# Patient Record
Sex: Female | Born: 1965 | Race: Black or African American | Hispanic: No | Marital: Single | Smoking: Never smoker
Health system: Southern US, Community
[De-identification: ages and names within clinical notes are randomized; demographics above are authoritative.]

## PROBLEM LIST (undated history)

## (undated) DIAGNOSIS — E669 Obesity, unspecified: Secondary | ICD-10-CM

## (undated) DIAGNOSIS — M1990 Unspecified osteoarthritis, unspecified site: Secondary | ICD-10-CM

## (undated) DIAGNOSIS — E039 Hypothyroidism, unspecified: Secondary | ICD-10-CM

## (undated) DIAGNOSIS — D509 Iron deficiency anemia, unspecified: Secondary | ICD-10-CM

## (undated) DIAGNOSIS — T7840XA Allergy, unspecified, initial encounter: Secondary | ICD-10-CM

## (undated) DIAGNOSIS — F329 Major depressive disorder, single episode, unspecified: Secondary | ICD-10-CM

## (undated) DIAGNOSIS — R002 Palpitations: Secondary | ICD-10-CM

## (undated) DIAGNOSIS — F32A Depression, unspecified: Secondary | ICD-10-CM

## (undated) DIAGNOSIS — G4733 Obstructive sleep apnea (adult) (pediatric): Secondary | ICD-10-CM

## (undated) DIAGNOSIS — I10 Essential (primary) hypertension: Secondary | ICD-10-CM

## (undated) DIAGNOSIS — E079 Disorder of thyroid, unspecified: Secondary | ICD-10-CM

## (undated) DIAGNOSIS — F419 Anxiety disorder, unspecified: Secondary | ICD-10-CM

## (undated) DIAGNOSIS — E119 Type 2 diabetes mellitus without complications: Secondary | ICD-10-CM

## (undated) DIAGNOSIS — I609 Nontraumatic subarachnoid hemorrhage, unspecified: Secondary | ICD-10-CM

## (undated) DIAGNOSIS — Z5189 Encounter for other specified aftercare: Secondary | ICD-10-CM

## (undated) DIAGNOSIS — E559 Vitamin D deficiency, unspecified: Secondary | ICD-10-CM

## (undated) HISTORY — DX: Obesity, unspecified: E66.9

## (undated) HISTORY — PX: BRAIN SURGERY: SHX531

## (undated) HISTORY — DX: Type 2 diabetes mellitus without complications: E11.9

## (undated) HISTORY — DX: Unspecified osteoarthritis, unspecified site: M19.90

## (undated) HISTORY — PX: ABDOMINAL HYSTERECTOMY: SHX81

## (undated) HISTORY — DX: Vitamin D deficiency, unspecified: E55.9

## (undated) HISTORY — DX: Nontraumatic subarachnoid hemorrhage, unspecified: I60.9

## (undated) HISTORY — DX: Obstructive sleep apnea (adult) (pediatric): G47.33

## (undated) HISTORY — PX: WISDOM TOOTH EXTRACTION: SHX21

## (undated) HISTORY — DX: Allergy, unspecified, initial encounter: T78.40XA

## (undated) HISTORY — DX: Palpitations: R00.2

---

## 1997-11-14 ENCOUNTER — Other Ambulatory Visit: Admission: RE | Admit: 1997-11-14 | Discharge: 1997-11-14 | Payer: Self-pay | Admitting: Obstetrics & Gynecology

## 1999-12-23 ENCOUNTER — Encounter: Payer: Self-pay | Admitting: Emergency Medicine

## 1999-12-23 ENCOUNTER — Emergency Department (HOSPITAL_COMMUNITY): Admission: EM | Admit: 1999-12-23 | Discharge: 1999-12-23 | Payer: Self-pay | Admitting: Emergency Medicine

## 2000-09-22 ENCOUNTER — Other Ambulatory Visit: Admission: RE | Admit: 2000-09-22 | Discharge: 2000-09-22 | Payer: Self-pay | Admitting: Obstetrics and Gynecology

## 2003-10-21 ENCOUNTER — Emergency Department (HOSPITAL_COMMUNITY): Admission: EM | Admit: 2003-10-21 | Discharge: 2003-10-21 | Payer: Self-pay

## 2003-11-06 ENCOUNTER — Ambulatory Visit (HOSPITAL_COMMUNITY): Admission: RE | Admit: 2003-11-06 | Discharge: 2003-11-06 | Payer: Self-pay | Admitting: Chiropractic Medicine

## 2004-05-25 ENCOUNTER — Emergency Department (HOSPITAL_COMMUNITY): Admission: EM | Admit: 2004-05-25 | Discharge: 2004-05-25 | Payer: Self-pay | Admitting: Family Medicine

## 2005-02-04 ENCOUNTER — Other Ambulatory Visit: Admission: RE | Admit: 2005-02-04 | Discharge: 2005-02-04 | Payer: Self-pay | Admitting: Obstetrics and Gynecology

## 2007-01-08 ENCOUNTER — Emergency Department (HOSPITAL_COMMUNITY): Admission: EM | Admit: 2007-01-08 | Discharge: 2007-01-08 | Payer: Self-pay | Admitting: Emergency Medicine

## 2012-06-24 ENCOUNTER — Emergency Department (HOSPITAL_COMMUNITY)
Admission: EM | Admit: 2012-06-24 | Discharge: 2012-06-24 | Disposition: A | Discharge status: Home / Self Care | Payer: BC Managed Care – PPO | Source: Home / Self Care | Attending: Emergency Medicine | Admitting: Emergency Medicine

## 2012-06-24 ENCOUNTER — Encounter (HOSPITAL_COMMUNITY): Payer: Self-pay | Admitting: Emergency Medicine

## 2012-06-24 DIAGNOSIS — J209 Acute bronchitis, unspecified: Secondary | ICD-10-CM

## 2012-06-24 HISTORY — DX: Disorder of thyroid, unspecified: E07.9

## 2012-06-24 HISTORY — DX: Essential (primary) hypertension: I10

## 2012-06-24 HISTORY — DX: Major depressive disorder, single episode, unspecified: F32.9

## 2012-06-24 HISTORY — DX: Anxiety disorder, unspecified: F41.9

## 2012-06-24 HISTORY — DX: Depression, unspecified: F32.A

## 2012-06-24 MED ORDER — ALBUTEROL SULFATE HFA 108 (90 BASE) MCG/ACT IN AERS: 1.0000 | INHALATION_SPRAY | Freq: Four times a day (QID) | RESPIRATORY_TRACT | Status: DC | PRN

## 2012-06-24 MED ORDER — AZITHROMYCIN 250 MG PO TABS: ORAL_TABLET | ORAL | Status: DC

## 2012-06-24 MED ORDER — PREDNISONE 20 MG PO TABS: 20.0000 mg | ORAL_TABLET | Freq: Two times a day (BID) | ORAL | Status: DC

## 2012-06-24 MED ORDER — HYDROCOD POLST-CHLORPHEN POLST 10-8 MG/5ML PO LQCR: 5.0000 mL | Freq: Two times a day (BID) | ORAL | Status: DC | PRN

## 2012-06-24 NOTE — ED Notes (Signed)
Pt c/o sinus pressure and pain. Post nasal drip. Dry nonproductive cough. Sneezing and sore throat. Pt denies fever, n/v/d. Pt has tried otc meds with no relief of symptoms. Symptoms present x 1 wk.

## 2012-06-24 NOTE — ED Notes (Signed)
Waiting discharge papers 

## 2012-06-24 NOTE — ED Provider Notes (Signed)
Chief Complaint  Patient presents with  . URI    cough dry nonproductive. sinus pressure and pain. sore throat.     History of Present Illness:   Amanda Henry  is a 47 year old female who presents with a one half week history of dry cough, chest tightness, rattly in the chest, nasal congestion, headache, postnasal drip, and sore throat. She denies any chest pain or wheezing. She's had no fever or chills. No GI symptoms. She has not been exposed to anything in particular. She has not tried any medications at home for symptom relief. She is allergic to penicillin. She takes thyroid medication and Diovan. She has hypothyroidism, hypertension, anxiety, and depression.  Review of Systems:  Other than noted above, the patient denies any of the following symptoms. Systemic:  No fever, chills, sweats, fatigue, myalgias, headache, or anorexia. Eye:  No redness, pain or drainage. ENT:  No earache, ear congestion, nasal congestion, sneezing, rhinorrhea, sinus pressure, sinus pain, post nasal drip, or sore throat. Lungs:  No cough, sputum production, wheezing, shortness of breath, or chest pain. GI:  No abdominal pain, nausea, vomiting, or diarrhea.  PMFSH:  Past medical history, family history, social history, meds, and allergies were reviewed.  Physical Exam:   Vital signs:  BP 166/95  Pulse 73  Temp 98.6 F (37 C) (Oral)  Resp 19  SpO2 95%  LMP 06/05/2012 General:  Alert, in no distress. Eye:  No conjunctival injection or drainage. Lids were normal. ENT:  TMs and canals were normal, without erythema or inflammation.  Nasal mucosa was clear and uncongested, without drainage.  Mucous membranes were moist.  Pharynx was clear, without exudate or drainage.  There were no oral ulcerations or lesions. Neck:  Supple, no adenopathy, tenderness or mass. Lungs:  No respiratory distress.  Lungs were clear to auscultation, without wheezes, rales or rhonchi.  Breath sounds were clear and equal bilaterally.   Heart:  Regular rhythm, without gallops, murmers or rubs. Skin:  Clear, warm, and dry, without rash or lesions.  Assessment:  The encounter diagnosis was Acute bronchitis.  Plan:   1.  The following meds were prescribed:   New Prescriptions   ALBUTEROL (PROVENTIL HFA;VENTOLIN HFA) 108 (90 BASE) MCG/ACT INHALER    Inhale 1-2 puffs into the lungs every 6 (six) hours as needed for wheezing.   AZITHROMYCIN (ZITHROMAX Z-PAK) 250 MG TABLET    Take as directed.   CHLORPHENIRAMINE-HYDROCODONE (TUSSIONEX) 10-8 MG/5ML LQCR    Take 5 mLs by mouth every 12 (twelve) hours as needed.   PREDNISONE (DELTASONE) 20 MG TABLET    Take 1 tablet (20 mg total) by mouth 2 (two) times daily.   2.  The patient was instructed in symptomatic care and handouts were given. 3.  The patient was told to return if becoming worse in any way, if no better in one week, and given some red flag symptoms that would indicate earlier return.   Reuben Likes, MD 06/24/12 [phone]

## 2012-09-06 ENCOUNTER — Encounter (HOSPITAL_COMMUNITY): Payer: Self-pay | Admitting: *Deleted

## 2012-09-06 ENCOUNTER — Emergency Department (HOSPITAL_COMMUNITY)
Admission: EM | Admit: 2012-09-06 | Discharge: 2012-09-06 | Disposition: A | Discharge status: Home / Self Care | Payer: BC Managed Care – PPO | Source: Home / Self Care | Attending: Emergency Medicine | Admitting: Emergency Medicine

## 2012-09-06 DIAGNOSIS — B079 Viral wart, unspecified: Secondary | ICD-10-CM

## 2012-09-06 MED ORDER — IBUPROFEN 800 MG PO TABS: 800.0000 mg | ORAL_TABLET | Freq: Three times a day (TID) | ORAL | Status: DC

## 2012-09-06 NOTE — ED Provider Notes (Signed)
Chief Complaint:   Chief Complaint  Patient presents with  . Abscess    History of Present Illness:   Amanda Henry is a 47 year old female who has had a one-week history of a wartlike lesion on the palm of her left hand at the base of her middle finger. This is somewhat itchy and tender to touch. She put some wart remover on the top, and it became necrotic. Ever since then it's been very friable and bleeds whenever she traumatized it.  Review of Systems:  Other than noted above, the patient denies any of the following symptoms: Systemic:  No fever, chills, sweats, weight loss, or fatigue. ENT:  No nasal congestion, rhinorrhea, sore throat, swelling of lips, tongue or throat. Resp:  No cough, wheezing, or shortness of breath. Skin:  No rash, itching, nodules, or suspicious lesions.  PMFSH:  Past medical history, family history, social history, meds, and allergies were reviewed. She is allergic to penicillin. She takes iron, Diovan, levothyroxine, alprazolam, and fluoxetine. She has high blood pressure, anemia, hypothyroidism, and anxiety.  Physical Exam:   Vital signs:  BP 173/73  Pulse 95  Temp(Src) 98.6 F (37 C) (Oral)  Resp 18  SpO2 95%  LMP 08/06/2012 Gen:  Alert, oriented, in no distress. ENT:  Pharynx clear, no intraoral lesions, moist mucous membranes. Lungs:  Clear to auscultation. Skin:  There is a 4 mm wart type lesion which is raised at the base of the middle finger and the palm of the left hand. Skin was otherwise clear.  Procedure Note:  Verbal informed consent was obtained from the patient.  Risks and benefits were outlined with the patient.  Patient understands and accepts these risks.  Identity of the patient was confirmed verbally and by armband.    Procedure was performed as follows:  The lesion was prepped with alcohol and Betadine and anesthetized with 5 mL of 2% Xylocaine without epinephrine. The wart was then shaved off even with the skin and the base was  scraped with a scalpel blade. It bled profusely. Electrocautery was used to stop the bleeding. Bleeding was controlled. Antibiotic ointment, a nonstick dressing, and a loose Coban dressing were applied. The patient was given instructions in wound care.  Patient tolerated the procedure well without any immediate complications.   Assessment:  The encounter diagnosis was Wart.  This appears to be a typical warts. She put the wart remover on it and has become somewhat necrotic.  Plan:   1.  The following meds were prescribed:   New Prescriptions   IBUPROFEN (ADVIL,MOTRIN) 800 MG TABLET    Take 1 tablet (800 mg total) by mouth 3 (three) times daily.   IBUPROFEN (ADVIL,MOTRIN) 800 MG TABLET    Take 1 tablet (800 mg total) by mouth 3 (three) times daily.   2.  The patient was instructed in symptomatic care and handouts were given. I told her I could not guarantee that this would cure the ward and if it came back she would need to see a dermatologist. She was given the name of Dr. Para Skeans. 3.  The patient was told to return if becoming worse in any way, if no better in 3 or 4 days, and given some red flag symptoms such as signs of infection that would indicate earlier return.     Reuben Likes, MD 09/06/12 [phone]

## 2012-09-06 NOTE — ED Notes (Signed)
Finger itching 1 week. It turned into a knot last Monday. Put wart remover on Wednesday.  On Thursday it started bleeding and has bled off and on since then.  Applied wart remover again today.

## 2013-05-24 ENCOUNTER — Ambulatory Visit (INDEPENDENT_AMBULATORY_CARE_PROVIDER_SITE_OTHER): Payer: BC Managed Care – PPO | Admitting: Family Medicine

## 2013-05-24 VITALS — BP 132/80 | HR 91 | Temp 98.0°F | Resp 18 | Ht 68.0 in | Wt 199.0 lb

## 2013-05-24 DIAGNOSIS — E039 Hypothyroidism, unspecified: Secondary | ICD-10-CM

## 2013-05-24 DIAGNOSIS — R5383 Other fatigue: Secondary | ICD-10-CM

## 2013-05-24 DIAGNOSIS — R079 Chest pain, unspecified: Secondary | ICD-10-CM

## 2013-05-24 DIAGNOSIS — D509 Iron deficiency anemia, unspecified: Secondary | ICD-10-CM

## 2013-05-24 DIAGNOSIS — R5381 Other malaise: Secondary | ICD-10-CM

## 2013-05-24 DIAGNOSIS — F418 Other specified anxiety disorders: Secondary | ICD-10-CM | POA: Insufficient documentation

## 2013-05-24 LAB — POCT CBC
GRANULOCYTE PERCENT: 57.1 % (ref 37–80)
HCT, POC: 33 % — AB (ref 37.7–47.9)
Hemoglobin: 9.6 g/dL — AB (ref 12.2–16.2)
Lymph, poc: 2.8 (ref 0.6–3.4)
MCH, POC: 23.1 pg — AB (ref 27–31.2)
MCHC: 29.1 g/dL — AB (ref 31.8–35.4)
MCV: 79.3 fL — AB (ref 80–97)
MID (CBC): 0.4 (ref 0–0.9)
MPV: 8.5 fL (ref 0–99.8)
PLATELET COUNT, POC: 451 10*3/uL — AB (ref 142–424)
POC Granulocyte: 4.2 (ref 2–6.9)
POC LYMPH %: 37.5 % (ref 10–50)
POC MID %: 5.4 % (ref 0–12)
RBC: 4.16 M/uL (ref 4.04–5.48)
RDW, POC: 17.1 %
WBC: 7.4 10*3/uL (ref 4.6–10.2)

## 2013-05-24 NOTE — Patient Instructions (Addendum)
.   Followup with your primary care doctor. You are anemic with a hemoglobin that is about 2-1/2 points low. Until you can see your doctor I recommend going ahead taking iron twice a day  Make sure you're getting enough rest  Continue your other routine medications.  Return if further problems we can help with

## 2013-05-24 NOTE — Progress Notes (Signed)
Subjective: 48 year old lady who has been feeling bad last couple of weeks. She's been a little congested and fatigue.  She is mostly just had the pain in the casts, left shoulder anteriorly, and back. 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 No nausea or vomiting. No diaphoresis. It's not having any nausea or vomiting.  Objective: No acute distress. Throat clear. Neck supple without nodes thyromegaly. Chest is clear to restriction. Heart regular without murmurs. Abdomen soft without mass or tenderness. No shoulder tenderness. No chest wall tenderness.  EKG: Nonspecific changes  Results for orders placed in visit on 05/24/13  POCT CBC      Result Value Range   WBC 7.4  4.6 - 10.2 K/uL   Lymph, poc 2.8  0.6 - 3.4   POC LYMPH PERCENT 37.5  10 - 50 %L   MID (cbc) 0.4  0 - 0.9   POC MID % 5.4  0 - 12 %M   POC Granulocyte 4.2  2 - 6.9   Granulocyte percent 57.1  37 - 80 %G   RBC 4.16  4.04 - 5.48 M/uL   Hemoglobin 9.6 (*) 12.2 - 16.2 g/dL   HCT, POC 33.0 (*) 37.7 - 47.9 %   MCV 79.3 (*) 80 - 97 fL   MCH, POC 23.1 (*) 27 - 31.2 pg   MCHC 29.1 (*) 31.8 - 35.4 g/dL   RDW, POC 17.1     Platelet Count, POC 451 (*) 142 - 424 K/uL   MPV 8.5  0 - 99.8 fL   She will speak to her primary care about the anemia. That has been a problem. She does take some iron on a regular basis and has been on it for a long time. I suggested she may be someone who needs her injections, and would probably have to see a hematologist for that but it depends on her primary cares desire

## 2013-07-06 ENCOUNTER — Ambulatory Visit (INDEPENDENT_AMBULATORY_CARE_PROVIDER_SITE_OTHER): Payer: BC Managed Care – PPO | Admitting: Physician Assistant

## 2013-07-06 VITALS — BP 116/80 | HR 88 | Temp 98.8°F | Resp 18 | Ht 68.0 in | Wt 195.0 lb

## 2013-07-06 DIAGNOSIS — J40 Bronchitis, not specified as acute or chronic: Secondary | ICD-10-CM

## 2013-07-06 DIAGNOSIS — R05 Cough: Secondary | ICD-10-CM

## 2013-07-06 DIAGNOSIS — R059 Cough, unspecified: Secondary | ICD-10-CM

## 2013-07-06 DIAGNOSIS — R51 Headache: Secondary | ICD-10-CM

## 2013-07-06 LAB — POCT INFLUENZA A/B
INFLUENZA B, POC: NEGATIVE
Influenza A, POC: NEGATIVE

## 2013-07-06 MED ORDER — HYDROCOD POLST-CHLORPHEN POLST 10-8 MG/5ML PO LQCR: 5.0000 mL | Freq: Two times a day (BID) | ORAL | Status: DC | PRN

## 2013-07-06 MED ORDER — AZITHROMYCIN 250 MG PO TABS: ORAL_TABLET | ORAL | Status: DC

## 2013-07-06 NOTE — Progress Notes (Signed)
Subjective:    Patient ID: Amanda Henry, female    DOB: [birth year], 48 y.o.   MRN: [id number]  HPI Primary Physician: Salena Saner., MD  Chief Complaint:   HPI: 48 y.o. female with history below presents with a 3 day history of chest congestion, nasal congestion, rhinorrhea, sore throat, post nasal drip, sinus pressure, sneezing, headache, fever, chills, and myalgias. She is uncertain of T max. Cough is not productive and not associated with time of day. No SOB or wheezing. Mild waxing and waning otalgia. Slightly muffled hearing. Generalized headache. She did have one episode of diarrhea the previous evening. 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 She did not receive the flu vaccine this year.    Past Medical History  Diagnosis Date  . Hypertension   . Thyroid disease   . Anxiety   . Depression      Home Meds: Prior to Admission medications   Medication Sig Start Date End Date Taking? Authorizing Provider  ALPRAZolam Duanne Moron) 1 MG tablet Take 1 mg by mouth at bedtime as needed.   Yes Historical Provider, MD  Fe Fum-FePoly-Vit C-Vit B3 (INTEGRA PO) Take by mouth daily.   Yes Historical Provider, MD  FLUoxetine (PROZAC) 40 MG capsule Take 40 mg by mouth daily.   Yes Historical Provider, MD  levothyroxine (SYNTHROID, LEVOTHROID) 137 MCG tablet Take 137 mcg by mouth daily.   Yes Historical Provider, MD  Valsartan (DIOVAN PO) Take by mouth.   Yes Historical Provider, MD    Allergies:  Allergies  Allergen Reactions  . Penicillins     History   Social History  . Marital Status: Single    Spouse Name: N/A    Number of Children: N/A  . Years of Education: N/A   Occupational History  . Not on file.   Social History Main Topics  . Smoking status: Never Smoker   . Smokeless tobacco: Not on file  . Alcohol Use: No  . Drug Use: No  . Sexual Activity: Yes    Birth Control/ Protection: Condom   Other Topics Concern  . Not on  file   Social History Narrative  . No narrative on file     Review of Systems  Constitutional: Positive for fever, chills, appetite change and fatigue.       Pushing fluids.   HENT: Positive for congestion, ear pain, hearing loss, postnasal drip, rhinorrhea, sinus pressure, sneezing and sore throat.        Mild otalgia.   Respiratory: Positive for cough. Negative for shortness of breath and wheezing.        Cough is not productive. Cough is not associated with time of day.    Gastrointestinal: Positive for diarrhea. Negative for nausea and vomiting.       Mild diarrhea the previous evening.   Musculoskeletal: Positive for myalgias.  Neurological: Positive for headaches.       Generalized headache.        Objective:   Physical Exam  Physical Exam: Blood pressure 116/80, pulse 88, temperature 98.8 F (37.1 C), temperature source Oral, resp. rate 18, height 5\' 8"  (1.727 m), weight 195 lb (88.451 kg), last menstrual period 07/01/2013, SpO2 99.00%., Body mass index is 29.66 kg/(m^2). General: Well developed, well nourished, in no acute distress. Head: Normocephalic, atraumatic, eyes without discharge, sclera non-icteric, nares are congested. Bilateral auditory canals clear, TM's are without perforation, pearly grey with reflective cone of light bilaterally. No sinus  TTP. Oral cavity moist, dentition normal. Posterior pharynx with post nasal drip and mild erythema. No peritonsillar abscess or tonsillar exudate. Uvula midline.  Neck: Supple. No thyromegaly. Full ROM. No lymphadenopathy. No nuchal rigidity.  Lungs: Coarse breath sounds bilaterally without wheezes, rales, or rhonchi. Breathing is unlabored.  Heart: RRR with S1 S2. No murmurs, rubs, or gallops appreciated. Msk:  Strength and tone normal for age. Extremities: No clubbing or cyanosis. No edema. Neuro: Alert and oriented X 3. Moves all extremities spontaneously. CNII-XII grossly in tact. Psych:  Responds to questions  appropriately with a normal affect.   Labs: Results for orders placed in visit on 07/06/13  POCT INFLUENZA A/B      Result Value Ref Range   Influenza A, POC Negative     Influenza B, POC Negative         Assessment & Plan:  48 year old female with bronchitis, cough, and headache -Azithromycin 250 MG #6 2 po first day then 1 po next 4 days no RF -Tussionex 1 tsp po q 12 hours prn cough #90 mL no RF  -Rest/fluids -RTC precautions   Christell Faith, MHS, PA-C [facility] [place], [place] [id number] [place] 07/06/2013 5:25 PM

## 2016-02-14 ENCOUNTER — Emergency Department (HOSPITAL_COMMUNITY)
Admission: EM | Admit: 2016-02-14 | Discharge: 2016-02-14 | Disposition: A | Discharge status: Home / Self Care | Payer: BC Managed Care – PPO | Attending: Physician Assistant | Admitting: Physician Assistant

## 2016-02-14 ENCOUNTER — Encounter (HOSPITAL_COMMUNITY): Payer: Self-pay

## 2016-02-14 ENCOUNTER — Emergency Department (HOSPITAL_COMMUNITY): Payer: BC Managed Care – PPO

## 2016-02-14 DIAGNOSIS — N921 Excessive and frequent menstruation with irregular cycle: Secondary | ICD-10-CM

## 2016-02-14 DIAGNOSIS — R42 Dizziness and giddiness: Secondary | ICD-10-CM | POA: Diagnosis present

## 2016-02-14 DIAGNOSIS — D649 Anemia, unspecified: Secondary | ICD-10-CM | POA: Insufficient documentation

## 2016-02-14 DIAGNOSIS — I10 Essential (primary) hypertension: Secondary | ICD-10-CM | POA: Diagnosis not present

## 2016-02-14 LAB — COMPREHENSIVE METABOLIC PANEL
ALT: 12 U/L — AB (ref 14–54)
AST: 18 U/L (ref 15–41)
Albumin: 3.8 g/dL (ref 3.5–5.0)
Alkaline Phosphatase: 78 U/L (ref 38–126)
Anion gap: 6 (ref 5–15)
BUN: 10 mg/dL (ref 6–20)
CALCIUM: 10.1 mg/dL (ref 8.9–10.3)
CHLORIDE: 105 mmol/L (ref 101–111)
CO2: 26 mmol/L (ref 22–32)
Creatinine, Ser: 0.67 mg/dL (ref 0.44–1.00)
GLUCOSE: 100 mg/dL — AB (ref 65–99)
POTASSIUM: 3.4 mmol/L — AB (ref 3.5–5.1)
SODIUM: 137 mmol/L (ref 135–145)
TOTAL PROTEIN: 8.4 g/dL — AB (ref 6.5–8.1)
Total Bilirubin: 0.2 mg/dL — ABNORMAL LOW (ref 0.3–1.2)

## 2016-02-14 LAB — CBC WITH DIFFERENTIAL/PLATELET
Basophils Absolute: 0.1 10*3/uL (ref 0.0–0.1)
Basophils Relative: 1 %
EOS ABS: 0.1 10*3/uL (ref 0.0–0.7)
Eosinophils Relative: 1 %
HCT: 23.7 % — ABNORMAL LOW (ref 36.0–46.0)
Hemoglobin: 6.7 g/dL — CL (ref 12.0–15.0)
LYMPHS ABS: 1.9 10*3/uL (ref 0.7–4.0)
Lymphocytes Relative: 20 %
MCH: 17 pg — AB (ref 26.0–34.0)
MCHC: 28.3 g/dL — ABNORMAL LOW (ref 30.0–36.0)
MCV: 60.2 fL — AB (ref 78.0–100.0)
MONO ABS: 0.6 10*3/uL (ref 0.1–1.0)
Monocytes Relative: 6 %
NEUTROS PCT: 72 %
Neutro Abs: 6.7 10*3/uL (ref 1.7–7.7)
PLATELETS: 710 10*3/uL — AB (ref 150–400)
RBC: 3.94 MIL/uL (ref 3.87–5.11)
RDW: 20 % — AB (ref 11.5–15.5)
WBC: 9.4 10*3/uL (ref 4.0–10.5)

## 2016-02-14 LAB — I-STAT TROPONIN, ED: Troponin i, poc: 0 ng/mL (ref 0.00–0.08)

## 2016-02-14 LAB — ABO/RH: ABO/RH(D): O POS

## 2016-02-14 LAB — PREPARE RBC (CROSSMATCH)

## 2016-02-14 MED ORDER — SODIUM CHLORIDE 0.9 % IV BOLUS (SEPSIS)
1000.0000 mL | Freq: Once | INTRAVENOUS | Status: AC
  Administered 2016-02-14: 1000 mL via INTRAVENOUS

## 2016-02-14 MED ORDER — MECLIZINE HCL 25 MG PO TABS
25.0000 mg | ORAL_TABLET | Freq: Once | ORAL | Status: AC
  Administered 2016-02-14: 25 mg via ORAL
  Filled 2016-02-14: qty 1

## 2016-02-14 MED ORDER — FERROUS SULFATE 325 (65 FE) MG PO TABS: 325.0000 mg | ORAL_TABLET | Freq: Every day | ORAL | 0 refills | Status: DC

## 2016-02-14 MED ORDER — PROMETHAZINE HCL 25 MG/ML IJ SOLN
12.5000 mg | Freq: Once | INTRAMUSCULAR | Status: AC
  Administered 2016-02-14: 12.5 mg via INTRAVENOUS
  Filled 2016-02-14: qty 1

## 2016-02-14 MED ORDER — SODIUM CHLORIDE 0.9 % IV SOLN
10.0000 mL/h | Freq: Once | INTRAVENOUS | Status: AC
  Administered 2016-02-14: 10 mL/h via INTRAVENOUS

## 2016-02-14 NOTE — ED Notes (Signed)
Blood reached the pt's IV at [place]. Delay due to initial IV pump being broken. Pump was tagged and sent to be repaired

## 2016-02-14 NOTE — Discharge Instructions (Signed)
Take iron pills daily.   Call your [facility] doctor tomorrow for follow up and recheck CBC in a week and discuss treatment options  Return to ER if you have worse vaginal bleeding, abdominal pain, passing out.

## 2016-02-14 NOTE — ED Triage Notes (Signed)
Per [facility]- Pt reports sudden onset dizziness while standing nausea afterwards however upon sitting dizziness revolved. Pt denies pale, clammy, vomiting, CP/SOB during event or after. Pt presents without complaint at present. Neuro intact. Alert and oriented x 4

## 2016-02-14 NOTE — ED Notes (Signed)
MD at bedside. EDP YAO UPDATED PT AND FAMILY ON PT CURRENT STATUS. AWARE OF TRANSFUSION

## 2016-02-14 NOTE — ED Provider Notes (Signed)
[facility] DEPT Provider Note   CSN: [id number] Arrival date & time: 02/14/16  1313     History   Chief Complaint Chief Complaint  Patient presents with  . Dizziness  . Nausea    HPI Amanda Henry is a 50 y.o. female hx of depression, HTN, hypothyroidism, Here presenting with dizziness. 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 She states intermittently she has problems with dizziness. She said sometimes she has trouble walking but most the time she just feels lightheaded and dizzy like she is on pass out. Denies any chest pain or shortness of breath. Patient came by EMS and vitals were stable en route. States that she has some headaches as well.   The history is provided by the patient.    Past Medical History:  Diagnosis Date  . Anxiety   . Depression   . Hypertension   . Thyroid disease     Patient Active Problem List   Diagnosis Date Noted  . Unspecified hypothyroidism 05/24/2013  . 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 No pertinent surgical history.  OB History    No data available       Home Medications    Prior to Admission medications   Medication Sig Start Date End Date Taking? Authorizing Provider  ALPRAZolam (XANAX XR) 1 MG 24 hr tablet Take 1 mg by mouth at bedtime as needed for anxiety or sleep.   Yes Historical Provider, MD  FLUoxetine HCl 60 MG TABS Take 60 mg by mouth daily.   Yes Historical Provider, MD  ibuprofen (ADVIL,MOTRIN) 200 MG tablet Take 200-400 mg by mouth every 6 (six) hours as needed for moderate pain or cramping.   Yes Historical Provider, MD  lamoTRIgine (LAMICTAL) 100 MG tablet Take 100 mg by mouth at bedtime. 02/05/16  Yes Historical Provider, MD  levothyroxine (SYNTHROID, LEVOTHROID) 175 MCG tablet Take 175 mcg by mouth daily before breakfast.   Yes Historical Provider, MD  Multiple Vitamin (MULTIVITAMIN WITH MINERALS) TABS  tablet Take 1 tablet by mouth daily.   Yes Historical Provider, MD    Family History Family History  Problem Relation Age of Onset  . Thyroid disease Mother   . Hypertension Mother   . Diabetes Father   . Hypertension Other   . Diabetes Other   . Thyroid disease Other   . Heart disease Other     Social History Social History  Substance Use Topics  . Smoking status: Never Smoker  . Smokeless tobacco: Never Used  . Alcohol use No     Allergies   Penicillins   Review of Systems Review of Systems  Neurological: Positive for dizziness.  All other systems reviewed and are negative.    Physical Exam Updated Vital Signs BP 145/94 (BP Location: Right Arm)   Pulse 68   Temp 98.1 F (36.7 C) (Oral)   Resp 18   LMP 01/31/2016   SpO2 100%   Physical Exam  Constitutional: She is oriented to person, place, and time. She appears well-developed.  HENT:  Head: Normocephalic.  Eyes: EOM are normal. Pupils are equal, round, and reactive to light.  No obvious nystagmus   Neck: Normal range of motion. Neck supple.  Cardiovascular: Normal rate, regular rhythm and normal heart sounds.   Pulmonary/Chest: Effort normal and breath sounds normal. No respiratory distress. She has no wheezes. She has no rales.  Abdominal: Soft. Bowel sounds  are normal. She exhibits no distension. There is no tenderness. There is no guarding.  Musculoskeletal: Normal range of motion.  Neurological: She is alert and oriented to person, place, and time. She displays normal reflexes. No cranial nerve deficit. Coordination normal.  CN 2-12 intact. Nl strength throughout. Nl gait. Nl finger to nose   Skin: Skin is warm.  Psychiatric: She has a normal mood and affect.  Nursing note and vitals reviewed.    ED Treatments / Results  Labs (all labs ordered are listed, but only abnormal results are displayed) Labs Reviewed  CBC WITH DIFFERENTIAL/PLATELET - Abnormal; Notable for the following:       Result  Value   Hemoglobin 6.7 (*)    HCT 23.7 (*)    MCV 60.2 (*)    MCH 17.0 (*)    MCHC 28.3 (*)    RDW 20.0 (*)    Platelets 710 (*)    All other components within normal limits  COMPREHENSIVE METABOLIC PANEL - Abnormal; Notable for the following:    Potassium 3.4 (*)    Glucose, Bld 100 (*)    Total Protein 8.4 (*)    ALT 12 (*)    Total Bilirubin 0.2 (*)    All other components within normal limits  I-STAT TROPOININ, ED  TYPE AND SCREEN  PREPARE RBC (CROSSMATCH)    EKG  EKG Interpretation  Date/Time:  Thursday February 14 2016 14:14:37 EDT Ventricular Rate:  61 PR Interval:    QRS Duration: 108 QT Interval:  442 QTC Calculation: 446 R Axis:   0 Text Interpretation:  Sinus rhythm No previous ECGs available Confirmed by YAO  MD, DAVID ([id number]) on 02/14/2016 2:16:26 PM       Radiology Ct Head Wo Contrast  Result Date: 02/14/2016 CLINICAL DATA:  50 year old female with sudden onset dizziness while standing followed by nausea. EXAM: CT HEAD WITHOUT CONTRAST TECHNIQUE: Contiguous axial images were obtained from the base of the skull through the vertex without intravenous contrast. COMPARISON:  None. FINDINGS: Brain: No evidence of acute infarction, hemorrhage, hydrocephalus, extra-axial collection or mass lesion/mass effect. Vascular: No hyperdense vessel. Minimal calcification left internal carotid of the cavernous sinus. Skull: Normal. Negative for fracture or focal lesion. Sinuses/Orbits: No acute finding. Other: None. IMPRESSION: No acute intracranial abnormality. Minimal atherosclerosis left internal carotid artery at the cavernous sinus. Electronically Signed   By: Ashley Royalty M.D.   On: 02/14/2016 14:14    Procedures Procedures (including critical care time)  CRITICAL CARE Performed by: Wandra Arthurs   Total critical care time: 30 minutes  Critical care time was exclusive of separately billable procedures and treating other patients.  Critical care was necessary to  treat or prevent imminent or life-threatening deterioration.  Critical care was time spent personally by me on the following activities: development of treatment plan with patient and/or surrogate as well as nursing, discussions with consultants, evaluation of patient's response to treatment, examination of patient, obtaining history from patient or surrogate, ordering and performing treatments and interventions, ordering and review of laboratory studies, ordering and review of radiographic studies, pulse oximetry and re-evaluation of patient's condition.   Medications Ordered in ED Medications  0.9 %  sodium chloride infusion (not administered)  sodium chloride 0.9 % bolus 1,000 mL (1,000 mLs Intravenous New Bag/Given 02/14/16 1428)  meclizine (ANTIVERT) tablet 25 mg (25 mg Oral Given 02/14/16 1428)  promethazine (PHENERGAN) injection 12.5 mg (12.5 mg Intravenous Given 02/14/16 1428)     Initial Impression /  Assessment and Plan / ED Course  I have reviewed the triage vital signs and the nursing notes.  Pertinent labs & imaging results that were available during my care of the patient were reviewed by me and considered in my medical decision making (see chart for details).  Clinical Course    Taquana Wachsman is a 50 y.o. female here with headache, nausea, dizziness, light headedness. Consider migraines vs dehydration vs vertigo. Symptoms for several months, worse today. Nl neuro exam. Will get labs, CT head (r/o mass), EKG, orthostatics. Will give IVF and reassess.   3:23 PM Hg 6.7. Baseline around 10. She is not orthostatic but she is symptomatic from it. She states that she has heavy menses last week and had 3 pads daily but menses stopped several days ago. She follows up with Dr. Marvel Plan from [facility]. I discussed with Dr. Terri Piedra from that practice. She recommend 1 U PRBC transfusion and iron pills. She wants patient to call to make appointment. Counseled regarding risks and  benefits of transfusion.   3:50 PM Signed out to Dr. Thomasene Lot in the ED to observe patient during transfusion. If no reactions, can dc home afterwards with OB follow up for definitive treatment    Final Clinical Impressions(s) / ED Diagnoses   Final diagnoses:  None    New Prescriptions New Prescriptions   No medications on file     Drenda Freeze, MD 02/14/16 1551

## 2016-02-14 NOTE — ED Notes (Signed)
Pt would like to speak with EDP prior to signing consent for transfusion

## 2016-02-14 NOTE — ED Notes (Signed)
MD at bedside. EDP Darl Householder

## 2016-02-15 LAB — TYPE AND SCREEN
ABO/RH(D): O POS
ANTIBODY SCREEN: NEGATIVE
UNIT DIVISION: 0

## 2016-04-21 ENCOUNTER — Encounter: Payer: Self-pay | Admitting: Hematology & Oncology

## 2016-04-21 ENCOUNTER — Other Ambulatory Visit (HOSPITAL_BASED_OUTPATIENT_CLINIC_OR_DEPARTMENT_OTHER): Payer: BC Managed Care – PPO

## 2016-04-21 ENCOUNTER — Ambulatory Visit (HOSPITAL_BASED_OUTPATIENT_CLINIC_OR_DEPARTMENT_OTHER): Payer: BC Managed Care – PPO | Admitting: Hematology & Oncology

## 2016-04-21 ENCOUNTER — Ambulatory Visit: Payer: BC Managed Care – PPO

## 2016-04-21 VITALS — BP 135/87 | HR 76 | Temp 98.6°F | Wt 197.7 lb

## 2016-04-21 DIAGNOSIS — N950 Postmenopausal bleeding: Secondary | ICD-10-CM | POA: Diagnosis not present

## 2016-04-21 DIAGNOSIS — D509 Iron deficiency anemia, unspecified: Secondary | ICD-10-CM

## 2016-04-21 DIAGNOSIS — D500 Iron deficiency anemia secondary to blood loss (chronic): Secondary | ICD-10-CM | POA: Diagnosis not present

## 2016-04-21 LAB — COMPREHENSIVE METABOLIC PANEL
ALBUMIN: 3.5 g/dL (ref 3.5–5.0)
ALK PHOS: 120 U/L (ref 40–150)
ALT: 11 U/L (ref 0–55)
AST: 13 U/L (ref 5–34)
Anion Gap: 8 mEq/L (ref 3–11)
BUN: 10.8 mg/dL (ref 7.0–26.0)
CO2: 23 meq/L (ref 22–29)
Calcium: 10.4 mg/dL (ref 8.4–10.4)
Chloride: 107 mEq/L (ref 98–109)
Creatinine: 0.8 mg/dL (ref 0.6–1.1)
GLUCOSE: 117 mg/dL (ref 70–140)
POTASSIUM: 3.6 meq/L (ref 3.5–5.1)
SODIUM: 138 meq/L (ref 136–145)
Total Bilirubin: <0.22 mg/dL (ref 0.20–1.20)
Total Protein: 8.2 g/dL (ref 6.4–8.3)

## 2016-04-21 LAB — CBC WITH DIFFERENTIAL (CANCER CENTER ONLY)
BASO#: 0 10*3/uL (ref 0.0–0.2)
BASO%: 0.3 % (ref 0.0–2.0)
EOS%: 2.9 % (ref 0.0–7.0)
Eosinophils Absolute: 0.2 10*3/uL (ref 0.0–0.5)
HEMATOCRIT: 34 % — AB (ref 34.8–46.6)
HEMOGLOBIN: 10.6 g/dL — AB (ref 11.6–15.9)
LYMPH#: 2.1 10*3/uL (ref 0.9–3.3)
LYMPH%: 32 % (ref 14.0–48.0)
MCH: 25.9 pg — ABNORMAL LOW (ref 26.0–34.0)
MCHC: 31.2 g/dL — ABNORMAL LOW (ref 32.0–36.0)
MCV: 83 fL (ref 81–101)
MONO#: 0.5 10*3/uL (ref 0.1–0.9)
MONO%: 7.2 % (ref 0.0–13.0)
NEUT%: 57.6 % (ref 39.6–80.0)
NEUTROS ABS: 3.7 10*3/uL (ref 1.5–6.5)
Platelets: 438 10*3/uL — ABNORMAL HIGH (ref 145–400)
RBC: 4.1 10*6/uL (ref 3.70–5.32)
RDW: 21 % — AB (ref 11.1–15.7)
WBC: 6.5 10*3/uL (ref 3.9–10.0)

## 2016-04-21 LAB — CHCC SATELLITE - SMEAR

## 2016-04-21 NOTE — Progress Notes (Signed)
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 Follow-up  : I am tired all the time.  HPI: [name] is a very charming 50 year old African-American female. Surprisingly enough, she actually went [place] with my sister-in-law. She had some funny stories to tell.  She is followed by Dr. Glendale Chard. She also is followed by Dr. Marvel Plan of gynecology. She has heavy monthly cycles. Because of this, she becomes iron deficient. She has been on oral iron in the past but every now and then she stops taking this because she feels better.  Back in September, a CBC was done which showed a white cell count of 9.4. Hemoglobin 6.7 with a hematocrit of 23.7. Platelet count 710,000. Her MCV was 60. She got transfused back in late September.  Her iron studies that have been done in the past showed marked iron deficiency. She also has some thyroid issues. She has had this adjusted.  She is on oral iron right now. She still feels very tired.  She says that her cycles are quite heavy. She says Dr. Marvel Plan has talked to her about the possibility of a hysterectomy.  She does chew ice. She has not noted bleeding otherwise. She has no sickle cell.  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  Her mother is anemic. This was because of heavy cycles.  She does not smoke. She really does not drink. She has one child who was born 3 months premature. He is doing quite well right now.  She has no rashes. She has no leg swelling. There's been no issues with weight loss or weight gain.  Overall, her performance status is ECOG 1.    Past Medical History:  Diagnosis Date  . Anxiety   . Depression   . Hypertension   . Thyroid disease   :  History reviewed. No pertinent surgical history.:   Current Outpatient Prescriptions:  .  ALPRAZolam (XANAX XR) 1 MG 24 hr  tablet, Take 1 mg by mouth at bedtime as needed for anxiety or sleep., Disp: , Rfl:  .  ferrous sulfate 325 (65 FE) MG tablet, Take 1 tablet (325 mg total) by mouth daily., Disp: 30 tablet, Rfl: 0 .  FLUoxetine HCl 60 MG TABS, Take 60 mg by mouth daily., Disp: , Rfl:  .  ibuprofen (ADVIL,MOTRIN) 200 MG tablet, Take 200-400 mg by mouth every 6 (six) hours as needed for moderate pain or cramping., Disp: , Rfl:  .  lamoTRIgine (LAMICTAL) 100 MG tablet, Take 100 mg by mouth at bedtime., Disp: , Rfl:  .  levothyroxine (SYNTHROID, LEVOTHROID) 175 MCG tablet, Take 175 mcg by mouth daily before breakfast., Disp: , Rfl:  .  Multiple Vitamin (MULTIVITAMIN WITH MINERALS) TABS tablet, Take 1 tablet by mouth daily., Disp: , Rfl: :  :  Allergies  Allergen Reactions  . Penicillins Rash    Has patient had a PCN reaction causing immediate rash, facial/tongue/throat swelling, SOB or lightheadedness with hypotension: Yes Has patient had a PCN reaction causing severe rash involving mucus membranes or skin necrosis: No Has patient had a PCN reaction that required hospitalization No Has patient had a PCN reaction occurring within the last 10 years: No If all of the above answers are "NO", then may proceed with Cephalosporin use.   :  Family History  Problem Relation Age of Onset  .  Thyroid disease Mother   . Hypertension Mother   . Diabetes Father   . Hypertension Other   . Diabetes Other   . Thyroid disease Other   . Heart disease Other   :  Social History   Social History  . Marital status: Single    Spouse name: N/A  . Number of children: N/A  . Years of education: N/A   Occupational History  . Not on file.   Social History Main Topics  . Smoking status: Never Smoker  . Smokeless tobacco: Never Used  . Alcohol use No  . Drug use: No  . Sexual activity: Yes    Birth control/ protection: Condom   Other Topics Concern  . Not on file   Social History Narrative  . No narrative on file   :  Pertinent items are noted in HPI.  Exam: @IPVITALS @ Well-developed and well-nourished Afro-American female in no obvious distress. Vital signs show a temperature of 98.6. Pulse 76. Blood pressure 135/87. Weight is 197 pounds. Head and neck exam shows no ocular or oral lesions. She has no palpable cervical or supraclavicular lymph nodes. Conjunctiva are slightly pale. Thyroid is nonpalpable. Lungs are clear bilaterally. Cardiac exam regular rate and rhythm with no murmurs, rubs or bruits. Abdomen is soft. She has good bowel sounds. There is no fluid wave. There is no palpable liver or spleen tip. Back exam shows no tenderness over the spine, ribs or hips. Extremities shows no clubbing, cyanosis or edema. Neurological exam shows focal neurological deficits. Skin exam shows no rashes, ecchymoses or petechia.    Recent Labs  04/21/16 1417  WBC 6.5  HGB 10.6*  HCT 34.0*  PLT 438*   No results for input(s): NA, K, CL, CO2, GLUCOSE, BUN, CREATININE, CALCIUM in the last 72 hours.  Blood smear review:  Mild anisocytosis and poikilocytosis. She has microcytic red blood cells. She has no target cells. I see no nucleated red blood cells. She has no teardrop cells. White cells are normal in morphology and maturation. She has no hypersegmented polys. She has no immature myeloid or lymphoid forms. Platelets are adequate in number and size. Platelets are small and well granulated.  Pathology: None     Assessment and Plan:  [name] is a very charming 50 year old Afro-American female with anemia. I had to believe this is iron deficiency anemia. She has recurrent iron deficiency from her menometrorrhagia.  She does have a little bit of a hard time with oral iron. We'll see how iron deficient she is and then consider for IV iron. I know this will work quite well.  I don't think she has a hemoglobinopathy. There is no sickle cell the family. I do not see anything on her blood smear that looks like  thalassemia.  I think that IV iron work very nicely for her. She wants to feel better. IV iron will definitely get her up to where she needs to be.  I spent about 45 minutes with her. She is very nice. She is very astute. She knows her body well.  I probably will get her back in about 6 weeks. We will see what the iron levels show.

## 2016-04-22 LAB — HEMOGLOBINOPATHY EVALUATION
HEMOGLOBIN A2 QUANTITATION: 1.9 % (ref 0.7–3.1)
HEMOGLOBIN F QUANTITATION: 0 % (ref 0.0–2.0)
HGB A: 98.1 % — AB (ref 94.0–98.0)
HGB C: 0 %
HGB S: 0 %

## 2016-04-22 LAB — FERRITIN: FERRITIN: 12 ng/mL (ref 9–269)

## 2016-04-22 LAB — ERYTHROPOIETIN: ERYTHROPOIETIN: 19.5 m[IU]/mL — AB (ref 2.6–18.5)

## 2016-04-22 LAB — IRON AND TIBC
%SAT: 7 % — AB (ref 21–57)
IRON: 28 ug/dL — AB (ref 41–142)
TIBC: 414 ug/dL (ref 236–444)
UIBC: 386 ug/dL — AB (ref 120–384)

## 2016-04-22 LAB — RETICULOCYTES: Reticulocyte Count: 1.8 % (ref 0.6–2.6)

## 2016-04-22 LAB — LACTATE DEHYDROGENASE: LDH: 165 U/L (ref 125–245)

## 2016-04-29 ENCOUNTER — Telehealth: Payer: Self-pay | Admitting: *Deleted

## 2016-04-29 NOTE — Telephone Encounter (Addendum)
Attempted multiple times over a few days to have patient call back. Called and left messages on all three numbers.   Left message on home phone stating that lab values were low and the patient needs to call the office to schedule feraheme infusion. Will not make further attempts to contact patient.   ----- Message from Volanda Napoleon, MD sent at 04/22/2016  1:43 PM EST ----- Call - your iron level is still very low!!  We can give you 1 dose of IV iron -- please set this up!!

## 2016-04-30 ENCOUNTER — Encounter (HOSPITAL_COMMUNITY): Payer: Self-pay | Admitting: Emergency Medicine

## 2016-04-30 DIAGNOSIS — I10 Essential (primary) hypertension: Secondary | ICD-10-CM | POA: Insufficient documentation

## 2016-04-30 DIAGNOSIS — E039 Hypothyroidism, unspecified: Secondary | ICD-10-CM | POA: Diagnosis not present

## 2016-04-30 DIAGNOSIS — D649 Anemia, unspecified: Secondary | ICD-10-CM | POA: Diagnosis not present

## 2016-04-30 DIAGNOSIS — R5383 Other fatigue: Secondary | ICD-10-CM | POA: Diagnosis present

## 2016-04-30 NOTE — ED Triage Notes (Signed)
Pt states that she has a hx of iron deficiency anemia and has been feeling sluggish and sleepy x 3 days. Got blood work back today and her iron was low. Pt called the hematologist and explained that she did not feel any better and was told to come in. Alert and oriented.

## 2016-05-01 ENCOUNTER — Emergency Department (HOSPITAL_COMMUNITY)
Admission: EM | Admit: 2016-05-01 | Discharge: 2016-05-01 | Disposition: A | Discharge status: Home / Self Care | Payer: BC Managed Care – PPO | Attending: Emergency Medicine | Admitting: Emergency Medicine

## 2016-05-01 DIAGNOSIS — D649 Anemia, unspecified: Secondary | ICD-10-CM

## 2016-05-01 LAB — SAMPLE TO BLOOD BANK

## 2016-05-01 LAB — CBC
HEMATOCRIT: 26.6 % — AB (ref 36.0–46.0)
HEMOGLOBIN: 8.2 g/dL — AB (ref 12.0–15.0)
MCH: 25 pg — ABNORMAL LOW (ref 26.0–34.0)
MCHC: 30.8 g/dL (ref 30.0–36.0)
MCV: 81.1 fL (ref 78.0–100.0)
Platelets: 567 10*3/uL — ABNORMAL HIGH (ref 150–400)
RBC: 3.28 MIL/uL — AB (ref 3.87–5.11)
RDW: 19.3 % — ABNORMAL HIGH (ref 11.5–15.5)
WBC: 9.9 10*3/uL (ref 4.0–10.5)

## 2016-05-01 NOTE — Discharge Instructions (Signed)
Continue taking your iron tablets. Follow-up with your hematologist regarding your visit today. You may return for new or concerning symptoms.

## 2016-05-01 NOTE — ED Provider Notes (Signed)
[facility] DEPT Provider Note   CSN: [id number] Arrival date & time: 04/30/16  1935  By signing my name below, I, Amanda Henry, attest that this documentation has been prepared under the direction and in the presence of [employer], PA-C. Electronically Signed: Judithe Henry, ER Scribe. 12/29/2015. 1:44 AM.   History   Chief Complaint Chief Complaint  Patient presents with  . Abnormal Lab   HPI  HPI Comments: Amanda Henry is a 50 y.o. female who presents to the Emergency Department reporting to the ED due to low iron and three days of fatigue. She had a blood transfusion 02/14/16 due to extremely low hemoglobin levels. She is followed by a hematologist who determined that her blood loss was from due to heavy menstrual cycles. She takes PO iron daily. She denies SOB, black or bloody stools, syncope, or lightheadedness. Her LKMP ended three days ago. It lasted 4 days and was heavy, per her normal menses.   Past Medical History:  Diagnosis Date  . Anxiety   . Depression   . Hypertension   . Thyroid disease     Patient Active Problem List   Diagnosis Date Noted  . Unspecified hypothyroidism 05/24/2013  . 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 No pertinent surgical history.  OB History    No data available       Home Medications    Prior to Admission medications   Medication Sig Start Date End Date Taking? Authorizing Provider  ALPRAZolam (XANAX XR) 1 MG 24 hr tablet Take 1 mg by mouth at bedtime as needed for anxiety or sleep.    Historical Provider, MD  ferrous sulfate 325 (65 FE) MG tablet Take 1 tablet (325 mg total) by mouth daily. 02/14/16   Drenda Freeze, MD  FLUoxetine HCl 60 MG TABS Take 60 mg by mouth daily.    Historical Provider, MD  ibuprofen (ADVIL,MOTRIN) 200 MG tablet Take 200-400 mg by mouth every 6 (six) hours as needed for moderate pain or cramping.    Historical Provider, MD  lamoTRIgine (LAMICTAL) 100 MG tablet Take  100 mg by mouth at bedtime. 02/05/16   Historical Provider, MD  levothyroxine (SYNTHROID, LEVOTHROID) 175 MCG tablet Take 175 mcg by mouth daily before breakfast.    Historical Provider, MD  Multiple Vitamin (MULTIVITAMIN WITH MINERALS) TABS tablet Take 1 tablet by mouth daily.    Historical Provider, MD    Family History Family History  Problem Relation Age of Onset  . Thyroid disease Mother   . Hypertension Mother   . Diabetes Father   . Hypertension Other   . Diabetes Other   . Thyroid disease Other   . Heart disease Other     Social History Social History  Substance Use Topics  . Smoking status: Never Smoker  . Smokeless tobacco: Never Used  . Alcohol use No     Allergies   Penicillins   Review of Systems Review of Systems A complete 10 system review of systems was obtained and all systems are negative except as noted in the HPI and PMH.    Physical Exam Updated Vital Signs BP 164/100 (BP Location: Left Arm)   Pulse 87   Temp 98.9 F (37.2 C) (Oral)   Resp 18   Ht 5\' 7"  (1.702 m)   Wt 207 lb 1 oz (93.9 kg)   LMP 04/23/2016   SpO2 99%   BMI 32.43 kg/m   Physical Exam  Constitutional: She is  oriented to person, place, and time. She appears well-developed and well-nourished. No distress.  Nontoxic and in NAD  HENT:  Head: Normocephalic and atraumatic.  Eyes: Conjunctivae and EOM are normal. No scleral icterus.  Neck: Normal range of motion.  Cardiovascular: Normal rate, regular rhythm and intact distal pulses.   Pulmonary/Chest: Effort normal. No respiratory distress. She has no wheezes.  Respirations even and unlabored  Musculoskeletal: Normal range of motion.  Neurological: She is alert and oriented to person, place, and time. She exhibits normal muscle tone. Coordination normal.  Ambulatory with steady gait.  Skin: Skin is warm and dry. No rash noted. She is not diaphoretic. No erythema.  Psychiatric: She has a normal mood and affect. Her behavior is  normal.  Nursing note and vitals reviewed.    ED Treatments / Results  Labs (all labs ordered are listed, but only abnormal results are displayed) Labs Reviewed  CBC - Abnormal; Notable for the following:       Result Value   RBC 3.28 (*)    Hemoglobin 8.2 (*)    HCT 26.6 (*)    MCH 25.0 (*)    RDW 19.3 (*)    Platelets 567 (*)    All other components within normal limits  POC OCCULT BLOOD, ED  SAMPLE TO BLOOD BANK    EKG  EKG Interpretation None       Radiology No results found.  Procedures Procedures (including critical care time)  Medications Ordered in ED Medications - No data to display   Initial Impression / Assessment and Plan / ED Course  I have reviewed the triage vital signs and the nursing notes.  Pertinent labs & imaging results that were available during my care of the patient were reviewed by me and considered in my medical decision making (see chart for details).  Clinical Course     50 y/o female with a hx of anemia requiring transfusion in September presents for 3 days of fatigue.She states that her anemia was found to be secondary to her heavy menstrual cycle. She has been followed by a hematologist and taking iron tablets regularly patient just finished her menstrual cycle 3 days ago, when her symptoms began. She reports a menstrual cycle lasting 4 days which was extremely heavy; soaking through 7-8 pads per day. Patient denies shortness of breath, lightheadedness, black or tarry stool, and syncope. She has stable orthostatic vital signs in the emergency department. Patient is persistently anemic with a drop of just over 2 g in her hemoglobin level. Baseline likely around 9.6 given results from January 2015. The patient is only 1.4 points away from this baseline.  While the patient does require follow-up with her hematologist regarding her symptoms, I do not believe she is in need of emergent transfusion. She has no signs of acute blood loss at this  time; no lightheadedness, hypotension, or tachycardia. Acute change in hemoglobin level likely attributed to her recent menses. I anticipate this will improve with time and continued use of iron tablets. The patient has been advised to follow-up with her hematologist. I do not believe further emergent workup is indicated. Return precautions discussed and provided. Patient discharged in stable condition with no unaddressed concerns.   Vitals:   04/30/16 1946 04/30/16 2317 05/01/16 0150  BP: 160/95 164/100 180/87  Pulse: 93 87 87  Resp: 18 18 18   Temp: 98.9 F (37.2 C)    TempSrc: Oral    SpO2: 99% 99% 94%  Weight: 93.9 kg  Height: 5\' 7"  (1.702 m)       Final Clinical Impressions(s) / ED Diagnoses   Final diagnoses:  Symptomatic anemia    New Prescriptions New Prescriptions   No medications on file    I personally performed the services described in this documentation, which was scribed in my presence. The recorded information has been reviewed and is accurate.          Antonietta Breach, PA-C 05/01/16 2049    Charlesetta Shanks, MD 05/10/16 [phone]

## 2016-05-06 ENCOUNTER — Ambulatory Visit (HOSPITAL_BASED_OUTPATIENT_CLINIC_OR_DEPARTMENT_OTHER): Payer: BC Managed Care – PPO

## 2016-05-06 ENCOUNTER — Other Ambulatory Visit: Payer: Self-pay | Admitting: Family

## 2016-05-06 VITALS — BP 149/71 | HR 84 | Temp 97.5°F | Resp 18

## 2016-05-06 DIAGNOSIS — D500 Iron deficiency anemia secondary to blood loss (chronic): Secondary | ICD-10-CM

## 2016-05-06 DIAGNOSIS — N950 Postmenopausal bleeding: Secondary | ICD-10-CM

## 2016-05-06 DIAGNOSIS — D509 Iron deficiency anemia, unspecified: Secondary | ICD-10-CM

## 2016-05-06 MED ORDER — SODIUM CHLORIDE 0.9 % IV SOLN
Freq: Once | INTRAVENOUS | Status: AC
  Administered 2016-05-06: 12:00:00 via INTRAVENOUS

## 2016-05-06 MED ORDER — SODIUM CHLORIDE 0.9 % IV SOLN
510.0000 mg | Freq: Once | INTRAVENOUS | Status: AC
  Administered 2016-05-06: 510 mg via INTRAVENOUS
  Filled 2016-05-06: qty 17

## 2016-05-06 NOTE — Patient Instructions (Signed)
Ferumoxytol injection What is this medicine? FERUMOXYTOL is an iron complex. Iron is used to make healthy red blood cells, which carry oxygen and nutrients throughout the body. This medicine is used to treat iron deficiency anemia in people with chronic kidney disease. COMMON BRAND NAME(S): Feraheme What should I tell my health care provider before I take this medicine? They need to know if you have any of these conditions: -anemia not caused by low iron levels -high levels of iron in the blood -magnetic resonance imaging (MRI) test scheduled -an unusual or allergic reaction to iron, other medicines, foods, dyes, or preservatives -pregnant or trying to get pregnant -breast-feeding How should I use this medicine? This medicine is for injection into a vein. It is given by a health care professional in a hospital or clinic setting. Talk to your pediatrician regarding the use of this medicine in children. Special care may be needed. What if I miss a dose? It is important not to miss your dose. Call your doctor or health care professional if you are unable to keep an appointment. What may interact with this medicine? This medicine may interact with the following medications: -other iron products What should I watch for while using this medicine? Visit your doctor or healthcare professional regularly. Tell your doctor or healthcare professional if your symptoms do not start to get better or if they get worse. You may need blood work done while you are taking this medicine. You may need to follow a special diet. Talk to your doctor. Foods that contain iron include: whole grains/cereals, dried fruits, beans, or peas, leafy green vegetables, and organ meats (liver, kidney). What side effects may I notice from receiving this medicine? Side effects that you should report to your doctor or health care professional as soon as possible: -allergic reactions like skin rash, itching or hives, swelling of the  face, lips, or tongue -breathing problems -changes in blood pressure -feeling faint or lightheaded, falls -fever or chills -flushing, sweating, or hot feelings -swelling of the ankles or feet Side effects that usually do not require medical attention (report to your doctor or health care professional if they continue or are bothersome): -diarrhea -headache -nausea, vomiting -stomach pain Where should I keep my medicine? This drug is given in a hospital or clinic and will not be stored at home.  2017 Elsevier/Gold Standard (2015-06-07 12:41:49)  

## 2016-05-27 ENCOUNTER — Telehealth: Payer: Self-pay | Admitting: *Deleted

## 2016-05-27 ENCOUNTER — Other Ambulatory Visit: Payer: Self-pay | Admitting: *Deleted

## 2016-05-27 DIAGNOSIS — D509 Iron deficiency anemia, unspecified: Secondary | ICD-10-CM

## 2016-05-27 NOTE — Telephone Encounter (Signed)
PAtient called stating that she has had a dizzy spell where she felt she was going to pass out and has been feeling bad as if her iron levels were low.  Patient scheduled to come in tomorrow for lab and to see Judson Roch.

## 2016-05-28 ENCOUNTER — Other Ambulatory Visit (HOSPITAL_BASED_OUTPATIENT_CLINIC_OR_DEPARTMENT_OTHER): Payer: BC Managed Care – PPO

## 2016-05-28 ENCOUNTER — Ambulatory Visit (HOSPITAL_BASED_OUTPATIENT_CLINIC_OR_DEPARTMENT_OTHER): Payer: BC Managed Care – PPO | Admitting: Family

## 2016-05-28 ENCOUNTER — Encounter: Payer: Self-pay | Admitting: *Deleted

## 2016-05-28 VITALS — BP 148/76 | HR 94 | Temp 98.2°F | Resp 18 | Wt 202.0 lb

## 2016-05-28 DIAGNOSIS — D509 Iron deficiency anemia, unspecified: Secondary | ICD-10-CM

## 2016-05-28 DIAGNOSIS — D500 Iron deficiency anemia secondary to blood loss (chronic): Secondary | ICD-10-CM | POA: Diagnosis not present

## 2016-05-28 DIAGNOSIS — N920 Excessive and frequent menstruation with regular cycle: Secondary | ICD-10-CM | POA: Diagnosis not present

## 2016-05-28 LAB — CBC WITH DIFFERENTIAL (CANCER CENTER ONLY)
BASO#: 0 10*3/uL (ref 0.0–0.2)
BASO%: 0.4 % (ref 0.0–2.0)
EOS ABS: 0.2 10*3/uL (ref 0.0–0.5)
EOS%: 2.6 % (ref 0.0–7.0)
HEMATOCRIT: 28.6 % — AB (ref 34.8–46.6)
HGB: 8.7 g/dL — ABNORMAL LOW (ref 11.6–15.9)
LYMPH#: 2.5 10*3/uL (ref 0.9–3.3)
LYMPH%: 36.3 % (ref 14.0–48.0)
MCH: 25.4 pg — AB (ref 26.0–34.0)
MCHC: 30.4 g/dL — ABNORMAL LOW (ref 32.0–36.0)
MCV: 83 fL (ref 81–101)
MONO#: 0.6 10*3/uL (ref 0.1–0.9)
MONO%: 8.8 % (ref 0.0–13.0)
NEUT#: 3.6 10*3/uL (ref 1.5–6.5)
NEUT%: 51.9 % (ref 39.6–80.0)
PLATELETS: 570 10*3/uL — AB (ref 145–400)
RBC: 3.43 10*6/uL — ABNORMAL LOW (ref 3.70–5.32)
RDW: 17.1 % — AB (ref 11.1–15.7)
WBC: 6.9 10*3/uL (ref 3.9–10.0)

## 2016-05-28 LAB — CMP (CANCER CENTER ONLY)
ALK PHOS: 76 U/L (ref 26–84)
ALT: 23 U/L (ref 10–47)
AST: 21 U/L (ref 11–38)
Albumin: 3.3 g/dL (ref 3.3–5.5)
BUN: 8 mg/dL (ref 7–22)
CHLORIDE: 104 meq/L (ref 98–108)
CO2: 28 mEq/L (ref 18–33)
Calcium: 10.5 mg/dL — ABNORMAL HIGH (ref 8.0–10.3)
Creat: 0.8 mg/dl (ref 0.6–1.2)
GLUCOSE: 95 mg/dL (ref 73–118)
POTASSIUM: 3.6 meq/L (ref 3.3–4.7)
Sodium: 138 mEq/L (ref 128–145)
TOTAL PROTEIN: 7.5 g/dL (ref 6.4–8.1)
Total Bilirubin: 0.5 mg/dl (ref 0.20–1.60)

## 2016-05-28 NOTE — Progress Notes (Signed)
Hematology and Oncology Follow Up Visit  Amanda Henry [id number] 11-18-1965 51 y.o. 05/28/2016   Principle Diagnosis:  Iron deficiency anemia secondary to menorrhagia   Current Therapy:   IV iron as indicated    Interim History:  Amanda Henry is here today for a follow-up. She received one dose of Feraheme in December for an iron saturation if 7%. Her cycles are still quite heavy and regular. She has an appointment with her gynecologist next week and is hoping to schedule her hysterectomy at that time.  Hgb is down at 8.7 with an MCV of 83. Platelet count is 570. Iron studies are pending.  No other bleeding, bruising or petechiae. No lymphadenopathy found on exam.  She is symptomatic with fatigue, weakness, dizziness, "brain fog",and ringing in her ears. No fever, chills, n/v, cough, rash, SOB, chest pain, palpitations, abdominal pain or changes in bowel or bladder habits.  No swelling, tenderness, numbness or tingling in her extremities. No new aches or pains.  She has maintained a good appetite and is staying well hydrated. Her weight is stable.   Medications:  Allergies as of 05/28/2016      Reactions   Penicillins Rash   Has patient had a PCN reaction causing immediate rash, facial/tongue/throat swelling, SOB or lightheadedness with hypotension: Yes Has patient had a PCN reaction causing severe rash involving mucus membranes or skin necrosis: No Has patient had a PCN reaction that required hospitalization No Has patient had a PCN reaction occurring within the last 10 years: No If all of the above answers are "NO", then may proceed with Cephalosporin use.      Medication List       Accurate as of 05/28/16  3:43 PM. Always use your most recent med list.          ALPRAZolam 1 MG 24 hr tablet Commonly known as:  XANAX XR Take 1 mg by mouth at bedtime as needed for anxiety or sleep.   FLUoxetine HCl 60 MG Tabs Take 60 mg by mouth daily.   ibuprofen 200 MG  tablet Commonly known as:  ADVIL,MOTRIN Take 200-400 mg by mouth every 6 (six) hours as needed for moderate pain or cramping.   lamoTRIgine 100 MG tablet Commonly known as:  LAMICTAL Take 100 mg by mouth at bedtime.   levothyroxine 175 MCG tablet Commonly known as:  SYNTHROID, LEVOTHROID Take 175 mcg by mouth daily before breakfast.   multivitamin with minerals Tabs tablet Take 1 tablet by mouth daily.       Allergies:  Allergies  Allergen Reactions  . Penicillins Rash    Has patient had a PCN reaction causing immediate rash, facial/tongue/throat swelling, SOB or lightheadedness with hypotension: Yes Has patient had a PCN reaction causing severe rash involving mucus membranes or skin necrosis: No Has patient had a PCN reaction that required hospitalization No Has patient had a PCN reaction occurring within the last 10 years: No If all of the above answers are "NO", then may proceed with Cephalosporin use.     Past Medical History, Surgical history, Social history, and Family History were reviewed and updated.  Review of Systems: All other 10 point review of systems is negative.   Physical Exam:  weight is 202 lb (91.6 kg). Her oral temperature is 98.2 F (36.8 C). Her blood pressure is 148/76 (abnormal) and her pulse is 94. Her respiration is 18 and oxygen saturation is 99%.   Wt Readings from Last 3 Encounters:  05/28/16 202 lb (91.6  kg)  04/30/16 207 lb 1 oz (93.9 kg)  04/21/16 197 lb 11.2 oz (89.7 kg)    Ocular: Sclerae unicteric, pupils equal, round and reactive to light Ear-nose-throat: Oropharynx clear, dentition fair Lymphatic: No cervical supraclavicular or axillary adenopathy Lungs no rales or rhonchi, good excursion bilaterally Heart regular rate and rhythm, no murmur appreciated Abd soft, nontender, positive bowel sounds, no liver or spleen tip palpated on exam, no fluid wave MSK no focal spinal tenderness, no joint edema Neuro: non-focal, well-oriented,  appropriate affect Breasts: Deferred  Lab Results  Component Value Date   WBC 6.9 05/28/2016   HGB 8.7 (L) 05/28/2016   HCT 28.6 (L) 05/28/2016   MCV 83 05/28/2016   PLT 570 (H) 05/28/2016   Lab Results  Component Value Date   FERRITIN 12 04/21/2016   IRON 28 (L) 04/21/2016   TIBC 414 04/21/2016   UIBC 386 (H) 04/21/2016   IRONPCTSAT 7 (L) 04/21/2016   Lab Results  Component Value Date   RBC 3.43 (L) 05/28/2016   No results found for: KPAFRELGTCHN, LAMBDASER, KAPLAMBRATIO No results found for: IGGSERUM, IGA, IGMSERUM No results found for: Odetta Pink, SPEI   Chemistry      Component Value Date/Time   NA 138 04/21/2016 1417   K 3.6 04/21/2016 1417   CL 105 02/14/2016 1413   CO2 23 04/21/2016 1417   BUN 10.8 04/21/2016 1417   CREATININE 0.8 04/21/2016 1417      Component Value Date/Time   CALCIUM 10.4 04/21/2016 1417   ALKPHOS 120 04/21/2016 1417   AST 13 04/21/2016 1417   ALT 11 04/21/2016 1417   BILITOT <0.22 04/21/2016 1417     Impression and Plan: Amanda Henry is a very charming 51 yo African American female with iron deficiency anemia secondary to menorrhagia. She received IV iron in December but is starting to notice her symptoms come back. She is having dizziness, ringing in ears, brain fog, weakness and fatigue.  Hgb is down at 8.7 with a platelet count of 570. We will see what her iron studies show and bring her back in later this week for an infusion if needed.  She has an appointment with gynecology next week and plan to schedule her hysterectomy at that time. Hopefully once she has this procedure her iron deficiency will resolve.  We will schedule her follow-up once we have her lab results.  She will contact our office with any questions or concerns. We can certainly see her sooner if need be.   Eliezer Bottom, NP 1/10/20183:43 PM

## 2016-05-29 ENCOUNTER — Other Ambulatory Visit: Payer: Self-pay | Admitting: Family

## 2016-05-29 LAB — FERRITIN: Ferritin: 34 ng/ml (ref 9–269)

## 2016-05-29 LAB — IRON AND TIBC
%SAT: 4 % — ABNORMAL LOW (ref 21–57)
IRON: 17 ug/dL — AB (ref 41–142)
TIBC: 380 ug/dL (ref 236–444)
UIBC: 363 ug/dL (ref 120–384)

## 2016-05-30 ENCOUNTER — Other Ambulatory Visit: Payer: BC Managed Care – PPO

## 2016-05-30 ENCOUNTER — Telehealth: Payer: Self-pay | Admitting: *Deleted

## 2016-05-30 ENCOUNTER — Ambulatory Visit: Payer: BC Managed Care – PPO | Admitting: Hematology & Oncology

## 2016-05-30 NOTE — Telephone Encounter (Addendum)
Patient aware of results. Appointments made  ----- Message from Eliezer Bottom, NP sent at 05/29/2016  1:17 PM EST ----- Regarding: Iron  Iron studies very low. She will need 2 doses of IV iron scheduled please. Thank you!  Sarah  ----- Message ----- From: Volanda Napoleon, MD Sent: 05/29/2016   7:19 AM To: Eliezer Bottom, NP    ----- Message ----- From: Interface, Lab In Three Zero One Sent: 05/28/2016   3:30 PM To: Volanda Napoleon, MD

## 2016-06-02 ENCOUNTER — Ambulatory Visit (HOSPITAL_BASED_OUTPATIENT_CLINIC_OR_DEPARTMENT_OTHER): Payer: BC Managed Care – PPO

## 2016-06-02 VITALS — BP 131/63 | HR 74 | Temp 98.2°F | Resp 18

## 2016-06-02 DIAGNOSIS — D500 Iron deficiency anemia secondary to blood loss (chronic): Secondary | ICD-10-CM | POA: Diagnosis not present

## 2016-06-02 DIAGNOSIS — D509 Iron deficiency anemia, unspecified: Secondary | ICD-10-CM

## 2016-06-02 DIAGNOSIS — N920 Excessive and frequent menstruation with regular cycle: Secondary | ICD-10-CM | POA: Diagnosis not present

## 2016-06-02 MED ORDER — SODIUM CHLORIDE 0.9 % IV SOLN
510.0000 mg | Freq: Once | INTRAVENOUS | Status: AC
  Administered 2016-06-02: 510 mg via INTRAVENOUS
  Filled 2016-06-02: qty 17

## 2016-06-02 MED ORDER — SODIUM CHLORIDE 0.9 % IV SOLN
Freq: Once | INTRAVENOUS | Status: AC
Start: 2016-06-02 — End: 2016-06-02
  Administered 2016-06-02: 10:00:00 via INTRAVENOUS

## 2016-06-02 NOTE — Patient Instructions (Signed)
Ferumoxytol injection What is this medicine? FERUMOXYTOL is an iron complex. Iron is used to make healthy red blood cells, which carry oxygen and nutrients throughout the body. This medicine is used to treat iron deficiency anemia in people with chronic kidney disease. COMMON BRAND NAME(S): Feraheme What should I tell my health care provider before I take this medicine? They need to know if you have any of these conditions: -anemia not caused by low iron levels -high levels of iron in the blood -magnetic resonance imaging (MRI) test scheduled -an unusual or allergic reaction to iron, other medicines, foods, dyes, or preservatives -pregnant or trying to get pregnant -breast-feeding How should I use this medicine? This medicine is for injection into a vein. It is given by a health care professional in a hospital or clinic setting. Talk to your pediatrician regarding the use of this medicine in children. Special care may be needed. What if I miss a dose? It is important not to miss your dose. Call your doctor or health care professional if you are unable to keep an appointment. What may interact with this medicine? This medicine may interact with the following medications: -other iron products What should I watch for while using this medicine? Visit your doctor or healthcare professional regularly. Tell your doctor or healthcare professional if your symptoms do not start to get better or if they get worse. You may need blood work done while you are taking this medicine. You may need to follow a special diet. Talk to your doctor. Foods that contain iron include: whole grains/cereals, dried fruits, beans, or peas, leafy green vegetables, and organ meats (liver, kidney). What side effects may I notice from receiving this medicine? Side effects that you should report to your doctor or health care professional as soon as possible: -allergic reactions like skin rash, itching or hives, swelling of the  face, lips, or tongue -breathing problems -changes in blood pressure -feeling faint or lightheaded, falls -fever or chills -flushing, sweating, or hot feelings -swelling of the ankles or feet Side effects that usually do not require medical attention (report to your doctor or health care professional if they continue or are bothersome): -diarrhea -headache -nausea, vomiting -stomach pain Where should I keep my medicine? This drug is given in a hospital or clinic and will not be stored at home.  2017 Elsevier/Gold Standard (2015-06-07 12:41:49)  

## 2016-06-11 ENCOUNTER — Ambulatory Visit (HOSPITAL_BASED_OUTPATIENT_CLINIC_OR_DEPARTMENT_OTHER): Payer: BC Managed Care – PPO

## 2016-06-11 VITALS — BP 137/78 | HR 73 | Temp 97.8°F | Resp 16

## 2016-06-11 DIAGNOSIS — N920 Excessive and frequent menstruation with regular cycle: Secondary | ICD-10-CM | POA: Diagnosis not present

## 2016-06-11 DIAGNOSIS — D509 Iron deficiency anemia, unspecified: Secondary | ICD-10-CM

## 2016-06-11 DIAGNOSIS — D500 Iron deficiency anemia secondary to blood loss (chronic): Secondary | ICD-10-CM

## 2016-06-11 MED ORDER — SODIUM CHLORIDE 0.9 % IV SOLN
510.0000 mg | Freq: Once | INTRAVENOUS | Status: AC
  Administered 2016-06-11: 510 mg via INTRAVENOUS
  Filled 2016-06-11: qty 17

## 2016-06-11 NOTE — Patient Instructions (Signed)
Ferumoxytol injection What is this medicine? FERUMOXYTOL is an iron complex. Iron is used to make healthy red blood cells, which carry oxygen and nutrients throughout the body. This medicine is used to treat iron deficiency anemia in people with chronic kidney disease. COMMON BRAND NAME(S): Feraheme What should I tell my health care provider before I take this medicine? They need to know if you have any of these conditions: -anemia not caused by low iron levels -high levels of iron in the blood -magnetic resonance imaging (MRI) test scheduled -an unusual or allergic reaction to iron, other medicines, foods, dyes, or preservatives -pregnant or trying to get pregnant -breast-feeding How should I use this medicine? This medicine is for injection into a vein. It is given by a health care professional in a hospital or clinic setting. Talk to your pediatrician regarding the use of this medicine in children. Special care may be needed. What if I miss a dose? It is important not to miss your dose. Call your doctor or health care professional if you are unable to keep an appointment. What may interact with this medicine? This medicine may interact with the following medications: -other iron products What should I watch for while using this medicine? Visit your doctor or healthcare professional regularly. Tell your doctor or healthcare professional if your symptoms do not start to get better or if they get worse. You may need blood work done while you are taking this medicine. You may need to follow a special diet. Talk to your doctor. Foods that contain iron include: whole grains/cereals, dried fruits, beans, or peas, leafy green vegetables, and organ meats (liver, kidney). What side effects may I notice from receiving this medicine? Side effects that you should report to your doctor or health care professional as soon as possible: -allergic reactions like skin rash, itching or hives, swelling of the  face, lips, or tongue -breathing problems -changes in blood pressure -feeling faint or lightheaded, falls -fever or chills -flushing, sweating, or hot feelings -swelling of the ankles or feet Side effects that usually do not require medical attention (report to your doctor or health care professional if they continue or are bothersome): -diarrhea -headache -nausea, vomiting -stomach pain Where should I keep my medicine? This drug is given in a hospital or clinic and will not be stored at home.  2017 Elsevier/Gold Standard (2015-06-07 12:41:49)  

## 2016-07-09 ENCOUNTER — Ambulatory Visit: Payer: BC Managed Care – PPO | Admitting: Family

## 2016-07-09 ENCOUNTER — Other Ambulatory Visit: Payer: BC Managed Care – PPO

## 2016-07-31 LAB — HM PAP SMEAR: HPV 16/18/45 genotyping: NEGATIVE

## 2016-08-04 ENCOUNTER — Other Ambulatory Visit: Payer: Self-pay | Admitting: Obstetrics and Gynecology

## 2016-08-04 DIAGNOSIS — R928 Other abnormal and inconclusive findings on diagnostic imaging of breast: Secondary | ICD-10-CM

## 2016-08-11 ENCOUNTER — Ambulatory Visit
Admission: RE | Admit: 2016-08-11 | Discharge: 2016-08-11 | Disposition: A | Discharge status: Home / Self Care | Payer: BC Managed Care – PPO | Source: Ambulatory Visit | Attending: Obstetrics and Gynecology | Admitting: Obstetrics and Gynecology

## 2016-08-11 DIAGNOSIS — R928 Other abnormal and inconclusive findings on diagnostic imaging of breast: Secondary | ICD-10-CM

## 2016-08-28 ENCOUNTER — Emergency Department (HOSPITAL_COMMUNITY)
Admission: EM | Admit: 2016-08-28 | Discharge: 2016-08-28 | Disposition: A | Discharge status: Home / Self Care | Payer: BC Managed Care – PPO | Attending: Emergency Medicine | Admitting: Emergency Medicine

## 2016-08-28 ENCOUNTER — Encounter (HOSPITAL_COMMUNITY): Payer: Self-pay | Admitting: Nurse Practitioner

## 2016-08-28 DIAGNOSIS — Z79899 Other long term (current) drug therapy: Secondary | ICD-10-CM | POA: Diagnosis not present

## 2016-08-28 DIAGNOSIS — N939 Abnormal uterine and vaginal bleeding, unspecified: Secondary | ICD-10-CM | POA: Diagnosis present

## 2016-08-28 DIAGNOSIS — D649 Anemia, unspecified: Secondary | ICD-10-CM

## 2016-08-28 DIAGNOSIS — I10 Essential (primary) hypertension: Secondary | ICD-10-CM | POA: Diagnosis not present

## 2016-08-28 DIAGNOSIS — Z8742 Personal history of other diseases of the female genital tract: Secondary | ICD-10-CM

## 2016-08-28 DIAGNOSIS — D6489 Other specified anemias: Secondary | ICD-10-CM | POA: Diagnosis not present

## 2016-08-28 HISTORY — DX: Iron deficiency anemia, unspecified: D50.9

## 2016-08-28 HISTORY — DX: Encounter for other specified aftercare: Z51.89

## 2016-08-28 LAB — URINALYSIS, ROUTINE W REFLEX MICROSCOPIC
BILIRUBIN URINE: NEGATIVE
Glucose, UA: NEGATIVE mg/dL
Ketones, ur: NEGATIVE mg/dL
NITRITE: NEGATIVE
PROTEIN: NEGATIVE mg/dL
SPECIFIC GRAVITY, URINE: 1.006 (ref 1.005–1.030)
Squamous Epithelial / LPF: NONE SEEN
pH: 5 (ref 5.0–8.0)

## 2016-08-28 LAB — CBC WITH DIFFERENTIAL/PLATELET
BASOS ABS: 0.1 10*3/uL (ref 0.0–0.1)
Basophils Relative: 1 %
EOS ABS: 0.3 10*3/uL (ref 0.0–0.7)
EOS PCT: 3 %
HCT: 25.1 % — ABNORMAL LOW (ref 36.0–46.0)
Hemoglobin: 7.4 g/dL — ABNORMAL LOW (ref 12.0–15.0)
LYMPHS ABS: 2.5 10*3/uL (ref 0.7–4.0)
Lymphocytes Relative: 25 %
MCH: 21.8 pg — ABNORMAL LOW (ref 26.0–34.0)
MCHC: 29.5 g/dL — ABNORMAL LOW (ref 30.0–36.0)
MCV: 74 fL — AB (ref 78.0–100.0)
MONO ABS: 0.5 10*3/uL (ref 0.1–1.0)
Monocytes Relative: 5 %
NEUTROS PCT: 66 %
Neutro Abs: 6.5 10*3/uL (ref 1.7–7.7)
PLATELETS: 497 10*3/uL — AB (ref 150–400)
RBC: 3.39 MIL/uL — AB (ref 3.87–5.11)
RDW: 20.8 % — AB (ref 11.5–15.5)
WBC: 9.9 10*3/uL (ref 4.0–10.5)

## 2016-08-28 LAB — COMPREHENSIVE METABOLIC PANEL
ALT: 11 U/L — AB (ref 14–54)
AST: 17 U/L (ref 15–41)
Albumin: 3.8 g/dL (ref 3.5–5.0)
Alkaline Phosphatase: 66 U/L (ref 38–126)
Anion gap: 8 (ref 5–15)
BUN: 11 mg/dL (ref 6–20)
CHLORIDE: 103 mmol/L (ref 101–111)
CO2: 24 mmol/L (ref 22–32)
CREATININE: 0.73 mg/dL (ref 0.44–1.00)
Calcium: 9.7 mg/dL (ref 8.9–10.3)
GFR calc Af Amer: >60 mL/min (ref >60)
GFR calc non Af Amer: >60 mL/min (ref >60)
Glucose, Bld: 106 mg/dL — ABNORMAL HIGH (ref 65–99)
POTASSIUM: 3.4 mmol/L — AB (ref 3.5–5.1)
Sodium: 135 mmol/L (ref 135–145)
Total Bilirubin: 0.4 mg/dL (ref 0.3–1.2)
Total Protein: 7.6 g/dL (ref 6.5–8.1)

## 2016-08-28 LAB — PREPARE RBC (CROSSMATCH)

## 2016-08-28 LAB — PREGNANCY, URINE: PREG TEST UR: NEGATIVE

## 2016-08-28 MED ORDER — SODIUM CHLORIDE 0.9 % IV SOLN
10.0000 mL/h | Freq: Once | INTRAVENOUS | Status: AC
  Administered 2016-08-28: 10 mL/h via INTRAVENOUS

## 2016-08-28 NOTE — Discharge Instructions (Signed)
Follow-up with Dr. Marvel Plan. Return to the ED for new or worsening symptoms-- weakness, dizziness, syncopal events, etc.

## 2016-08-28 NOTE — ED Triage Notes (Signed)
Pt states "I am a known anemic. I know that I have fibroids, however, my cycle currently is heavy and last night I started experiencing some dizziness and when I got up to go to the bathroom I fell because I was so weak. I called my oncall GYN and she advised me to come to ED because this happened  Earlier this year and I had to receive a blood transfusion and also an iron transfusion because my hemoglobin was so low. Pt denies any shortness of breath, reports weakness, light headedness, soaking pad about 10-15 min within the past hour.

## 2016-08-28 NOTE — ED Provider Notes (Signed)
[facility] DEPT Provider Note   CSN: [id number] Arrival date & time: 08/28/16  0415     History   Chief Complaint Chief Complaint  Patient presents with  . Vaginal Bleeding  . Weakness    HPI Amanda Henry is a 51 y.o. female.  The history is provided by the patient and medical records.  Vaginal Bleeding  Primary symptoms include vaginal bleeding. Associated symptoms include light-headedness.  Weakness    51 year old female with history of anxiety, depression, hypertension, thyroid disease, iron deficiency anemia, history of menometrorrhagia requiring transfusion, presenting to the ED with concern over worsening anemia. Patient reports she has a long-standing history of heavy vaginal bleeding. She is followed by OB/GYN, Dr. Marvel Plan. States she is being scheduled for fibroidectomy/possible hysterectomy soon.  States she started her current menstrual cycle 2 days ago which has been heavy. States she was soaking through pads in about 45 minutes. States she has been passing clots which is typical of her. States she is not having any significant cramping or abdominal pain at this time. States she does feel lightheaded when she gets up and has felt generally weak. Her last blood transfusion was September 2017, but she has been getting regular iron infusions with Dr. Marin Olp in clinic.  States she did call the OB/GYN on call for Dr. Marvel Plan and was encouraged to come to the ED for possible transfusion. Patient reports due to her upcoming procedures, her doctor was hoping to have her hemoglobin greater than 10 prior to operating.  Reports she just had a pelvic exam about a week ago in OB-GYN clinic.  States she has not had any new pelvic pain or vaginal discharge.    Past Medical History:  Diagnosis Date  . Anxiety   . Blood transfusion without reported diagnosis   . Depression   . Hypertension   . Iron deficiency anemia   . Thyroid disease     Patient Active Problem List   Diagnosis Date Noted  . Iron deficiency anemia 05/06/2016  . Unspecified hypothyroidism 05/24/2013  . 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 No pertinent surgical history.  OB History    No data available       Home Medications    Prior to Admission medications   Medication Sig Start Date End Date Taking? Authorizing Provider  ALPRAZolam (XANAX XR) 1 MG 24 hr tablet Take 1 mg by mouth at bedtime as needed for anxiety or sleep.    Historical Provider, MD  FLUoxetine HCl 60 MG TABS Take 60 mg by mouth daily.    Historical Provider, MD  ibuprofen (ADVIL,MOTRIN) 200 MG tablet Take 200-400 mg by mouth every 6 (six) hours as needed for moderate pain or cramping.    Historical Provider, MD  lamoTRIgine (LAMICTAL) 100 MG tablet Take 100 mg by mouth at bedtime. 02/05/16   Historical Provider, MD  levothyroxine (SYNTHROID, LEVOTHROID) 175 MCG tablet Take 175 mcg by mouth daily before breakfast.    Historical Provider, MD  Multiple Vitamin (MULTIVITAMIN WITH MINERALS) TABS tablet Take 1 tablet by mouth daily.    Historical Provider, MD    Family History Family History  Problem Relation Age of Onset  . Thyroid disease Mother   . Hypertension Mother   . Diabetes Father   . Hypertension Other   . Diabetes Other   . Thyroid disease Other   . Heart disease Other     Social History Social History  Substance Use Topics  . Smoking status:  Never Smoker  . Smokeless tobacco: Never Used  . Alcohol use No     Allergies   Penicillins   Review of Systems Review of Systems  Genitourinary: Positive for vaginal bleeding.  Neurological: Positive for weakness and light-headedness.  All other systems reviewed and are negative.    Physical Exam Updated Vital Signs BP 136/86 (BP Location: Left Arm)   Pulse 92   Temp 98.1 F (36.7 C) (Oral)   Resp 18   Ht 5\' 7"  (1.702 m)   Wt 93 kg   LMP 08/28/2016 (Exact Date)   SpO2 99%   BMI 32.11 kg/m   Physical Exam    Constitutional: She is oriented to person, place, and time. She appears well-developed and well-nourished.  HENT:  Head: Normocephalic and atraumatic.  Mouth/Throat: Oropharynx is clear and moist.  Eyes: Conjunctivae and EOM are normal. Pupils are equal, round, and reactive to light.  Neck: Normal range of motion.  Cardiovascular: Normal rate, regular rhythm and normal heart sounds.   Pulmonary/Chest: Effort normal and breath sounds normal.  Abdominal: Soft. Bowel sounds are normal. There is no tenderness. There is no rebound.  Soft, benign, no tenderness  Musculoskeletal: Normal range of motion.  Neurological: She is alert and oriented to person, place, and time.  Skin: Skin is warm and dry.  Psychiatric: She has a normal mood and affect.  Nursing note and vitals reviewed.    ED Treatments / Results  Labs (all labs ordered are listed, but only abnormal results are displayed) Labs Reviewed  COMPREHENSIVE METABOLIC PANEL - Abnormal; Notable for the following:       Result Value   Potassium 3.4 (*)    Glucose, Bld 106 (*)    ALT 11 (*)    All other components within normal limits  URINALYSIS, ROUTINE W REFLEX MICROSCOPIC - Abnormal; Notable for the following:    Color, Urine RED (*)    APPearance HAZY (*)    Hgb urine dipstick LARGE (*)    Leukocytes, UA TRACE (*)    Bacteria, UA FEW (*)    All other components within normal limits  CBC WITH DIFFERENTIAL/PLATELET - Abnormal; Notable for the following:    RBC 3.39 (*)    Hemoglobin 7.4 (*)    HCT 25.1 (*)    MCV 74.0 (*)    MCH 21.8 (*)    MCHC 29.5 (*)    RDW 20.8 (*)    Platelets 497 (*)    All other components within normal limits  PREGNANCY, URINE  CBC WITH DIFFERENTIAL/PLATELET  TYPE AND SCREEN  PREPARE RBC (CROSSMATCH)    EKG  EKG Interpretation None       Radiology No results found.  Procedures Procedures (including critical care time)  CRITICAL CARE Performed by: Larene Pickett   Total  critical care time: 45 minutes  Critical care time was exclusive of separately billable procedures and treating other patients.  Critical care was necessary to treat or prevent imminent or life-threatening deterioration.  Critical care was time spent personally by me on the following activities: development of treatment plan with patient and/or surrogate as well as nursing, discussions with consultants, evaluation of patient's response to treatment, examination of patient, obtaining history from patient or surrogate, ordering and performing treatments and interventions, ordering and review of laboratory studies, ordering and review of radiographic studies, pulse oximetry and re-evaluation of patient's condition.   Medications Ordered in ED Medications - No data to display   Initial Impression /  Assessment and Plan / ED Course  I have reviewed the triage vital signs and the nursing notes.  Pertinent labs & imaging results that were available during my care of the patient were reviewed by me and considered in my medical decision making (see chart for details).   51 y.o. F here with concern for anemia.  Has hx of Fe+ def anemia as well as Menorrhagia. Has required blood transfusions and IV iron infusions in the past. Here she is afebrile and nontoxic. She is hemodynamically stable. She overall appears well. Her abdomen is soft and benign. Reports she had a pelvic exam with her GYN about a week ago. She has no new complaints of vaginal discharge or pelvic pain. Blood work here with hemoglobin of 7.4, electrolytes are stable. She is due for surgical intervention soon, was told that her hemoglobin should be greater than 10 prior to having this done. I have spoken with OB/GYN on call for Dr. Marvel Plan, Dr. Willis Modena-- recommends transfusion of blood here, OK to discharge home after barring any acute complications, have her follow-up with Dr. Marvel Plan for definitive care soon.    11:37 AM Patient has  received 1 unit PRBC's.  Transfusion without noted issues.  Has been observed for approx 30 mins post transfusion, remains without issue or adverse reactions.  Remains hemodynamically stable. Feel she is stable for discharge. She reported that her OB/GYN has called and progesterone for her, encouraged close follow-up in the clinic.  Discussed plan with patient, she acknowledged understanding and agreed with plan of care.  Return precautions given for new or worsening symptoms.  Final Clinical Impressions(s) / ED Diagnoses   Final diagnoses:  Symptomatic anemia  History of heavy vaginal bleeding    New Prescriptions New Prescriptions   No medications on file     Larene Pickett, Hershal Coria 08/28/16 [place], MD 08/28/16 [id number]

## 2016-08-28 NOTE — ED Notes (Signed)
PT states understanding of care given, follow up care. PT ambulated from ED to car with a steady gait.  

## 2016-08-29 LAB — BPAM RBC
Blood Product Expiration Date: 201805022359
ISSUE DATE / TIME: 201804120744
UNIT TYPE AND RH: 5100

## 2016-08-29 LAB — TYPE AND SCREEN
ABO/RH(D): O POS
ANTIBODY SCREEN: NEGATIVE
Unit division: 0

## 2016-09-04 ENCOUNTER — Telehealth: Payer: Self-pay | Admitting: Hematology & Oncology

## 2016-09-04 NOTE — Telephone Encounter (Signed)
Patient called and left a message wanting to schedule an appt. I returned her call, but received no answer. Patient's voicemail mailbox was full, so I could not leave a message.       [facility] APRIL 2018  [facility]

## 2016-09-10 ENCOUNTER — Ambulatory Visit (HOSPITAL_BASED_OUTPATIENT_CLINIC_OR_DEPARTMENT_OTHER): Payer: BC Managed Care – PPO | Admitting: Family

## 2016-09-10 ENCOUNTER — Other Ambulatory Visit (HOSPITAL_BASED_OUTPATIENT_CLINIC_OR_DEPARTMENT_OTHER): Payer: BC Managed Care – PPO

## 2016-09-10 ENCOUNTER — Encounter: Payer: Self-pay | Admitting: *Deleted

## 2016-09-10 VITALS — BP 144/69 | HR 95 | Temp 98.3°F | Resp 16 | Wt 206.0 lb

## 2016-09-10 DIAGNOSIS — D500 Iron deficiency anemia secondary to blood loss (chronic): Secondary | ICD-10-CM | POA: Diagnosis not present

## 2016-09-10 DIAGNOSIS — D509 Iron deficiency anemia, unspecified: Secondary | ICD-10-CM

## 2016-09-10 DIAGNOSIS — N920 Excessive and frequent menstruation with regular cycle: Secondary | ICD-10-CM | POA: Insufficient documentation

## 2016-09-10 LAB — CBC WITH DIFFERENTIAL (CANCER CENTER ONLY)
BASO#: 0 10*3/uL (ref 0.0–0.2)
BASO%: 0.4 % (ref 0.0–2.0)
EOS%: 2.4 % (ref 0.0–7.0)
Eosinophils Absolute: 0.2 10*3/uL (ref 0.0–0.5)
HEMATOCRIT: 23.4 % — AB (ref 34.8–46.6)
HGB: 7 g/dL — ABNORMAL LOW (ref 11.6–15.9)
LYMPH#: 1.7 10*3/uL (ref 0.9–3.3)
LYMPH%: 20.3 % (ref 14.0–48.0)
MCH: 22.4 pg — ABNORMAL LOW (ref 26.0–34.0)
MCHC: 29.9 g/dL — AB (ref 32.0–36.0)
MCV: 75 fL — AB (ref 81–101)
MONO#: 0.4 10*3/uL (ref 0.1–0.9)
MONO%: 4.3 % (ref 0.0–13.0)
NEUT#: 6.1 10*3/uL (ref 1.5–6.5)
NEUT%: 72.6 % (ref 39.6–80.0)
PLATELETS: 592 10*3/uL — AB (ref 145–400)
RBC: 3.12 10*6/uL — ABNORMAL LOW (ref 3.70–5.32)
RDW: 21.1 % — AB (ref 11.1–15.7)
WBC: 8.4 10*3/uL (ref 3.9–10.0)

## 2016-09-10 NOTE — Progress Notes (Signed)
Hematology and Oncology Follow Up Visit  Briawna Carver [id number] [birth year] 51 y.o. 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 Pizzuto is here today for follow-up. She was recently in the ED for anemia due to her heavy cycle. She states that on the first day on her last cycle she had to change her pad 10 times within one hour which led to her going to the ED. She has since started Provera with her gynecologist to help slow/stop her cycle. She is scheduled to have a hysterectomy on May 16th.  Hgb today is 7.0 with an MCV of 75 and she is quite symptomatic. She c/o fatigue, weakness, dizziness without syncope, insomnia, SOB with exertion, chills, craving clay dirt (pica) and palpitations.  No fever, n/v, cough, rash, headache, chest pain, abdominal or changes in bowel or bladder habits. She has occasional constipations and will increase her fluid intake to help with this.  No swelling, tenderness, numbness or tingling in her extremities. No c/o pain at this time.  She has maintained a good appetite and is staying well hydrated.   ECOG Performance Status: 1 - Symptomatic but completely ambulatory  Medications:  Allergies as of 09/10/2016      Reactions   Penicillins Rash   Has patient had a PCN reaction causing immediate rash, facial/tongue/throat swelling, SOB or lightheadedness with hypotension: Yes Has patient had a PCN reaction causing severe rash involving mucus membranes or skin necrosis: No Has patient had a PCN reaction that required hospitalization No Has patient had a PCN reaction occurring within the last 10 years: No If all of the above answers are "NO", then may proceed with Cephalosporin use.      Medication List       Accurate as of 09/10/16  1:40 PM. Always use your most recent med list.            ALPRAZolam 1 MG 24 hr tablet Commonly known as:  XANAX XR Take 1 mg by mouth at bedtime as needed for anxiety or sleep.   fluticasone 50 MCG/ACT nasal spray Commonly known as:  FLONASE Place 1 spray into both nostrils daily.   ibuprofen 200 MG tablet Commonly known as:  ADVIL,MOTRIN Take 200-400 mg by mouth every 6 (six) hours as needed for moderate pain or cramping.   lamoTRIgine 100 MG tablet Commonly known as:  LAMICTAL Take 100 mg by mouth at bedtime.   levothyroxine 175 MCG tablet Commonly known as:  SYNTHROID, LEVOTHROID Take 175 mcg by mouth daily before breakfast.       Allergies:  Allergies  Allergen Reactions  . Penicillins Rash    Has patient had a PCN reaction causing immediate rash, facial/tongue/throat swelling, SOB or lightheadedness with hypotension: Yes Has patient had a PCN reaction causing severe rash involving mucus membranes or skin necrosis: No Has patient had a PCN reaction that required hospitalization No Has patient had a PCN reaction occurring within the last 10 years: No If all of the above answers are "NO", then may proceed with Cephalosporin use.     Past Medical History, Surgical history, Social history, and Family History were reviewed and updated.  Review of Systems: All other 10 point review of systems is negative.   Physical Exam:  vitals were not taken for this visit.  Wt Readings  from Last 3 Encounters:  08/28/16 205 lb (93 kg)  05/28/16 202 lb (91.6 kg)  04/30/16 207 lb 1 oz (93.9 kg)    Ocular: Sclerae unicteric, pupils equal, round and reactive to light Ear-nose-throat: Oropharynx clear, dentition fair Lymphatic: No cervical, supraclavicular or axillary adenopathy Lungs no rales or rhonchi, good excursion bilaterally Heart regular rate and rhythm, no murmur appreciated Abd soft, nontender, positive bowel sounds, no liver or spleen tip palpated on exam, no fluid wave MSK no focal spinal tenderness, no joint edema Neuro:  non-focal, well-oriented, appropriate affect Breasts: Deferred   Lab Results  Component Value Date   WBC 8.4 09/10/2016   HGB 7.0 (L) 09/10/2016   HCT 23.4 (L) 09/10/2016   MCV 75 (L) 09/10/2016   PLT 592 (H) 09/10/2016   Lab Results  Component Value Date   FERRITIN 34 05/28/2016   IRON 17 (L) 05/28/2016   TIBC 380 05/28/2016   UIBC 363 05/28/2016   IRONPCTSAT 4 (L) 05/28/2016   Lab Results  Component Value Date   RBC 3.12 (L) 09/10/2016   No results found for: KPAFRELGTCHN, LAMBDASER, KAPLAMBRATIO No results found for: IGGSERUM, IGA, IGMSERUM No results found for: Odetta Pink, SPEI   Chemistry      Component Value Date/Time   NA 135 08/28/2016 0530   NA 138 05/28/2016 1519   NA 138 04/21/2016 1417   K 3.4 (L) 08/28/2016 0530   K 3.6 05/28/2016 1519   K 3.6 04/21/2016 1417   CL 103 08/28/2016 0530   CL 104 05/28/2016 1519   CO2 24 08/28/2016 0530   CO2 28 05/28/2016 1519   CO2 23 04/21/2016 1417   BUN 11 08/28/2016 0530   BUN 8 05/28/2016 1519   BUN 10.8 04/21/2016 1417   CREATININE 0.73 08/28/2016 0530   CREATININE 0.8 05/28/2016 1519   CREATININE 0.8 04/21/2016 1417      Component Value Date/Time   CALCIUM 9.7 08/28/2016 0530   CALCIUM 10.5 (H) 05/28/2016 1519   CALCIUM 10.4 04/21/2016 1417   ALKPHOS 66 08/28/2016 0530   ALKPHOS 76 05/28/2016 1519   ALKPHOS 120 04/21/2016 1417   AST 17 08/28/2016 0530   AST 21 05/28/2016 1519   AST 13 04/21/2016 1417   ALT 11 (L) 08/28/2016 0530   ALT 23 05/28/2016 1519   ALT 11 04/21/2016 1417   BILITOT 0.4 08/28/2016 0530   BILITOT 0.50 05/28/2016 1519   BILITOT <0.22 04/21/2016 1417      Impression and Plan: Ms. Lagerstrom is a very pleasant 51 yo African American female with history of iron deficiency anemia secondary to chronic blood loss due to menorrhagia. She was in the ED 2 weeks ago for anemia with her cycle and is now on Provera prior to her scheduled  hysterectomy in May (16th). She is symptomatic, as mentioned above, with her Hgb of 7.0.  I discussed this with Dr. Marin Olp and we will bring her back in tomorrow for Infed. This will be an all day infusion so she knows to be here at 8 am.  All questions were answered and she is in agreement with the plan. We will have her come back in 2 weeks for repeat lab work and follow-up. This will be one weeks before her procedure so we can give another dose if needed.  She will contact our office with any questions or concerns. We can certainly see her sooner if need be.   Eliezer Bottom, NP  4/25/20181:40 PM

## 2016-09-11 ENCOUNTER — Ambulatory Visit (HOSPITAL_BASED_OUTPATIENT_CLINIC_OR_DEPARTMENT_OTHER): Payer: BC Managed Care – PPO

## 2016-09-11 VITALS — BP 130/62 | HR 84 | Temp 97.7°F | Resp 18

## 2016-09-11 DIAGNOSIS — N920 Excessive and frequent menstruation with regular cycle: Secondary | ICD-10-CM

## 2016-09-11 DIAGNOSIS — D509 Iron deficiency anemia, unspecified: Secondary | ICD-10-CM

## 2016-09-11 DIAGNOSIS — D500 Iron deficiency anemia secondary to blood loss (chronic): Secondary | ICD-10-CM | POA: Diagnosis not present

## 2016-09-11 LAB — COMPREHENSIVE METABOLIC PANEL (CC13)
ALK PHOS: 79 IU/L (ref 39–117)
ALT: 10 IU/L (ref 0–32)
AST: 33 IU/L (ref 0–40)
Albumin, Serum: 4 g/dL (ref 3.5–5.5)
Albumin/Globulin Ratio: 1.2 (ref 1.2–2.2)
BUN/Creatinine Ratio: 12 (ref 9–23)
BUN: 9 mg/dL (ref 6–24)
Bilirubin Total: 0.2 mg/dL (ref 0.0–1.2)
CALCIUM: 10.2 mg/dL (ref 8.7–10.2)
CO2: 24 mmol/L (ref 18–29)
CREATININE: 0.74 mg/dL (ref 0.57–1.00)
Chloride, Ser: 102 mmol/L (ref 96–106)
GFR calc Af Amer: 110 mL/min/{1.73_m2} (ref >59)
GFR calc non Af Amer: 95 mL/min/{1.73_m2} (ref >59)
GLUCOSE: 124 mg/dL — AB (ref 65–99)
Globulin, Total: 3.4 g/dL (ref 1.5–4.5)
Potassium, Ser: 4.1 mmol/L (ref 3.5–5.2)
SODIUM: 137 mmol/L (ref 134–144)
Total Protein: 7.4 g/dL (ref 6.0–8.5)

## 2016-09-11 LAB — IRON AND TIBC
%SAT: 2 % — AB (ref 21–57)
Iron: 10 ug/dL — ABNORMAL LOW (ref 41–142)
TIBC: 436 ug/dL (ref 236–444)
UIBC: 426 ug/dL — AB (ref 120–384)

## 2016-09-11 LAB — FERRITIN: Ferritin: <4 ng/ml — ABNORMAL LOW (ref 9–269)

## 2016-09-11 MED ORDER — SODIUM CHLORIDE 0.9 % IV SOLN
2000.0000 mg | Freq: Once | INTRAVENOUS | Status: AC
  Administered 2016-09-11: 2000 mg via INTRAVENOUS
  Filled 2016-09-11: qty 40

## 2016-09-11 MED ORDER — SODIUM CHLORIDE 0.9% FLUSH
10.0000 mL | INTRAVENOUS | Status: DC | PRN
  Filled 2016-09-11: qty 10

## 2016-09-11 MED ORDER — ACETAMINOPHEN 325 MG PO TABS
650.0000 mg | ORAL_TABLET | Freq: Once | ORAL | Status: AC
  Administered 2016-09-11: 650 mg via ORAL

## 2016-09-11 MED ORDER — DIPHENHYDRAMINE HCL 25 MG PO TABS
50.0000 mg | ORAL_TABLET | Freq: Once | ORAL | Status: AC
  Administered 2016-09-11: 50 mg via ORAL
  Filled 2016-09-11: qty 2

## 2016-09-11 MED ORDER — SODIUM CHLORIDE 0.9% FLUSH
3.0000 mL | Freq: Once | INTRAVENOUS | Status: DC | PRN
  Filled 2016-09-11: qty 10

## 2016-09-11 MED ORDER — SODIUM CHLORIDE 0.9 % IV SOLN: 50.0000 mg | Freq: Once | INTRAVENOUS | Status: DC

## 2016-09-11 MED ORDER — DIPHENHYDRAMINE HCL 25 MG PO CAPS
ORAL_CAPSULE | ORAL | Status: AC
Start: 2016-09-11 — End: 2016-09-11
  Filled 2016-09-11: qty 2

## 2016-09-11 MED ORDER — SODIUM CHLORIDE 0.9 % IV SOLN
Freq: Once | INTRAVENOUS | Status: AC
  Administered 2016-09-11: 08:00:00 via INTRAVENOUS

## 2016-09-11 MED ORDER — SODIUM CHLORIDE 0.9 % IV SOLN
50.0000 mg | Freq: Once | INTRAVENOUS | Status: AC
  Administered 2016-09-11: 50 mg via INTRAVENOUS
  Filled 2016-09-11: qty 1

## 2016-09-11 MED ORDER — DIPHENHYDRAMINE HCL 25 MG PO CAPS
25.0000 mg | ORAL_CAPSULE | Freq: Once | ORAL | Status: AC
  Administered 2016-09-11: 25 mg via ORAL

## 2016-09-11 MED ORDER — ACETAMINOPHEN 325 MG PO TABS
ORAL_TABLET | ORAL | Status: AC
  Filled 2016-09-11: qty 2

## 2016-09-11 NOTE — Progress Notes (Signed)
1310 - pt reports slight itching to neck and lower extremities. Infed stopped. Denies any other symptoms. Judson Roch, NP assessed pt. Additional 25mg  Benadryl given.  1330 - All sx resolved. Infed restarted.  1503 - completed treatment without further incident. dph

## 2016-09-11 NOTE — Patient Instructions (Signed)

## 2016-09-17 NOTE — Patient Instructions (Signed)
Your procedure is scheduled on:  Wednesday, Oct 01, 2016  Enter through the [employer] of [facility] at:  7:00 AM  Pick up the phone at the desk and dial [phone].  Call this number if you have problems the morning of surgery: [phone].  Remember: Do NOT eat food or drink after:  Midnight Tuesday  Take these medicines the morning of surgery with a SIP OF WATER:  Levothyroxine, Use nasal sprays per normal routine  Stop ALL herbal medications at this time  Do NOT smoke the day of surgery.  Do NOT wear jewelry (body piercing), metal hair clips/bobby pins, make-up, or nail polish. Do NOT wear lotions, powders, or perfumes.  You may wear deodorant. Do NOT shave for 48 hours prior to surgery. Do NOT bring valuables to the hospital. Contacts, dentures, or bridgework may not be worn into surgery.  Leave suitcase in car.  After surgery it may be brought to your room.  For patients admitted to the hospital, checkout time is 11:00 AM the day of discharge.  Bring a copy of your healthcare power of attorney and living will documents.

## 2016-09-18 ENCOUNTER — Encounter (HOSPITAL_COMMUNITY)
Admission: RE | Admit: 2016-09-18 | Discharge: 2016-09-18 | Disposition: A | Discharge status: Home / Self Care | Payer: BC Managed Care – PPO | Source: Ambulatory Visit | Attending: Obstetrics and Gynecology | Admitting: Obstetrics and Gynecology

## 2016-09-18 ENCOUNTER — Encounter (HOSPITAL_COMMUNITY): Payer: Self-pay

## 2016-09-18 DIAGNOSIS — D500 Iron deficiency anemia secondary to blood loss (chronic): Secondary | ICD-10-CM | POA: Diagnosis not present

## 2016-09-18 DIAGNOSIS — Z01812 Encounter for preprocedural laboratory examination: Secondary | ICD-10-CM | POA: Diagnosis not present

## 2016-09-18 DIAGNOSIS — N920 Excessive and frequent menstruation with regular cycle: Secondary | ICD-10-CM | POA: Insufficient documentation

## 2016-09-18 HISTORY — DX: Hypothyroidism, unspecified: E03.9

## 2016-09-18 LAB — CBC
HEMATOCRIT: 27.3 % — AB (ref 36.0–46.0)
HEMOGLOBIN: 8.3 g/dL — AB (ref 12.0–15.0)
MCH: 24.9 pg — ABNORMAL LOW (ref 26.0–34.0)
MCHC: 30.4 g/dL (ref 30.0–36.0)
MCV: 82 fL (ref 78.0–100.0)
Platelets: 399 10*3/uL (ref 150–400)
RBC: 3.33 MIL/uL — ABNORMAL LOW (ref 3.87–5.11)
RDW: 31.2 % — ABNORMAL HIGH (ref 11.5–15.5)
WBC: 9.3 10*3/uL (ref 4.0–10.5)

## 2016-09-18 LAB — BASIC METABOLIC PANEL
ANION GAP: 7 (ref 5–15)
BUN: 8 mg/dL (ref 6–20)
CALCIUM: 10.3 mg/dL (ref 8.9–10.3)
CO2: 24 mmol/L (ref 22–32)
Chloride: 103 mmol/L (ref 101–111)
Creatinine, Ser: 0.69 mg/dL (ref 0.44–1.00)
GFR calc non Af Amer: >60 mL/min (ref >60)
GLUCOSE: 95 mg/dL (ref 65–99)
POTASSIUM: 4.1 mmol/L (ref 3.5–5.1)
SODIUM: 134 mmol/L — AB (ref 135–145)

## 2016-09-18 LAB — TYPE AND SCREEN
ABO/RH(D): O POS
ANTIBODY SCREEN: NEGATIVE

## 2016-09-18 LAB — ABO/RH: ABO/RH(D): O POS

## 2016-09-30 NOTE — H&P (Signed)
Amanda Henry is an 51 y.o. female [phone] who presents for scheduled LAVH/bilateral salpingectomies for menorrhagia.  She has known fibroids with a submucosal fibroid, menses running about 28-30 days and very heavy. Still has pain for 2-3 days as well. They have made her anemic in past with need for transfusions and iron infusions quite frequently.   She is ready for definitive therapy. EMB negative 2016. She was anemic down to 7.8 two weeks ago but repeat Hgb on 5/13 was 10.4.  Pertinent Gynecological History:  OB History  NSVD x 1 26 weeks                     EAB x 1 and SAB x1 Menstrual History:  No LMP recorded.    Past Medical History:  Diagnosis Date  . Anxiety   . Blood transfusion without reported diagnosis   . Depression   . Hypertension    no medications at this time  . Hypothyroidism   . Iron deficiency anemia   . Thyroid disease     Past Surgical History:  Procedure Laterality Date  . WISDOM TOOTH EXTRACTION      Family History  Problem Relation Age of Onset  . Thyroid disease Mother   . Hypertension Mother   . Diabetes Father   . Hypertension Other   . Diabetes Other   . Thyroid disease Other   . Heart disease Other     Social History:  reports that she has never smoked. She has never used smokeless tobacco. She reports that she does not drink alcohol or use drugs.  Allergies:  Allergies  Allergen Reactions  . Latex Rash  . Penicillins Rash    Has patient had a PCN reaction causing immediate rash, facial/tongue/throat swelling, SOB or lightheadedness with hypotension: Yes Has patient had a PCN reaction causing severe rash involving mucus membranes or skin necrosis: No Has patient had a PCN reaction that required hospitalization No Has patient had a PCN reaction occurring within the last 10 years: No If all of the above answers are "NO", then may proceed with Cephalosporin use.     No prescriptions prior to admission.    Review of Systems   Gastrointestinal: Negative for abdominal pain.    There were no vitals taken for this visit. Physical Exam  Constitutional: She appears well-developed and well-nourished.  Cardiovascular: Normal rate and regular rhythm.   Respiratory: Effort normal.  GI: Soft.  Genitourinary: Vagina normal.  Genitourinary Comments: Uterus 10-12 weeks  Neurological: She is alert.  Psychiatric: She has a normal mood and affect.    No results found for this or any previous visit (from the past 24 hour(s)).  No results found.  Assessment/Plan: The patient was counseled regarding treatment options including OCP's, Lupron, hysteroscopic resection of fibroid and hysterectomy. We discussed that given the size of the fibroid, that ablation was not a good option given the distortion of the cavity. We talked about possible adenomyosis and that contributing to her pain. The patient states she wants definitive thearapy. We then discussed hysterectomy--specifically LAVH/bilateral salpingectomies with retention of ovaries if appear normal. Pt was counseled on the risks and benfits of LAVH/BS including bleeding, infection, and possible damage to bowel and bladder or ureters. She would accept a transfusion if needed. We reviewed the procedure in detail including recovery expected if all goes as planned. We discussed a possible incision in the event of any complication and possible delayed recovery from that. We discussed she is  much more high risk for a transfusion given her relative anemia.   Logan Bores 09/30/2016, 1:03 PM

## 2016-10-01 ENCOUNTER — Ambulatory Visit (HOSPITAL_COMMUNITY)
Admission: RE | Admit: 2016-10-01 | Discharge: 2016-10-02 | Disposition: A | Discharge status: Home / Self Care | Payer: BC Managed Care – PPO | Source: Ambulatory Visit | Attending: Obstetrics and Gynecology | Admitting: Obstetrics and Gynecology

## 2016-10-01 ENCOUNTER — Ambulatory Visit (HOSPITAL_COMMUNITY): Payer: BC Managed Care – PPO | Admitting: Certified Registered Nurse Anesthetist

## 2016-10-01 ENCOUNTER — Encounter (HOSPITAL_COMMUNITY): Payer: Self-pay | Admitting: Certified Registered Nurse Anesthetist

## 2016-10-01 ENCOUNTER — Encounter (HOSPITAL_COMMUNITY)
Admission: RE | Disposition: A | Discharge status: Home / Self Care | Payer: Self-pay | Source: Ambulatory Visit | Attending: Obstetrics and Gynecology

## 2016-10-01 DIAGNOSIS — I10 Essential (primary) hypertension: Secondary | ICD-10-CM | POA: Insufficient documentation

## 2016-10-01 DIAGNOSIS — D259 Leiomyoma of uterus, unspecified: Secondary | ICD-10-CM | POA: Insufficient documentation

## 2016-10-01 DIAGNOSIS — D649 Anemia, unspecified: Secondary | ICD-10-CM | POA: Insufficient documentation

## 2016-10-01 DIAGNOSIS — N800 Endometriosis of uterus: Secondary | ICD-10-CM | POA: Diagnosis not present

## 2016-10-01 DIAGNOSIS — Z90710 Acquired absence of both cervix and uterus: Secondary | ICD-10-CM | POA: Diagnosis present

## 2016-10-01 DIAGNOSIS — N920 Excessive and frequent menstruation with regular cycle: Secondary | ICD-10-CM | POA: Diagnosis not present

## 2016-10-01 HISTORY — PX: LAPAROSCOPIC VAGINAL HYSTERECTOMY WITH SALPINGECTOMY: SHX6680

## 2016-10-01 LAB — CBC
HCT: 35.2 % — ABNORMAL LOW (ref 36.0–46.0)
HEMOGLOBIN: 10.9 g/dL — AB (ref 12.0–15.0)
MCH: 27.2 pg (ref 26.0–34.0)
MCHC: 31 g/dL (ref 30.0–36.0)
MCV: 87.8 fL (ref 78.0–100.0)
Platelets: 581 10*3/uL — ABNORMAL HIGH (ref 150–400)
RBC: 4.01 MIL/uL (ref 3.87–5.11)
WBC: 8 10*3/uL (ref 4.0–10.5)

## 2016-10-01 LAB — PREPARE RBC (CROSSMATCH)

## 2016-10-01 SURGERY — HYSTERECTOMY, VAGINAL, LAPAROSCOPY-ASSISTED, WITH SALPINGECTOMY
Anesthesia: General | Site: Abdomen | Laterality: Bilateral

## 2016-10-01 MED ORDER — SODIUM CHLORIDE 0.9 % IJ SOLN
INTRAMUSCULAR | Status: AC
  Filled 2016-10-01: qty 10

## 2016-10-01 MED ORDER — DIPHENHYDRAMINE HCL 50 MG/ML IJ SOLN
12.5000 mg | Freq: Four times a day (QID) | INTRAMUSCULAR | Status: DC | PRN
Start: 2016-10-01 — End: 2016-10-02

## 2016-10-01 MED ORDER — DEXTROSE 5 % IV SOLN
INTRAVENOUS | Status: AC
  Administered 2016-10-01: 115 mL via INTRAVENOUS
  Filled 2016-10-01: qty 9.25

## 2016-10-01 MED ORDER — FENTANYL CITRATE (PF) 100 MCG/2ML IJ SOLN
INTRAMUSCULAR | Status: DC | PRN
  Administered 2016-10-01: 50 ug via INTRAVENOUS
  Administered 2016-10-01 (×3): 100 ug via INTRAVENOUS

## 2016-10-01 MED ORDER — OXYCODONE HCL 5 MG PO TABS: 5.0000 mg | ORAL_TABLET | Freq: Once | ORAL | Status: DC | PRN

## 2016-10-01 MED ORDER — SCOPOLAMINE 1 MG/3DAYS TD PT72
MEDICATED_PATCH | TRANSDERMAL | Status: AC
  Administered 2016-10-01: 1.5 mg via TRANSDERMAL
  Filled 2016-10-01: qty 1

## 2016-10-01 MED ORDER — SODIUM CHLORIDE 0.9% FLUSH: 9.0000 mL | INTRAVENOUS | Status: DC | PRN

## 2016-10-01 MED ORDER — LABETALOL HCL 5 MG/ML IV SOLN
INTRAVENOUS | Status: AC
  Filled 2016-10-01: qty 4

## 2016-10-01 MED ORDER — SUGAMMADEX SODIUM 200 MG/2ML IV SOLN
INTRAVENOUS | Status: AC
  Filled 2016-10-01: qty 2

## 2016-10-01 MED ORDER — OXYCODONE HCL 5 MG/5ML PO SOLN: 5.0000 mg | Freq: Once | ORAL | Status: DC | PRN

## 2016-10-01 MED ORDER — KETOROLAC TROMETHAMINE 30 MG/ML IJ SOLN
INTRAMUSCULAR | Status: AC
  Filled 2016-10-01: qty 1

## 2016-10-01 MED ORDER — METOCLOPRAMIDE HCL 5 MG/ML IJ SOLN
10.0000 mg | Freq: Once | INTRAMUSCULAR | Status: AC
  Administered 2016-10-01: 10 mg via INTRAVENOUS

## 2016-10-01 MED ORDER — SOD CITRATE-CITRIC ACID 500-334 MG/5ML PO SOLN
ORAL | Status: AC
  Administered 2016-10-01: 30 mL via ORAL
  Filled 2016-10-01: qty 15

## 2016-10-01 MED ORDER — SIMETHICONE 80 MG PO CHEW
80.0000 mg | CHEWABLE_TABLET | Freq: Four times a day (QID) | ORAL | Status: DC | PRN
  Filled 2016-10-01: qty 1

## 2016-10-01 MED ORDER — LEVOTHYROXINE SODIUM 175 MCG PO TABS
175.0000 ug | ORAL_TABLET | Freq: Every day | ORAL | Status: DC
  Administered 2016-10-02: 175 ug via ORAL
  Filled 2016-10-01 (×2): qty 1

## 2016-10-01 MED ORDER — KETOROLAC TROMETHAMINE 30 MG/ML IJ SOLN
30.0000 mg | Freq: Four times a day (QID) | INTRAMUSCULAR | Status: AC
  Filled 2016-10-01: qty 1

## 2016-10-01 MED ORDER — SODIUM CHLORIDE 0.9 % IJ SOLN
INTRAMUSCULAR | Status: DC | PRN
  Administered 2016-10-01: 10 mL

## 2016-10-01 MED ORDER — HYDROCODONE-ACETAMINOPHEN 5-325 MG PO TABS
1.0000 | ORAL_TABLET | ORAL | Status: DC | PRN
  Administered 2016-10-02 (×2): 2 via ORAL
  Filled 2016-10-01 (×2): qty 1
  Filled 2016-10-01: qty 2

## 2016-10-01 MED ORDER — KETOROLAC TROMETHAMINE 30 MG/ML IJ SOLN
30.0000 mg | Freq: Four times a day (QID) | INTRAMUSCULAR | Status: AC
  Administered 2016-10-01 – 2016-10-02 (×2): 30 mg via INTRAVENOUS
  Filled 2016-10-01: qty 1

## 2016-10-01 MED ORDER — PROMETHAZINE HCL 25 MG/ML IJ SOLN
INTRAMUSCULAR | Status: AC
  Filled 2016-10-01: qty 1

## 2016-10-01 MED ORDER — SENNOSIDES-DOCUSATE SODIUM 8.6-50 MG PO TABS
1.0000 | ORAL_TABLET | Freq: Every evening | ORAL | Status: DC | PRN
  Filled 2016-10-01: qty 1

## 2016-10-01 MED ORDER — MENTHOL 3 MG MT LOZG: 1.0000 | LOZENGE | OROMUCOSAL | Status: DC | PRN

## 2016-10-01 MED ORDER — BUPIVACAINE HCL (PF) 0.25 % IJ SOLN
INTRAMUSCULAR | Status: AC
  Filled 2016-10-01: qty 30

## 2016-10-01 MED ORDER — ONDANSETRON HCL 4 MG/2ML IJ SOLN
INTRAMUSCULAR | Status: AC
  Filled 2016-10-01: qty 2

## 2016-10-01 MED ORDER — ROCURONIUM BROMIDE 100 MG/10ML IV SOLN
INTRAVENOUS | Status: DC | PRN
  Administered 2016-10-01: 50 mg via INTRAVENOUS

## 2016-10-01 MED ORDER — DEXAMETHASONE SODIUM PHOSPHATE 4 MG/ML IJ SOLN
INTRAMUSCULAR | Status: AC
  Filled 2016-10-01: qty 1

## 2016-10-01 MED ORDER — SODIUM CHLORIDE 0.9 % IJ SOLN
INTRAMUSCULAR | Status: AC
  Filled 2016-10-01: qty 50

## 2016-10-01 MED ORDER — LACTATED RINGERS IR SOLN
Status: DC | PRN
  Administered 2016-10-01: 3000 mL

## 2016-10-01 MED ORDER — KETOROLAC TROMETHAMINE 30 MG/ML IJ SOLN
INTRAMUSCULAR | Status: DC | PRN
  Administered 2016-10-01: 30 mg via INTRAVENOUS

## 2016-10-01 MED ORDER — LACTATED RINGERS IV SOLN
INTRAVENOUS | Status: DC
  Administered 2016-10-01: 14:00:00 via INTRAVENOUS
  Administered 2016-10-01: 100 mL/h via INTRAVENOUS

## 2016-10-01 MED ORDER — BUPIVACAINE HCL (PF) 0.25 % IJ SOLN
INTRAMUSCULAR | Status: DC | PRN
  Administered 2016-10-01: 6 mL
  Administered 2016-10-01: 4 mL

## 2016-10-01 MED ORDER — FENTANYL CITRATE (PF) 250 MCG/5ML IJ SOLN
INTRAMUSCULAR | Status: AC
  Filled 2016-10-01: qty 5

## 2016-10-01 MED ORDER — METOCLOPRAMIDE HCL 5 MG/ML IJ SOLN
INTRAMUSCULAR | Status: AC
  Filled 2016-10-01: qty 2

## 2016-10-01 MED ORDER — PROPOFOL 10 MG/ML IV BOLUS
INTRAVENOUS | Status: AC
  Filled 2016-10-01: qty 20

## 2016-10-01 MED ORDER — ROCURONIUM BROMIDE 100 MG/10ML IV SOLN
INTRAVENOUS | Status: AC
  Filled 2016-10-01: qty 5

## 2016-10-01 MED ORDER — LIDOCAINE HCL (CARDIAC) 20 MG/ML IV SOLN
INTRAVENOUS | Status: AC
  Filled 2016-10-01: qty 5

## 2016-10-01 MED ORDER — PROMETHAZINE HCL 25 MG/ML IJ SOLN
6.2500 mg | INTRAMUSCULAR | Status: DC | PRN
  Administered 2016-10-01: 12.5 mg via INTRAVENOUS

## 2016-10-01 MED ORDER — LABETALOL HCL 5 MG/ML IV SOLN
INTRAVENOUS | Status: DC | PRN
  Administered 2016-10-01 (×2): 5 mg via INTRAVENOUS

## 2016-10-01 MED ORDER — LAMOTRIGINE 100 MG PO TABS
100.0000 mg | ORAL_TABLET | Freq: Every day | ORAL | Status: DC
  Administered 2016-10-01: 100 mg via ORAL
  Filled 2016-10-01 (×2): qty 1

## 2016-10-01 MED ORDER — NALOXONE HCL 0.4 MG/ML IJ SOLN
0.4000 mg | INTRAMUSCULAR | Status: DC | PRN
Start: 2016-10-01 — End: 2016-10-02

## 2016-10-01 MED ORDER — SUGAMMADEX SODIUM 200 MG/2ML IV SOLN
INTRAVENOUS | Status: DC | PRN
  Administered 2016-10-01: 150 mg via INTRAVENOUS

## 2016-10-01 MED ORDER — LIDOCAINE HCL 1 % IJ SOLN
INTRAMUSCULAR | Status: AC
  Filled 2016-10-01: qty 10

## 2016-10-01 MED ORDER — ESTRADIOL 0.1 MG/GM VA CREA
TOPICAL_CREAM | VAGINAL | Status: AC
  Filled 2016-10-01: qty 42.5

## 2016-10-01 MED ORDER — DEXAMETHASONE SODIUM PHOSPHATE 10 MG/ML IJ SOLN
INTRAMUSCULAR | Status: DC | PRN
  Administered 2016-10-01: 4 mg via INTRAVENOUS

## 2016-10-01 MED ORDER — MIDAZOLAM HCL 2 MG/2ML IJ SOLN
INTRAMUSCULAR | Status: DC | PRN
  Administered 2016-10-01: 2 mg via INTRAVENOUS

## 2016-10-01 MED ORDER — VASOPRESSIN 20 UNIT/ML IV SOLN
INTRAVENOUS | Status: AC
  Filled 2016-10-01: qty 1

## 2016-10-01 MED ORDER — HYDROMORPHONE HCL 1 MG/ML IJ SOLN
INTRAMUSCULAR | Status: DC | PRN
  Administered 2016-10-01 (×2): 0.5 mg via INTRAVENOUS

## 2016-10-01 MED ORDER — SODIUM CHLORIDE 0.9 % IV SOLN: Freq: Once | INTRAVENOUS | Status: DC

## 2016-10-01 MED ORDER — SOD CITRATE-CITRIC ACID 500-334 MG/5ML PO SOLN
30.0000 mL | ORAL | Status: AC
  Administered 2016-10-01: 30 mL via ORAL

## 2016-10-01 MED ORDER — HYDROMORPHONE 1 MG/ML IV SOLN
INTRAVENOUS | Status: DC
  Administered 2016-10-01: 1.8 mg via INTRAVENOUS
  Administered 2016-10-01: 0.4 mL via INTRAVENOUS
  Administered 2016-10-01: 14:00:00 via INTRAVENOUS
  Administered 2016-10-02: 0.4 mg via INTRAVENOUS
  Administered 2016-10-02: 0.6 mg via INTRAVENOUS
  Administered 2016-10-02: 0.4 mg via INTRAVENOUS
  Filled 2016-10-01: qty 25

## 2016-10-01 MED ORDER — LIDOCAINE HCL (PF) 1 % IJ SOLN
INTRAMUSCULAR | Status: AC
  Filled 2016-10-01: qty 5

## 2016-10-01 MED ORDER — MEPERIDINE HCL 25 MG/ML IJ SOLN: 6.2500 mg | INTRAMUSCULAR | Status: DC | PRN

## 2016-10-01 MED ORDER — MIDAZOLAM HCL 2 MG/2ML IJ SOLN
INTRAMUSCULAR | Status: AC
  Filled 2016-10-01: qty 2

## 2016-10-01 MED ORDER — HYDROMORPHONE HCL 1 MG/ML IJ SOLN
INTRAMUSCULAR | Status: AC
  Filled 2016-10-01: qty 1

## 2016-10-01 MED ORDER — HYDROMORPHONE HCL 1 MG/ML IJ SOLN
0.2500 mg | INTRAMUSCULAR | Status: DC | PRN
  Administered 2016-10-01 (×3): 0.5 mg via INTRAVENOUS

## 2016-10-01 MED ORDER — LACTATED RINGERS IV SOLN
INTRAVENOUS | Status: DC
  Administered 2016-10-01 (×2): via INTRAVENOUS
  Administered 2016-10-01: 100 mL/h via INTRAVENOUS
  Administered 2016-10-01: 10:00:00 via INTRAVENOUS

## 2016-10-01 MED ORDER — ONDANSETRON HCL 4 MG/2ML IJ SOLN
INTRAMUSCULAR | Status: DC | PRN
  Administered 2016-10-01: 4 mg via INTRAVENOUS

## 2016-10-01 MED ORDER — ONDANSETRON HCL 4 MG/2ML IJ SOLN
4.0000 mg | Freq: Four times a day (QID) | INTRAMUSCULAR | Status: DC | PRN
  Administered 2016-10-01: 4 mg via INTRAVENOUS
  Filled 2016-10-01: qty 2

## 2016-10-01 MED ORDER — IBUPROFEN 600 MG PO TABS
600.0000 mg | ORAL_TABLET | Freq: Four times a day (QID) | ORAL | Status: DC | PRN
  Administered 2016-10-02: 600 mg via ORAL
  Filled 2016-10-01: qty 1

## 2016-10-01 MED ORDER — FENTANYL CITRATE (PF) 100 MCG/2ML IJ SOLN
INTRAMUSCULAR | Status: AC
  Filled 2016-10-01: qty 2

## 2016-10-01 MED ORDER — VASOPRESSIN 20 UNIT/ML IV SOLN
INTRAVENOUS | Status: DC | PRN
  Administered 2016-10-01: 17 mL via INTRAMUSCULAR

## 2016-10-01 MED ORDER — HYDROMORPHONE HCL 1 MG/ML IJ SOLN
INTRAMUSCULAR | Status: AC
Start: 2016-10-01 — End: 2016-10-01
  Filled 2016-10-01: qty 1

## 2016-10-01 MED ORDER — LORATADINE 10 MG PO TABS
10.0000 mg | ORAL_TABLET | Freq: Every day | ORAL | Status: DC
  Filled 2016-10-01 (×3): qty 1

## 2016-10-01 MED ORDER — PROPOFOL 10 MG/ML IV BOLUS
INTRAVENOUS | Status: DC | PRN
  Administered 2016-10-01: 150 mg via INTRAVENOUS

## 2016-10-01 MED ORDER — SCOPOLAMINE 1 MG/3DAYS TD PT72
1.0000 | MEDICATED_PATCH | Freq: Once | TRANSDERMAL | Status: DC
  Administered 2016-10-01: 1.5 mg via TRANSDERMAL

## 2016-10-01 MED ORDER — LIDOCAINE HCL (CARDIAC) 20 MG/ML IV SOLN
INTRAVENOUS | Status: DC | PRN
  Administered 2016-10-01: 50 mg via INTRAVENOUS

## 2016-10-01 MED ORDER — DIPHENHYDRAMINE HCL 12.5 MG/5ML PO ELIX
12.5000 mg | ORAL_SOLUTION | Freq: Four times a day (QID) | ORAL | Status: DC | PRN
  Filled 2016-10-01: qty 5

## 2016-10-01 SURGICAL SUPPLY — 39 items
CABLE HIGH FREQUENCY MONO STRZ (ELECTRODE) IMPLANT
CATH ROBINSON RED A/P 16FR (CATHETERS) IMPLANT
CLOTH BEACON ORANGE TIMEOUT ST (SAFETY) ×2 IMPLANT
CONT PATH 16OZ SNAP LID 3702 (MISCELLANEOUS) ×2 IMPLANT
COVER BACK TABLE 60X90IN (DRAPES) ×2 IMPLANT
COVER MAYO STAND STRL (DRAPES) IMPLANT
DECANTER SPIKE VIAL GLASS SM (MISCELLANEOUS) ×6 IMPLANT
DRSG OPSITE POSTOP 3X4 (GAUZE/BANDAGES/DRESSINGS) IMPLANT
DURAPREP 26ML APPLICATOR (WOUND CARE) ×2 IMPLANT
ELECT REM PT RETURN 9FT ADLT (ELECTROSURGICAL) ×2
ELECTRODE REM PT RTRN 9FT ADLT (ELECTROSURGICAL) ×1 IMPLANT
FILTER SMOKE EVAC LAPAROSHD (FILTER) IMPLANT
GLOVE BIO SURGEON STRL SZ 6.5 (GLOVE) ×6 IMPLANT
GLOVE BIOGEL PI IND STRL 6.5 (GLOVE) ×1 IMPLANT
GLOVE BIOGEL PI IND STRL 7.0 (GLOVE) ×2 IMPLANT
GLOVE BIOGEL PI INDICATOR 6.5 (GLOVE) ×1
GLOVE BIOGEL PI INDICATOR 7.0 (GLOVE) ×2
LEGGING LITHOTOMY PAIR STRL (DRAPES) ×2 IMPLANT
NEEDLE INSUFFLATION 120MM (ENDOMECHANICALS) ×2 IMPLANT
NS IRRIG 1000ML POUR BTL (IV SOLUTION) ×2 IMPLANT
PACK LAVH (CUSTOM PROCEDURE TRAY) ×2 IMPLANT
PACK ROBOTIC GOWN (GOWN DISPOSABLE) ×2 IMPLANT
PACK TRENDGUARD 450 HYBRID PRO (MISCELLANEOUS) ×1 IMPLANT
PROTECTOR NERVE ULNAR (MISCELLANEOUS) ×4 IMPLANT
SET IRRIG TUBING LAPAROSCOPIC (IRRIGATION / IRRIGATOR) ×2 IMPLANT
SHEARS HARMONIC ACE PLUS 36CM (ENDOMECHANICALS) ×2 IMPLANT
SLEEVE XCEL OPT CAN 5 100 (ENDOMECHANICALS) ×2 IMPLANT
SUT SILK 0 FSL (SUTURE) ×2 IMPLANT
SUT VIC AB 0 CT1 18XCR BRD8 (SUTURE) ×2 IMPLANT
SUT VIC AB 0 CT1 8-18 (SUTURE) ×2
SUT VIC AB 2-0 CT1 (SUTURE) ×2 IMPLANT
SUT VICRYL 0 TIES 12 18 (SUTURE) IMPLANT
SUT VICRYL 0 UR6 27IN ABS (SUTURE) IMPLANT
SUT VICRYL 4-0 PS2 18IN ABS (SUTURE) ×2 IMPLANT
TOWEL OR 17X24 6PK STRL BLUE (TOWEL DISPOSABLE) ×4 IMPLANT
TRAY FOLEY CATH SILVER 14FR (SET/KITS/TRAYS/PACK) ×2 IMPLANT
TRENDGUARD 450 HYBRID PRO PACK (MISCELLANEOUS) ×2
TROCAR XCEL NON-BLD 5MMX100MML (ENDOMECHANICALS) ×2 IMPLANT
WARMER LAPAROSCOPE (MISCELLANEOUS) ×2 IMPLANT

## 2016-10-01 NOTE — Discharge Summary (Signed)
Physician Discharge Summary  Patient ID: Amanda Henry MRN: [id number] DOB/AGE: October 29, 1965 51 y.o.  Admit date: 10/01/2016 Discharge date: 10/02/2016  Admission Diagnoses: Menorrhagia Anemia Fibroid uterus  Discharge Diagnoses:  Active Problems:   S/P laparoscopic assisted vaginal hysterectomy (LAVH) and bilateral salpingectomies  Discharged Condition: good  Hospital Course: Pt admitted to observation after LAVH/Bilateral salpingectomies for menorrhagia, anemia and had a non-eventful postoperative course.   On postoperative day 1 she was tolerating po meds, a regular diet, and able to void and ambulate.  Consults: None  Significant Diagnostic Studies: labs: CBC  Treatments: surgery: LAVH/BS  Discharge Exam: Blood pressure (!) 143/67, pulse 76, temperature 98.9 F (37.2 C), temperature source Oral, resp. rate 16, height 5\' 7"  (1.702 m), weight 90.3 kg (199 lb), SpO2 100 %. General appearance: alert and cooperative Resp: clear to auscultation bilaterally Cardio: regular rate and rhythm GI: soft NT and incisions well-approximated  Disposition: 01-Home or Self Care  Discharge Instructions    Diet - low sodium heart healthy    Complete by:  As directed    Discharge instructions    Complete by:  As directed    Avoid driving until off narcotic pain meds.  No heavy lifting greater than 10 lbs for 6 weeks.  Nothing in vagina for 2 weeks. Shower over incision and pat dry.   Increase activity slowly    Complete by:  As directed      Allergies as of 10/02/2016      Reactions   Latex Rash   Penicillins Rash   Has patient had a PCN reaction causing immediate rash, facial/tongue/throat swelling, SOB or lightheadedness with hypotension: Yes Has patient had a PCN reaction causing severe rash involving mucus membranes or skin necrosis: No Has patient had a PCN reaction that required hospitalization No Has patient had a PCN reaction occurring within the last 10 years: No If all of  the above answers are "NO", then may proceed with Cephalosporin use.      Medication List    STOP taking these medications   medroxyPROGESTERone 5 MG tablet Commonly known as:  PROVERA     TAKE these medications   ALPRAZolam 1 MG 24 hr tablet Commonly known as:  XANAX XR Take 1 mg by mouth at bedtime.   cetirizine 10 MG tablet Commonly known as:  ZYRTEC Take 10 mg by mouth every evening.   HYDROcodone-acetaminophen 5-325 MG tablet Commonly known as:  NORCO/VICODIN Take 1-2 tablets by mouth every 4 (four) hours as needed for moderate pain.   ibuprofen 600 MG tablet Commonly known as:  ADVIL,MOTRIN Take 1 tablet (600 mg total) by mouth every 6 (six) hours as needed (mild pain). What changed:  medication strength  how much to take  when to take this  reasons to take this   lamoTRIgine 100 MG tablet Commonly known as:  LAMICTAL Take 100 mg by mouth at bedtime.   levothyroxine 175 MCG tablet Commonly known as:  SYNTHROID, LEVOTHROID Take 175 mcg by mouth daily before breakfast.      Follow-up Information    Paula Compton, MD. Schedule an appointment as soon as possible for a visit in 2 week(s).   Specialty:  Obstetrics and Gynecology Why:  incision check Contact information: [place] [address] [phone]           Signed: Logan Bores 10/02/2016, 8:52 AM

## 2016-10-01 NOTE — Anesthesia Postprocedure Evaluation (Signed)
Anesthesia Post Note  Patient: Amanda Henry  Procedure(s) Performed: Procedure(s) (LRB): LAPAROSCOPIC ASSISTED VAGINAL HYSTERECTOMY WITH SALPINGECTOMY (Bilateral)  Patient location during evaluation: PACU Anesthesia Type: General Level of consciousness: awake and alert Pain management: pain level controlled Vital Signs Assessment: post-procedure vital signs reviewed and stable Respiratory status: spontaneous breathing, nonlabored ventilation and respiratory function stable Cardiovascular status: blood pressure returned to baseline and stable Postop Assessment: no signs of nausea or vomiting Anesthetic complications: no        Last Vitals:  Vitals:   10/01/16 1100 10/01/16 1115  BP: (!) 158/79 (!) 161/85  Pulse: 69 70  Resp: 16 (!) 21  Temp:      Last Pain:  Vitals:   10/01/16 1100  TempSrc:   PainSc: Asleep   Pain Goal: Patients Stated Pain Goal: 3 (10/01/16 1045)               Lynda Rainwater

## 2016-10-01 NOTE — Anesthesia Preprocedure Evaluation (Signed)
Anesthesia Evaluation  Patient identified by MRN, date of birth, ID band Patient awake    Reviewed: Allergy & Precautions, NPO status , Patient's Chart, lab work & pertinent test results  Airway Mallampati: II  TM Distance: >3 FB Neck ROM: Full    Dental no notable dental hx.    Pulmonary neg pulmonary ROS,    Pulmonary exam normal breath sounds clear to auscultation       Cardiovascular hypertension, negative cardio ROS Normal cardiovascular exam Rhythm:Regular Rate:Normal     Neuro/Psych Anxiety Depression negative neurological ROS  negative psych ROS   GI/Hepatic negative GI ROS, Neg liver ROS,   Endo/Other  negative endocrine ROS  Renal/GU negative Renal ROS  negative genitourinary   Musculoskeletal negative musculoskeletal ROS (+)   Abdominal (+) + obese,   Peds negative pediatric ROS (+)  Hematology negative hematology ROS (+) anemia ,   Anesthesia Other Findings   Reproductive/Obstetrics negative OB ROS                             Anesthesia Physical Anesthesia Plan  ASA: II  Anesthesia Plan: General   Post-op Pain Management:    Induction: Intravenous  Airway Management Planned: Oral ETT  Additional Equipment:   Intra-op Plan:   Post-operative Plan: Extubation in OR  Informed Consent: I have reviewed the patients History and Physical, chart, labs and discussed the procedure including the risks, benefits and alternatives for the proposed anesthesia with the patient or authorized representative who has indicated his/her understanding and acceptance.   Dental advisory given  Plan Discussed with: CRNA  Anesthesia Plan Comments:         Anesthesia Quick Evaluation

## 2016-10-01 NOTE — Transfer of Care (Signed)
Immediate Anesthesia Transfer of Care Note  Patient: Amanda Henry  Procedure(s) Performed: Procedure(s): LAPAROSCOPIC ASSISTED VAGINAL HYSTERECTOMY WITH SALPINGECTOMY (Bilateral)  Patient Location: PACU  Anesthesia Type:General  Level of Consciousness: awake, alert  and sedated  Airway & Oxygen Therapy: Patient Spontanous Breathing and Patient connected to nasal cannula oxygen  Post-op Assessment: Report given to RN and Post -op Vital signs reviewed and stable  Post vital signs: Reviewed and stable  Last Vitals:  Vitals:   10/01/16 0714 10/01/16 1028  BP: (!) 136/94   Pulse: 73   Resp: 16   Temp: 36.8 C 36.8 C    Last Pain:  Vitals:   10/01/16 0714  TempSrc: Oral      Patients Stated Pain Goal: 3 (38/33/38 3291)  Complications: No apparent anesthesia complications

## 2016-10-01 NOTE — Progress Notes (Signed)
Patient ID: Amanda Henry, female   DOB: 09-Apr-1966, 51 y.o.   MRN: [id number] Pt here for suregery and Hgb stable at 10.9 this AM.  Brief exam WNL and ready to proceed.   Will have 2 units type and crossed just in case.

## 2016-10-01 NOTE — Progress Notes (Signed)
Day of Surgery Procedure(s) (LRB): LAPAROSCOPIC ASSISTED VAGINAL HYSTERECTOMY WITH SALPINGECTOMY (Bilateral)  Subjective: Patient reports nausea no vomiting. Pain controlled with meds;received toradol 74mins ago. She has no complaints. Has not ambulated yet. Voiding well via foley  Objective: I have reviewed patient's vital signs and intake and output    General: alert, cooperative and no distress GI: soft, non-tender; bowel sounds normal; no masses,  no organomegaly and incision: dressing c/d/i Extremities: extremities normal, atraumatic, no cyanosis or edema  Assessment: s/p Procedure(s): LAPAROSCOPIC ASSISTED VAGINAL HYSTERECTOMY WITH SALPINGECTOMY (Bilateral): stable and BP elevated; recheck in an hour post toradol. Notify physician if elevated  Routine post op care Ambulate with assistance before bedtime  Plan: stable and BP elevated  LOS: 0 days    Amanda Henry 10/01/2016, 6:31 PM

## 2016-10-01 NOTE — Op Note (Signed)
Operative Note    Preoperative Diagnosis Menorrhagia Anemia Fibroids   Postoperative Diagnosis same  Procedure Laparoscopic assisted vaginal hysterectomy and bilateral salpingectomies  Surgeon Paula Compton, MD  Anesthesia GETA  Fluids: EBL  829ml UOP 752ml clear IVF 2166ml LR  Findings Enlarged boggy uterus about 10-11 weeks size, tubes and ovaries WNL.  Appendix WNL.  Four quadrant inspection WNL.  Specimen Uterus, tubes and ovaries  Procedure Note Patient was taken to the operating room where general anesthesia was obtained without difficulty. She was then prepped and draped in the normal sterile fashion in the dorsal lithotomy position. An appropriate timeout was performed. A speculum was then placed within the vagina and a Hulka tenaculum placed within the cervix for uterine manipulation. A foley catheter was placed in the bladder.  Attention was then turned to the patient's abdomen after draping where the infraumbilical area was injected with approximately 10 cc of quarter percent Marcaine. A 1 cm incision was then made within the umbilicus and the varies needle easily introduced into the peritoneal cavity. Intraperitoneal placement was confirmed by aspiration and injection with normal saline. Gas flow was then applied and a pneumoperitoneum obtained with approximate 3 L of CO2 gas. The varies needle was then removed and a 5 mm optiview trocar was easily introduced into the abdomen under direct visualization. . With patient in Trendelenburg the uterus and tubes and ovaries were inspected with findings as previously stated. Two additional trocars were placed in the upper lateral quadrants under direct visualization after injection with quarter percent marcaine.  The Harmonic scalpel was then utilized to dissect the fallopian tubes from the mesosalpinx bilaterally down to the level of the cornua.  The remainder of the uteroovarian ligament and the round ligament were then  also taken down with the Harmonic to the level of the bladder flap.  The bladder flap was taken down from the lower uterine segment and pushed away to expose the cervix.  An area of bleeding at the right uterine artery was controlled with the Harmonic scalpel.  Attention was then turned to the vagina after all instruments were removed and the trocars covered with a sterile drape. The cervix was grasped with Yates Decamp tenaculums x 2 and injected with a dilute solution of Pitressin circumferentially.  The bovie was then used to make a circumferential incision.  The mayo scissors then further dissected the vaginal mucosa from the underlying cervix and the anterior and posterior cul de sac entered sharply.  With a banana speculum and deaver retractor isolating the uterus from the bladder and rectum.  The uterosacral ligaments and paracervical tissue was taken down sequentially with parametrial clamps and suture ligated with zero vicryl at each step.  When  the was uterus freed bilaterally , it was then delivered from the vagina.    It was handed off to pathology.   A small amount of bleeding on the right pedicle was controlled with several figure of eight sutures of zero vicryl and all was hemostatic. The uterosacral ligaments were approximated with zero vicryl.  The short weighted speculum was placed and the cuff run with a running locked 2-0 vicryl for hemostasis. All instruments were then removed from the vagina.  Gowns and gloves were changed and attention was returned to the abdomen, where pneumoperitoneum was again obtained and all inspected.  The cuff and pedicles were hemostatic and the ureters visualized and normal in appearance. A four quadrant view of the pelvis and abdomen was performed and found to be  normal with no bleeding or injuries noted.  The instruments were removed from the abdomen as well as the 5 mm lateral ports under visualization.  The pneumoperitoneum was reduced through the trocar. The trocar  was finally removed and the infraumbilical incision and lateral incisions were closed with a subcuticular stitch of 3-0 Vicryl. Dermabond and a bandage were placed. Patient was then awakened and taken to the recovery room in good condition.

## 2016-10-01 NOTE — Anesthesia Procedure Notes (Signed)
Procedure Name: Intubation Date/Time: 10/01/2016 8:30 AM Performed by: Bufford Spikes Pre-anesthesia Checklist: Patient identified, Emergency Drugs available, Suction available and Patient being monitored Patient Re-evaluated:Patient Re-evaluated prior to inductionOxygen Delivery Method: Circle system utilized Preoxygenation: Pre-oxygenation with 100% oxygen Intubation Type: IV induction Ventilation: Mask ventilation without difficulty Laryngoscope Size: Miller and 2 Grade View: Grade II Tube type: Oral Tube size: 7.0 mm Number of attempts: 1 Airway Equipment and Method: Stylet Placement Confirmation: ETT inserted through vocal cords under direct vision,  positive ETCO2 and breath sounds checked- equal and bilateral Secured at: 21 cm Tube secured with: Tape Dental Injury: Teeth and Oropharynx as per pre-operative assessment

## 2016-10-02 ENCOUNTER — Encounter (HOSPITAL_COMMUNITY): Payer: Self-pay | Admitting: Obstetrics and Gynecology

## 2016-10-02 DIAGNOSIS — N920 Excessive and frequent menstruation with regular cycle: Secondary | ICD-10-CM | POA: Diagnosis not present

## 2016-10-02 LAB — CBC
HEMATOCRIT: 31.6 % — AB (ref 36.0–46.0)
Hemoglobin: 9.8 g/dL — ABNORMAL LOW (ref 12.0–15.0)
MCH: 27.1 pg (ref 26.0–34.0)
MCHC: 31 g/dL (ref 30.0–36.0)
MCV: 87.3 fL (ref 78.0–100.0)
Platelets: 552 10*3/uL — ABNORMAL HIGH (ref 150–400)
RBC: 3.62 MIL/uL — ABNORMAL LOW (ref 3.87–5.11)
WBC: 10.8 10*3/uL — ABNORMAL HIGH (ref 4.0–10.5)

## 2016-10-02 LAB — BASIC METABOLIC PANEL
Anion gap: 6 (ref 5–15)
BUN: 8 mg/dL (ref 6–20)
CALCIUM: 10.5 mg/dL — AB (ref 8.9–10.3)
CO2: 25 mmol/L (ref 22–32)
CREATININE: 0.69 mg/dL (ref 0.44–1.00)
Chloride: 104 mmol/L (ref 101–111)
GFR calc non Af Amer: >60 mL/min (ref >60)
GLUCOSE: 119 mg/dL — AB (ref 65–99)
Potassium: 3.7 mmol/L (ref 3.5–5.1)
Sodium: 135 mmol/L (ref 135–145)

## 2016-10-02 MED ORDER — HYDROCODONE-ACETAMINOPHEN 5-325 MG PO TABS: 1.0000 | ORAL_TABLET | ORAL | 0 refills | Status: DC | PRN

## 2016-10-02 MED ORDER — IBUPROFEN 600 MG PO TABS: 600.0000 mg | ORAL_TABLET | Freq: Four times a day (QID) | ORAL | 0 refills | Status: DC | PRN

## 2016-10-02 NOTE — Progress Notes (Signed)
Pt discharged to home with mother.  Condition stable.  Pt ambulated to car with Astrid Divine, NT.  No equipment for home ordered at discharge.

## 2016-10-02 NOTE — Progress Notes (Signed)
Patient ID: Amanda Henry, female   DOB: 12-11-1965, 51 y.o.   MRN: [id number] BP improving. Will continue to monitor

## 2016-10-02 NOTE — Progress Notes (Addendum)
1 Day Post-Op Procedure(s) (LRB): LAPAROSCOPIC ASSISTED VAGINAL HYSTERECTOMY WITH SALPINGECTOMY (Bilateral)  Subjective: Patient reports tolerating PO.  Pain is well-controlled and has ambulated.  Foley just out over last hour  Objective: I have reviewed patient's vital signs, intake and output and labs.  General: alert and cooperative GI: soft, NT incisions well-approximated  Assessment: s/p Procedure(s): LAPAROSCOPIC ASSISTED VAGINAL HYSTERECTOMY WITH SALPINGECTOMY (Bilateral): progressing well  Plan: Discharge home this PM if tolerates po meds and voids without problem D/w pt BP has been a bit elevated and we will follow this up in the office and with her PMD.  Improved this AM  LOS: 0 days    Logan Bores 10/02/2016, 8:48 AM

## 2016-10-04 LAB — TYPE AND SCREEN
ABO/RH(D): O POS
Antibody Screen: NEGATIVE
UNIT DIVISION: 0
Unit division: 0

## 2016-10-04 LAB — BPAM RBC
BLOOD PRODUCT EXPIRATION DATE: 201806142359
Blood Product Expiration Date: 201806142359
UNIT TYPE AND RH: 5100
UNIT TYPE AND RH: 5100

## 2018-04-06 ENCOUNTER — Other Ambulatory Visit: Payer: Self-pay | Admitting: Nurse Practitioner

## 2018-04-23 ENCOUNTER — Telehealth: Payer: Self-pay

## 2018-04-23 NOTE — Telephone Encounter (Signed)
Patient left v/m stating she wants to schedule an appointment with [name] FNP-BC.   Returned pt call unable to leave a v/m. YRL,RMA

## 2018-05-31 ENCOUNTER — Telehealth: Payer: Self-pay

## 2018-05-31 NOTE — Telephone Encounter (Signed)
Patient called requesting an appointment to check her thyroid. YRL,RMA  Returned pt call and left her a v/m to call office to schedule an appointment. YRL,RMA

## 2018-06-03 ENCOUNTER — Encounter: Payer: Self-pay | Admitting: Nurse Practitioner

## 2018-06-03 ENCOUNTER — Ambulatory Visit: Payer: BC Managed Care – PPO | Admitting: Nurse Practitioner

## 2018-06-03 VITALS — BP 140/88 | HR 60 | Temp 98.0°F | Ht 66.0 in | Wt 203.0 lb

## 2018-06-03 DIAGNOSIS — E782 Mixed hyperlipidemia: Secondary | ICD-10-CM | POA: Diagnosis not present

## 2018-06-03 DIAGNOSIS — E039 Hypothyroidism, unspecified: Secondary | ICD-10-CM | POA: Diagnosis not present

## 2018-06-03 DIAGNOSIS — R7303 Prediabetes: Secondary | ICD-10-CM

## 2018-06-03 DIAGNOSIS — F418 Other specified anxiety disorders: Secondary | ICD-10-CM | POA: Diagnosis not present

## 2018-06-03 MED ORDER — FLUOXETINE HCL 60 MG PO TABS: 1.0000 | ORAL_TABLET | Freq: Every day | ORAL | 0 refills | Status: DC

## 2018-06-03 NOTE — Progress Notes (Signed)
Subjective:     Patient ID: Amanda Henry , female    DOB: 09-13-1965 , 53 y.o.   MRN: [id number]   Chief Complaint  Patient presents with  . Hypothyroidism    HPI  Has had partial hysterectomy   Thyroid Problem  Presents for follow-up visit. Symptoms include fatigue. Patient reports no anxiety, constipation, diarrhea, palpitations or weight loss. The symptoms have been stable.  Depression         This is a chronic problem.  The current episode started more than 1 year ago.   The onset quality is gradual.   The problem occurs intermittently.  The problem has been waxing and waning since onset.  Associated symptoms include fatigue and insomnia.  Associated symptoms include no headaches.  Past medical history includes thyroid problem.      Past Medical History:  Diagnosis Date  . Anxiety   . Blood transfusion without reported diagnosis   . Depression   . Hypertension    no medications at this time  . Hypothyroidism   . Iron deficiency anemia   . Thyroid disease      Family History  Problem Relation Age of Onset  . Thyroid disease Mother   . Hypertension Mother   . Diabetes Father   . Hypertension Other   . Diabetes Other   . Thyroid disease Other   . Heart disease Other      Current Outpatient Medications:  .  ALPRAZolam (XANAX XR) 1 MG 24 hr tablet, Take 1 mg by mouth at bedtime. , Disp: , Rfl:  .  cetirizine (ZYRTEC) 10 MG tablet, Take 10 mg by mouth every evening., Disp: , Rfl:  .  ibuprofen (ADVIL,MOTRIN) 600 MG tablet, Take 1 tablet (600 mg total) by mouth every 6 (six) hours as needed (mild pain)., Disp: 30 tablet, Rfl: 0 .  lamoTRIgine (LAMICTAL) 100 MG tablet, Take 100 mg by mouth at bedtime., Disp: , Rfl:  .  levothyroxine (SYNTHROID, LEVOTHROID) 175 MCG tablet, TAKE 1 TABLET BY MOUTH EVERY DAY, Disp: 30 tablet, Rfl: 0   Allergies  Allergen Reactions  . Latex Rash  . Penicillins Rash    Has patient had a PCN reaction causing immediate rash,  facial/tongue/throat swelling, SOB or lightheadedness with hypotension: Yes Has patient had a PCN reaction causing severe rash involving mucus membranes or skin necrosis: No Has patient had a PCN reaction that required hospitalization No Has patient had a PCN reaction occurring within the last 10 years: No If all of the above answers are "NO", then may proceed with Cephalosporin use.      Review of Systems  Constitutional: Positive for fatigue. Negative for weight loss.  Cardiovascular: Negative for chest pain, palpitations and leg swelling.  Gastrointestinal: Negative for constipation and diarrhea.  Endocrine: Negative for polydipsia, polyphagia and polyuria.  Neurological: Negative for dizziness, light-headedness and headaches.  Psychiatric/Behavioral: Positive for depression. The patient has insomnia. The patient is not nervous/anxious.      Today's Vitals   06/03/18 1540  BP: 140/88  Pulse: 60  Temp: 98 F (36.7 C)  TempSrc: Oral  SpO2: 97%  Weight: 203 lb (92.1 kg)  Height: 5\' 6"  (1.676 m)  PainSc: 0-No pain   Body mass index is 32.77 kg/m.   Objective:  Physical Exam Vitals signs reviewed.  Constitutional:      Appearance: Normal appearance.  Cardiovascular:     Rate and Rhythm: Normal rate and regular rhythm.     Pulses: Normal pulses.  Heart sounds: No murmur.  Pulmonary:     Effort: Pulmonary effort is normal.     Breath sounds: Normal breath sounds.  Abdominal:     General: Abdomen is flat. Bowel sounds are normal. There is no distension.     Palpations: Abdomen is soft.     Tenderness: There is no abdominal tenderness.  Skin:    General: Skin is warm and dry.     Capillary Refill: Capillary refill takes less than 2 seconds.  Neurological:     General: No focal deficit present.     Mental Status: She is alert and oriented to person, place, and time.         Assessment And Plan:     1. Depression with anxiety  She is interested in counseling  and starting a medication to help  Depression score is 15  Discussed side effects of fluoxetine, if have suicidal or homicidal thoughts she is to notify me or go to ER - FLUoxetine HCl 60 MG TABS; Take 1 tablet by mouth daily.  Dispense: 30 tablet; Refill: 0 - Ambulatory referral to Psychiatry  2. Acquired hypothyroidism  Chronic  Has not been taking levothyroxine regularly  Stressed the importance and the effects of not taking her meds regularly and risk for heart problems - TSH - T3 - T4, Free  3. Mixed hyperlipidemia  Chronic, controlled  No current medications - Lipid panel  4. Prediabetes  Chronic, controlled  no current medications  Encouraged to limit intake of sugary foods and drinks  Encouraged to increase physical activity to 150 minutes per week - Hemoglobin A1c     [name], FNP

## 2018-06-04 LAB — TSH: TSH: 25.01 u[IU]/mL — ABNORMAL HIGH (ref 0.450–4.500)

## 2018-06-04 LAB — HEMOGLOBIN A1C
ESTIMATED AVERAGE GLUCOSE: 134 mg/dL
HEMOGLOBIN A1C: 6.3 % — AB (ref 4.8–5.6)

## 2018-06-04 LAB — LIPID PANEL
CHOL/HDL RATIO: 4.4 ratio (ref 0.0–4.4)
Cholesterol, Total: 264 mg/dL — ABNORMAL HIGH (ref 100–199)
HDL: 60 mg/dL (ref >39)
LDL Calculated: 172 mg/dL — ABNORMAL HIGH (ref 0–99)
Triglycerides: 158 mg/dL — ABNORMAL HIGH (ref 0–149)
VLDL CHOLESTEROL CAL: 32 mg/dL (ref 5–40)

## 2018-06-04 LAB — T3: T3 TOTAL: 74 ng/dL (ref 71–180)

## 2018-06-04 LAB — T4, FREE: FREE T4: 0.41 ng/dL — AB (ref 0.82–1.77)

## 2018-06-07 ENCOUNTER — Other Ambulatory Visit: Payer: Self-pay | Admitting: Nurse Practitioner

## 2018-06-07 ENCOUNTER — Telehealth: Payer: Self-pay

## 2018-06-07 DIAGNOSIS — E039 Hypothyroidism, unspecified: Secondary | ICD-10-CM

## 2018-06-07 MED ORDER — LEVOTHYROXINE SODIUM 175 MCG PO TABS: 175.0000 ug | ORAL_TABLET | Freq: Every day | ORAL | 0 refills | Status: DC

## 2018-06-07 NOTE — Telephone Encounter (Signed)
I have called pt back and relayed the providers message from the lab notes. Miss Amanda Henry stated the stated she wanted the pt to take Synthroid   156mcg every other day for 2 weeks. Then she is to take it everyday after that for the next 4 weeks and then come in at the 6 week mark for lab visit only. (Around first week in march)  Thanks, Thornton

## 2018-06-15 ENCOUNTER — Encounter: Payer: Self-pay | Admitting: Nurse Practitioner

## 2018-06-17 ENCOUNTER — Telehealth: Payer: Self-pay

## 2018-06-17 NOTE — Telephone Encounter (Signed)
Called pt to see why she is taking lamictal left pt v/m to call office. YRL,RMA

## 2018-07-05 ENCOUNTER — Other Ambulatory Visit: Payer: Self-pay | Admitting: Nurse Practitioner

## 2018-07-05 DIAGNOSIS — E039 Hypothyroidism, unspecified: Secondary | ICD-10-CM

## 2018-08-15 ENCOUNTER — Other Ambulatory Visit: Payer: Self-pay | Admitting: Nurse Practitioner

## 2018-08-15 DIAGNOSIS — E039 Hypothyroidism, unspecified: Secondary | ICD-10-CM

## 2018-09-06 ENCOUNTER — Encounter: Payer: BC Managed Care – PPO | Admitting: Nurse Practitioner

## 2018-09-06 ENCOUNTER — Other Ambulatory Visit: Payer: Self-pay

## 2018-09-06 ENCOUNTER — Ambulatory Visit: Payer: BC Managed Care – PPO | Admitting: Nurse Practitioner

## 2018-09-06 DIAGNOSIS — E039 Hypothyroidism, unspecified: Secondary | ICD-10-CM

## 2018-09-06 DIAGNOSIS — R7303 Prediabetes: Secondary | ICD-10-CM

## 2018-09-06 DIAGNOSIS — E782 Mixed hyperlipidemia: Secondary | ICD-10-CM | POA: Diagnosis not present

## 2018-09-06 DIAGNOSIS — F418 Other specified anxiety disorders: Secondary | ICD-10-CM | POA: Diagnosis not present

## 2018-09-06 MED ORDER — LEVOTHYROXINE SODIUM 175 MCG PO TABS: 175.0000 ug | ORAL_TABLET | Freq: Every day | ORAL | 0 refills | Status: DC

## 2018-09-06 MED ORDER — ESCITALOPRAM OXALATE 10 MG PO TABS: 10.0000 mg | ORAL_TABLET | Freq: Every day | ORAL | 1 refills | Status: DC

## 2018-09-06 NOTE — Progress Notes (Signed)
Virtual Visit via Video Note   This visit type was conducted due to national recommendations for restrictions regarding the COVID-19 Pandemic (e.g. social distancing) in an effort to limit this patient's exposure and mitigate transmission in our community.  This format is felt to be most appropriate for this patient at this time.  All issues noted in this document were discussed and addressed.  No physical exam was performed (except for noted visual exam findings with Video Visits).  Please refer to the patient's chart (MyChart message for video visits and phone note for telephone visits) for the patient's consent to telehealth for [facility].  Date:  09/06/2018   ID:  Amanda Henry, DOB 04-27-1966, MRN [id number]  Patient Location:  Home - spoke with [name]  Provider location:   Office    Chief Complaint:  Follow up thyroid  History of Present Illness:    Amanda Henry is a 53 y.o. female who presents via video conferencing for a telehealth visit today.    The patient does not have symptoms concerning for COVID-19 infection (fever, chills, cough, or new shortness of breath).   She is working out and losing weight. Began feeling better.  She is working from home.    Alprazolam as needed at night Lamotrigine at night escitolpram 10 mg  Thyroid Problem  Presents for follow-up (she is being followed by Dr. Toy Care) visit. Patient reports no anxiety or fatigue. The symptoms have been stable.  Depression         This is a chronic problem.  The current episode started 1 to 4 weeks ago.   The onset quality is gradual. The problem is unchanged.  Associated symptoms include insomnia.  Associated symptoms include no decreased concentration, no fatigue and no headaches.  Past treatments include SSRIs - Selective serotonin reuptake inhibitors (benzodiazepine).  Past medical history includes thyroid problem.      Past Medical History:  Diagnosis Date  . Anxiety   . Blood  transfusion without reported diagnosis   . Depression   . Hypertension    no medications at this time  . Hypothyroidism   . Iron deficiency anemia   . Thyroid disease    Past Surgical History:  Procedure Laterality Date  . LAPAROSCOPIC VAGINAL HYSTERECTOMY WITH SALPINGECTOMY Bilateral 10/01/2016   Procedure: LAPAROSCOPIC ASSISTED VAGINAL HYSTERECTOMY WITH SALPINGECTOMY;  Surgeon: Paula Compton, MD;  Location: [facility];  Service: Gynecology;  Laterality: Bilateral;  . WISDOM TOOTH EXTRACTION       Current Meds  Medication Sig  . ALPRAZolam (XANAX XR) 1 MG 24 hr tablet Take 1 mg by mouth at bedtime.   . cetirizine (ZYRTEC) 10 MG tablet Take 10 mg by mouth every evening.  Marland Kitchen ibuprofen (ADVIL,MOTRIN) 600 MG tablet Take 1 tablet (600 mg total) by mouth every 6 (six) hours as needed (mild pain).  Marland Kitchen lamoTRIgine (LAMICTAL) 100 MG tablet Take 100 mg by mouth at bedtime.  . [DISCONTINUED] FLUoxetine HCl 60 MG TABS Take 1 tablet by mouth daily.  . [DISCONTINUED] levothyroxine (SYNTHROID, LEVOTHROID) 175 MCG tablet TAKE 1 TABLET BY MOUTH EVERY DAY     Allergies:   Latex and Penicillins   Social History   Tobacco Use  . Smoking status: Never Smoker  . Smokeless tobacco: Never Used  Substance Use Topics  . Alcohol use: No  . Drug use: No     Family Hx: The patient's family history includes Diabetes in her father and another family member; Heart disease in an other  family member; Hypertension in her mother and another family member; Thyroid disease in her mother and another family member.  ROS:   Please see the history of present illness.    Review of Systems  Constitutional: Negative for chills, fatigue and fever.  Respiratory: Negative.  Negative for cough.   Cardiovascular: Negative for chest pain.  Neurological: Negative for dizziness and headaches.  Psychiatric/Behavioral: Positive for depression. Negative for decreased concentration. The patient has insomnia. The patient is not  nervous/anxious.        She is being followed by Dr. Toy Care    All other systems reviewed and are negative.   Labs/Other Tests and Data Reviewed:    Recent Labs: 06/03/2018: TSH 25.010   Recent Lipid Panel Lab Results  Component Value Date/Time   CHOL 264 (H) 06/03/2018 04:35 PM   TRIG 158 (H) 06/03/2018 04:35 PM   HDL 60 06/03/2018 04:35 PM   CHOLHDL 4.4 06/03/2018 04:35 PM   LDLCALC 172 (H) 06/03/2018 04:35 PM    Wt Readings from Last 3 Encounters:  06/03/18 203 lb (92.1 kg)  10/01/16 199 lb (90.3 kg)  09/18/16 199 lb 8 oz (90.5 kg)     Exam:    Vital Signs:  LMP 08/28/2016 (Exact Date) Comment: come back on 09/15/2016    Physical Exam  Constitutional: She is oriented to person, place, and time and well-developed, well-nourished, and in no distress. No distress.  HENT:  Head: Normocephalic and atraumatic.  Pulmonary/Chest: Effort normal.  Neurological: She is alert and oriented to person, place, and time.  Skin: Skin is warm and dry.  Psychiatric: Mood, memory, affect and judgment normal.    ASSESSMENT & PLAN:    1. Depression with anxiety  She is being followed by Dr. Toy Care and will be speaking with her this week due for a medication follow up.  She did not take the fluoxetine and I have given her the number for Dr. Darleene Cleaver for counseling  Depression score today is 0 - escitalopram (LEXAPRO) 10 MG tablet; Take 1 tablet (10 mg total) by mouth daily.  Dispense: 30 tablet; Refill: 1  2. Acquired hypothyroidism  Chronic, poorly controlled  Continue with current medications pending lab results  She did not return for 4 week lab check after last visit  Will recheck thyroid levels today  Discussed abnormal levels can cause mood changes - TSH - T3 - T4, Free  3. Prediabetes  Chronic, fair control  Continue with current medications  Encouraged to limit intake of sugary foods and drinks  Encouraged to increase physical activity to 150 minutes per week  - Hemoglobin A1c  4. Mixed hyperlipidemia  Chronic, elevated at last visit  No current medications - Lipid Profile   COVID-19 Education: The signs and symptoms of COVID-19 were discussed with the patient and how to seek care for testing (follow up with PCP or arrange E-visit).  The importance of social distancing was discussed today.  Patient Risk:   After full review of this patients clinical status, I feel that they are at least low risk at this time.  Time:   Today, I have spent 16 minutes/ seconds with the patient with telehealth technology discussing above diagnoses.     Medication Adjustments/Labs and Tests Ordered: Current medicines are reviewed at length with the patient today.  Concerns regarding medicines are outlined above.   Tests Ordered: Orders Placed This Encounter  Procedures  . Hemoglobin A1c  . Lipid Profile  . T3  .  T4, Free  . TSH    Medication Changes: Meds ordered this encounter  Medications  . escitalopram (LEXAPRO) 10 MG tablet    Sig: Take 1 tablet (10 mg total) by mouth daily.    Dispense:  30 tablet    Refill:  1    Disposition:  Follow up in 3 month(s)  Signed, [name], FNP

## 2018-09-07 ENCOUNTER — Other Ambulatory Visit: Payer: BC Managed Care – PPO

## 2018-09-07 ENCOUNTER — Other Ambulatory Visit: Payer: Self-pay

## 2018-09-07 DIAGNOSIS — R7303 Prediabetes: Secondary | ICD-10-CM

## 2018-09-07 DIAGNOSIS — E782 Mixed hyperlipidemia: Secondary | ICD-10-CM

## 2018-09-07 DIAGNOSIS — E039 Hypothyroidism, unspecified: Secondary | ICD-10-CM

## 2018-09-08 LAB — HEMOGLOBIN A1C
Est. average glucose Bld gHb Est-mCnc: 134 mg/dL
Hgb A1c MFr Bld: 6.3 % — ABNORMAL HIGH (ref 4.8–5.6)

## 2018-09-08 LAB — T4, FREE: Free T4: 1.17 ng/dL (ref 0.82–1.77)

## 2018-09-08 LAB — LIPID PANEL
Chol/HDL Ratio: 4.9 ratio — ABNORMAL HIGH (ref 0.0–4.4)
Cholesterol, Total: 240 mg/dL — ABNORMAL HIGH (ref 100–199)
HDL: 49 mg/dL (ref >39)
LDL Calculated: 156 mg/dL — ABNORMAL HIGH (ref 0–99)
Triglycerides: 173 mg/dL — ABNORMAL HIGH (ref 0–149)
VLDL Cholesterol Cal: 35 mg/dL (ref 5–40)

## 2018-09-08 LAB — T3: T3, Total: 105 ng/dL (ref 71–180)

## 2018-09-08 LAB — TSH: TSH: 6.51 u[IU]/mL — ABNORMAL HIGH (ref 0.450–4.500)

## 2018-09-09 ENCOUNTER — Telehealth: Payer: Self-pay

## 2018-09-09 NOTE — Telephone Encounter (Signed)
Patient called and she stated the call was dropped when she was receiving her lab results I went over them with her and advised her that she needs to cut back on breads,sweets,fried food and cheese and we will recheck it at her lab visit in 6 weeks and if it is not better we will have to put her on medication. Patient stated what kind of what medicine is it even though she will not take it and I told her it would be a cholesterol medicine andi told her if she does not get her cholesterol under control she will have to start the medication because it increases her of having cardiovascular problems. YRL,RMA

## 2018-09-14 ENCOUNTER — Encounter: Payer: BC Managed Care – PPO | Admitting: Nurse Practitioner

## 2018-09-26 ENCOUNTER — Encounter: Payer: Self-pay | Admitting: Nurse Practitioner

## 2018-10-21 ENCOUNTER — Other Ambulatory Visit: Payer: Self-pay

## 2018-10-21 ENCOUNTER — Other Ambulatory Visit: Payer: BC Managed Care – PPO

## 2018-10-21 ENCOUNTER — Other Ambulatory Visit: Payer: Self-pay | Admitting: Nurse Practitioner

## 2018-10-21 DIAGNOSIS — E039 Hypothyroidism, unspecified: Secondary | ICD-10-CM

## 2018-10-22 LAB — T4: T4, Total: 9.1 ug/dL (ref 4.5–12.0)

## 2018-10-22 LAB — T3, FREE: T3, Free: 2.6 pg/mL (ref 2.0–4.4)

## 2018-10-22 LAB — TSH: TSH: 1.9 u[IU]/mL (ref 0.450–4.500)

## 2018-11-22 ENCOUNTER — Encounter: Payer: BC Managed Care – PPO | Admitting: Nurse Practitioner

## 2018-12-07 ENCOUNTER — Other Ambulatory Visit: Payer: Self-pay | Admitting: Nurse Practitioner

## 2018-12-07 DIAGNOSIS — E039 Hypothyroidism, unspecified: Secondary | ICD-10-CM

## 2019-04-11 ENCOUNTER — Other Ambulatory Visit: Payer: Self-pay | Admitting: Nurse Practitioner

## 2019-04-11 DIAGNOSIS — E039 Hypothyroidism, unspecified: Secondary | ICD-10-CM

## 2019-04-27 ENCOUNTER — Telehealth: Payer: Self-pay

## 2019-04-27 NOTE — Telephone Encounter (Signed)
Called pt to see if she had been taking her thyroid medications per provider. Pt stated yes she has been, she uses [facility] not [facility]

## 2019-05-04 ENCOUNTER — Ambulatory Visit: Payer: BC Managed Care – PPO | Admitting: Nurse Practitioner

## 2019-05-06 ENCOUNTER — Other Ambulatory Visit: Payer: Self-pay | Admitting: Nurse Practitioner

## 2019-05-06 DIAGNOSIS — E039 Hypothyroidism, unspecified: Secondary | ICD-10-CM

## 2019-05-08 ENCOUNTER — Other Ambulatory Visit: Payer: Self-pay | Admitting: Nurse Practitioner

## 2019-05-08 DIAGNOSIS — E039 Hypothyroidism, unspecified: Secondary | ICD-10-CM

## 2019-05-31 ENCOUNTER — Other Ambulatory Visit: Payer: Self-pay

## 2019-05-31 ENCOUNTER — Ambulatory Visit (INDEPENDENT_AMBULATORY_CARE_PROVIDER_SITE_OTHER): Payer: BC Managed Care – PPO | Admitting: Nurse Practitioner

## 2019-05-31 ENCOUNTER — Encounter: Payer: Self-pay | Admitting: Nurse Practitioner

## 2019-05-31 VITALS — BP 128/84 | HR 72 | Temp 97.9°F | Ht 66.0 in | Wt 181.2 lb

## 2019-05-31 DIAGNOSIS — F418 Other specified anxiety disorders: Secondary | ICD-10-CM

## 2019-05-31 DIAGNOSIS — Z0000 Encounter for general adult medical examination without abnormal findings: Secondary | ICD-10-CM | POA: Diagnosis not present

## 2019-05-31 DIAGNOSIS — Z113 Encounter for screening for infections with a predominantly sexual mode of transmission: Secondary | ICD-10-CM

## 2019-05-31 DIAGNOSIS — R413 Other amnesia: Secondary | ICD-10-CM

## 2019-05-31 DIAGNOSIS — Z23 Encounter for immunization: Secondary | ICD-10-CM | POA: Diagnosis not present

## 2019-05-31 DIAGNOSIS — Z1211 Encounter for screening for malignant neoplasm of colon: Secondary | ICD-10-CM

## 2019-05-31 DIAGNOSIS — R7303 Prediabetes: Secondary | ICD-10-CM

## 2019-05-31 DIAGNOSIS — E039 Hypothyroidism, unspecified: Secondary | ICD-10-CM

## 2019-05-31 DIAGNOSIS — E782 Mixed hyperlipidemia: Secondary | ICD-10-CM

## 2019-05-31 LAB — POCT URINALYSIS DIPSTICK
Bilirubin, UA: NEGATIVE
Blood, UA: NEGATIVE
Glucose, UA: NEGATIVE
Ketones, UA: NEGATIVE
Leukocytes, UA: NEGATIVE
Nitrite, UA: NEGATIVE
Protein, UA: NEGATIVE
Spec Grav, UA: 1.02 (ref 1.010–1.025)
Urobilinogen, UA: 0.2 E.U./dL
pH, UA: 6 (ref 5.0–8.0)

## 2019-05-31 MED ORDER — TETANUS-DIPHTH-ACELL PERTUSSIS 5-2.5-18.5 LF-MCG/0.5 IM SUSP
0.5000 mL | Freq: Once | INTRAMUSCULAR | Status: AC
  Administered 2019-05-31: 0.5 mL via INTRAMUSCULAR

## 2019-05-31 NOTE — Progress Notes (Signed)
This visit occurred during the SARS-CoV-2 public health emergency.  Safety protocols were in place, including screening questions prior to the visit, additional usage of staff PPE, and extensive cleaning of exam room while observing appropriate contact time as indicated for disinfecting solutions.  Subjective:     Patient ID: Amanda Henry , female    DOB: [birth year] , 54 y.o.   MRN: [id number]   Chief Complaint  Patient presents with  . Annual Exam    HPI  Here for HM  Wt Readings from Last 3 Encounters: 05/31/19 : 181 lb 3.2 oz (82.2 kg) 06/03/18 : 203 lb (92.1 kg) 10/01/16 : 199 lb (90.3 kg)     The patient states she uses status post hysterectomy for birth control.  Mammogram last done 2018 - she is going to see Dr. Marvel Plan in the next few months.   Negative for: breast discharge, breast lump(s), breast pain and breast self exam.  Pertinent negatives include abnormal bleeding (hematology), anxiety, decreased libido, depression, difficulty falling sleep, dyspareunia, history of infertility, nocturia, sexual dysfunction, sleep disturbances, urinary incontinence, urinary urgency, vaginal discharge and vaginal itching. Diet regular - has declined since the Holidays - eating out of the vending machine.  The patient states her exercise level is moderate earlier last year, none lately.      The patient's tobacco use is:  Social History   Tobacco Use  Smoking Status Never Smoker  Smokeless Tobacco Never Used  . She has been exposed to passive smoke. The patient's alcohol use is:  Social History   Substance and Sexual Activity  Alcohol Use No   Additional information: Last pap 2018, next one scheduled for 2021.  Past Medical History:  Diagnosis Date  . Anxiety   . Blood transfusion without reported diagnosis   . Depression   . Hypertension    no medications at this time  . Hypothyroidism   . Iron deficiency anemia   . Thyroid disease      Family History  Problem  Relation Age of Onset  . Thyroid disease Mother   . Hypertension Mother   . Diabetes Father   . Hypertension Other   . Diabetes Other   . Thyroid disease Other   . Heart disease Other      Current Outpatient Medications:  .  ALPRAZolam (XANAX XR) 1 MG 24 hr tablet, Take 1 mg by mouth at bedtime. , Disp: , Rfl:  .  cetirizine (ZYRTEC) 10 MG tablet, Take 10 mg by mouth every evening., Disp: , Rfl:  .  escitalopram (LEXAPRO) 10 MG tablet, Take 1 tablet (10 mg total) by mouth daily., Disp: 30 tablet, Rfl: 1 .  ibuprofen (ADVIL,MOTRIN) 600 MG tablet, Take 1 tablet (600 mg total) by mouth every 6 (six) hours as needed (mild pain)., Disp: 30 tablet, Rfl: 0 .  lamoTRIgine (LAMICTAL) 100 MG tablet, Take 100 mg by mouth at bedtime., Disp: , Rfl:  .  levothyroxine (SYNTHROID) 175 MCG tablet, TAKE 1 TABLET(175 MCG) BY MOUTH DAILY, Disp: 90 tablet, Rfl: 0   Allergies  Allergen Reactions  . Latex Rash  . Penicillins Rash    Has patient had a PCN reaction causing immediate rash, facial/tongue/throat swelling, SOB or lightheadedness with hypotension: Yes Has patient had a PCN reaction causing severe rash involving mucus membranes or skin necrosis: No Has patient had a PCN reaction that required hospitalization No Has patient had a PCN reaction occurring within the last 10 years: No If all of the above  answers are "NO", then may proceed with Cephalosporin use.      Review of Systems  Constitutional: Negative.   HENT: Negative.   Eyes: Negative.   Respiratory: Negative.   Cardiovascular: Negative.   Gastrointestinal: Negative.   Endocrine: Negative.   Genitourinary: Negative.   Musculoskeletal: Negative.   Skin: Negative.   Allergic/Immunologic: Negative.   Neurological: Negative.   Hematological: Negative.   Psychiatric/Behavioral: Negative.      There were no vitals filed for this visit. There is no height or weight on file to calculate BMI.   Objective:  Physical  Exam Constitutional:      Appearance: Normal appearance. She is well-developed.  HENT:     Head: Normocephalic and atraumatic.     Right Ear: Hearing, tympanic membrane, ear canal and external ear normal.     Left Ear: Hearing, tympanic membrane, ear canal and external ear normal.     Nose: Nose normal.     Mouth/Throat:     Mouth: Mucous membranes are moist.  Eyes:     General: Lids are normal.     Conjunctiva/sclera: Conjunctivae normal.     Pupils: Pupils are equal, round, and reactive to light.     Funduscopic exam:    Right eye: No papilledema.        Left eye: No papilledema.  Neck:     Thyroid: No thyroid mass.     Vascular: No carotid bruit.  Cardiovascular:     Rate and Rhythm: Normal rate and regular rhythm.     Pulses: Normal pulses.     Heart sounds: Normal heart sounds. No murmur.  Pulmonary:     Effort: Pulmonary effort is normal. No respiratory distress.     Breath sounds: Normal breath sounds.  Abdominal:     General: Abdomen is flat. Bowel sounds are normal.     Palpations: Abdomen is soft.  Musculoskeletal:        General: No swelling. Normal range of motion.     Cervical back: Full passive range of motion without pain, normal range of motion and neck supple.     Right lower leg: No edema.     Left lower leg: No edema.  Skin:    General: Skin is warm and dry.     Capillary Refill: Capillary refill takes less than 2 seconds.  Neurological:     General: No focal deficit present.     Mental Status: She is alert and oriented to person, place, and time.     Cranial Nerves: No cranial nerve deficit.     Sensory: No sensory deficit.  Psychiatric:        Mood and Affect: Mood normal.        Behavior: Behavior normal.        Thought Content: Thought content normal.        Judgment: Judgment normal.         Assessment And Plan:     1. Encounter for general adult medical examination w/o abnormal findings . Behavior modifications discussed and diet history  reviewed.   . Pt will continue to exercise regularly and modify diet with low GI, plant based foods and decrease intake of processed foods.  . Recommend intake of daily multivitamin, Vitamin D, and calcium.  . Recommend mammogram and colonoscopy for preventive screenings, as well as recommend immunizations that include influenza, TDAP, and Shingles - POCT Urinalysis Dipstick (81002) - TSH - CBC without diff - Vitamin D (25 hydroxy)  2. Acquired hypothyroidism  Chronic, last visit had elevations in thyroid levels - CMP14+EGFR - T3, free - T4 - TSH  3. Depression with anxiety   4. Prediabetes  Chronic, controlled  No current medications  Encouraged to limit intake of sugary foods and drinks  Encouraged to increase physical activity to 150 minutes per week - Hemoglobin A1c  5. Mixed hyperlipidemia  Chronic, controlled  No current medications - Lipid Profile  6. Screening examination for STD (sexually transmitted disease) No known exposure - HIV antibody (with reflex)  7. Encounter for immunization  Will give tetanus vaccine today while in office. Refer to order management. TDAP will be administered to adults 54-87 years old every 10 years. - Tdap (BOOSTRIX) injection 0.5 mL  8. Encounter for screening colonoscopy  According to USPTF Colorectal cancer Screening guidelines. Colonoscopy is recommended every 10 years, starting at age 19years.  Will refer to GI for colon cancer screening. - Ambulatory referral to Gastroenterology  9. Memory difficulties  Will check thyroid and vitamin B12 levels - Vitamin B12      [name], FNP    THE PATIENT IS ENCOURAGED TO PRACTICE SOCIAL DISTANCING DUE TO THE COVID-19 PANDEMIC.

## 2019-05-31 NOTE — Patient Instructions (Signed)
Health Maintenance  Topic Date Due  . HIV Screening  07/16/1982  . TETANUS/TDAP  07/16/1986  . PAP SMEAR-Modifier  07/16/1988  . MAMMOGRAM  07/16/2017  . COLONOSCOPY  07/16/2017  . INFLUENZA VACCINE  Completed   Health Maintenance, Female Adopting a healthy lifestyle and getting preventive care are important in promoting health and wellness. Ask your health care provider about:  The right schedule for you to have regular tests and exams.  Things you can do on your own to prevent diseases and keep yourself healthy. What should I know about diet, weight, and exercise? Eat a healthy diet   Eat a diet that includes plenty of vegetables, fruits, low-fat dairy products, and lean protein.  Do not eat a lot of foods that are high in solid fats, added sugars, or sodium. Maintain a healthy weight Body mass index (BMI) is used to identify weight problems. It estimates body fat based on height and weight. Your health care provider can help determine your BMI and help you achieve or maintain a healthy weight. Get regular exercise Get regular exercise. This is one of the most important things you can do for your health. Most adults should:  Exercise for at least 150 minutes each week. The exercise should increase your heart rate and make you sweat (moderate-intensity exercise).  Do strengthening exercises at least twice a week. This is in addition to the moderate-intensity exercise.  Spend less time sitting. Even light physical activity can be beneficial. Watch cholesterol and blood lipids Have your blood tested for lipids and cholesterol at 52 years of age, then have this test every 5 years. Have your cholesterol levels checked more often if:  Your lipid or cholesterol levels are high.  You are older than 52 years of age.  You are at high risk for heart disease. What should I know about cancer screening? Depending on your health history and family history, you may need to have cancer  screening at various ages. This may include screening for:  Breast cancer.  Cervical cancer.  Colorectal cancer.  Skin cancer.  Lung cancer. What should I know about heart disease, diabetes, and high blood pressure? Blood pressure and heart disease  High blood pressure causes heart disease and increases the risk of stroke. This is more likely to develop in people who have high blood pressure readings, are of African descent, or are overweight.  Have your blood pressure checked: ? Every 3-5 years if you are 21-81 years of age. ? Every year if you are 15 years old or older. Diabetes Have regular diabetes screenings. This checks your fasting blood sugar level. Have the screening done:  Once every three years after age 9 if you are at a normal weight and have a low risk for diabetes.  More often and at a younger age if you are overweight or have a high risk for diabetes. What should I know about preventing infection? Hepatitis B If you have a higher risk for hepatitis B, you should be screened for this virus. Talk with your health care provider to find out if you are at risk for hepatitis B infection. Hepatitis C Testing is recommended for:  Everyone born from [birth year] through 1963.  Anyone with known risk factors for hepatitis C. Sexually transmitted infections (STIs)  Get screened for STIs, including gonorrhea and chlamydia, if: ? You are sexually active and are younger than 52 years of age. ? You are older than 52 years of age and your health  care provider tells you that you are at risk for this type of infection. ? Your sexual activity has changed since you were last screened, and you are at increased risk for chlamydia or gonorrhea. Ask your health care provider if you are at risk.  Ask your health care provider about whether you are at high risk for HIV. Your health care provider may recommend a prescription medicine to help prevent HIV infection. If you choose to take  medicine to prevent HIV, you should first get tested for HIV. You should then be tested every 3 months for as long as you are taking the medicine. Pregnancy  If you are about to stop having your period (premenopausal) and you may become pregnant, seek counseling before you get pregnant.  Take 400 to 800 micrograms (mcg) of folic acid every day if you become pregnant.  Ask for birth control (contraception) if you want to prevent pregnancy. Osteoporosis and menopause Osteoporosis is a disease in which the bones lose minerals and strength with aging. This can result in bone fractures. If you are 42 years old or older, or if you are at risk for osteoporosis and fractures, ask your health care provider if you should:  Be screened for bone loss.  Take a calcium or vitamin D supplement to lower your risk of fractures.  Be given hormone replacement therapy (HRT) to treat symptoms of menopause. Follow these instructions at home: Lifestyle  Do not use any products that contain nicotine or tobacco, such as cigarettes, e-cigarettes, and chewing tobacco. If you need help quitting, ask your health care provider.  Do not use street drugs.  Do not share needles.  Ask your health care provider for help if you need support or information about quitting drugs. Alcohol use  Do not drink alcohol if: ? Your health care provider tells you not to drink. ? You are pregnant, may be pregnant, or are planning to become pregnant.  If you drink alcohol: ? Limit how much you use to 0-1 drink a day. ? Limit intake if you are breastfeeding.  Be aware of how much alcohol is in your drink. In the [place] one drink equals one 12 oz bottle of beer (355 mL), one 5 oz glass of wine (148 mL), or one 1 oz glass of hard liquor (44 mL). General instructions  Schedule regular health, dental, and eye exams.  Stay current with your vaccines.  Tell your health care provider if: ? You often feel depressed. ? You have  ever been abused or do not feel safe at home. Summary  Adopting a healthy lifestyle and getting preventive care are important in promoting health and wellness.  Follow your health care provider's instructions about healthy diet, exercising, and getting tested or screened for diseases.  Follow your health care provider's instructions on monitoring your cholesterol and blood pressure. This information is not intended to replace advice given to you by your health care provider. Make sure you discuss any questions you have with your health care provider. Document Revised: 04/28/2018 Document Reviewed: 04/28/2018 Elsevier Patient Education  [employer].   https://www.cdc.gov/vaccines/hcp/vis/vis-statements/tdap.pdf">  Tdap (Tetanus, Diphtheria, Pertussis) Vaccine: What You Need to Know 1. Why get vaccinated? Tdap vaccine can prevent tetanus, diphtheria, and pertussis. Diphtheria and pertussis spread from person to person. Tetanus enters the body through cuts or wounds.  TETANUS (T) causes painful stiffening of the muscles. Tetanus can lead to serious health problems, including being unable to open the mouth, having trouble swallowing  and breathing, or death.  DIPHTHERIA (D) can lead to difficulty breathing, heart failure, paralysis, or death.  PERTUSSIS (aP), also known as "whooping cough," can cause uncontrollable, violent coughing which makes it hard to breathe, eat, or drink. Pertussis can be extremely serious in babies and young children, causing pneumonia, convulsions, brain damage, or death. In teens and adults, it can cause weight loss, loss of bladder control, passing out, and rib fractures from severe coughing. 2. Tdap vaccine Tdap is only for children 7 years and older, adolescents, and adults.  Adolescents should receive a single dose of Tdap, preferably at age 73 or 7 years. Pregnant women should get a dose of Tdap during every pregnancy, to protect the newborn from  pertussis. Infants are most at risk for severe, life-threatening complications from pertussis. Adults who have never received Tdap should get a dose of Tdap. Also, adults should receive a booster dose every 10 years, or earlier in the case of a severe and dirty wound or burn. Booster doses can be either Tdap or Td (a different vaccine that protects against tetanus and diphtheria but not pertussis). Tdap may be given at the same time as other vaccines. 3. Talk with your health care provider Tell your vaccine provider if the person getting the vaccine:  Has had an allergic reaction after a previous dose of any vaccine that protects against tetanus, diphtheria, or pertussis, or has any severe, life-threatening allergies.  Has had a coma, decreased level of consciousness, or prolonged seizures within 7 days after a previous dose of any pertussis vaccine (DTP, DTaP, or Tdap).  Has seizures or another nervous system problem.  Has ever had Guillain-Barr Syndrome (also called GBS).  Has had severe pain or swelling after a previous dose of any vaccine that protects against tetanus or diphtheria. In some cases, your health care provider may decide to postpone Tdap vaccination to a future visit.  People with minor illnesses, such as a cold, may be vaccinated. People who are moderately or severely ill should usually wait until they recover before getting Tdap vaccine.  Your health care provider can give you more information. 4. Risks of a vaccine reaction  Pain, redness, or swelling where the shot was given, mild fever, headache, feeling tired, and nausea, vomiting, diarrhea, or stomachache sometimes happen after Tdap vaccine. People sometimes faint after medical procedures, including vaccination. Tell your provider if you feel dizzy or have vision changes or ringing in the ears.  As with any medicine, there is a very remote chance of a vaccine causing a severe allergic reaction, other serious injury, or  death. 5. What if there is a serious problem? An allergic reaction could occur after the vaccinated person leaves the clinic. If you see signs of a severe allergic reaction (hives, swelling of the face and throat, difficulty breathing, a fast heartbeat, dizziness, or weakness), call 9-1-1 and get the person to the nearest hospital. For other signs that concern you, call your health care provider.  Adverse reactions should be reported to the Vaccine Adverse Event Reporting System (VAERS). Your health care provider will usually file this report, or you can do it yourself. Visit the VAERS website at www.vaers.SamedayNews.es or call (336)661-3697. VAERS is only for reporting reactions, and VAERS staff do not give medical advice. 6. The National Vaccine Injury Compensation Program The [employer] Vaccine Injury Compensation Program (VICP) is a federal program that was created to compensate people who may have been injured by certain vaccines. Visit the [facility] website  at GoldCloset.com.ee or call 8283712031 to learn about the program and about filing a claim. There is a time limit to file a claim for compensation. 7. How can I learn more?  Ask your health care provider.  Call your local or state health department.  Contact the Centers for Disease Control and Prevention (CDC): ? Call (518) 328-7840 (1-800-CDC-INFO) or ? Visit CDC's website at http://hunter.com/ Vaccine Information Statement Tdap (Tetanus, Diphtheria, Pertussis) Vaccine (08/18/2018) This information is not intended to replace advice given to you by your health care provider. Make sure you discuss any questions you have with your health care provider. Document Revised: 08/27/2018 Document Reviewed: 08/30/2018 Elsevier Patient Education  [place].

## 2019-06-01 ENCOUNTER — Telehealth: Payer: Self-pay

## 2019-06-01 LAB — LIPID PANEL
Chol/HDL Ratio: 4.3 ratio (ref 0.0–4.4)
Cholesterol, Total: 240 mg/dL — ABNORMAL HIGH (ref 100–199)
HDL: 56 mg/dL (ref >39)
LDL Chol Calc (NIH): 155 mg/dL — ABNORMAL HIGH (ref 0–99)
Triglycerides: 163 mg/dL — ABNORMAL HIGH (ref 0–149)
VLDL Cholesterol Cal: 29 mg/dL (ref 5–40)

## 2019-06-01 LAB — CMP14+EGFR
ALT: 13 IU/L (ref 0–32)
AST: 20 IU/L (ref 0–40)
Albumin/Globulin Ratio: 1.2 (ref 1.2–2.2)
Albumin: 4.1 g/dL (ref 3.8–4.9)
Alkaline Phosphatase: 186 IU/L — ABNORMAL HIGH (ref 39–117)
BUN/Creatinine Ratio: 12 (ref 9–23)
BUN: 9 mg/dL (ref 6–24)
Bilirubin Total: 0.4 mg/dL (ref 0.0–1.2)
CO2: 26 mmol/L (ref 20–29)
Calcium: 14.5 mg/dL (ref 8.7–10.2)
Chloride: 101 mmol/L (ref 96–106)
Creatinine, Ser: 0.77 mg/dL (ref 0.57–1.00)
GFR calc Af Amer: 103 mL/min/{1.73_m2} (ref >59)
GFR calc non Af Amer: 90 mL/min/{1.73_m2} (ref >59)
Globulin, Total: 3.3 g/dL (ref 1.5–4.5)
Glucose: 82 mg/dL (ref 65–99)
Potassium: 3.9 mmol/L (ref 3.5–5.2)
Sodium: 137 mmol/L (ref 134–144)
Total Protein: 7.4 g/dL (ref 6.0–8.5)

## 2019-06-01 LAB — CBC
Hematocrit: 41 % (ref 34.0–46.6)
Hemoglobin: 13.7 g/dL (ref 11.1–15.9)
MCH: 29.3 pg (ref 26.6–33.0)
MCHC: 33.4 g/dL (ref 31.5–35.7)
MCV: 88 fL (ref 79–97)
Platelets: 307 10*3/uL (ref 150–450)
RBC: 4.67 x10E6/uL (ref 3.77–5.28)
RDW: 12.9 % (ref 11.7–15.4)
WBC: 7.2 10*3/uL (ref 3.4–10.8)

## 2019-06-01 LAB — TSH: TSH: 0.622 u[IU]/mL (ref 0.450–4.500)

## 2019-06-01 LAB — T3, FREE: T3, Free: 2.7 pg/mL (ref 2.0–4.4)

## 2019-06-01 LAB — HEMOGLOBIN A1C
Est. average glucose Bld gHb Est-mCnc: 123 mg/dL
Hgb A1c MFr Bld: 5.9 % — ABNORMAL HIGH (ref 4.8–5.6)

## 2019-06-01 LAB — HIV ANTIBODY (ROUTINE TESTING W REFLEX): HIV Screen 4th Generation wRfx: NONREACTIVE

## 2019-06-01 LAB — VITAMIN B12: Vitamin B-12: 417 pg/mL (ref 232–1245)

## 2019-06-01 LAB — VITAMIN D 25 HYDROXY (VIT D DEFICIENCY, FRACTURES): Vit D, 25-Hydroxy: 12.4 ng/mL — ABNORMAL LOW (ref 30.0–100.0)

## 2019-06-01 LAB — T4: T4, Total: 8.6 ug/dL (ref 4.5–12.0)

## 2019-06-01 NOTE — Telephone Encounter (Signed)
I called patient and was unable to leave her a v/m we need her to come back in for additional labs. YRL,RMA

## 2019-06-02 ENCOUNTER — Telehealth: Payer: Self-pay

## 2019-06-02 NOTE — Telephone Encounter (Signed)
Attempted to call pt twice we need her to come in for additional bloodwork due to her calcium being elevated. YRL,RMA

## 2019-06-06 ENCOUNTER — Other Ambulatory Visit: Payer: Self-pay

## 2019-06-09 MED ORDER — VITAMIN D (ERGOCALCIFEROL) 1.25 MG (50000 UNIT) PO CAPS: 50000.0000 [IU] | ORAL_CAPSULE | ORAL | 1 refills | Status: DC

## 2019-06-14 ENCOUNTER — Other Ambulatory Visit: Payer: BC Managed Care – PPO

## 2019-06-14 ENCOUNTER — Other Ambulatory Visit: Payer: Self-pay | Admitting: Nurse Practitioner

## 2019-06-14 ENCOUNTER — Other Ambulatory Visit: Payer: Self-pay

## 2019-06-16 LAB — PROTEIN ELECTROPHORESIS, SERUM, WITH REFLEX
A/G Ratio: 1.1 (ref 0.7–1.7)
Albumin ELP: 3.9 g/dL (ref 2.9–4.4)
Alpha 1: 0.2 g/dL (ref 0.0–0.4)
Alpha 2: 0.7 g/dL (ref 0.4–1.0)
Beta: 1.1 g/dL (ref 0.7–1.3)
Gamma Globulin: 1.5 g/dL (ref 0.4–1.8)
Globulin, Total: 3.4 g/dL (ref 2.2–3.9)
Total Protein: 7.3 g/dL (ref 6.0–8.5)

## 2019-06-16 LAB — PARATHYROID HORMONE, INTACT (NO CA): PTH: 163 pg/mL — ABNORMAL HIGH (ref 15–65)

## 2019-06-23 ENCOUNTER — Other Ambulatory Visit: Payer: Self-pay | Admitting: Nurse Practitioner

## 2019-06-23 DIAGNOSIS — E213 Hyperparathyroidism, unspecified: Secondary | ICD-10-CM

## 2019-06-23 NOTE — Progress Notes (Signed)
New order.

## 2019-06-24 LAB — PROTEIN ELECTROPHORESIS
A/G Ratio: 1.2 (ref 0.7–1.7)
Albumin ELP: 4 g/dL (ref 2.9–4.4)
Alpha 1: 0.2 g/dL (ref 0.0–0.4)
Alpha 2: 0.6 g/dL (ref 0.4–1.0)
Beta: 1 g/dL (ref 0.7–1.3)
Gamma Globulin: 1.5 g/dL (ref 0.4–1.8)
Globulin, Total: 3.3 g/dL (ref 2.2–3.9)
Total Protein: 7.3 g/dL (ref 6.0–8.5)

## 2019-06-24 LAB — SPECIMEN STATUS REPORT

## 2019-07-13 ENCOUNTER — Telehealth: Payer: Self-pay

## 2019-07-13 NOTE — Telephone Encounter (Signed)
Attempt to give lab results  

## 2019-07-13 NOTE — Telephone Encounter (Signed)
-----   Message from [name], [place] sent at 07/07/2019  3:15 PM EST ----- Your follow up labs are normal.

## 2019-08-04 ENCOUNTER — Other Ambulatory Visit: Payer: Self-pay | Admitting: Nurse Practitioner

## 2019-08-04 DIAGNOSIS — E039 Hypothyroidism, unspecified: Secondary | ICD-10-CM

## 2019-08-05 LAB — HM COLONOSCOPY

## 2019-08-08 ENCOUNTER — Encounter: Payer: Self-pay | Admitting: Nurse Practitioner

## 2019-08-29 ENCOUNTER — Ambulatory Visit: Payer: BC Managed Care – PPO | Admitting: Nurse Practitioner

## 2019-08-29 ENCOUNTER — Encounter: Payer: Self-pay | Admitting: Nurse Practitioner

## 2019-08-29 ENCOUNTER — Other Ambulatory Visit: Payer: Self-pay | Admitting: Nurse Practitioner

## 2019-08-29 ENCOUNTER — Other Ambulatory Visit: Payer: Self-pay

## 2019-08-29 VITALS — BP 118/80 | HR 61 | Temp 98.2°F | Ht 67.2 in | Wt 175.2 lb

## 2019-08-29 DIAGNOSIS — E039 Hypothyroidism, unspecified: Secondary | ICD-10-CM | POA: Diagnosis not present

## 2019-08-29 DIAGNOSIS — R7303 Prediabetes: Secondary | ICD-10-CM

## 2019-08-29 DIAGNOSIS — E213 Hyperparathyroidism, unspecified: Secondary | ICD-10-CM

## 2019-08-29 DIAGNOSIS — F418 Other specified anxiety disorders: Secondary | ICD-10-CM | POA: Diagnosis not present

## 2019-08-29 NOTE — Progress Notes (Signed)
This visit occurred during the SARS-CoV-2 public health emergency.  Safety protocols were in place, including screening questions prior to the visit, additional usage of staff PPE, and extensive cleaning of exam room while observing appropriate contact time as indicated for disinfecting solutions.  Subjective:     Patient ID: Amanda Henry , female    DOB: 1965-07-07 , 54 y.o.   MRN: [id number]   Chief Complaint  Patient presents with  . Prediabetes    HPI  Wt Readings from Last 3 Encounters: 08/29/19 : 175 lb 3.2 oz (79.5 kg) 05/31/19 : 181 lb 3.2 oz (82.2 kg) 06/03/18 : 203 lb (92.1 kg)  She has had the covid vaccine and did well.  Thyroid Problem Presents for follow-up visit. Symptoms include weight loss. Patient reports no nail problem or palpitations. The symptoms have been stable.     Past Medical History:  Diagnosis Date  . Anxiety   . Blood transfusion without reported diagnosis   . Depression   . Hypertension    no medications at this time  . Hypothyroidism   . Iron deficiency anemia   . Thyroid disease      Family History  Problem Relation Age of Onset  . Thyroid disease Mother   . Hypertension Mother   . Diabetes Father   . Hypertension Other   . Diabetes Other   . Thyroid disease Other   . Heart disease Other      Current Outpatient Medications:  .  ALPRAZolam (XANAX XR) 1 MG 24 hr tablet, Take 1 mg by mouth at bedtime. , Disp: , Rfl:  .  cetirizine (ZYRTEC) 10 MG tablet, Take 10 mg by mouth every evening., Disp: , Rfl:  .  escitalopram (LEXAPRO) 10 MG tablet, Take 1 tablet (10 mg total) by mouth daily., Disp: 30 tablet, Rfl: 1 .  ibuprofen (ADVIL,MOTRIN) 600 MG tablet, Take 1 tablet (600 mg total) by mouth every 6 (six) hours as needed (mild pain)., Disp: 30 tablet, Rfl: 0 .  levothyroxine (SYNTHROID) 175 MCG tablet, TAKE 1 TABLET(175 MCG) BY MOUTH DAILY, Disp: 90 tablet, Rfl: 0 .  Vitamin D, Ergocalciferol, (DRISDOL) 1.25 MG (50000 UNIT) CAPS  capsule, Take 1 capsule (50,000 Units total) by mouth 2 (two) times a week., Disp: 24 capsule, Rfl: 1 .  lamoTRIgine (LAMICTAL) 100 MG tablet, Take 100 mg by mouth at bedtime., Disp: , Rfl:    Allergies  Allergen Reactions  . Latex Rash  . Penicillins Rash    Has patient had a PCN reaction causing immediate rash, facial/tongue/throat swelling, SOB or lightheadedness with hypotension: Yes Has patient had a PCN reaction causing severe rash involving mucus membranes or skin necrosis: No Has patient had a PCN reaction that required hospitalization No Has patient had a PCN reaction occurring within the last 10 years: No If all of the above answers are "NO", then may proceed with Cephalosporin use.      Review of Systems  Constitutional: Positive for weight loss.  Respiratory: Negative.   Cardiovascular: Negative.  Negative for chest pain, palpitations and leg swelling.  Endocrine: Negative for polydipsia, polyphagia and polyuria.  Neurological: Negative for dizziness and headaches.  Psychiatric/Behavioral: Negative.      Today's Vitals   08/29/19 1558  BP: 118/80  Pulse: 61  Temp: 98.2 F (36.8 C)  TempSrc: Oral  Weight: 175 lb 3.2 oz (79.5 kg)  Height: 5' 7.2" (1.707 m)  PainSc: 0-No pain   Body mass index is 27.28 kg/m.  Objective:  Physical Exam Vitals reviewed.  Constitutional:      General: She is not in acute distress.    Appearance: Normal appearance.  Cardiovascular:     Rate and Rhythm: Normal rate and regular rhythm.     Pulses: Normal pulses.     Heart sounds: No murmur.  Pulmonary:     Effort: Pulmonary effort is normal.     Breath sounds: Normal breath sounds.  Abdominal:     General: Abdomen is flat. Bowel sounds are normal. There is no distension.     Palpations: Abdomen is soft.     Tenderness: There is no abdominal tenderness.  Skin:    General: Skin is warm and dry.     Capillary Refill: Capillary refill takes less than 2 seconds.  Neurological:      General: No focal deficit present.     Mental Status: She is alert and oriented to person, place, and time.     Cranial Nerves: No cranial nerve deficit.  Psychiatric:        Mood and Affect: Mood normal.        Behavior: Behavior normal.        Thought Content: Thought content normal.        Judgment: Judgment normal.         Assessment And Plan:     1. Prediabetes  Chronic, stable  Continue to avoid sugary foods and drinks  2. Acquired hypothyroidism  Chronic, stable  3. Hyperparathyroidism ([place])  I have advised her to go for her MRI  I will also refer her to endocrinology for further evaluation as she is losing weight as well   4. Depression with anxiety  She is being followed by psychiatry - Dr. Toy Care  She is not taking lamictal due to causing drowsiness.      [name], FNP    THE PATIENT IS ENCOURAGED TO PRACTICE SOCIAL DISTANCING DUE TO THE COVID-19 PANDEMIC.

## 2019-08-29 NOTE — Patient Instructions (Signed)
   Red yeast rice supplement for elevated cholesterol  Increase your intake of fiber (benefiber or similar supplement)  Cut back on cheese intake.

## 2019-11-17 ENCOUNTER — Other Ambulatory Visit: Payer: Self-pay | Admitting: Nurse Practitioner

## 2019-11-17 DIAGNOSIS — E039 Hypothyroidism, unspecified: Secondary | ICD-10-CM

## 2019-11-28 ENCOUNTER — Encounter: Payer: Self-pay | Admitting: Nurse Practitioner

## 2019-11-28 ENCOUNTER — Ambulatory Visit: Payer: BC Managed Care – PPO | Admitting: Nurse Practitioner

## 2019-11-28 ENCOUNTER — Other Ambulatory Visit: Payer: Self-pay

## 2019-11-28 VITALS — BP 138/76 | HR 68 | Temp 97.7°F | Ht 67.6 in | Wt 173.2 lb

## 2019-11-28 DIAGNOSIS — R7303 Prediabetes: Secondary | ICD-10-CM | POA: Diagnosis not present

## 2019-11-28 DIAGNOSIS — E039 Hypothyroidism, unspecified: Secondary | ICD-10-CM

## 2019-11-28 DIAGNOSIS — E213 Hyperparathyroidism, unspecified: Secondary | ICD-10-CM | POA: Diagnosis not present

## 2019-11-28 DIAGNOSIS — E782 Mixed hyperlipidemia: Secondary | ICD-10-CM | POA: Diagnosis not present

## 2019-11-28 DIAGNOSIS — Z1159 Encounter for screening for other viral diseases: Secondary | ICD-10-CM

## 2019-11-28 DIAGNOSIS — M25562 Pain in left knee: Secondary | ICD-10-CM

## 2019-11-28 DIAGNOSIS — E559 Vitamin D deficiency, unspecified: Secondary | ICD-10-CM

## 2019-11-28 MED ORDER — DICLOFENAC SODIUM 1 % EX GEL: 2.0000 g | Freq: Four times a day (QID) | CUTANEOUS | 2 refills | Status: DC

## 2019-11-28 MED ORDER — VITAMIN D (ERGOCALCIFEROL) 1.25 MG (50000 UNIT) PO CAPS: 50000.0000 [IU] | ORAL_CAPSULE | ORAL | 1 refills | Status: DC

## 2019-11-28 NOTE — Progress Notes (Signed)
This visit occurred during the SARS-CoV-2 public health emergency.  Safety protocols were in place, including screening questions prior to the visit, additional usage of staff PPE, and extensive cleaning of exam room while observing appropriate contact time as indicated for disinfecting solutions.  Subjective:     Patient ID: Amanda Henry , female    DOB: 12-May-1966 , 54 y.o.   MRN: [id number]   Chief Complaint  Patient presents with  . Hypothyroidism  . Hyperlipidemia    HPI   Presents today for follow up for cholesterol and pre diabetes. She is doing well and reports no hair  and nail issues. She is doing well and reports using herbal life. She is exercises and walking about three days a week 30 minutes. She need to get in the sun and was encouraged to spend more time outside. She does reports some anxiety and increased issues with sleeping. She is going to speak with her counselor.   Wt Readings from Last 3 Encounters: 11/28/19 : 173 lb 3.2 oz (78.6 kg) 08/29/19 : 175 lb 3.2 oz (79.5 kg) 05/31/19 : 181 lb 3.2 oz (82.2 kg)  Thyroid Problem Presents for follow-up visit. Symptoms include anxiety and weight loss. Patient reports no fatigue, nail problem or palpitations. The symptoms have been stable. Her past medical history is significant for hyperlipidemia.  Hyperlipidemia This is a chronic problem. The current episode started more than 1 year ago. She has no history of chronic renal disease. Pertinent negatives include no chest pain. The current treatment provides moderate improvement of lipids.  Anxiety Presents for initial visit. Onset was 1 to 5 years ago. The problem has been gradually worsening. Symptoms include nervous/anxious behavior. Patient reports no chest pain, dizziness or palpitations.   Her past medical history is significant for anxiety/panic attacks. There is no history of anemia.     Past Medical History:  Diagnosis Date  . Anxiety   . Blood transfusion  without reported diagnosis   . Depression   . Hypertension    no medications at this time  . Hypothyroidism   . Iron deficiency anemia   . Thyroid disease      Family History  Problem Relation Age of Onset  . Thyroid disease Mother   . Hypertension Mother   . Diabetes Father   . Hypertension Other   . Diabetes Other   . Thyroid disease Other   . Heart disease Other      Current Outpatient Medications:  .  ALPRAZolam (XANAX XR) 1 MG 24 hr tablet, Take 1 mg by mouth at bedtime. , Disp: , Rfl:  .  cetirizine (ZYRTEC) 10 MG tablet, Take 10 mg by mouth every evening., Disp: , Rfl:  .  escitalopram (LEXAPRO) 10 MG tablet, Take 1 tablet (10 mg total) by mouth daily., Disp: 30 tablet, Rfl: 1 .  ibuprofen (ADVIL,MOTRIN) 600 MG tablet, Take 1 tablet (600 mg total) by mouth every 6 (six) hours as needed (mild pain)., Disp: 30 tablet, Rfl: 0 .  levothyroxine (SYNTHROID) 175 MCG tablet, TAKE 1 TABLET(175 MCG) BY MOUTH DAILY, Disp: 90 tablet, Rfl: 0 .  Vitamin D, Ergocalciferol, (DRISDOL) 1.25 MG (50000 UNIT) CAPS capsule, Take 1 capsule (50,000 Units total) by mouth 2 (two) times a week., Disp: 24 capsule, Rfl: 1   Allergies  Allergen Reactions  . Latex Rash  . Penicillins Rash    Has patient had a PCN reaction causing immediate rash, facial/tongue/throat swelling, SOB or lightheadedness with hypotension: Yes Has patient  had a PCN reaction causing severe rash involving mucus membranes or skin necrosis: No Has patient had a PCN reaction that required hospitalization No Has patient had a PCN reaction occurring within the last 10 years: No If all of the above answers are "NO", then may proceed with Cephalosporin use.      Review of Systems  Constitutional: Positive for weight loss. Negative for fatigue.  Respiratory: Negative.   Cardiovascular: Negative.  Negative for chest pain, palpitations and leg swelling.  Endocrine: Negative for polydipsia, polyphagia and polyuria.   Musculoskeletal: Negative.   Skin: Negative.   Neurological: Negative for dizziness and headaches.  Psychiatric/Behavioral: Negative for agitation. The patient is nervous/anxious.      Today's Vitals   11/28/19 1524  BP: 138/76  Pulse: 68  Temp: 97.7 F (36.5 C)  TempSrc: Oral  Weight: 173 lb 3.2 oz (78.6 kg)  Height: 5' 7.6" (1.717 m)  PainSc: 0-No pain   Body mass index is 26.65 kg/m.   Objective:  Physical Exam Vitals reviewed.  Constitutional:      General: She is not in acute distress.    Appearance: Normal appearance.  Cardiovascular:     Rate and Rhythm: Normal rate and regular rhythm.     Pulses: Normal pulses.     Heart sounds: No murmur heard.   Pulmonary:     Effort: Pulmonary effort is normal.     Breath sounds: Normal breath sounds.  Abdominal:     General: Abdomen is flat. Bowel sounds are normal. There is no distension.     Palpations: Abdomen is soft.     Tenderness: There is no abdominal tenderness.  Skin:    General: Skin is warm and dry.     Capillary Refill: Capillary refill takes less than 2 seconds.  Neurological:     General: No focal deficit present.     Mental Status: She is alert and oriented to person, place, and time.     Cranial Nerves: No cranial nerve deficit.  Psychiatric:        Mood and Affect: Mood normal.        Behavior: Behavior normal.        Thought Content: Thought content normal.        Judgment: Judgment normal.         Assessment And Plan:    1. Acquired hypothyroidism  Thyroid levels are currently normal  Will check thyroid levels - T3, free - T4 - TSH - Ambulatory referral to Endocrinology  2. Mixed hyperlipidemia  Chronic, controlled  No current medications - Lipid panel  3. Prediabetes  Chronic, controlled  Continue with current medications  Encouraged to limit intake of sugary foods and drinks  Encouraged to increase physical activity to 150 minutes per week  4. Hyperparathyroidism  [facility])  She has not been to have the parathyroid uptake imaging  Will refer to endocrinology at this time - Ambulatory referral to Endocrinology  5. Vitamin D deficiency  Chronic, controlled  Continue with current medications pending labs - VITAMIN D 25 Hydroxy (Vit-D Deficiency, Fractures) - Vitamin D, Ergocalciferol, (DRISDOL) 1.25 MG (50000 UNIT) CAPS capsule; Take 1 capsule (50,000 Units total) by mouth 2 (two) times a week.  Dispense: 24 capsule; Refill: 1  6. Encounter for hepatitis C screening test for low risk patient  Will check Hepatitis C screening due to recent recommendations to screen all adults 18 years and older - Hepatitis C antibody  7. Left knee pain, unspecified chronicity  Intermittent pain more at night, may be bursitis  She is to use diclofenac gel or voltaren as needed  She does report having a fall to this knee earlier in the year which aggravated another injury  Negative drawer test - diclofenac Sodium (VOLTAREN) 1 % GEL; Apply 2 g topically 4 (four) times daily.  Dispense: 100 g; Refill: 2      Marylu Lund, RN    THE PATIENT IS ENCOURAGED TO PRACTICE SOCIAL DISTANCING DUE TO THE COVID-19 PANDEMIC.

## 2019-11-28 NOTE — Patient Instructions (Signed)
Can get voltaren gel if diclofenac not covered.

## 2019-11-29 LAB — T3, FREE: T3, Free: 2.7 pg/mL (ref 2.0–4.4)

## 2019-11-29 LAB — VITAMIN D 25 HYDROXY (VIT D DEFICIENCY, FRACTURES): Vit D, 25-Hydroxy: 23.3 ng/mL — ABNORMAL LOW (ref 30.0–100.0)

## 2019-11-29 LAB — HEPATITIS C ANTIBODY: Hep C Virus Ab: <0.1 s/co ratio (ref 0.0–0.9)

## 2019-11-29 LAB — TSH: TSH: 0.219 u[IU]/mL — ABNORMAL LOW (ref 0.450–4.500)

## 2019-11-29 LAB — T4: T4, Total: 9.1 ug/dL (ref 4.5–12.0)

## 2020-02-14 ENCOUNTER — Encounter: Payer: Self-pay | Admitting: Internal Medicine

## 2020-02-14 ENCOUNTER — Other Ambulatory Visit: Payer: Self-pay

## 2020-02-14 ENCOUNTER — Ambulatory Visit: Payer: BC Managed Care – PPO | Admitting: Internal Medicine

## 2020-02-14 VITALS — BP 146/90 | HR 74 | Ht 67.6 in | Wt 175.4 lb

## 2020-02-14 DIAGNOSIS — E039 Hypothyroidism, unspecified: Secondary | ICD-10-CM

## 2020-02-14 DIAGNOSIS — E213 Hyperparathyroidism, unspecified: Secondary | ICD-10-CM

## 2020-02-14 DIAGNOSIS — E559 Vitamin D deficiency, unspecified: Secondary | ICD-10-CM | POA: Diagnosis not present

## 2020-02-14 LAB — T4, FREE: Free T4: 1.12 ng/dL (ref 0.60–1.60)

## 2020-02-14 LAB — TSH: TSH: 0.33 u[IU]/mL — ABNORMAL LOW (ref 0.35–4.50)

## 2020-02-14 NOTE — Patient Instructions (Signed)
Please stop at the lab.  Continue Ergocalciferol 50,000 units 2x a week.  Please come back for a follow-up appointment in 4 months.   Hypercalcemia Hypercalcemia is when the level of calcium in a person's blood is above normal. The body needs calcium to make bones and keep them strong. Calcium also helps the muscles, nerves, brain, and heart work the way they should. Most of the calcium in the body is in the bones. There is also some calcium in the blood. Hypercalcemia can happen when calcium comes out of the bones, or when the kidneys are not able to remove calcium from the blood. Hypercalcemia can be mild or severe. What are the causes? There are many possible causes of hypercalcemia. Common causes of this condition include:  Hyperparathyroidism. This is a condition in which the body produces too much parathyroid hormone. There are four parathyroid glands in your neck. These glands produce a chemical messenger (hormone) that helps the body absorb calcium from foods and helps your bones release calcium.  Certain kinds of cancer. Less common causes of hypercalcemia include:  Getting too much calcium or vitamin D from your diet.  Kidney failure.  Hyperthyroidism.  Severe dehydration.  Being on bed rest or being inactive for a long time.  Certain medicines.  Infections. What increases the risk? You are more likely to develop this condition if you:  Are female.  Are 37 years of age or older.  Have a family history of hypercalcemia. What are the signs or symptoms? Mild hypercalcemia that starts slowly may not cause symptoms. Severe, sudden hypercalcemia is more likely to cause symptoms, such as:  Being more thirsty than usual.  Needing to urinate more often than usual.  Abdominal pain.  Nausea and vomiting.  Constipation.  Muscle pain, twitching, or weakness.  Feeling very tired. How is this diagnosed?  Hypercalcemia is usually diagnosed with a blood test. You may  also have tests to help determine what is causing this condition, such as imaging tests and more blood tests. How is this treated? Treatment for hypercalcemia depends on the cause. Treatment may include:  Receiving fluids through an IV.  Medicines that: ? Keep calcium levels steady after receiving fluids (loop diuretics). ? Keep calcium in your bones (bisphosphonates). ? Lower the calcium level in your blood.  Surgery to remove overactive parathyroid glands.  A procedure that filters your blood to correct calcium levels (hemodialysis). Follow these instructions at home:   Take over-the-counter and prescription medicines only as told by your health care provider.  Follow instructions from your health care provider about eating or drinking restrictions.  Drink enough fluid to keep your urine pale yellow.  Stay active. Weight-bearing exercise helps to keep calcium in your bones. Follow instructions from your health care provider about what type and level of exercise is safe for you.  Keep all follow-up visits as told by your health care provider. This is important. Contact a health care provider if you have:  A fever.  A heartbeat that is irregular or very fast.  Changes in mood, memory, or personality. Get help right away if you:  Have severe abdominal pain.  Have chest pain.  Have trouble breathing.  Become very confused and sleepy.  Lose consciousness. Summary  Hypercalcemia is when the level of calcium in a person's blood is above normal. The body needs calcium to make bones and keep them strong. Calcium also helps the muscles, nerves, brain, and heart work the way they should.  There are many possible causes of hypercalcemia, and treatment depends on the cause.  Take over-the-counter and prescription medicines only as told by your health care provider.  Follow instructions from your health care provider about eating or drinking restrictions. This information is  not intended to replace advice given to you by your health care provider. Make sure you discuss any questions you have with your health care provider. Document Revised: 06/01/2018 Document Reviewed: 02/08/2018 Elsevier Patient Education  2020 [employer].

## 2020-02-14 NOTE — Progress Notes (Signed)
Patient ID: Amanda Henry, female   DOB: May 12, 1966, 54 y.o.   MRN: [id number]   This visit occurred during the SARS-CoV-2 public health emergency.  Safety protocols were in place, including screening questions prior to the visit, additional usage of staff PPE, and extensive cleaning of exam room while observing appropriate contact time as indicated for disinfecting solutions.   HPI  Amanda Henry is a 54 y.o.-year-old female, referred by her PCP, [name], FNP, for evaluation for hypercalcemia/hyperparathyroidism and also hypothyroidism.  Pt was dx with hypercalcemia in 2018 and hyperparathyroidism in 05/2019. I reviewed pt's pertinent labs: Lab Results  Component Value Date   PTH 163 (H) 06/14/2019   CALCIUM 14.5 (HH) 05/31/2019   CALCIUM 10.5 (H) 10/02/2016   CALCIUM 10.3 09/18/2016   CALCIUM 10.2 09/10/2016   CALCIUM 9.7 08/28/2016   CALCIUM 10.5 (H) 05/28/2016   CALCIUM 10.4 04/21/2016   CALCIUM 10.1 02/14/2016   No h/o osteoporosis.  No DXA scans available for review.  No fractures or falls.   No h/o kidney stones.  No h/o CKD. Last BUN/Cr: Lab Results  Component Value Date   BUN 9 05/31/2019   BUN 8 10/02/2016   CREATININE 0.77 05/31/2019   CREATININE 0.69 10/02/2016   Of note, her alkaline phosphatase was elevated, but at that time she also had a low vitamin D: Lab Results  Component Value Date   ALKPHOS 186 (H) 05/31/2019   SPEP was normal: Component     Latest Ref Rng & Units 05/31/2019 06/14/2019  Total Protein     6.0 - 8.5 g/dL 7.3 7.3  Albumin ELP     2.9 - 4.4 g/dL 4.0 3.9  Alpha 1     0.0 - 0.4 g/dL 0.2 0.2  Alpha 2     0.4 - 1.0 g/dL 0.6 0.7  Beta     0.7 - 1.3 g/dL 1.0 1.1  Gamma Globulin     0.4 - 1.8 g/dL 1.5 1.5  M-SPIKE, %     Not Observed g/dL Not Observed Not Observed  Globulin, Total     2.2 - 3.9 g/dL 3.3 3.4  A/G Ratio     0.7 - 1.7 1.2 1.1   Pt is not on HCTZ.  HIV test was negative in 05/2019.  Vitamin D  deficiency:  Reviewed vit D levels: Lab Results  Component Value Date   VD25OH 23.3 (L) 11/28/2019   VD25OH 12.4 (L) 05/31/2019   Pt is on ergocalciferol 50,000 units twice a week. Not consistently.  Pt does not have a FH of hypercalcemia, pituitary tumors, thyroid cancer, or osteoporosis.   Hypothyroidism:  - s/p RAI tx for Graves 1991  She continues on levothyroxine 175 mcg daily: - in am - fasting - at least 30 min from b'fast - no Ca, Fe, MVI, PPIs - stopped Biotin - 6 mo ago  Reviewed her TFTs: Lab Results  Component Value Date   TSH 0.219 (L) 11/28/2019   TSH 0.622 05/31/2019   TSH 1.900 10/21/2018   TSH 6.510 (H) 09/07/2018   TSH 25.010 (H) 06/03/2018   Lab Results  Component Value Date   FREET4 1.17 09/07/2018   FREET4 0.41 (L) 06/03/2018   Lab Results  Component Value Date   T3FREE 2.7 11/28/2019   T3FREE 2.7 05/31/2019   T3FREE 2.6 10/21/2018   Pt denies: - feeling nodules in neck - hoarseness - dysphagia - choking - SOB with lying down  She has family history of thyroid disease in  M and M uncles.  No Fh of thyroid cancer. + h/o radiation tx to head or neck - in 1991 (RAI for Graves).  No herbal supplements. No recent steroids use.   ROS: Constitutional: no weight gain/+ wt loss (30 lbs, then gained few back), + fatigue, no subjective hyperthermia/hypothermia Eyes: + Blurry vision, no xerophthalmia ENT: no sore throat, + see HPI, + tinnitus Cardiovascular: no CP/SOB/+palpitations/no leg swelling Respiratory: + Cough/no SOB Gastrointestinal: no N/V/D/+ C Musculoskeletal: + Both: Muscle/joint aches Skin: no rashes Neurological: + tremors/no numbness/tingling/dizziness Psychiatric: + Depression/+ anxiety  Past Medical History:  Diagnosis Date  . Anxiety   . Blood transfusion without reported diagnosis   . Depression   . Hypertension    no medications at this time  . Hypothyroidism   . Iron deficiency anemia   . Thyroid disease    Past  Surgical History:  Procedure Laterality Date  . LAPAROSCOPIC VAGINAL HYSTERECTOMY WITH SALPINGECTOMY Bilateral 10/01/2016   Procedure: LAPAROSCOPIC ASSISTED VAGINAL HYSTERECTOMY WITH SALPINGECTOMY;  Surgeon: Paula Compton, MD;  Location: [facility];  Service: Gynecology;  Laterality: Bilateral;  . WISDOM TOOTH EXTRACTION     Social History   Socioeconomic History  . Marital status: Single    Spouse name: Not on file  . Number of children: 1  . Years of education: Not on file  . Highest education level: Not on file  Occupational History  . Occupation: Financial planner  Tobacco Use  . Smoking status: Never Smoker  . Smokeless tobacco: Never Used  Substance and Sexual Activity  . Alcohol use: No  . Drug use: No  . Sexual activity: Yes    Birth control/protection: Condom  Other Topics Concern  . Not on file  Social History Narrative  . Not on file   Social Determinants of Health   Financial Resource Strain:   . Difficulty of Paying Living Expenses: Not on file  Food Insecurity:   . Worried About Charity fundraiser in the Last Year: Not on file  . Ran Out of Food in the Last Year: Not on file  Transportation Needs:   . Lack of Transportation (Medical): Not on file  . Lack of Transportation (Non-Medical): Not on file  Physical Activity:   . Days of Exercise per Week: Not on file  . Minutes of Exercise per Session: Not on file  Stress:   . Feeling of Stress : Not on file  Social Connections:   . Frequency of Communication with Friends and Family: Not on file  . Frequency of Social Gatherings with Friends and Family: Not on file  . Attends Religious Services: Not on file  . Active Member of Clubs or Organizations: Not on file  . Attends Archivist Meetings: Not on file  . Marital Status: Not on file  Intimate Partner Violence:   . Fear of Current or Ex-Partner: Not on file  . Emotionally Abused: Not on file  . Physically Abused: Not on file  .  Sexually Abused: Not on file   Current Outpatient Medications on File Prior to Visit  Medication Sig Dispense Refill  . ALPRAZolam (XANAX XR) 1 MG 24 hr tablet Take 1 mg by mouth at bedtime.     . cetirizine (ZYRTEC) 10 MG tablet Take 10 mg by mouth every evening.    . diclofenac Sodium (VOLTAREN) 1 % GEL Apply 2 g topically 4 (four) times daily. 100 g 2  . escitalopram (LEXAPRO) 10 MG tablet Take 1 tablet (  10 mg total) by mouth daily. 30 tablet 1  . ibuprofen (ADVIL,MOTRIN) 600 MG tablet Take 1 tablet (600 mg total) by mouth every 6 (six) hours as needed (mild pain). 30 tablet 0  . levothyroxine (SYNTHROID) 175 MCG tablet TAKE 1 TABLET(175 MCG) BY MOUTH DAILY 90 tablet 0  . Vitamin D, Ergocalciferol, (DRISDOL) 1.25 MG (50000 UNIT) CAPS capsule Take 1 capsule (50,000 Units total) by mouth 2 (two) times a week. 24 capsule 1   No current facility-administered medications on file prior to visit.   Allergies  Allergen Reactions  . Latex Rash  . Penicillins Rash    Has patient had a PCN reaction causing immediate rash, facial/tongue/throat swelling, SOB or lightheadedness with hypotension: Yes Has patient had a PCN reaction causing severe rash involving mucus membranes or skin necrosis: No Has patient had a PCN reaction that required hospitalization No Has patient had a PCN reaction occurring within the last 10 years: No If all of the above answers are "NO", then may proceed with Cephalosporin use.    Family History  Problem Relation Age of Onset  . Thyroid disease Mother   . Hypertension Mother   . Diabetes Father   . Hypertension Other   . Diabetes Other   . Thyroid disease Other   . Heart disease Other     PE: BP (!) 146/90 (BP Location: Left Arm, Patient Position: Sitting, Cuff Size: Small)   Pulse 74   Ht 5' 7.6" (1.717 m)   Wt 175 lb 6.4 oz (79.6 kg)   LMP 08/28/2016 (Exact Date) Comment: come back on 09/15/2016  SpO2 96%   BMI 26.99 kg/m  Wt Readings from Last 3  Encounters:  02/14/20 175 lb 6.4 oz (79.6 kg)  11/28/19 173 lb 3.2 oz (78.6 kg)  08/29/19 175 lb 3.2 oz (79.5 kg)   Constitutional: overweight, in NAD. No kyphosis. Eyes: PERRLA, EOMI, + bilateral symmetric exophthalmos, no lid lag ENT: moist mucous membranes, no thyromegaly, no cervical lymphadenopathy Cardiovascular: RRR, No MRG Respiratory: CTA B Gastrointestinal: abdomen soft, NT, ND, BS+ Musculoskeletal: no deformities, strength intact in all 4 Skin: moist, warm, no rashes Neurological: no tremor with outstretched hands, DTR normal in all 4  Assessment: 1. Hypercalcemia/hyperparathyroidism  2.  Acquired Hypothyroidism  3.  Vitamin D deficiency  Plan: Patient has had several instances of elevated calcium, with a very high calcium level, at 14.5 in 05/2019. An intact PTH level obtained 2 weeks later was high, at 163. - Patient also  has vitamin D deficiency,  with the last level being 12.4 at the time that the above labs were obtained.  It improved later to 23.3 in 11/2019. - No apparent complications from hypercalcemia: no h/o nephrolithiasis, no osteoporosis, no fractures. No abdominal pain, bone pain.  She does have constipation. - I discussed with the patient about the physiology of calcium and parathyroid hormone, and possible side effects from increased PTH, including kidney stones, osteoporosis, abdominal pain, etc.  - We discussed that we need to check whether her hyperparathyroidism is primary (Familial hypercalcemic hypocalciuria or parathyroid adenoma) or secondary (to conditions like: vitamin D deficiency, calcium malabsorption, hypercalciuria, renal insufficiency, etc.). - I discussed with her that we first need to bring her vitamin D level to normal so we can further investigate the parathyroid status. I explained that in the setting of a low vitamin D, the parathyroid hormone can be elevated, which is not a pathologic finding. However, if the PTH is elevated in the  setting of a normal vitamin D, we will further need to investigate her for primary or secondary hyperparathyroidism. - after we normalize the vitamin D level, we'll need to check: calcium level intact PTH ([facility]) Magnesium Phosphorus vitamin D 1,25 HO 24h urinary calcium/creatinine ratio - We discussed possible consequences of hyperparathyroidism: ~1/3 pts will develop complications over 15 years (OP, nephrolithiasis).  - If the tests indicate a parathyroid adenoma, she agrees with a referral to surgery.  - Criteria for parathyroid surgery are:  . Increased calcium by more than 1 mg/dL above the upper limit of normal  . Kidney ds.  . Osteoporosis (or vertebral fracture) . Age <19 years old Newer criteria (2013): Marland Kitchen High UCa >400 mg/d and increased stone risk by biochemical stone risk analysis . Presence of nephrolithiasis or nephrocalcinosis . Pt's preference!  - We may need to check a DXA scan to see if she has osteoporosis (+ add a 33% distal radius for evaluation of cortical bone, which is predominantly affected by hyperparathyroidism).  - I will advise her about vitamin D supplement dose when the results of the vitamin D level are back. - I will see the patient back in 4 months  2.  Acquired hypothyroidism - latest thyroid labs reviewed with pt >> TSH suppressed: Lab Results  Component Value Date   TSH 0.219 (L) 11/28/2019   - she continues on LT4 175 mcg daily - pt feels good on this dose. - we discussed about taking the thyroid hormone every day, with water, >30 minutes before breakfast, separated by >4 hours from acid reflux medications, calcium, iron, multivitamins. Pt. is taking it correctly. - will check thyroid tests today: TSH and fT4 - If labs are abnormal, she will need to return for repeat TFTs in 1.5 months  3.  Vitamin D deficiency -Latest vitamin D level was still low, although improved, we 07/21: 23.3, improved from 12.4. -She is on 50,000 units of  ergocalciferol twice a week, but missing doses, if she forgets it.  She does have a bottle at home so would like to stay on the higher dose formulation if possible. -We will recheck the level today.  - If still low, we may need to switch to high-dose cholecalciferol daily  Component     Latest Ref Rng & Units 02/14/2020  TSH     0.35 - 4.50 uIU/mL 0.33 (L)  T4,Free(Direct)     0.60 - 1.60 ng/dL 1.12  Vitamin D, 25-Hydroxy     30.0 - 100.0 ng/mL 28.3 (L)  TSH is slightly low, will need to decrease the dose of levothyroxine to 150 mcg daily and recheck in 1.5 months. Vitamin D level is lower than target, but continues to improve.  Advised her to take Ergocalciferol 50,000 units twice a week, consistently.  We will recheck this in 1.5 months.  At that time, we will also check parathyroid labs.  We will also give her a 24-hour urine jug at that time.  Orders Placed This Encounter  Procedures  . TSH  . T4, free  . VITAMIN D 25 Hydroxy (Vit-D Deficiency, Fractures)  . BASIC METABOLIC PANEL WITH GFR  . Calcium, urine, 24 hour  . Creatinine, urine, 24 hour  . Magnesium  . Phosphorus  . Vitamin D 1,25 dihydroxy  . Parathyroid hormone, intact (no Ca)   Philemon Kingdom, MD PhD [facility]

## 2020-02-15 ENCOUNTER — Other Ambulatory Visit: Payer: Self-pay | Admitting: Internal Medicine

## 2020-02-15 ENCOUNTER — Telehealth: Payer: Self-pay

## 2020-02-15 DIAGNOSIS — E039 Hypothyroidism, unspecified: Secondary | ICD-10-CM

## 2020-02-15 LAB — VITAMIN D 25 HYDROXY (VIT D DEFICIENCY, FRACTURES): Vit D, 25-Hydroxy: 28.3 ng/mL — ABNORMAL LOW (ref 30.0–100.0)

## 2020-02-15 MED ORDER — LEVOTHYROXINE SODIUM 150 MCG PO TABS: ORAL_TABLET | ORAL | 3 refills | Status: DC

## 2020-02-15 NOTE — Telephone Encounter (Signed)
-----   Message from Philemon Kingdom, MD sent at 02/15/2020 12:28 PM EDT ----- Amanda Henry, can you please call pt:  TSH is slightly low, will need to decrease the dose of levothyroxine to 150 mcg daily and recheck in 1.5 months - sent Rx. Vitamin D level is lower than target, but continues to improve.  Please continue Ergocalciferol 50,000 units twice a week, but take it consistently.  We will recheck this in 1.5 months.  At that time, we will also check parathyroid labs.  We will also need to  give her a 24-hour urine jug at that time. Please advise her about how to do the urine collection (or may send this to her by mail):  Patient information (Up-to-Date): Collection of a 24-hour urine specimen   - You should collect every drop of urine during each 24-hour period. It does not matter how much or little urine is passed each time, as long as every drop is collected. - Begin the urine collection in the morning after you wake up, after you have emptied your bladder for the first time. - Urinate (empty the bladder) for the first time and flush it down the toilet. Note the exact time (eg, 6:15 AM). You will begin the urine collection at this time. - Collect every drop of urine during the day and night in an empty collection bottle. Store the bottle at room temperature or in the refrigerator. - If you need to have a bowel movement, any urine passed with the bowel movement should be collected. Try not to include feces with the urine collection. If feces does get mixed in, do not try to remove the feces from the urine collection bottle. - Finish by collecting the first urine passed the next morning, adding it to the collection bottle. This should be within ten minutes before or after the time of the first morning void on the first day (which was flushed). In this example, you would try to void between 6:05 and 6:25 on the second day. - If you need to urinate one hour before the final collection time, drink a full  glass of water so that you can void again at the appropriate time. If you have to urinate 20 minutes before, try to hold the urine until the proper time. - Please note the exact time of the final collection, even if it is not the same time as when collection began on day 1. - The bottle(s) may be kept at room temperature for a day or two, but should be kept cool or refrigerated for longer periods of time.  Ty! C

## 2020-02-15 NOTE — Telephone Encounter (Signed)
Tried to call but unable to LVM.

## 2020-02-16 NOTE — Telephone Encounter (Signed)
Called, no answer.

## 2020-02-21 ENCOUNTER — Telehealth: Payer: Self-pay

## 2020-02-21 NOTE — Telephone Encounter (Signed)
LVM--to call the office back regarding lab results. 

## 2020-02-21 NOTE — Telephone Encounter (Signed)
Patient given result and will schedule follow up

## 2020-02-29 ENCOUNTER — Ambulatory Visit (INDEPENDENT_AMBULATORY_CARE_PROVIDER_SITE_OTHER): Payer: BC Managed Care – PPO | Admitting: Nurse Practitioner

## 2020-02-29 DIAGNOSIS — E039 Hypothyroidism, unspecified: Secondary | ICD-10-CM | POA: Diagnosis not present

## 2020-02-29 NOTE — Progress Notes (Signed)
I,Yamilka Roman [employer] as a Education administrator for [employer], FNP.,have documented all relevant documentation on the behalf of Amanda Brine, FNP,as directed by  Amanda Brine, FNP while in the presence of Amanda Henry, [place]. This visit occurred during the SARS-CoV-2 public health emergency.  Safety protocols were in place, including screening questions prior to the visit, additional usage of staff PPE, and extensive cleaning of exam room while observing appropriate contact time as indicated for disinfecting solutions.  Subjective:     Patient ID: Amanda Henry , female    DOB: Feb 17, 1966 , 54 y.o.   MRN: [id number]   Chief Complaint  Patient presents with   Hypothyroidism    HPI     Hyperlipidemia This is a chronic problem. The current episode started more than 1 year ago. She has no history of chronic renal disease. The current treatment provides moderate improvement of lipids.  Thyroid Problem Presents for follow-up visit. Symptoms include weight loss. Patient reports no fatigue or nail problem. The symptoms have been stable. Her past medical history is significant for hyperlipidemia.     Past Medical History:  Diagnosis Date   Anxiety    Blood transfusion without reported diagnosis    Depression    Hypertension    no medications at this time   Hypothyroidism    Iron deficiency anemia    Thyroid disease      Family History  Problem Relation Age of Onset   Thyroid disease Mother    Hypertension Mother    Diabetes Father    Hypertension Other    Diabetes Other    Thyroid disease Other    Heart disease Other      Current Outpatient Medications:    ALPRAZolam (XANAX XR) 1 MG 24 hr tablet, Take 1 mg by mouth at bedtime. , Disp: , Rfl:    cetirizine (ZYRTEC) 10 MG tablet, Take 10 mg by mouth every evening., Disp: , Rfl:    diclofenac Sodium (VOLTAREN) 1 % GEL, Apply 2 g topically 4 (four) times daily., Disp: 100 g, Rfl: 2   escitalopram (LEXAPRO) 10  MG tablet, Take 1 tablet (10 mg total) by mouth daily., Disp: 30 tablet, Rfl: 1   ibuprofen (ADVIL,MOTRIN) 600 MG tablet, Take 1 tablet (600 mg total) by mouth every 6 (six) hours as needed (mild pain)., Disp: 30 tablet, Rfl: 0   levothyroxine (SYNTHROID) 150 MCG tablet, TAKE 1 TABLET(175 MCG) BY MOUTH DAILY before breakfast, Disp: 45 tablet, Rfl: 3   Vitamin D, Ergocalciferol, (DRISDOL) 1.25 MG (50000 UNIT) CAPS capsule, Take 1 capsule (50,000 Units total) by mouth 2 (two) times a week., Disp: 24 capsule, Rfl: 1   Allergies  Allergen Reactions   Latex Rash   Penicillins Rash    Has patient had a PCN reaction causing immediate rash, facial/tongue/throat swelling, SOB or lightheadedness with hypotension: Yes Has patient had a PCN reaction causing severe rash involving mucus membranes or skin necrosis: No Has patient had a PCN reaction that required hospitalization No Has patient had a PCN reaction occurring within the last 10 years: No If all of the above answers are "NO", then may proceed with Cephalosporin use.      Review of Systems  Constitutional: Positive for weight loss. Negative for fatigue.  Respiratory: Negative.      There were no vitals filed for this visit. There is no height or weight on file to calculate BMI.   Objective:  Physical Exam Vitals reviewed.  Constitutional:  General: She is not in acute distress.    Appearance: Normal appearance.  Cardiovascular:     Rate and Rhythm: Normal rate and regular rhythm.     Pulses: Normal pulses.     Heart sounds: No murmur heard.   Pulmonary:     Effort: Pulmonary effort is normal.     Breath sounds: Normal breath sounds.  Abdominal:     General: Abdomen is flat. Bowel sounds are normal. There is no distension.     Palpations: Abdomen is soft.     Tenderness: There is no abdominal tenderness.  Skin:    General: Skin is warm and dry.     Capillary Refill: Capillary refill takes less than 2 seconds.   Neurological:     General: No focal deficit present.     Mental Status: She is alert and oriented to person, place, and time.     Cranial Nerves: No cranial nerve deficit.  Psychiatric:        Mood and Affect: Mood normal.        Behavior: Behavior normal.        Thought Content: Thought content normal.        Judgment: Judgment normal.         Assessment And Plan:     1. Acquired hypothyroidism  She is now being followed by Dr. Renne Crigler and evaluating for hyperparathyroid  She continues with levothyroxine being managed by Dr. Renne Crigler  Her last appt her TSH was overly productive     Patient was given opportunity to ask questions. Patient verbalized understanding of the plan and was able to repeat key elements of the plan. All questions were answered to their satisfaction.    Teola Bradley, FNP, have reviewed all documentation for this visit. The documentation on 03/08/20 for the exam, diagnosis, procedures, and orders are all accurate and complete.  THE PATIENT IS ENCOURAGED TO PRACTICE SOCIAL DISTANCING DUE TO THE COVID-19 PANDEMIC.

## 2020-02-29 NOTE — Patient Instructions (Signed)
Hypothyroidism  Hypothyroidism is when the thyroid gland does not make enough of certain hormones (it is underactive). The thyroid gland is a small gland located in the lower front part of the neck, just in front of the windpipe (trachea). This gland makes hormones that help control how the body uses food for energy (metabolism) as well as how the heart and brain function. These hormones also play a role in keeping your bones strong. When the thyroid is underactive, it produces too little of the hormones thyroxine (T4) and triiodothyronine (T3). What are the causes? This condition may be caused by:  Hashimoto's disease. This is a disease in which the body's disease-fighting system (immune system) attacks the thyroid gland. This is the most common cause.  Viral infections.  Pregnancy.  Certain medicines.  Birth defects.  Past radiation treatments to the head or neck for cancer.  Past treatment with radioactive iodine.  Past exposure to radiation in the environment.  Past surgical removal of part or all of the thyroid.  Problems with a gland in the center of the brain (pituitary gland).  Lack of enough iodine in the diet. What increases the risk? You are more likely to develop this condition if:  You are female.  You have a family history of thyroid conditions.  You use a medicine called lithium.  You take medicines that affect the immune system (immunosuppressants). What are the signs or symptoms? Symptoms of this condition include:  Feeling as though you have no energy (lethargy).  Not being able to tolerate cold.  Weight gain that is not explained by a change in diet or exercise habits.  Lack of appetite.  Dry skin.  Coarse hair.  Menstrual irregularity.  Slowing of thought processes.  Constipation.  Sadness or depression. How is this diagnosed? This condition may be diagnosed based on:  Your symptoms, your medical history, and a physical exam.  Blood  tests. You may also have imaging tests, such as an ultrasound or MRI. How is this treated? This condition is treated with medicine that replaces the thyroid hormones that your body does not make. After you begin treatment, it may take several weeks for symptoms to go away. Follow these instructions at home:  Take over-the-counter and prescription medicines only as told by your health care provider.  If you start taking any new medicines, tell your health care provider.  Keep all follow-up visits as told by your health care provider. This is important. ? As your condition improves, your dosage of thyroid hormone medicine may change. ? You will need to have blood tests regularly so that your health care provider can monitor your condition. Contact a health care provider if:  Your symptoms do not get better with treatment.  You are taking thyroid replacement medicine and you: ? Sweat a lot. ? Have tremors. ? Feel anxious. ? Lose weight rapidly. ? Cannot tolerate heat. ? Have emotional swings. ? Have diarrhea. ? Feel weak. Get help right away if you have:  Chest pain.  An irregular heartbeat.  A rapid heartbeat.  Difficulty breathing. Summary  Hypothyroidism is when the thyroid gland does not make enough of certain hormones (it is underactive).  When the thyroid is underactive, it produces too little of the hormones thyroxine (T4) and triiodothyronine (T3).  The most common cause is Hashimoto's disease, a disease in which the body's disease-fighting system (immune system) attacks the thyroid gland. The condition can also be caused by viral infections, medicine, pregnancy, or past   radiation treatment to the head or neck.  Symptoms may include weight gain, dry skin, constipation, feeling as though you do not have energy, and not being able to tolerate cold.  This condition is treated with medicine to replace the thyroid hormones that your body does not make. This information  is not intended to replace advice given to you by your health care provider. Make sure you discuss any questions you have with your health care provider. Document Revised: 04/17/2017 Document Reviewed: 04/15/2017 Elsevier Patient Education  2020 [employer] Inc[employer]  

## 2020-03-02 ENCOUNTER — Other Ambulatory Visit: Payer: Self-pay | Admitting: Nurse Practitioner

## 2020-03-02 DIAGNOSIS — E039 Hypothyroidism, unspecified: Secondary | ICD-10-CM

## 2020-03-08 ENCOUNTER — Encounter: Payer: Self-pay | Admitting: Nurse Practitioner

## 2020-03-13 ENCOUNTER — Encounter: Payer: Self-pay | Admitting: Nurse Practitioner

## 2020-03-13 ENCOUNTER — Other Ambulatory Visit: Payer: Self-pay

## 2020-03-13 ENCOUNTER — Ambulatory Visit: Payer: BC Managed Care – PPO | Admitting: Nurse Practitioner

## 2020-03-13 VITALS — BP 134/82 | HR 76 | Temp 98.4°F | Ht 67.6 in | Wt 176.2 lb

## 2020-03-13 DIAGNOSIS — E213 Hyperparathyroidism, unspecified: Secondary | ICD-10-CM | POA: Diagnosis not present

## 2020-03-13 DIAGNOSIS — E039 Hypothyroidism, unspecified: Secondary | ICD-10-CM | POA: Diagnosis not present

## 2020-03-13 DIAGNOSIS — M25562 Pain in left knee: Secondary | ICD-10-CM

## 2020-03-13 DIAGNOSIS — Z1231 Encounter for screening mammogram for malignant neoplasm of breast: Secondary | ICD-10-CM | POA: Diagnosis not present

## 2020-03-13 NOTE — Patient Instructions (Signed)
[phone] Dr. Renne Crigler call to office to get info on office hours to pick up 24 hour urine jug.

## 2020-03-13 NOTE — Progress Notes (Signed)
I,Yamilka Roman [employer] as a Education administrator for [employer], FNP.,have documented all relevant documentation on the behalf of [name], FNP,as directed by  [name], FNP while in the presence of [name], [place]. This visit occurred during the SARS-CoV-2 public health emergency.  Safety protocols were in place, including screening questions prior to the visit, additional usage of staff PPE, and extensive cleaning of exam room while observing appropriate contact time as indicated for disinfecting solutions.  Subjective:     Patient ID: Amanda Henry , female    DOB: 04-10-1966 , 54 y.o.   MRN: [id number]   Chief Complaint  Patient presents with  . Hypothyroidism    HPI   She has seen Dr. Renne Crigler and is to follow up with her in the next couple months. There are plans to obtain additional labs once she normalizes her vitamin d. According to Dr. Chrissie Noa note she it to do a 24 hour urine. She is unsure of how to go about doing this.  She is taking lexapro prescribed by Dr. Toy Care.    Hyperlipidemia This is a chronic problem. The current episode started more than 1 year ago. She has no history of chronic renal disease. Pertinent negatives include no chest pain. The current treatment provides moderate improvement of lipids. Risk factors for coronary artery disease include a sedentary lifestyle.  Thyroid Problem Presents for follow-up visit. Symptoms include weight loss. Patient reports no fatigue, nail problem or palpitations. The symptoms have been stable. Her past medical history is significant for hyperlipidemia.     Past Medical History:  Diagnosis Date  . Anxiety   . Blood transfusion without reported diagnosis   . Depression   . Hypertension    no medications at this time  . Hypothyroidism   . Iron deficiency anemia   . Thyroid disease      Family History  Problem Relation Age of Onset  . Thyroid disease Mother   . Hypertension Mother   . Diabetes Father   .  Hypertension Other   . Diabetes Other   . Thyroid disease Other   . Heart disease Other      Current Outpatient Medications:  .  ALPRAZolam (XANAX XR) 1 MG 24 hr tablet, Take 1 mg by mouth at bedtime. , Disp: , Rfl:  .  cetirizine (ZYRTEC) 10 MG tablet, Take 10 mg by mouth every evening., Disp: , Rfl:  .  diclofenac Sodium (VOLTAREN) 1 % GEL, Apply 2 g topically 4 (four) times daily., Disp: 100 g, Rfl: 2 .  escitalopram (LEXAPRO) 10 MG tablet, Take 1 tablet (10 mg total) by mouth daily., Disp: 30 tablet, Rfl: 1 .  ibuprofen (ADVIL,MOTRIN) 600 MG tablet, Take 1 tablet (600 mg total) by mouth every 6 (six) hours as needed (mild pain)., Disp: 30 tablet, Rfl: 0 .  levothyroxine (SYNTHROID) 150 MCG tablet, TAKE 1 TABLET(175 MCG) BY MOUTH DAILY before breakfast, Disp: 45 tablet, Rfl: 3 .  Vitamin D, Ergocalciferol, (DRISDOL) 1.25 MG (50000 UNIT) CAPS capsule, Take 1 capsule (50,000 Units total) by mouth 2 (two) times a week., Disp: 24 capsule, Rfl: 1   Allergies  Allergen Reactions  . Latex Rash  . Penicillins Rash    Has patient had a PCN reaction causing immediate rash, facial/tongue/throat swelling, SOB or lightheadedness with hypotension: Yes Has patient had a PCN reaction causing severe rash involving mucus membranes or skin necrosis: No Has patient had a PCN reaction that required hospitalization No Has patient had a PCN  reaction occurring within the last 10 years: No If all of the above answers are "NO", then may proceed with Cephalosporin use.      Review of Systems  Constitutional: Positive for weight loss. Negative for fatigue.  Respiratory: Negative.   Cardiovascular: Negative.  Negative for chest pain, palpitations and leg swelling.  Endocrine: Negative for polydipsia, polyphagia and polyuria.  Neurological: Negative for dizziness and headaches.  Psychiatric/Behavioral: Negative.      Today's Vitals   03/13/20 1634  BP: 134/82  Pulse: 76  Temp: 98.4 F (36.9 C)   TempSrc: Oral  Weight: 176 lb 3.2 oz (79.9 kg)  Height: 5' 7.6" (1.717 m)   Body mass index is 27.11 kg/m.   Objective:  Physical Exam Vitals reviewed.  Constitutional:      General: She is not in acute distress.    Appearance: Normal appearance.  Cardiovascular:     Rate and Rhythm: Normal rate and regular rhythm.     Pulses: Normal pulses.     Heart sounds: No murmur heard.   Pulmonary:     Effort: Pulmonary effort is normal.     Breath sounds: Normal breath sounds.  Abdominal:     General: Abdomen is flat. Bowel sounds are normal. There is no distension.     Palpations: Abdomen is soft.     Tenderness: There is no abdominal tenderness.  Musculoskeletal:        General: Tenderness present. Normal range of motion.     Right lower leg: No swelling, deformity or tenderness. No edema.     Left lower leg: Tenderness (left knee with generalized tenderness) present. No swelling or deformity. No edema.     Comments: Negative drawer and lachman's. Negative for any ballotment  Skin:    General: Skin is warm and dry.     Capillary Refill: Capillary refill takes less than 2 seconds.  Neurological:     General: No focal deficit present.     Mental Status: She is alert and oriented to person, place, and time.     Cranial Nerves: No cranial nerve deficit.  Psychiatric:        Mood and Affect: Mood normal.        Behavior: Behavior normal.        Thought Content: Thought content normal.        Judgment: Judgment normal.         Assessment And Plan:     1. Acquired hypothyroidism  Continue with follow up with Endocrinology  Chronic, controlled  Continue with current medications as per Dr. Renne Crigler  I did find out from Dr. Renne Crigler office she will need to go to their lab to get the 24 hour urine container with the instructions.  She is also to reach out to them to see if there is anything else she needs to do.    2. Left knee pain, unspecified chronicity  Generalized  tenderness, this is beginning to affect her daily lifestyle  Will refer to orthopedics for further evaluation - Ambulatory referral to Orthopedic Surgery  3. Screening mammogram, encounter for  Pt instructed on Self Breast Exam.According to ACOG guidelines Women aged 76 and older are recommended to get an annual mammogram. Form completed and given to patient contact the [facility] for appointment scheduing.   Pt encouraged to get annual mammogram - MM DIGITAL SCREENING BILATERAL; Future   4. Hyperparathyroidism [facility])  She is seeing Dr. Renne Crigler to confirm and determine the type of  hyperparathyroidism she may have. She is to be consistent with the vitamin d and have her labs further evaluated    Patient was given opportunity to ask questions. Patient verbalized understanding of the plan and was able to repeat key elements of the plan. All questions were answered to their satisfaction.   Teola Bradley, FNP, have reviewed all documentation for this visit. The documentation on 03/16/20 for the exam, diagnosis, procedures, and orders are all accurate and complete.  THE PATIENT IS ENCOURAGED TO PRACTICE SOCIAL DISTANCING DUE TO THE COVID-19 PANDEMIC.

## 2020-05-11 ENCOUNTER — Other Ambulatory Visit: Payer: Self-pay | Admitting: Nurse Practitioner

## 2020-05-11 DIAGNOSIS — E559 Vitamin D deficiency, unspecified: Secondary | ICD-10-CM

## 2020-05-31 ENCOUNTER — Encounter: Payer: Self-pay | Admitting: Nurse Practitioner

## 2020-06-15 ENCOUNTER — Ambulatory Visit: Payer: BC Managed Care – PPO | Admitting: Internal Medicine

## 2020-06-19 ENCOUNTER — Telehealth: Payer: Self-pay

## 2020-06-19 NOTE — Telephone Encounter (Signed)
Patient consented to virtual appointment. YL,RMA 

## 2020-06-20 ENCOUNTER — Encounter: Payer: Self-pay | Admitting: Nurse Practitioner

## 2020-06-20 ENCOUNTER — Other Ambulatory Visit: Payer: Self-pay

## 2020-06-20 ENCOUNTER — Telehealth (INDEPENDENT_AMBULATORY_CARE_PROVIDER_SITE_OTHER): Payer: Self-pay | Admitting: Nurse Practitioner

## 2020-06-20 DIAGNOSIS — R059 Cough, unspecified: Secondary | ICD-10-CM

## 2020-06-20 MED ORDER — IPRATROPIUM BROMIDE 0.06 % NA SOLN: 2.0000 | Freq: Three times a day (TID) | NASAL | 2 refills | Status: DC

## 2020-06-20 NOTE — Progress Notes (Signed)
Virtual Visit via My chart   This visit type was conducted due to national recommendations for restrictions regarding the COVID-19 Pandemic (e.g. social distancing) in an effort to limit this patient's exposure and mitigate transmission in our community.  Due to her co-morbid illnesses, this patient is at least at moderate risk for complications without adequate follow up.  This format is felt to be most appropriate for this patient at this time.  All issues noted in this document were discussed and addressed.  A limited physical exam was performed with this format.    This visit type was conducted due to national recommendations for restrictions regarding the COVID-19 Pandemic (e.g. social distancing) in an effort to limit this patient's exposure and mitigate transmission in our community.  Patients identity confirmed using two different identifiers.  This format is felt to be most appropriate for this patient at this time.  All issues noted in this document were discussed and addressed.  No physical exam was performed (except for noted visual exam findings with Video Visits).    Date:  06/20/2020   ID:  Amanda Henry, DOB 08-05-1965, MRN [id number]  Patient Location:  Home - spoke with [name]  Provider location:   Office    Chief Complaint:  Home - spoke with [place]  History of Present Illness:    Amanda Henry is a 55 y.o. female who presents via video conferencing for a telehealth visit today.    The patient does not have symptoms concerning for COVID-19 infection (fever, chills, cough, or new shortness of breath).   Cough is worse at night.  She had been clearing her throat multiple times. She has been keeping track of what she is eating.  Denies chest tightness.  When she goes into department stores she will have a flare.    Cough This is a new problem. The current episode started more than 1 month ago (reports has been months). The cough is  non-productive (dry cough). Pertinent negatives include no chest pain, chills, fever, nasal congestion, sore throat, sweats or wheezing. There is no history of asthma.     Past Medical History:  Diagnosis Date  . Anxiety   . Blood transfusion without reported diagnosis   . Depression   . Hypertension    no medications at this time  . Hypothyroidism   . Iron deficiency anemia   . Thyroid disease    Past Surgical History:  Procedure Laterality Date  . LAPAROSCOPIC VAGINAL HYSTERECTOMY WITH SALPINGECTOMY Bilateral 10/01/2016   Procedure: LAPAROSCOPIC ASSISTED VAGINAL HYSTERECTOMY WITH SALPINGECTOMY;  Surgeon: Paula Compton, MD;  Location: [facility];  Service: Gynecology;  Laterality: Bilateral;  . WISDOM TOOTH EXTRACTION       Current Meds  Medication Sig  . ALPRAZolam (XANAX XR) 1 MG 24 hr tablet Take 1 mg by mouth at bedtime.  Marland Kitchen escitalopram (LEXAPRO) 10 MG tablet Take 1 tablet (10 mg total) by mouth daily.  Marland Kitchen ibuprofen (ADVIL,MOTRIN) 600 MG tablet Take 1 tablet (600 mg total) by mouth every 6 (six) hours as needed (mild pain).  Marland Kitchen ipratropium (ATROVENT) 0.06 % nasal spray Place 2 sprays into the nose 3 (three) times daily.  Marland Kitchen levothyroxine (SYNTHROID) 150 MCG tablet TAKE 1 TABLET(175 MCG) BY MOUTH DAILY before breakfast  . Vitamin D, Ergocalciferol, (DRISDOL) 1.25 MG (50000 UNIT) CAPS capsule TAKE 1 CAPSULE BY MOUTH 2 TIMES A WEEK     Allergies:   Latex and Penicillins   Social History   Tobacco  Use  . Smoking status: Never Smoker  . Smokeless tobacco: Never Used  Substance Use Topics  . Alcohol use: No  . Drug use: No     Family Hx: The patient's family history includes Diabetes in her father and another family member; Heart disease in an other family member; Hypertension in her mother and another family member; Thyroid disease in her mother and another family member.  ROS:   Please see the history of present illness.    Review of Systems  Constitutional: Negative for  chills and fever.  HENT: Negative for sore throat.   Respiratory: Positive for cough. Negative for wheezing.   Cardiovascular: Negative for chest pain.  Neurological: Negative for dizziness and tingling.  Psychiatric/Behavioral: Negative.     All other systems reviewed and are negative.   Labs/Other Tests and Data Reviewed:    Recent Labs: 02/14/2020: TSH 0.33   Recent Lipid Panel Lab Results  Component Value Date/Time   CHOL 240 (H) 05/31/2019 04:46 PM   TRIG 163 (H) 05/31/2019 04:46 PM   HDL 56 05/31/2019 04:46 PM   CHOLHDL 4.3 05/31/2019 04:46 PM   LDLCALC 155 (H) 05/31/2019 04:46 PM    Wt Readings from Last 3 Encounters:  03/13/20 176 lb 3.2 oz (79.9 kg)  02/14/20 175 lb 6.4 oz (79.6 kg)  11/28/19 173 lb 3.2 oz (78.6 kg)     Exam:    Vital Signs:  LMP 08/28/2016 (Exact Date) Comment: come back on 09/15/2016    Physical Exam Vitals reviewed.  Pulmonary:     Effort: Pulmonary effort is normal. No respiratory distress.  Psychiatric:        Mood and Affect: Mood normal.        Behavior: Behavior normal.        Thought Content: Thought content normal.        Judgment: Judgment normal.     ASSESSMENT & PLAN:    1. Cough  Will treat with atrovent as needed  She will come to have her allergy testing done due to the nature of her cough - ipratropium (ATROVENT) 0.06 % nasal spray; Place 2 sprays into the nose 3 (three) times daily.  Dispense: 15 mL; Refill: 2 - Allergens(46)Foods; Future - Allergens Zone 2; Future   COVID-19 Education: The signs and symptoms of COVID-19 were discussed with the patient and how to seek care for testing (follow up with PCP or arrange E-visit).  The importance of social distancing was discussed today.  Patient Risk:   After full review of this patients clinical status, I feel that they are at least moderate risk at this time.  Time:   Today, I have spent 10 minutes/ seconds with the patient with telehealth technology discussing  above diagnoses.     Medication Adjustments/Labs and Tests Ordered: Current medicines are reviewed at length with the patient today.  Concerns regarding medicines are outlined above.   Tests Ordered: Orders Placed This Encounter  Procedures  . Allergens(46)Foods  . Allergens Zone 2    Medication Changes: Meds ordered this encounter  Medications  . ipratropium (ATROVENT) 0.06 % nasal spray    Sig: Place 2 sprays into the nose 3 (three) times daily.    Dispense:  15 mL    Refill:  2    Disposition:  Follow up prn  Signed, [name], FNP

## 2020-06-21 ENCOUNTER — Other Ambulatory Visit: Payer: Self-pay

## 2020-07-05 ENCOUNTER — Encounter: Payer: Self-pay | Admitting: Nurse Practitioner

## 2020-07-23 ENCOUNTER — Other Ambulatory Visit: Payer: Self-pay | Admitting: Nurse Practitioner

## 2020-07-23 DIAGNOSIS — R059 Cough, unspecified: Secondary | ICD-10-CM

## 2020-09-04 ENCOUNTER — Telehealth: Payer: Self-pay

## 2020-09-04 NOTE — Telephone Encounter (Signed)
Pt LVM for refills called pt LVM to call the office for an appt and what refills she needs

## 2020-09-11 ENCOUNTER — Telehealth: Payer: Self-pay | Admitting: Nurse Practitioner

## 2020-09-11 NOTE — Telephone Encounter (Signed)
Called pt back - no answer left VM

## 2020-09-25 ENCOUNTER — Encounter: Payer: Self-pay | Admitting: Nurse Practitioner

## 2020-09-25 ENCOUNTER — Ambulatory Visit: Payer: BC Managed Care – PPO | Admitting: Nurse Practitioner

## 2020-09-25 ENCOUNTER — Other Ambulatory Visit: Payer: Self-pay

## 2020-09-25 VITALS — BP 120/74 | HR 56 | Temp 98.2°F | Ht 67.0 in | Wt 182.2 lb

## 2020-09-25 DIAGNOSIS — E039 Hypothyroidism, unspecified: Secondary | ICD-10-CM

## 2020-09-25 DIAGNOSIS — E782 Mixed hyperlipidemia: Secondary | ICD-10-CM | POA: Diagnosis not present

## 2020-09-25 DIAGNOSIS — R7303 Prediabetes: Secondary | ICD-10-CM | POA: Diagnosis not present

## 2020-09-25 DIAGNOSIS — E213 Hyperparathyroidism, unspecified: Secondary | ICD-10-CM

## 2020-09-25 DIAGNOSIS — E559 Vitamin D deficiency, unspecified: Secondary | ICD-10-CM

## 2020-09-25 NOTE — Progress Notes (Signed)
I,Yamilka [employer] as a Education administrator for [employer], FNP.,have documented all relevant documentation on the behalf of [name], FNP,as directed by  [name], FNP while in the presence of [name], [place]. This visit occurred during the SARS-CoV-2 public health emergency.  Safety protocols were in place, including screening questions prior to the visit, additional usage of staff PPE, and extensive cleaning of exam room while observing appropriate contact time as indicated for disinfecting solutions.  Subjective:     Patient ID: Amanda Henry , female    DOB: May 11, 1966 , 55 y.o.   MRN: [id number]   Chief Complaint  Patient presents with  . Hypothyroidism    HPI  Patient presents today for thyroid f/u. She missed her appt with Dr. Renne Crigler in January. She has not been for her mammogram and needs to call to schedule.   Wt Readings from Last 3 Encounters: 09/25/20 : 182 lb 3.2 oz (82.6 kg) 03/13/20 : 176 lb 3.2 oz (79.9 kg) 02/14/20 : 175 lb 6.4 oz (79.6 kg)   Hyperlipidemia This is a chronic problem. The current episode started more than 1 year ago. She has no history of chronic renal disease. Pertinent negatives include no chest pain. The current treatment provides moderate improvement of lipids. Risk factors for coronary artery disease include a sedentary lifestyle.  Thyroid Problem Presents for follow-up visit. Symptoms include weight loss. Patient reports no fatigue, nail problem or palpitations. The symptoms have been stable. Her past medical history is significant for hyperlipidemia.     Past Medical History:  Diagnosis Date  . Anxiety   . Blood transfusion without reported diagnosis   . Depression   . Hypertension    no medications at this time  . Hypothyroidism   . Iron deficiency anemia   . Thyroid disease      Family History  Problem Relation Age of Onset  . Thyroid disease Mother   . Hypertension Mother   . Diabetes Father   . Hypertension Other    . Diabetes Other   . Thyroid disease Other   . Heart disease Other      Current Outpatient Medications:  .  ALPRAZolam (XANAX XR) 1 MG 24 hr tablet, Take 1 mg by mouth at bedtime., Disp: , Rfl:  .  cetirizine (ZYRTEC) 10 MG tablet, Take 10 mg by mouth every evening., Disp: , Rfl:  .  escitalopram (LEXAPRO) 10 MG tablet, Take 1 tablet (10 mg total) by mouth daily., Disp: 30 tablet, Rfl: 1 .  ibuprofen (ADVIL,MOTRIN) 600 MG tablet, Take 1 tablet (600 mg total) by mouth every 6 (six) hours as needed (mild pain)., Disp: 30 tablet, Rfl: 0 .  ipratropium (ATROVENT) 0.06 % nasal spray, USE 2 SPRAYS IN EACH NOSTRIL THREE TIMES DAILY, Disp: 15 mL, Rfl: 2 .  Vitamin D, Ergocalciferol, (DRISDOL) 1.25 MG (50000 UNIT) CAPS capsule, TAKE 1 CAPSULE BY MOUTH 2 TIMES A WEEK, Disp: 24 capsule, Rfl: 1 .  levothyroxine (SYNTHROID) 150 MCG tablet, TAKE 1 TABLET (150 MCG) BY MOUTH DAILY before breakfast, Disp: 45 tablet, Rfl: 1   Allergies  Allergen Reactions  . Latex Rash  . Penicillins Rash    Has patient had a PCN reaction causing immediate rash, facial/tongue/throat swelling, SOB or lightheadedness with hypotension: Yes Has patient had a PCN reaction causing severe rash involving mucus membranes or skin necrosis: No Has patient had a PCN reaction that required hospitalization No Has patient had a PCN reaction occurring within the last 10 years:  No If all of the above answers are "NO", then may proceed with Cephalosporin use.      Review of Systems  Constitutional: Positive for weight loss. Negative for fatigue.  Respiratory: Negative.   Cardiovascular: Negative.  Negative for chest pain, palpitations and leg swelling.  Endocrine: Negative for polydipsia, polyphagia and polyuria.  Musculoskeletal: Negative.   Neurological: Negative for dizziness and headaches.  Psychiatric/Behavioral: Negative.      Today's Vitals   09/25/20 1620  BP: 120/74  Pulse: (!) 56  Temp: 98.2 F (36.8 C)  TempSrc:  Oral  Weight: 182 lb 3.2 oz (82.6 kg)  Height: 5\' 7"  (1.702 m)  PainSc: 0-No pain   Body mass index is 28.54 kg/m.   Objective:  Physical Exam Vitals reviewed.  Constitutional:      General: She is not in acute distress.    Appearance: Normal appearance.  Cardiovascular:     Rate and Rhythm: Normal rate and regular rhythm.     Pulses: Normal pulses.     Heart sounds: Normal heart sounds. No murmur heard.   Pulmonary:     Effort: Pulmonary effort is normal. No respiratory distress.     Breath sounds: Normal breath sounds. No wheezing.  Abdominal:     General: Abdomen is flat. Bowel sounds are normal. There is no distension.     Palpations: Abdomen is soft.     Tenderness: There is no abdominal tenderness.  Musculoskeletal:     Right lower leg: No swelling, deformity or tenderness.     Left lower leg: No swelling or deformity. Tenderness: left knee with generalized tenderness.  Skin:    General: Skin is warm and dry.     Capillary Refill: Capillary refill takes less than 2 seconds.  Neurological:     General: No focal deficit present.     Mental Status: She is alert and oriented to person, place, and time.     Cranial Nerves: No cranial nerve deficit.     Motor: No weakness.  Psychiatric:        Mood and Affect: Mood normal.        Behavior: Behavior normal.        Thought Content: Thought content normal.        Judgment: Judgment normal.         Assessment And Plan:     1. Acquired hypothyroidism  Chronic, she has been seeing Dr. Renne Crigler but reports she has not seen them because she missed an appt. I have encouraged her to call and reschedule to have further evaluation - TSH - T4 - T3, free  2. Prediabetes  Chronic, stable  Continue with focusing on healthy diet  3. Mixed hyperlipidemia  Chronic  Avoid fried and fatty foods  4. Vitamin D deficiency  Will check vitamin D level and supplement as needed.     Also encouraged to spend 15 minutes in the  sun daily.  - VITAMIN D 25 Hydroxy (Vit-D Deficiency, Fractures)  5. Hyperparathyroidism ([place]) Chronic, she is to follow up with Dr. Renne Crigler    Patient was given opportunity to ask questions. Patient verbalized understanding of the plan and was able to repeat key elements of the plan. All questions were answered to their satisfaction.  [name], FNP   I, [name], FNP, have reviewed all documentation for this visit. The documentation on 09/25/20 for the exam, diagnosis, procedures, and orders are all accurate and complete.   IF YOU HAVE BEEN REFERRED TO  A SPECIALIST, IT MAY TAKE 1-2 WEEKS TO SCHEDULE/PROCESS THE REFERRAL. IF YOU HAVE NOT HEARD FROM US/SPECIALIST IN TWO WEEKS, PLEASE GIVE [place] A CALL AT [phone].   THE PATIENT IS ENCOURAGED TO PRACTICE SOCIAL DISTANCING DUE TO THE COVID-19 PANDEMIC.

## 2020-09-25 NOTE — Patient Instructions (Signed)
Dr. Renne Crigler (endocrinology) [address] [place], [place] [address] [phone]

## 2020-09-26 LAB — T3, FREE: T3, Free: 1.4 pg/mL — ABNORMAL LOW (ref 2.0–4.4)

## 2020-09-26 LAB — T4: T4, Total: 3 ug/dL — ABNORMAL LOW (ref 4.5–12.0)

## 2020-09-26 LAB — VITAMIN D 25 HYDROXY (VIT D DEFICIENCY, FRACTURES): Vit D, 25-Hydroxy: 17.2 ng/mL — ABNORMAL LOW (ref 30.0–100.0)

## 2020-09-26 LAB — TSH: TSH: 14.8 u[IU]/mL — ABNORMAL HIGH (ref 0.450–4.500)

## 2020-09-27 ENCOUNTER — Telehealth: Payer: Self-pay

## 2020-09-27 DIAGNOSIS — E039 Hypothyroidism, unspecified: Secondary | ICD-10-CM

## 2020-09-27 MED ORDER — LEVOTHYROXINE SODIUM 150 MCG PO TABS: ORAL_TABLET | ORAL | 1 refills | Status: DC

## 2020-09-27 NOTE — Telephone Encounter (Addendum)
Called and spoke with pt who advised she has been out of her medication for a week or so. She is taking the 150 mcg of Levothyroxine. Rx sent to pharmacy.

## 2020-09-27 NOTE — Addendum Note (Signed)
Addended by: Lauralyn Primes on: 09/27/2020 04:37 PM   Modules accepted: Orders

## 2020-09-27 NOTE — Telephone Encounter (Signed)
Pt calling to f/u from last message.

## 2020-09-27 NOTE — Telephone Encounter (Signed)
If she already has appointment in August, then we only need labs: TSH and free T4 in 1.5 mo to make sure they normalize.

## 2020-09-27 NOTE — Telephone Encounter (Signed)
-----   Message from Philemon Kingdom, MD sent at 09/27/2020  1:10 PM EDT ----- Can you please call pt.:  I just received the results from her PCP and the TSH is very high at 14.8, increased from 0.33 in 01/2020.  At that time, we decreased her levothyroxine dose slightly to 150 mcg daily.  Please make sure that she is taking it every day, consistently and she will need to have an appointment in approximately 1.5 months.  At that time, I would like to repeat her thyroid tests, but I would not suggest a change in levothyroxine dose at the moment.

## 2020-09-28 NOTE — Telephone Encounter (Signed)
Can we get her scheduled for a lab appt in 6 weeks.

## 2020-10-01 ENCOUNTER — Other Ambulatory Visit: Payer: Self-pay | Admitting: Family

## 2020-10-19 ENCOUNTER — Encounter: Payer: Self-pay | Admitting: Internal Medicine

## 2020-10-19 ENCOUNTER — Other Ambulatory Visit: Payer: Self-pay

## 2020-10-19 ENCOUNTER — Ambulatory Visit (INDEPENDENT_AMBULATORY_CARE_PROVIDER_SITE_OTHER): Payer: Self-pay | Admitting: Internal Medicine

## 2020-10-19 VITALS — BP 150/100 | HR 70 | Ht 67.0 in | Wt 185.8 lb

## 2020-10-19 DIAGNOSIS — E213 Hyperparathyroidism, unspecified: Secondary | ICD-10-CM

## 2020-10-19 DIAGNOSIS — E039 Hypothyroidism, unspecified: Secondary | ICD-10-CM

## 2020-10-19 DIAGNOSIS — E559 Vitamin D deficiency, unspecified: Secondary | ICD-10-CM

## 2020-10-19 MED ORDER — LEVOTHYROXINE SODIUM 150 MCG PO TABS: ORAL_TABLET | ORAL | 1 refills | Status: DC

## 2020-10-19 NOTE — Progress Notes (Addendum)
Patient ID: Amanda Henry, female   DOB: 01/26/1966, 55 y.o.   MRN: [id number]   This visit occurred during the SARS-CoV-2 public health emergency.  Safety protocols were in place, including screening questions prior to the visit, additional usage of staff PPE, and extensive cleaning of exam room while observing appropriate contact time as indicated for disinfecting solutions.   HPI  Amanda Henry is a 55 y.o.-year-old female, initially referred referred by her PCP, [name], FNP, returning for f/u for hypercalcemia/hyperparathyroidism and also hypothyroidism. Last OV 9 mo ago.  Interim history: At this visit, she complains of weight gain and fatigue.  She is happy that the school is over and she hopes to get some rest over the summer.  Pt was dx with hypercalcemia in 2018 and hyperparathyroidism in 05/2019. I reviewed pt's pertinent labs: Lab Results  Component Value Date   PTH 163 (H) 06/14/2019   CALCIUM 14.5 (HH) 05/31/2019   CALCIUM 10.5 (H) 10/02/2016   CALCIUM 10.3 09/18/2016   CALCIUM 10.2 09/10/2016   CALCIUM 9.7 08/28/2016   CALCIUM 10.5 (H) 05/28/2016   CALCIUM 10.4 04/21/2016   CALCIUM 10.1 02/14/2016   No h/o osteoporosis.  No DXA scans available for review.  No fractures or falls.   No h/o kidney stones.  No h/o CKD. Last BUN/Cr: Lab Results  Component Value Date   BUN 9 05/31/2019   BUN 8 10/02/2016   CREATININE 0.77 05/31/2019   CREATININE 0.69 10/02/2016   Of note, her alkaline phosphatase was elevated, but at that time she also had a low vitamin D: Lab Results  Component Value Date   ALKPHOS 186 (H) 05/31/2019   SPEP was normal: Component     Latest Ref Rng & Units 05/31/2019 06/14/2019  Total Protein     6.0 - 8.5 g/dL 7.3 7.3  Albumin ELP     2.9 - 4.4 g/dL 4.0 3.9  Alpha 1     0.0 - 0.4 g/dL 0.2 0.2  Alpha 2     0.4 - 1.0 g/dL 0.6 0.7  Beta     0.7 - 1.3 g/dL 1.0 1.1  Gamma Globulin     0.4 - 1.8 g/dL 1.5 1.5  M-SPIKE, %     Not  Observed g/dL Not Observed Not Observed  Globulin, Total     2.2 - 3.9 g/dL 3.3 3.4  A/G Ratio     0.7 - 1.7 1.2 1.1   Pt is not on HCTZ.  Vitamin D deficiency:  Reviewed vit D levels: Lab Results  Component Value Date   VD25OH 17.2 (L) 09/25/2020   VD25OH 28.3 (L) 02/14/2020   VD25OH 23.3 (L) 11/28/2019   VD25OH 12.4 (L) 05/31/2019   Pt is on ergocalciferol 50,000 units twice a week but she was missing many doses before last vitamin D level.  Now taking it more consistently.  Pt does not have a FH of hypercalcemia, pituitary tumors, thyroid cancer, or osteoporosis.   Hypothyroidism:  - s/p RAI tx for Graves 1991  She continues on levothyroxine 150 mcg daily (decreased from 175 mg daily in 01/2020): - missed doses - in am - fasting - at least 30 min from b'fast - no Ca, Fe - + MVI - >4h later -added since last visit - no PPIs  - stopped Biotin  Reviewed her TFTs (she did not return for labs 1.5 months after the change in dose): Lab Results  Component Value Date   TSH 14.800 (H) 09/25/2020  TSH 0.33 (L) 02/14/2020   TSH 0.219 (L) 11/28/2019   TSH 0.622 05/31/2019   TSH 1.900 10/21/2018   TSH 6.510 (H) 09/07/2018   TSH 25.010 (H) 06/03/2018   Lab Results  Component Value Date   FREET4 1.12 02/14/2020   FREET4 1.17 09/07/2018   FREET4 0.41 (L) 06/03/2018   Lab Results  Component Value Date   T3FREE 1.4 (L) 09/25/2020   T3FREE 2.7 11/28/2019   T3FREE 2.7 05/31/2019   T3FREE 2.6 10/21/2018   Pt denies: - feeling nodules in neck - hoarseness - dysphagia - choking - SOB with lying down  She has family history of thyroid disease in M and M uncles.  No Fh of thyroid cancer. + h/o radiation tx to head or neck - in 1991 (RAI for Graves).  No herbal supplements. No recent steroids use.   ROS: + See HPI Constitutional: + weight gain/no weight loss, + fatigue, no subjective hyperthermia, no subjective hypothermia Eyes: no blurry vision, no  xerophthalmia ENT: no sore throat, + see HPI Cardiovascular: no CP/no SOB/no palpitations/no leg swelling Respiratory: no cough/no SOB/no wheezing Gastrointestinal: no N/no V/no D/no C/no acid reflux Musculoskeletal: no muscle aches/no joint aches Skin: no rashes, no hair loss Neurological: no tremors/no numbness/no tingling/no dizziness  I reviewed pt's medications, allergies, PMH, social hx, family hx, and changes were documented in the history of present illness. Otherwise, unchanged from my initial visit note.  Past Medical History:  Diagnosis Date   Anxiety    Blood transfusion without reported diagnosis    Depression    Hypertension    no medications at this time   Hypothyroidism    Iron deficiency anemia    Thyroid disease    Past Surgical History:  Procedure Laterality Date   LAPAROSCOPIC VAGINAL HYSTERECTOMY WITH SALPINGECTOMY Bilateral 10/01/2016   Procedure: LAPAROSCOPIC ASSISTED VAGINAL HYSTERECTOMY WITH SALPINGECTOMY;  Surgeon: Paula Compton, MD;  Location: [facility];  Service: Gynecology;  Laterality: Bilateral;   WISDOM TOOTH EXTRACTION     Social History   Socioeconomic History   Marital status: Single    Spouse name: Not on file   Number of children: 1   Years of education: Not on file   Highest education level: Not on file  Occupational History   Occupation: Financial planner  Tobacco Use   Smoking status: Never Smoker   Smokeless tobacco: Never Used  Substance and Sexual Activity   Alcohol use: No   Drug use: No   Sexual activity: Yes    Birth control/protection: Condom  Other Topics Concern   Not on file  Social History Narrative   Not on file   Social Determinants of Health   Financial Resource Strain: Not on file  Food Insecurity: Not on file  Transportation Needs: Not on file  Physical Activity: Not on file  Stress: Not on file  Social Connections: Not on file  Intimate Partner Violence: Not on file   Current Outpatient  Medications on File Prior to Visit  Medication Sig Dispense Refill   ALPRAZolam (XANAX XR) 1 MG 24 hr tablet Take 1 mg by mouth at bedtime.     cetirizine (ZYRTEC) 10 MG tablet Take 10 mg by mouth every evening.     escitalopram (LEXAPRO) 10 MG tablet Take 1 tablet (10 mg total) by mouth daily. 30 tablet 1   ibuprofen (ADVIL,MOTRIN) 600 MG tablet Take 1 tablet (600 mg total) by mouth every 6 (six) hours as needed (mild pain). 30 tablet  0   ipratropium (ATROVENT) 0.06 % nasal spray USE 2 SPRAYS IN EACH NOSTRIL THREE TIMES DAILY 15 mL 2   levothyroxine (SYNTHROID) 150 MCG tablet TAKE 1 TABLET(175 MCG) BY MOUTH DAILY before breakfast 45 tablet 1   Vitamin D, Ergocalciferol, (DRISDOL) 1.25 MG (50000 UNIT) CAPS capsule TAKE 1 CAPSULE BY MOUTH 2 TIMES A WEEK 24 capsule 1   No current facility-administered medications on file prior to visit.   Allergies  Allergen Reactions   Latex Rash   Penicillins Rash    Has patient had a PCN reaction causing immediate rash, facial/tongue/throat swelling, SOB or lightheadedness with hypotension: Yes Has patient had a PCN reaction causing severe rash involving mucus membranes or skin necrosis: No Has patient had a PCN reaction that required hospitalization No Has patient had a PCN reaction occurring within the last 10 years: No If all of the above answers are "NO", then may proceed with Cephalosporin use.    Family History  Problem Relation Age of Onset   Thyroid disease Mother    Hypertension Mother    Diabetes Father    Hypertension Other    Diabetes Other    Thyroid disease Other    Heart disease Other    PE: BP (!) 150/100 (BP Location: Right Arm, Patient Position: Sitting, Cuff Size: Normal)   Pulse 70   Ht _0  (1.702 m)   Wt 185 lb 12.8 oz (84.3 kg)   LMP 08/28/2016 (Exact Date) Comment: come back on 09/15/2016  SpO2 97%   BMI 29.10 kg/m  Wt Readings from Last 3 Encounters:  10/19/20 185 lb 12.8 oz (84.3 kg)  09/25/20 182 lb 3.2 oz (82.6  kg)  03/13/20 176 lb 3.2 oz (79.9 kg)   Constitutional: overweight, in NAD. No kyphosis. Eyes: PERRLA, EOMI, + bilateral symmetric exophthalmos, no lid lag ENT: moist mucous membranes, no thyromegaly, no cervical lymphadenopathy Cardiovascular: RRR, No MRG Respiratory: CTA B Gastrointestinal: abdomen soft, NT, ND, BS+ Musculoskeletal: no deformities, strength intact in all 4 Skin: moist, warm, no rashes Neurological: no tremor with outstretched hands, DTR normal in all 4  Assessment: 1. Hypercalcemia/hyperparathyroidism  2.  Acquired Hypothyroidism  3.  Vitamin D deficiency  Plan: Patient has had several instances of elevated calcium, with a very high calcium level, at 14.5 in 05/2019. An intact PTH level obtained 2 weeks later was high, at 163. -She also has vitamin D deficiency with a lowest level being 12.4, but improved to 28.2 at last visit.  However, since then, she had another vitamin D level that was again very low, at 17.2. -No apparent complications from hypercalcemia: No history of nephrolithiasis, no osteoporosis, no fractures, no abdominal pain, bone pain.  She does have constipation. -At last visit, we discussed about possible etiologies of elevated calcium and PTH (primary and secondary hyperparathyroidism causes). -We discussed that we initially need to normalize her vitamin D to be able to investigate for these problems.  At last visit vitamin D was improved, close to target, so I ordered the rest of the investigation for hyperparathyroidism.  She did not return for this tests... -Since her vitamin D level was still low last month, I will have her come back for a repeat in 1.5 months after starting to take the medication consistently and then, if vitamin D level is normal at that time, will check: calcium level intact PTH ([facility]) Magnesium Phosphorus vitamin D 1,25 HO 24h urinary calcium/creatinine ratio -We again discussed about possible consequences of  hyperparathyroidism:  Osteoporosis, kidney stones etc.  -If the tests remain abnormal and points towards primary hyperparathyroidism, she agrees with a referral to surgery -Criteria for parathyroid surgery or: Increased calcium by more than 1 mg/dL above the upper limit of normal  Kidney ds.  Osteoporosis (or vertebral fracture) Age <53 years old Newer criteria (2013): High UCa >400 mg/d and increased stone risk by biochemical stone risk analysis Presence of nephrolithiasis or nephrocalcinosis Pt's preference!  - I will see the patient back in 4 months  2.  Acquired hypothyroidism - latest thyroid labs reviewed with pt. >> TSH elevated: Lab Results  Component Value Date   TSH 14.800 (H) 09/25/2020  - she continues on LT4 150 mcg daily (we decreased the dose due to her suppressed TSH in 01/2020) - we discussed about taking the thyroid hormone every day, with water, >30 minutes before breakfast, separated by >4 hours from acid reflux medications, calcium, iron, multivitamins. Pt. is taking it correctly, but has missed doses.  She mentions that she started taking more consistently after the above results returned.  We discussed about the absolute importance of taking it every day.  - will check thyroid tests in 1.5 months: TSH and fT4, and we may need to increase the dose back if they are still abnormal - If labs are abnormal, she will need to return for repeat TFTs in 1.5 months  3.  Vitamin D deficiency -Vitamin D levels were as low as 12.4 in the past -She is on ergocalciferol 50,000 units twice a week, but missing doses.  She prefers to stay on the higher dose. -At last visit, vitamin D level was better, but still suboptimal, at 28.3.  I advised her to take the ergocalciferol consistently. -However, a repeat level obtained last month was still very low, at 17.2, due to noncompliance with the vitamin D supplement -At this visit, we discussed that she absolutely needs to take the medication  as prescribed and explained the consequences of low vitamin D -we will not change the regimen for now -We will recheck the vitamin D 1.5 months.  If this returns normal, we can proceed with investigation for hyperparathyroidism.  Orders Placed This Encounter  Procedures   VITAMIN D 25 Hydroxy (Vit-D Deficiency, Fractures)   T4, free   TSH   Component     Latest Ref Rng & Units 10/19/2020  TSH     0.450 - 4.500 uIU/mL 2.090  T4,Free(Direct)     0.82 - 1.77 ng/dL 1.38  Vitamin D, 25-Hydroxy     30.0 - 100.0 ng/mL 24.6 (L)  Vitamin D still low but better >> will have her take the Ergocalciferol consistently and recheck the level in 1.5 mo along with the rest of the tests to investigate the Parathyroid status.She will also need to be given a urine jug when she comes for labs.  Addendum (12/04/2020): Component     Latest Ref Rng & Units 11/28/2020  Glucose     65 - 99 mg/dL 118 (H)  BUN     7 - 25 mg/dL 11  Creatinine     0.50 - 1.03 mg/dL 0.85  eGFR     > OR = 60 mL/min/1.46m 82  BUN/Creatinine Ratio     6 - 22 (calc) NOT APPLICABLE  Sodium     1272- 146 mmol/L 140  Potassium     3.5 - 5.3 mmol/L 3.8  Chloride     98 - 110 mmol/L 104  CO2  20 - 32 mmol/L 27  Calcium     8.6 - 10.4 mg/dL 14.4 (HH)  Vitamin D 1, 25 (OH) Total     18 - 72 pg/mL 127 (H)  Vitamin D3 1, 25 (OH)     pg/mL 19  Vitamin D2 1, 25 (OH)     pg/mL 108  Vitamin D, 25-Hydroxy     30.0 - 100.0 ng/mL 32.6  PTH, Intact     15 - 65 pg/mL 163 (H)  Phosphorus     2.3 - 4.6 mg/dL 1.6 (L)  Magnesium     1.5 - 2.5 mg/dL 2.0   Component     Latest Ref Rng & Units 12/03/2020  Creatinine, 24H Ur     0.50 - 2.15 g/24 h 1.43  Calcium, 24H Urine     mg/24 h 390 (H)  Hypercalcemia, hyperparathyroidism, hypercalciuria, hypophosphatemia, also high calcitriol level.  FECa 0.216%  Will refer to surgery.  Philemon Kingdom, MD PhD [facility]

## 2020-10-19 NOTE — Patient Instructions (Addendum)
Please come back for labs in 1.5 months.  Continue vitamin D 50,000 units 2x a week.  Stop Multivitamin.  Continue Levothyroxine 150 mcg daily.  Take the thyroid hormone every day, with water, at least 30 minutes before breakfast, separated by at least 4 hours from: - acid reflux medications - calcium - iron - multivitamins  Please come back for a follow-up appointment in 4 months.

## 2020-10-20 LAB — T4, FREE: Free T4: 1.38 ng/dL (ref 0.82–1.77)

## 2020-10-20 LAB — TSH: TSH: 2.09 u[IU]/mL (ref 0.450–4.500)

## 2020-10-20 LAB — VITAMIN D 25 HYDROXY (VIT D DEFICIENCY, FRACTURES): Vit D, 25-Hydroxy: 24.6 ng/mL — ABNORMAL LOW (ref 30.0–100.0)

## 2020-10-21 NOTE — Addendum Note (Signed)
Addended by: Philemon Kingdom on: 10/21/2020 05:17 PM   Modules accepted: Orders

## 2020-10-22 ENCOUNTER — Other Ambulatory Visit: Payer: Self-pay

## 2020-10-22 ENCOUNTER — Encounter: Payer: Self-pay | Admitting: Nurse Practitioner

## 2020-10-24 ENCOUNTER — Ambulatory Visit: Payer: Self-pay

## 2020-10-26 ENCOUNTER — Encounter: Payer: Self-pay | Admitting: Family

## 2020-10-27 ENCOUNTER — Ambulatory Visit: Payer: Self-pay

## 2020-11-27 ENCOUNTER — Other Ambulatory Visit: Payer: Self-pay | Admitting: Internal Medicine

## 2020-11-28 ENCOUNTER — Other Ambulatory Visit: Payer: Self-pay

## 2020-11-28 ENCOUNTER — Other Ambulatory Visit (INDEPENDENT_AMBULATORY_CARE_PROVIDER_SITE_OTHER): Payer: BC Managed Care – PPO

## 2020-11-28 DIAGNOSIS — E213 Hyperparathyroidism, unspecified: Secondary | ICD-10-CM

## 2020-11-28 DIAGNOSIS — E559 Vitamin D deficiency, unspecified: Secondary | ICD-10-CM

## 2020-11-28 LAB — MAGNESIUM: Magnesium: 2 mg/dL (ref 1.5–2.5)

## 2020-11-28 LAB — PHOSPHORUS: Phosphorus: 1.6 mg/dL — ABNORMAL LOW (ref 2.3–4.6)

## 2020-11-29 LAB — VITAMIN D 25 HYDROXY (VIT D DEFICIENCY, FRACTURES): Vit D, 25-Hydroxy: 32.6 ng/mL (ref 30.0–100.0)

## 2020-11-29 LAB — PARATHYROID HORMONE, INTACT (NO CA): PTH: 163 pg/mL — ABNORMAL HIGH (ref 15–65)

## 2020-12-03 ENCOUNTER — Other Ambulatory Visit: Payer: BC Managed Care – PPO

## 2020-12-03 ENCOUNTER — Other Ambulatory Visit: Payer: Self-pay

## 2020-12-03 DIAGNOSIS — E213 Hyperparathyroidism, unspecified: Secondary | ICD-10-CM

## 2020-12-03 LAB — VITAMIN D 1,25 DIHYDROXY
Vitamin D 1, 25 (OH)2 Total: 127 pg/mL — ABNORMAL HIGH (ref 18–72)
Vitamin D2 1, 25 (OH)2: 108 pg/mL
Vitamin D3 1, 25 (OH)2: 19 pg/mL

## 2020-12-03 LAB — BASIC METABOLIC PANEL WITH GFR
BUN: 11 mg/dL (ref 7–25)
CO2: 27 mmol/L (ref 20–32)
Calcium: 14.4 mg/dL (ref 8.6–10.4)
Chloride: 104 mmol/L (ref 98–110)
Creat: 0.85 mg/dL (ref 0.50–1.03)
Glucose, Bld: 118 mg/dL — ABNORMAL HIGH (ref 65–99)
Potassium: 3.8 mmol/L (ref 3.5–5.3)
Sodium: 140 mmol/L (ref 135–146)
eGFR: 82 mL/min/{1.73_m2} (ref >=60)

## 2020-12-04 LAB — CREATININE, URINE, 24 HOUR: Creatinine, 24H Ur: 1.43 g/(24.h) (ref 0.50–2.15)

## 2020-12-04 LAB — EXTRA URINE SPECIMEN

## 2020-12-04 LAB — CALCIUM, URINE, 24 HOUR: Calcium, 24H Urine: 390 mg/24 h — ABNORMAL HIGH

## 2020-12-04 NOTE — Addendum Note (Signed)
Addended by: Philemon Kingdom on: 12/04/2020 12:05 PM   Modules accepted: Orders

## 2020-12-13 ENCOUNTER — Telehealth: Payer: Self-pay | Admitting: Internal Medicine

## 2020-12-13 NOTE — Telephone Encounter (Signed)
Can you give her [facility] number for her to follow up.

## 2020-12-13 NOTE — Telephone Encounter (Signed)
Patient said she got a missed call from Dr Synthia Innocent nurse (I don't see anywhere where we called) but she asked if she could be called back at [phone] - did not specify why. And patient said [facility] has not called her yet to schedule so I am sending her a Pharmacist, community message with their number for her to call and f/u (fyi).

## 2020-12-26 ENCOUNTER — Ambulatory Visit: Payer: BC Managed Care – PPO | Admitting: Nurse Practitioner

## 2020-12-26 ENCOUNTER — Other Ambulatory Visit: Payer: Self-pay

## 2020-12-26 ENCOUNTER — Encounter: Payer: Self-pay | Admitting: Nurse Practitioner

## 2020-12-26 ENCOUNTER — Encounter: Payer: Self-pay | Admitting: Internal Medicine

## 2020-12-26 VITALS — BP 128/72 | HR 80 | Temp 98.5°F | Ht 68.6 in | Wt 183.6 lb

## 2020-12-26 DIAGNOSIS — N946 Dysmenorrhea, unspecified: Secondary | ICD-10-CM | POA: Insufficient documentation

## 2020-12-26 DIAGNOSIS — E663 Overweight: Secondary | ICD-10-CM

## 2020-12-26 DIAGNOSIS — K5904 Chronic idiopathic constipation: Secondary | ICD-10-CM | POA: Insufficient documentation

## 2020-12-26 DIAGNOSIS — M545 Low back pain, unspecified: Secondary | ICD-10-CM

## 2020-12-26 DIAGNOSIS — R1033 Periumbilical pain: Secondary | ICD-10-CM | POA: Insufficient documentation

## 2020-12-26 DIAGNOSIS — G8929 Other chronic pain: Secondary | ICD-10-CM | POA: Diagnosis not present

## 2020-12-26 DIAGNOSIS — E782 Mixed hyperlipidemia: Secondary | ICD-10-CM

## 2020-12-26 DIAGNOSIS — F3289 Other specified depressive episodes: Secondary | ICD-10-CM

## 2020-12-26 DIAGNOSIS — Z1231 Encounter for screening mammogram for malignant neoplasm of breast: Secondary | ICD-10-CM

## 2020-12-26 DIAGNOSIS — Z6827 Body mass index (BMI) 27.0-27.9, adult: Secondary | ICD-10-CM

## 2020-12-26 DIAGNOSIS — E039 Hypothyroidism, unspecified: Secondary | ICD-10-CM | POA: Diagnosis not present

## 2020-12-26 DIAGNOSIS — K5730 Diverticulosis of large intestine without perforation or abscess without bleeding: Secondary | ICD-10-CM | POA: Insufficient documentation

## 2020-12-26 MED ORDER — DICLOFENAC SODIUM 1 % EX GEL: 2.0000 g | Freq: Four times a day (QID) | CUTANEOUS | 2 refills | Status: DC

## 2020-12-26 NOTE — Progress Notes (Signed)
I,Amanda Henry,acting as a Education administrator for [employer], FNP.,have documented all relevant documentation on the behalf of Amanda Brine, FNP,as directed by  Amanda Brine, FNP while in the presence of Amanda Henry, Amanda Henry.  This visit occurred during the SARS-CoV-2 public health emergency.  Safety protocols were in place, including screening questions prior to the visit, additional usage of staff PPE, and extensive cleaning of exam room while observing appropriate contact time as indicated for disinfecting solutions.  Subjective:     Patient ID: Amanda Henry , female    DOB: 11/08/1965 , 55 y.o.   MRN: [id number]   Chief Complaint  Patient presents with   Thyroid Problem    HPI  Patient presents today for thyroid f/u.   She has seen a naturalist for her parathyroid, Dr. Renne Henry is recommending for her to call the surgeon for possible parathyroid removal. He has done accupuncture. She was advised to change her diet.   She is seeing Dr. Toy Henry for her behavioral health  Thyroid Problem Presents for follow-up visit. Patient reports no fatigue, nail problem or palpitations. The symptoms have been stable. Her past medical history is significant for hyperlipidemia.  Hyperlipidemia This is a chronic problem. The current episode started more than 1 year ago. She has no history of chronic renal disease. Pertinent negatives include no chest pain. The current treatment provides moderate improvement of lipids. Risk factors for coronary artery disease include a sedentary lifestyle.    Past Medical History:  Diagnosis Date   Anxiety    Blood transfusion without reported diagnosis    Depression    Hypertension    no medications at this time   Hypothyroidism    Iron deficiency anemia    Thyroid disease      Family History  Problem Relation Age of Onset   Thyroid disease Mother    Hypertension Mother    Diabetes Father    Hypertension Other    Diabetes Other    Thyroid disease Other    Heart  disease Other      Current Outpatient Medications:    diclofenac Sodium (VOLTAREN) 1 % GEL, Apply 2 g topically 4 (four) times daily., Disp: 100 g, Rfl: 2   ALPRAZolam (XANAX XR) 1 MG 24 hr tablet, Take 1 mg by mouth at bedtime., Disp: , Rfl:    cetirizine (ZYRTEC) 10 MG tablet, Take 10 mg by mouth every evening., Disp: , Rfl:    escitalopram (LEXAPRO) 10 MG tablet, Take 1 tablet (10 mg total) by mouth daily., Disp: 30 tablet, Rfl: 1   ibuprofen (ADVIL,MOTRIN) 600 MG tablet, Take 1 tablet (600 mg total) by mouth every 6 (six) hours as needed (mild pain)., Disp: 30 tablet, Rfl: 0   ipratropium (ATROVENT) 0.06 % nasal spray, USE 2 SPRAYS IN EACH NOSTRIL THREE TIMES DAILY, Disp: 15 mL, Rfl: 2   levothyroxine (SYNTHROID) 150 MCG tablet, TAKE 1 TABLET (150 MCG) BY MOUTH DAILY before breakfast, Disp: 45 tablet, Rfl: 1   Vitamin D, Ergocalciferol, (DRISDOL) 1.25 MG (50000 UNIT) CAPS capsule, TAKE 1 CAPSULE BY MOUTH 2 TIMES A WEEK, Disp: 24 capsule, Rfl: 1   Allergies  Allergen Reactions   Latex Rash   Penicillins Rash    Has patient had a PCN reaction causing immediate rash, facial/tongue/throat swelling, SOB or lightheadedness with hypotension: Yes Has patient had a PCN reaction causing severe rash involving mucus membranes or skin necrosis: No Has patient had a PCN reaction that required hospitalization No Has patient had a  PCN reaction occurring within the last 10 years: No If all of the above answers are "NO", then may proceed with Cephalosporin use.      Review of Systems  Constitutional: Negative.  Negative for fatigue.  Respiratory: Negative.    Cardiovascular: Negative.  Negative for chest pain, palpitations and leg swelling.  Gastrointestinal: Negative.   Neurological: Negative.   Psychiatric/Behavioral: Negative.      Today's Vitals   12/26/20 1104  BP: 128/72  Pulse: 80  Temp: 98.5 F (36.9 C)  TempSrc: Oral  Weight: 183 lb 9.6 oz (83.3 kg)  Height: 5' 8.6" (1.742 m)    Body mass index is 27.43 kg/m.  Wt Readings from Last 3 Encounters:  12/26/20 183 lb 9.6 oz (83.3 kg)  10/19/20 185 lb 12.8 oz (84.3 kg)  09/25/20 182 lb 3.2 oz (82.6 kg)    Objective:  Physical Exam Vitals reviewed.  Constitutional:      General: She is not in acute distress.    Appearance: Normal appearance.  Cardiovascular:     Rate and Rhythm: Normal rate and regular rhythm.     Pulses: Normal pulses.     Heart sounds: Normal heart sounds. No murmur heard. Pulmonary:     Effort: Pulmonary effort is normal. No respiratory distress.     Breath sounds: Normal breath sounds. No wheezing.  Musculoskeletal:        General: Tenderness (left back pain on palpation) present.  Skin:    General: Skin is warm and dry.     Capillary Refill: Capillary refill takes less than 2 seconds.  Neurological:     General: No focal deficit present.     Mental Status: She is alert and oriented to person, place, and time.     Cranial Nerves: No cranial nerve deficit.     Motor: No weakness.  Psychiatric:        Mood and Affect: Mood normal.        Behavior: Behavior normal.        Thought Content: Thought content normal.        Judgment: Judgment normal.        Assessment And Plan:     1. Acquired hypothyroidism Comments: Continue follow up with Dr. Renne Henry Discussed with her if Dr Amanda Henry sent for surgical consult to at least go to see her options.  2. Mixed hyperlipidemia Comments: Stable, continue current medications  3. Overweight with body mass index (BMI) of 27 to 27.9 in adult  She is encouraged to strive for BMI less than 30 to decrease cardiac risk. Advised to aim for at least 150 minutes of exercise per week.  4. Chronic left-sided low back pain without sciatica Comments: she has tenderness to low back area, likely muscular. Will check hip xray as well - DG Hip Unilat W OR W/O Pelvis Min 4 Views Left; Future  5. Other depression Comments: She is to follow up with her  Psychiatrist to inform her depression screening score 17  6. Encounter for screening mammogram for malignant neoplasm of breast Pt instructed on Self Breast Exam.According to ACOG guidelines Women aged 6 and older are recommended to get an annual mammogram. Form completed and given to patient contact the [facility] for appointment scheduing.  Pt encouraged to get annual mammogram - MM DIGITAL SCREENING BILATERAL; Future    Patient was given opportunity to ask questions. Patient verbalized understanding of the plan and was able to repeat key elements of the plan. All  questions were answered to their satisfaction.  Amanda Brine, FNP   I, Amanda Brine, FNP, have reviewed all documentation for this visit. The documentation on 12/26/20 for the exam, diagnosis, procedures, and orders are all accurate and complete.   IF YOU HAVE BEEN REFERRED TO A SPECIALIST, IT MAY TAKE 1-2 WEEKS TO SCHEDULE/PROCESS THE REFERRAL. IF YOU HAVE NOT HEARD FROM US/SPECIALIST IN TWO WEEKS, PLEASE GIVE [place] A CALL AT [phone].   THE PATIENT IS ENCOURAGED TO PRACTICE SOCIAL DISTANCING DUE TO THE COVID-19 PANDEMIC.

## 2020-12-26 NOTE — Patient Instructions (Addendum)
Hypothyroidism  Hypothyroidism is when the thyroid gland does not make enough of certain hormones (it is underactive). The thyroid gland is a small gland located in the lower front part of the neck, just in front of the windpipe (trachea). This gland makes hormones that help control how the body uses food for energy (metabolism) as well as how the heart and brain function. These hormones also play a role in keeping your bones strong. When the thyroid is underactive, it produces too little of the hormones thyroxine (T4) and triiodothyronine (T3). What are the causes? This condition may be caused by: Hashimoto's disease. This is a disease in which the body's disease-fighting system (immune system) attacks the thyroid gland. This is the most common cause. Viral infections. Pregnancy. Certain medicines. Birth defects. Past radiation treatments to the head or neck for cancer. Past treatment with radioactive iodine. Past exposure to radiation in the environment. Past surgical removal of part or all of the thyroid. Problems with a gland in the center of the brain (pituitary gland). Lack of enough iodine in the diet. What increases the risk? You are more likely to develop this condition if: You are female. You have a family history of thyroid conditions. You use a medicine called lithium. You take medicines that affect the immune system (immunosuppressants). What are the signs or symptoms? Symptoms of this condition include: Feeling as though you have no energy (lethargy). Not being able to tolerate cold. Weight gain that is not explained by a change in diet or exercise habits. Lack of appetite. Dry skin. Coarse hair. Menstrual irregularity. Slowing of thought processes. Constipation. Sadness or depression. How is this diagnosed? This condition may be diagnosed based on: Your symptoms, your medical history, and a physical exam. Blood tests. You may also have imaging tests, such as an  ultrasound or MRI. How is this treated? This condition is treated with medicine that replaces the thyroid hormones that your body does not make. After you begin treatment, it may take several weeksfor symptoms to go away. Follow these instructions at home: Take over-the-counter and prescription medicines only as told by your health care provider. If you start taking any new medicines, tell your health care provider. Keep all follow-up visits as told by your health care provider. This is important. As your condition improves, your dosage of thyroid hormone medicine may change. You will need to have blood tests regularly so that your health care provider can monitor your condition. Contact a health care provider if: Your symptoms do not get better with treatment. You are taking thyroid hormone replacement medicine and you: Sweat a lot. Have tremors. Feel anxious. Lose weight rapidly. Cannot tolerate heat. Have emotional swings. Have diarrhea. Feel weak. Get help right away if you have: Chest pain. An irregular heartbeat. A rapid heartbeat. Difficulty breathing. Summary Hypothyroidism is when the thyroid gland does not make enough of certain hormones (it is underactive). When the thyroid is underactive, it produces too little of the hormones thyroxine (T4) and triiodothyronine (T3). The most common cause is Hashimoto's disease, a disease in which the body's disease-fighting system (immune system) attacks the thyroid gland. The condition can also be caused by viral infections, medicine, pregnancy, or past radiation treatment to the head or neck. Symptoms may include weight gain, dry skin, constipation, feeling as though you do not have energy, and not being able to tolerate cold. This condition is treated with medicine to replace the thyroid hormones that your body does not make. This information   is not intended to replace advice given to you by your health care provider. Make sure you  discuss any questions you have with your healthcare provider. Document Revised: 02/03/2020 Document Reviewed: 01/19/2020 Elsevier Patient Education  2022 [place].  Sciatica Rehab Ask your health care provider which exercises are safe for you. Do exercises exactly as told by your health care provider and adjust them as directed. It is normal to feel mild stretching, pulling, tightness, or discomfort as you do these exercises. Stop right away if you feel sudden pain or your pain gets worse. Do not begin these exercises until told by your health care provider. Stretching and range-of-motion exercises These exercises warm up your muscles and joints and improve the movement and flexibility of your hips and back. These exercises also help to relieve pain,numbness, and tingling. Sciatic nerve glide Sit in a chair with your head facing down toward your chest. Place your hands behind your back. Let your shoulders slump forward. Slowly straighten one of your legs while you tilt your head back as if you are looking toward the ceiling. Only straighten your leg as far as you can without making your symptoms worse. Hold this position for __________ seconds. Slowly return to the starting position. Repeat with your other leg. Repeat __________ times. Complete this exercise __________ times a day. Knee to chest with hip adduction and internal rotation  Lie on your back on a firm surface with both legs straight. Bend one of your knees and move it up toward your chest until you feel a gentle stretch in your lower back and buttock. Then, move your knee toward the shoulder that is on the opposite side from your leg. This is hip adduction and internal rotation. Hold your leg in this position by holding on to the front of your knee. Hold this position for __________ seconds. Slowly return to the starting position. Repeat with your other leg. Repeat __________ times. Complete this exercise __________ times a  day. Prone extension on elbows  Lie on your abdomen on a firm surface. A bed may be too soft for this exercise. Prop yourself up on your elbows. Use your arms to help lift your chest up until you feel a gentle stretch in your abdomen and your lower back. This will place some of your body weight on your elbows. If this is uncomfortable, try stacking pillows under your chest. Your hips should stay down, against the surface that you are lying on. Keep your hip and back muscles relaxed. Hold this position for __________ seconds. Slowly relax your upper body and return to the starting position. Repeat __________ times. Complete this exercise __________ times a day. Strengthening exercises These exercises build strength and endurance in your back. Endurance is theability to use your muscles for a long time, even after they get tired. Pelvic tilt This exercise strengthens the muscles that lie deep in the abdomen. Lie on your back on a firm surface. Bend your knees and keep your feet flat on the floor. Tense your abdominal muscles. Tip your pelvis up toward the ceiling and flatten your lower back into the floor. To help with this exercise, you may place a small towel under your lower back and try to push your back into the towel. Hold this position for __________ seconds. Let your muscles relax completely before you repeat this exercise. Repeat __________ times. Complete this exercise __________ times a day. Alternating arm and leg raises  Get on your hands and knees on  a firm surface. If you are on a hard floor, you may want to use padding, such as an exercise mat, to cushion your knees. Line up your arms and legs. Your hands should be directly below your shoulders, and your knees should be directly below your hips. Lift your left leg behind you. At the same time, raise your right arm and straighten it in front of you. Do not lift your leg higher than your hip. Do not lift your arm higher than  your shoulder. Keep your abdominal and back muscles tight. Keep your hips facing the ground. Do not arch your back. Keep your balance carefully, and do not hold your breath. Hold this position for __________ seconds. Slowly return to the starting position. Repeat with your right leg and your left arm. Repeat __________ times. Complete this exercise __________ times a day. Posture and body mechanics Good posture and healthy body mechanics can help to relieve stress in your body's tissues and joints. Body mechanics refers to the movements and positions of your body while you do your daily activities. Posture is part of body mechanics. Good posture means: Your spine is in its natural S-curve position (neutral). Your shoulders are pulled back slightly. Your head is not tipped forward. Follow these guidelines to improve your posture and body mechanics in youreveryday activities. Standing  When standing, keep your spine neutral and your feet about hip width apart. Keep a slight bend in your knees. Your ears, shoulders, and hips should line up. When you do a task in which you stand in one place for a long time, place one foot up on a stable object that is 2-4 inches (5-10 cm) high, such as a footstool. This helps keep your spine neutral.  Sitting  When sitting, keep your spine neutral and keep your feet flat on the floor. Use a footrest, if necessary, and keep your thighs parallel to the floor. Avoid rounding your shoulders, and avoid tilting your head forward. When working at a desk or a computer, keep your desk at a height where your hands are slightly lower than your elbows. Slide your chair under your desk so you are close enough to maintain good posture. When working at a computer, place your monitor at a height where you are looking straight ahead and you do not have to tilt your head forward or downward to look at the screen.  Resting When lying down and resting, avoid positions that are  most painful for you. If you have pain with activities such as sitting, bending, stooping, or squatting, lie in a position in which your body does not bend very much. For example, avoid curling up on your side with your arms and knees near your chest (fetal position). If you have pain with activities such as standing for a long time or reaching with your arms, lie with your spine in a neutral position and bend your knees slightly. Try the following positions: Lying on your side with a pillow between your knees. Lying on your back with a pillow under your knees. Lifting  When lifting objects, keep your feet at least shoulder width apart and tighten your abdominal muscles. Bend your knees and hips and keep your spine neutral. It is important to lift using the strength of your legs, not your back. Do not lock your knees straight out. Always ask for help to lift heavy or awkward objects.  This information is not intended to replace advice given to you by your health  care provider. Make sure you discuss any questions you have with your healthcare provider. Document Revised: 08/27/2018 Document Reviewed: 05/27/2018 Elsevier Patient Education  [place].

## 2020-12-31 ENCOUNTER — Other Ambulatory Visit: Payer: Self-pay

## 2020-12-31 ENCOUNTER — Other Ambulatory Visit: Payer: Self-pay | Admitting: Internal Medicine

## 2020-12-31 ENCOUNTER — Encounter: Payer: Self-pay | Admitting: Internal Medicine

## 2020-12-31 DIAGNOSIS — E039 Hypothyroidism, unspecified: Secondary | ICD-10-CM

## 2020-12-31 DIAGNOSIS — E559 Vitamin D deficiency, unspecified: Secondary | ICD-10-CM

## 2020-12-31 MED ORDER — VITAMIN D (ERGOCALCIFEROL) 1.25 MG (50000 UNIT) PO CAPS: ORAL_CAPSULE | ORAL | 1 refills | Status: DC

## 2020-12-31 MED ORDER — LEVOTHYROXINE SODIUM 150 MCG PO TABS: ORAL_TABLET | ORAL | 1 refills | Status: DC

## 2021-01-09 ENCOUNTER — Ambulatory Visit: Payer: Self-pay | Admitting: Internal Medicine

## 2021-02-18 ENCOUNTER — Ambulatory Visit: Payer: Self-pay | Admitting: Internal Medicine

## 2021-02-18 NOTE — Progress Notes (Deleted)
Patient ID: Amanda Henry, female   DOB: [birth year], 55 y.o.   MRN: [id number]   This visit occurred during the SARS-CoV-2 public health emergency.  Safety protocols were in place, including screening questions prior to the visit, additional usage of staff PPE, and extensive cleaning of exam room while observing appropriate contact time as indicated for disinfecting solutions.   HPI  Amanda Henry is a 55 y.o.-year-old female, initially referred referred by her PCP, Amanda Brine, FNP, returning for f/u for hypercalcemia/hyperparathyroidism and also hypothyroidism. Last OV 4 months ago.  Interim history: Since last visit, we establish the diagnosis of primary hyperparathyroidism, which is quite significant. I referred her to surgery and it was a emergent basis at last visit but she did not have this yet.  She saw an acupuncturist/naturalist who recommended a change in diet.  Pt was dx with hypercalcemia in 2018 and hyperparathyroidism in 05/2019. I reviewed pt's pertinent labs: Lab Results  Component Value Date   PTH 163 (H) 11/28/2020   PTH 163 (H) 06/14/2019   CALCIUM 14.4 (HH) 11/28/2020   CALCIUM 14.5 (HH) 05/31/2019   CALCIUM 10.5 (H) 10/02/2016   CALCIUM 10.3 09/18/2016   CALCIUM 10.2 09/10/2016   CALCIUM 9.7 08/28/2016   CALCIUM 10.5 (H) 05/28/2016   CALCIUM 10.4 04/21/2016   CALCIUM 10.1 02/14/2016   No h/o osteoporosis.  No DXA scans available for review.  No fractures or falls.   No h/o kidney stones.  No h/o CKD. Last BUN/Cr: Lab Results  Component Value Date   BUN 11 11/28/2020   BUN 9 05/31/2019   CREATININE 0.85 11/28/2020   CREATININE 0.77 05/31/2019   Further investigation showed an elevated calcitriol, low phosphorus, normal magnesium: Component     Latest Ref Rng & Units 11/28/2020  BUN     7 - 25 mg/dL 11  Creatinine     0.50 - 1.03 mg/dL 0.85  eGFR     > OR = 60 mL/min/1.9m 82  Calcium     8.6 - 10.4 mg/dL 14.4 (HH)  Vitamin D 1, 25 (OH)  Total     18 - 72 pg/mL 127 (H)  Vitamin D3 1, 25 (OH)     pg/mL 19  Vitamin D2 1, 25 (OH)     pg/mL 108  Vitamin D, 25-Hydroxy     30.0 - 100.0 ng/mL 32.6  Phosphorus     2.3 - 4.6 mg/dL 1.6 (L)  Magnesium     1.5 - 2.5 mg/dL 2.0   24-hour urine calcium was elevated: Component     Latest Ref Rng & Units 12/03/2020  Creatinine, 24H Ur     0.50 - 2.15 g/24 h 1.43  Calcium, 24H Urine     mg/24 h 390 (H)  FECa 0.216%  Of note, her alkaline phosphatase was elevated, but at that time she also had a low vitamin D: Lab Results  Component Value Date   ALKPHOS 186 (H) 05/31/2019   SPEP was normal: Component     Latest Ref Rng & Units 05/31/2019 06/14/2019  Total Protein     6.0 - 8.5 g/dL 7.3 7.3  Albumin ELP     2.9 - 4.4 g/dL 4.0 3.9  Alpha 1     0.0 - 0.4 g/dL 0.2 0.2  Alpha 2     0.4 - 1.0 g/dL 0.6 0.7  Beta     0.7 - 1.3 g/dL 1.0 1.1  Gamma Globulin     0.4 - 1.8 g/dL 1.5 1.5  M-SPIKE, %     Not Observed g/dL Not Observed Not Observed  Globulin, Total     2.2 - 3.9 g/dL 3.3 3.4  A/G Ratio     0.7 - 1.7 1.2 1.1   Pt is not on HCTZ.  Vitamin D deficiency:  Reviewed vit D levels: Lab Results  Component Value Date   VD25OH 32.6 11/28/2020   VD25OH 24.6 (L) 10/19/2020   VD25OH 17.2 (L) 09/25/2020   VD25OH 28.3 (L) 02/14/2020   VD25OH 23.3 (L) 11/28/2019   VD25OH 12.4 (L) 05/31/2019   Pt is on ergocalciferol 50,000 units twice a week but she was missing many doses before last vitamin D level.  Now taking it more consistently.  Pt does not have a FH of hypercalcemia, pituitary tumors, thyroid cancer, or osteoporosis.   Hypothyroidism:  - s/p RAI tx for Graves 1991  She continues on levothyroxine 150 mcg daily (decreased from 175 mg daily in 01/2020): - missed doses previously, now taking them consistently - in am - fasting - at least 30 min from b'fast - no Ca, Fe - + MVI - >4h later - no PPIs  - stopped Biotin  Reviewed her TFTs: Lab Results   Component Value Date   TSH 2.090 10/19/2020   TSH 14.800 (H) 09/25/2020   TSH 0.33 (L) 02/14/2020   TSH 0.219 (L) 11/28/2019   TSH 0.622 05/31/2019   TSH 1.900 10/21/2018   TSH 6.510 (H) 09/07/2018   TSH 25.010 (H) 06/03/2018   Lab Results  Component Value Date   FREET4 1.38 10/19/2020   FREET4 1.12 02/14/2020   FREET4 1.17 09/07/2018   FREET4 0.41 (L) 06/03/2018   Lab Results  Component Value Date   T3FREE 1.4 (L) 09/25/2020   T3FREE 2.7 11/28/2019   T3FREE 2.7 05/31/2019   T3FREE 2.6 10/21/2018   Pt denies: - feeling nodules in neck - hoarseness - dysphagia - choking - SOB with lying down  She has family history of thyroid disease in M and M uncles.  No Fh of thyroid cancer. + h/o radiation tx to head or neck - in 1991 (RAI for Graves).  No herbal supplements. No recent steroids use.   ROS: + See HPI + Fatigue, no weight gain/loss  I reviewed pt's medications, allergies, PMH, social hx, family hx, and changes were documented in the history of present illness. Otherwise, unchanged from my initial visit note.  Past Medical History:  Diagnosis Date   Anxiety    Blood transfusion without reported diagnosis    Depression    Hypertension    no medications at this time   Hypothyroidism    Iron deficiency anemia    Thyroid disease    Past Surgical History:  Procedure Laterality Date   LAPAROSCOPIC VAGINAL HYSTERECTOMY WITH SALPINGECTOMY Bilateral 10/01/2016   Procedure: LAPAROSCOPIC ASSISTED VAGINAL HYSTERECTOMY WITH SALPINGECTOMY;  Surgeon: Paula Compton, MD;  Location: [facility];  Service: Gynecology;  Laterality: Bilateral;   WISDOM TOOTH EXTRACTION     Social History   Socioeconomic History   Marital status: Single    Spouse name: Not on file   Number of children: 1   Years of education: Not on file   Highest education level: Not on file  Occupational History   Occupation: Elementary school educator  Tobacco Use   Smoking status: Never   Smokeless  tobacco: Never  Substance and Sexual Activity   Alcohol use: No   Drug use: No   Sexual activity: Yes  Birth control/protection: Condom  Other Topics Concern   Not on file  Social History Narrative   Not on file   Social Determinants of Health   Financial Resource Strain: Not on file  Food Insecurity: Not on file  Transportation Needs: Not on file  Physical Activity: Not on file  Stress: Not on file  Social Connections: Not on file  Intimate Partner Violence: Not on file   Current Outpatient Medications on File Prior to Visit  Medication Sig Dispense Refill   ALPRAZolam (XANAX XR) 1 MG 24 hr tablet Take 1 mg by mouth at bedtime.     cetirizine (ZYRTEC) 10 MG tablet Take 10 mg by mouth every evening.     diclofenac Sodium (VOLTAREN) 1 % GEL Apply 2 g topically 4 (four) times daily. 100 g 2   escitalopram (LEXAPRO) 10 MG tablet Take 1 tablet (10 mg total) by mouth daily. 30 tablet 1   ibuprofen (ADVIL,MOTRIN) 600 MG tablet Take 1 tablet (600 mg total) by mouth every 6 (six) hours as needed (mild pain). 30 tablet 0   ipratropium (ATROVENT) 0.06 % nasal spray USE 2 SPRAYS IN EACH NOSTRIL THREE TIMES DAILY 15 mL 2   levothyroxine (SYNTHROID) 150 MCG tablet TAKE 1 TABLET (150 MCG) BY MOUTH DAILY before breakfast 45 tablet 1   Vitamin D, Ergocalciferol, (DRISDOL) 1.25 MG (50000 UNIT) CAPS capsule TAKE 1 CAPSULE BY MOUTH 2 TIMES A WEEK 24 capsule 1   No current facility-administered medications on file prior to visit.   Allergies  Allergen Reactions   Latex Rash   Penicillins Rash    Has patient had a PCN reaction causing immediate rash, facial/tongue/throat swelling, SOB or lightheadedness with hypotension: Yes Has patient had a PCN reaction causing severe rash involving mucus membranes or skin necrosis: No Has patient had a PCN reaction that required hospitalization No Has patient had a PCN reaction occurring within the last 10 years: No If all of the above answers are "NO",  then may proceed with Cephalosporin use.    Family History  Problem Relation Age of Onset   Thyroid disease Mother    Hypertension Mother    Diabetes Father    Hypertension Other    Diabetes Other    Thyroid disease Other    Heart disease Other    PE: LMP 08/28/2016 (Exact Date) Comment: come back on 09/15/2016 Wt Readings from Last 3 Encounters:  12/26/20 183 lb 9.6 oz (83.3 kg)  10/19/20 185 lb 12.8 oz (84.3 kg)  09/25/20 182 lb 3.2 oz (82.6 kg)   Constitutional: overweight, in NAD. No kyphosis. Eyes: PERRLA, EOMI, + bilateral symmetric exophthalmos, no lid lag ENT: moist mucous membranes, no thyromegaly, no cervical lymphadenopathy Cardiovascular: RRR, No MRG Respiratory: CTA B Gastrointestinal: abdomen soft, NT, ND, BS+ Musculoskeletal: no deformities, strength intact in all 4 Skin: moist, warm, no rashes Neurological: no tremor with outstretched hands, DTR normal in all 4  Assessment: 1. Hypercalcemia/hyperparathyroidism  2.  Acquired Hypothyroidism  3.  Vitamin D deficiency  Plan: Patient with severe hypercalcemia and hyperparathyroidism: Highest calcium was 14.5 in 05/2019. An intact PTH level obtained 2 weeks later was high, at 163.  Repeat calcium level on 11/28/2020 was 14.4, with a very high PTH, 163, a low phosphorus at 1.6 and a high calcitriol at 127.  24-hour urine calcium also returned elevated, at 390. -She also has a history of vitamin D deficiency, with the latest vitamin D level normal at 32.6. -At last visit, we  discussed about possible etiologies of elevated calcium and PTH. -We also discussed about possible consequences of severe and prolonged hyperparathyroidism to include osteoporosis and kidney stones. -We confirmed primary hyperparathyroidism based on the above labs and I referred her to surgery.  She did not have the appointment yet as she wanted to work with functional medicine to see if she can decrease the calcium level naturally. -At today's  visit, we discussed about the severity of her condition.  Such high calcium can be associated with the above possible complications but also with acute manifestations like altered mental status, constipation, abdominal pain, other GI symptoms.  Also, we have to make sure that her parathyroid tumor is not malignant.  I strongly recommended to go ahead with surgery. - I will see the patient back in 4 months  2.  Acquired hypothyroidism - latest thyroid labs reviewed with pt. >> normal: Lab Results  Component Value Date   TSH 2.090 10/19/2020  - she continues on LT4 150 mcg daily - pt feels good on this dose. - we discussed about taking the thyroid hormone every day, with water, >30 minutes before breakfast, separated by >4 hours from acid reflux medications, calcium, iron, multivitamins. Pt. is taking it correctly.  3.  Vitamin D deficiency -Vitamin D level was as low as 12.4 in the past -At last visit she was missing ergocalciferol doses-now taking it consistently twice a week -Labs normalized in 11/2020-vitamin D at that time was 35 -We will not repeat a vitamin D level today but I strongly advised him to continue to take the supplements consistently  No orders of the defined types were placed in this encounter.  Philemon Kingdom, MD PhD [facility]

## 2021-03-12 ENCOUNTER — Other Ambulatory Visit (HOSPITAL_BASED_OUTPATIENT_CLINIC_OR_DEPARTMENT_OTHER): Payer: Self-pay | Admitting: Surgery

## 2021-03-12 ENCOUNTER — Other Ambulatory Visit (HOSPITAL_COMMUNITY): Payer: Self-pay | Admitting: Surgery

## 2021-03-12 DIAGNOSIS — E210 Primary hyperparathyroidism: Secondary | ICD-10-CM

## 2021-03-12 DIAGNOSIS — D351 Benign neoplasm of parathyroid gland: Secondary | ICD-10-CM

## 2021-03-15 ENCOUNTER — Other Ambulatory Visit: Payer: Self-pay

## 2021-03-15 ENCOUNTER — Emergency Department (HOSPITAL_COMMUNITY): Payer: BC Managed Care – PPO

## 2021-03-15 ENCOUNTER — Inpatient Hospital Stay (HOSPITAL_COMMUNITY): Payer: BC Managed Care – PPO

## 2021-03-15 ENCOUNTER — Encounter (HOSPITAL_COMMUNITY)
Admission: EM | Disposition: A | Discharge status: Rehabilitation Facility | Payer: Self-pay | Source: Home / Self Care | Attending: Neurosurgery

## 2021-03-15 ENCOUNTER — Inpatient Hospital Stay (HOSPITAL_COMMUNITY): Payer: BC Managed Care – PPO | Admitting: Certified Registered Nurse Anesthetist

## 2021-03-15 ENCOUNTER — Inpatient Hospital Stay (HOSPITAL_COMMUNITY)
Admission: EM | Admit: 2021-03-15 | Discharge: 2021-04-03 | DRG: 021 | Disposition: A | Discharge status: Home / Self Care | Payer: BC Managed Care – PPO | Attending: Internal Medicine | Admitting: Internal Medicine

## 2021-03-15 ENCOUNTER — Encounter (HOSPITAL_COMMUNITY): Payer: Self-pay | Admitting: Emergency Medicine

## 2021-03-15 ENCOUNTER — Other Ambulatory Visit (HOSPITAL_COMMUNITY): Payer: BC Managed Care – PPO

## 2021-03-15 DIAGNOSIS — K5900 Constipation, unspecified: Secondary | ICD-10-CM | POA: Diagnosis not present

## 2021-03-15 DIAGNOSIS — D34 Benign neoplasm of thyroid gland: Secondary | ICD-10-CM | POA: Diagnosis present

## 2021-03-15 DIAGNOSIS — I606 Nontraumatic subarachnoid hemorrhage from other intracranial arteries: Secondary | ICD-10-CM | POA: Diagnosis present

## 2021-03-15 DIAGNOSIS — Z91040 Latex allergy status: Secondary | ICD-10-CM | POA: Diagnosis not present

## 2021-03-15 DIAGNOSIS — Z20822 Contact with and (suspected) exposure to covid-19: Secondary | ICD-10-CM | POA: Diagnosis present

## 2021-03-15 DIAGNOSIS — R2689 Other abnormalities of gait and mobility: Secondary | ICD-10-CM | POA: Diagnosis not present

## 2021-03-15 DIAGNOSIS — Z880 Allergy status to penicillin: Secondary | ICD-10-CM | POA: Diagnosis not present

## 2021-03-15 DIAGNOSIS — E039 Hypothyroidism, unspecified: Secondary | ICD-10-CM | POA: Diagnosis present

## 2021-03-15 DIAGNOSIS — I609 Nontraumatic subarachnoid hemorrhage, unspecified: Secondary | ICD-10-CM | POA: Diagnosis present

## 2021-03-15 DIAGNOSIS — E782 Mixed hyperlipidemia: Secondary | ICD-10-CM | POA: Diagnosis present

## 2021-03-15 DIAGNOSIS — R7303 Prediabetes: Secondary | ICD-10-CM | POA: Diagnosis present

## 2021-03-15 DIAGNOSIS — E876 Hypokalemia: Secondary | ICD-10-CM | POA: Diagnosis not present

## 2021-03-15 DIAGNOSIS — I10 Essential (primary) hypertension: Secondary | ICD-10-CM | POA: Diagnosis present

## 2021-03-15 DIAGNOSIS — F419 Anxiety disorder, unspecified: Secondary | ICD-10-CM | POA: Diagnosis present

## 2021-03-15 DIAGNOSIS — M791 Myalgia, unspecified site: Secondary | ICD-10-CM | POA: Diagnosis not present

## 2021-03-15 DIAGNOSIS — I69019 Unspecified symptoms and signs involving cognitive functions following nontraumatic subarachnoid hemorrhage: Secondary | ICD-10-CM | POA: Diagnosis not present

## 2021-03-15 DIAGNOSIS — H538 Other visual disturbances: Secondary | ICD-10-CM | POA: Diagnosis not present

## 2021-03-15 DIAGNOSIS — Z79899 Other long term (current) drug therapy: Secondary | ICD-10-CM

## 2021-03-15 DIAGNOSIS — D509 Iron deficiency anemia, unspecified: Secondary | ICD-10-CM | POA: Diagnosis not present

## 2021-03-15 DIAGNOSIS — E210 Primary hyperparathyroidism: Secondary | ICD-10-CM | POA: Diagnosis present

## 2021-03-15 DIAGNOSIS — Z79890 Hormone replacement therapy (postmenopausal): Secondary | ICD-10-CM

## 2021-03-15 DIAGNOSIS — F32A Depression, unspecified: Secondary | ICD-10-CM | POA: Diagnosis not present

## 2021-03-15 DIAGNOSIS — E213 Hyperparathyroidism, unspecified: Secondary | ICD-10-CM | POA: Diagnosis not present

## 2021-03-15 DIAGNOSIS — Z8679 Personal history of other diseases of the circulatory system: Secondary | ICD-10-CM | POA: Diagnosis not present

## 2021-03-15 DIAGNOSIS — G4700 Insomnia, unspecified: Secondary | ICD-10-CM | POA: Diagnosis not present

## 2021-03-15 DIAGNOSIS — Z90710 Acquired absence of both cervix and uterus: Secondary | ICD-10-CM | POA: Diagnosis not present

## 2021-03-15 DIAGNOSIS — G910 Communicating hydrocephalus: Secondary | ICD-10-CM | POA: Diagnosis not present

## 2021-03-15 DIAGNOSIS — I69098 Other sequelae following nontraumatic subarachnoid hemorrhage: Secondary | ICD-10-CM | POA: Diagnosis not present

## 2021-03-15 HISTORY — PX: IR ANGIO INTRA EXTRACRAN SEL INTERNAL CAROTID UNI R MOD SED: IMG5362

## 2021-03-15 HISTORY — PX: IR ANGIO INTRA EXTRACRAN SEL INTERNAL CAROTID BILAT MOD SED: IMG5363

## 2021-03-15 HISTORY — DX: Nontraumatic subarachnoid hemorrhage, unspecified: I60.9

## 2021-03-15 HISTORY — PX: IR ANGIO VERTEBRAL SEL VERTEBRAL UNI L MOD SED: IMG5367

## 2021-03-15 HISTORY — PX: RADIOLOGY WITH ANESTHESIA: SHX6223

## 2021-03-15 LAB — CBC WITH DIFFERENTIAL/PLATELET
Abs Immature Granulocytes: 0 10*3/uL (ref 0.00–0.07)
Basophils Absolute: 0 10*3/uL (ref 0.0–0.1)
Basophils Relative: 0 %
Eosinophils Absolute: 0.1 10*3/uL (ref 0.0–0.5)
Eosinophils Relative: 1 %
HCT: 42.3 % (ref 36.0–46.0)
Hemoglobin: 14 g/dL (ref 12.0–15.0)
Lymphocytes Relative: 60 %
Lymphs Abs: 5.9 10*3/uL — ABNORMAL HIGH (ref 0.7–4.0)
MCH: 29.4 pg (ref 26.0–34.0)
MCHC: 33.1 g/dL (ref 30.0–36.0)
MCV: 88.9 fL (ref 80.0–100.0)
Monocytes Absolute: 0.5 10*3/uL (ref 0.1–1.0)
Monocytes Relative: 5 %
Neutro Abs: 3.3 10*3/uL (ref 1.7–7.7)
Neutrophils Relative %: 34 %
Platelets: 303 10*3/uL (ref 150–400)
RBC: 4.76 MIL/uL (ref 3.87–5.11)
RDW: 13.1 % (ref 11.5–15.5)
WBC: 9.8 10*3/uL (ref 4.0–10.5)
nRBC: 0 % (ref 0.0–0.2)
nRBC: 0 /100 WBC

## 2021-03-15 LAB — RESP PANEL BY RT-PCR (FLU A&B, COVID) ARPGX2
Influenza A by PCR: NEGATIVE
Influenza B by PCR: NEGATIVE
SARS Coronavirus 2 by RT PCR: NEGATIVE

## 2021-03-15 LAB — CBC
HCT: 42.6 % (ref 36.0–46.0)
Hemoglobin: 14 g/dL (ref 12.0–15.0)
MCH: 29.4 pg (ref 26.0–34.0)
MCHC: 32.9 g/dL (ref 30.0–36.0)
MCV: 89.3 fL (ref 80.0–100.0)
Platelets: 306 10*3/uL (ref 150–400)
RBC: 4.77 MIL/uL (ref 3.87–5.11)
RDW: 13.3 % (ref 11.5–15.5)
WBC: 13.1 10*3/uL — ABNORMAL HIGH (ref 4.0–10.5)
nRBC: 0 % (ref 0.0–0.2)

## 2021-03-15 LAB — BASIC METABOLIC PANEL
Anion gap: 9 (ref 5–15)
BUN: 8 mg/dL (ref 6–20)
CO2: 25 mmol/L (ref 22–32)
Calcium: 13.5 mg/dL (ref 8.9–10.3)
Chloride: 106 mmol/L (ref 98–111)
Creatinine, Ser: 0.71 mg/dL (ref 0.44–1.00)
GFR, Estimated: >60 mL/min (ref >60)
Glucose, Bld: 138 mg/dL — ABNORMAL HIGH (ref 70–99)
Potassium: 2.6 mmol/L — CL (ref 3.5–5.1)
Sodium: 140 mmol/L (ref 135–145)

## 2021-03-15 LAB — SURGICAL PCR SCREEN
MRSA, PCR: NEGATIVE
Staphylococcus aureus: NEGATIVE

## 2021-03-15 LAB — APTT: aPTT: 26 seconds (ref 24–36)

## 2021-03-15 LAB — HIV ANTIBODY (ROUTINE TESTING W REFLEX): HIV Screen 4th Generation wRfx: NONREACTIVE

## 2021-03-15 LAB — PROTIME-INR
INR: 1 (ref 0.8–1.2)
Prothrombin Time: 13.1 seconds (ref 11.4–15.2)

## 2021-03-15 LAB — MAGNESIUM: Magnesium: 1.9 mg/dL (ref 1.7–2.4)

## 2021-03-15 SURGERY — IR WITH ANESTHESIA
Anesthesia: General

## 2021-03-15 MED ORDER — FENTANYL CITRATE (PF) 100 MCG/2ML IJ SOLN: 25.0000 ug | INTRAMUSCULAR | Status: DC | PRN

## 2021-03-15 MED ORDER — VITAMIN D (ERGOCALCIFEROL) 1.25 MG (50000 UNIT) PO CAPS
50000.0000 [IU] | ORAL_CAPSULE | ORAL | Status: DC
  Administered 2021-03-18 – 2021-04-01 (×5): 50000 [IU] via ORAL
  Filled 2021-03-15 (×5): qty 1

## 2021-03-15 MED ORDER — SODIUM CHLORIDE 0.9 % IV SOLN: INTRAVENOUS | Status: DC

## 2021-03-15 MED ORDER — FENTANYL CITRATE (PF) 250 MCG/5ML IJ SOLN
INTRAMUSCULAR | Status: DC | PRN
  Administered 2021-03-15 (×2): 50 ug via INTRAVENOUS

## 2021-03-15 MED ORDER — LEVOTHYROXINE SODIUM 75 MCG PO TABS
150.0000 ug | ORAL_TABLET | Freq: Every day | ORAL | Status: DC
  Administered 2021-03-17 – 2021-04-03 (×18): 150 ug via ORAL
  Filled 2021-03-15 (×15): qty 2
  Filled 2021-03-15: qty 6
  Filled 2021-03-15 (×2): qty 2

## 2021-03-15 MED ORDER — FENTANYL CITRATE (PF) 250 MCG/5ML IJ SOLN
INTRAMUSCULAR | Status: AC
  Filled 2021-03-15: qty 5

## 2021-03-15 MED ORDER — MUPIROCIN 2 % EX OINT
1.0000 "application " | TOPICAL_OINTMENT | Freq: Two times a day (BID) | CUTANEOUS | Status: DC
  Administered 2021-03-15: 1 via NASAL
  Filled 2021-03-15: qty 22

## 2021-03-15 MED ORDER — ONDANSETRON HCL 4 MG/2ML IJ SOLN
4.0000 mg | Freq: Four times a day (QID) | INTRAMUSCULAR | Status: DC | PRN
  Administered 2021-03-26: 4 mg via INTRAVENOUS
  Filled 2021-03-15: qty 2

## 2021-03-15 MED ORDER — IPRATROPIUM BROMIDE 0.06 % NA SOLN
2.0000 | Freq: Two times a day (BID) | NASAL | Status: DC | PRN
  Filled 2021-03-15: qty 15

## 2021-03-15 MED ORDER — ACETAMINOPHEN 160 MG/5ML PO SOLN
650.0000 mg | ORAL | Status: DC | PRN
  Administered 2021-04-02: 650 mg
  Filled 2021-03-15: qty 20.3

## 2021-03-15 MED ORDER — ESCITALOPRAM OXALATE 10 MG PO TABS
10.0000 mg | ORAL_TABLET | Freq: Every day | ORAL | Status: DC
  Administered 2021-03-15 – 2021-04-02 (×19): 10 mg via ORAL
  Filled 2021-03-15 (×19): qty 1

## 2021-03-15 MED ORDER — OXYCODONE HCL 5 MG PO TABS
5.0000 mg | ORAL_TABLET | Freq: Once | ORAL | Status: DC | PRN
Start: 2021-03-15 — End: 2021-03-15

## 2021-03-15 MED ORDER — POTASSIUM CHLORIDE 10 MEQ/100ML IV SOLN
10.0000 meq | Freq: Once | INTRAVENOUS | Status: AC
  Administered 2021-03-15: 10 meq via INTRAVENOUS
  Filled 2021-03-15: qty 100

## 2021-03-15 MED ORDER — ONDANSETRON 4 MG PO TBDP
4.0000 mg | ORAL_TABLET | Freq: Four times a day (QID) | ORAL | Status: DC | PRN
  Administered 2021-03-28: 4 mg via ORAL
  Filled 2021-03-15: qty 1

## 2021-03-15 MED ORDER — LIDOCAINE HCL 1 % IJ SOLN
INTRAMUSCULAR | Status: AC
  Filled 2021-03-15: qty 20

## 2021-03-15 MED ORDER — LABETALOL HCL 5 MG/ML IV SOLN
INTRAVENOUS | Status: DC | PRN
  Administered 2021-03-15: 10 mg via INTRAVENOUS

## 2021-03-15 MED ORDER — CHLORHEXIDINE GLUCONATE CLOTH 2 % EX PADS
6.0000 | MEDICATED_PAD | Freq: Every day | CUTANEOUS | Status: DC
  Administered 2021-03-16 – 2021-03-30 (×15): 6 via TOPICAL

## 2021-03-15 MED ORDER — ACETAMINOPHEN 325 MG PO TABS
650.0000 mg | ORAL_TABLET | ORAL | Status: DC | PRN
  Administered 2021-03-17 – 2021-03-31 (×21): 650 mg via ORAL
  Filled 2021-03-15 (×22): qty 2

## 2021-03-15 MED ORDER — PANTOPRAZOLE SODIUM 40 MG PO TBEC
40.0000 mg | DELAYED_RELEASE_TABLET | Freq: Every day | ORAL | Status: DC
  Administered 2021-03-17 – 2021-04-02 (×17): 40 mg via ORAL
  Filled 2021-03-15 (×19): qty 1

## 2021-03-15 MED ORDER — CLEVIDIPINE BUTYRATE 0.5 MG/ML IV EMUL
0.0000 mg/h | INTRAVENOUS | Status: DC
  Administered 2021-03-15: 26 mg/h via INTRAVENOUS
  Administered 2021-03-15: 21 mg/h via INTRAVENOUS
  Administered 2021-03-15: 32 mg/h via INTRAVENOUS
  Administered 2021-03-15: 8 mg/h via INTRAVENOUS
  Administered 2021-03-15: 28 mg/h via INTRAVENOUS
  Administered 2021-03-15: 32 mg/h via INTRAVENOUS
  Administered 2021-03-16: 11 mg/h via INTRAVENOUS
  Administered 2021-03-16: 2 mg/h via INTRAVENOUS
  Filled 2021-03-15 (×2): qty 100
  Filled 2021-03-15: qty 50
  Filled 2021-03-15 (×2): qty 100
  Filled 2021-03-15: qty 50
  Filled 2021-03-15: qty 100
  Filled 2021-03-15: qty 50

## 2021-03-15 MED ORDER — MORPHINE SULFATE (PF) 2 MG/ML IV SOLN
2.0000 mg | INTRAVENOUS | Status: DC | PRN
Start: 2021-03-15 — End: 2021-04-03
  Administered 2021-03-15 – 2021-03-26 (×14): 2 mg via INTRAVENOUS
  Filled 2021-03-15 (×14): qty 1

## 2021-03-15 MED ORDER — POTASSIUM CHLORIDE 10 MEQ/100ML IV SOLN
10.0000 meq | INTRAVENOUS | Status: DC
  Administered 2021-03-15 (×2): 10 meq via INTRAVENOUS
  Filled 2021-03-15 (×2): qty 100

## 2021-03-15 MED ORDER — STROKE: EARLY STAGES OF RECOVERY BOOK
Freq: Once | Status: AC
  Filled 2021-03-15: qty 1

## 2021-03-15 MED ORDER — MORPHINE SULFATE (PF) 4 MG/ML IV SOLN
4.0000 mg | Freq: Once | INTRAVENOUS | Status: AC
  Administered 2021-03-15: 4 mg via INTRAVENOUS
  Filled 2021-03-15: qty 1

## 2021-03-15 MED ORDER — DOCUSATE SODIUM 100 MG PO CAPS
100.0000 mg | ORAL_CAPSULE | Freq: Two times a day (BID) | ORAL | Status: DC
  Administered 2021-03-15 – 2021-04-02 (×30): 100 mg via ORAL
  Filled 2021-03-15 (×33): qty 1

## 2021-03-15 MED ORDER — IOHEXOL 300 MG/ML  SOLN
100.0000 mL | Freq: Once | INTRAMUSCULAR | Status: AC | PRN
  Administered 2021-03-15: 50 mL via INTRA_ARTERIAL

## 2021-03-15 MED ORDER — ACETAMINOPHEN 650 MG RE SUPP: 650.0000 mg | RECTAL | Status: DC | PRN

## 2021-03-15 MED ORDER — ONDANSETRON HCL 4 MG/2ML IJ SOLN
4.0000 mg | Freq: Four times a day (QID) | INTRAMUSCULAR | Status: DC | PRN
Start: 2021-03-15 — End: 2021-03-15

## 2021-03-15 MED ORDER — PANTOPRAZOLE 2 MG/ML SUSPENSION
40.0000 mg | Freq: Every day | ORAL | Status: DC
  Filled 2021-03-15 (×4): qty 20

## 2021-03-15 MED ORDER — IOHEXOL 350 MG/ML SOLN
100.0000 mL | Freq: Once | INTRAVENOUS | Status: AC | PRN
  Administered 2021-03-15: 100 mL via INTRAVENOUS

## 2021-03-15 MED ORDER — NIMODIPINE 30 MG PO CAPS
60.0000 mg | ORAL_CAPSULE | ORAL | Status: DC
  Administered 2021-03-15 – 2021-04-03 (×110): 60 mg via ORAL
  Filled 2021-03-15 (×112): qty 2

## 2021-03-15 MED ORDER — OXYCODONE HCL 5 MG/5ML PO SOLN
5.0000 mg | Freq: Once | ORAL | Status: DC | PRN
Start: 2021-03-15 — End: 2021-03-15

## 2021-03-15 MED ORDER — NIMODIPINE 6 MG/ML PO SOLN
60.0000 mg | ORAL | Status: DC
Start: 2021-03-15 — End: 2021-04-03
  Filled 2021-03-15 (×33): qty 10

## 2021-03-15 MED ORDER — CLEVIDIPINE BUTYRATE 0.5 MG/ML IV EMUL
0.0000 mg/h | INTRAVENOUS | Status: DC
  Administered 2021-03-15: 21 mg/h via INTRAVENOUS
  Administered 2021-03-15: 10 mg/h via INTRAVENOUS
  Administered 2021-03-15: 1 mg/h via INTRAVENOUS
  Filled 2021-03-15 (×3): qty 50

## 2021-03-15 NOTE — Brief Op Note (Signed)
  NEUROSURGERY BRIEF OPERATIVE  NOTE   PREOP DX: Subarachnoid Hemorrhage  POSTOP DX: Same  PROCEDURE: Diagnostic cerebral angiogram  SURGEON: Dr. Consuella Lose, MD  ANESTHESIA: IV Sedation with Local  EBL: Minimal  SPECIMENS: None  COMPLICATIONS: None  CONDITION: Stable to ICU  FINDINGS (Full report in [facility]): 1. Normal cerebral angiogram, no aneurysms, AVM, or fistulas seen.   Consuella Lose, MD [facility]

## 2021-03-15 NOTE — ED Provider Notes (Signed)
7:14 AM Care assumed with Dr. Tyrone Nine.  At time of transfer care, patient is awaiting for neurosurgery to evaluate in the setting of new subarachnoid hemorrhage.  CTA was obtained but is still awaiting official read.  Awaiting neurosurgery recommendations.  Neurosurgery will admit patient and will take for intervention this afternoon.   Clinical Impression: 1. SAH (subarachnoid hemorrhage) (Amanda Henry)     Disposition: Admit  This note was prepared with assistance of Systems analyst. Occasional wrong-word or sound-a-like substitutions may have occurred due to the inherent limitations of voice recognition software.       [name], Gwenyth Allegra, MD 03/15/21 [phone]

## 2021-03-15 NOTE — ED Provider Notes (Signed)
[facility] EMERGENCY DEPARTMENT Provider Note   CSN: [id number] Arrival date & time: 03/15/21  0501     History Chief Complaint  Patient presents with   Migraine    Amanda Henry is a 55 y.o. female.  55  yo F with a chief complaints of a sudden onset worst headache of her life.  Started this morning about 4 AM.  Woke up on the ground when it started.  Not sure how she got there lost control of her bladder.  She is a bit confused and has trouble providing further history level 5 caveat.   Migraine      Past Medical History:  Diagnosis Date   Anxiety    Blood transfusion without reported diagnosis    Depression    Hypertension    no medications at this time   Hypothyroidism    Iron deficiency anemia    Thyroid disease     Patient Active Problem List   Diagnosis Date Noted   Mixed hyperlipidemia 06/03/2018   Prediabetes 06/03/2018   S/P laparoscopic assisted vaginal hysterectomy (LAVH) 10/01/2016   Menorrhagia 09/10/2016   Iron deficiency anemia 05/06/2016   Acquired hypothyroidism 05/24/2013   Depression with anxiety 05/24/2013    Past Surgical History:  Procedure Laterality Date   LAPAROSCOPIC VAGINAL HYSTERECTOMY WITH SALPINGECTOMY Bilateral 10/01/2016   Procedure: LAPAROSCOPIC ASSISTED VAGINAL HYSTERECTOMY WITH SALPINGECTOMY;  Surgeon: Paula Compton, MD;  Location: [facility];  Service: Gynecology;  Laterality: Bilateral;   WISDOM TOOTH EXTRACTION       OB History   No obstetric history on file.     Family History  Problem Relation Age of Onset   Thyroid disease Mother    Hypertension Mother    Diabetes Father    Hypertension Other    Diabetes Other    Thyroid disease Other    Heart disease Other     Social History   Tobacco Use   Smoking status: Never   Smokeless tobacco: Never  Substance Use Topics   Alcohol use: No   Drug use: No    Home Medications Prior to Admission medications   Medication Sig Start Date  End Date Taking? Authorizing Provider  ALPRAZolam (XANAX XR) 1 MG 24 hr tablet Take 1 mg by mouth at bedtime.    [provider]  cetirizine (ZYRTEC) 10 MG tablet Take 10 mg by mouth every evening.    [provider]  diclofenac Sodium (VOLTAREN) 1 % GEL Apply 2 g topically 4 (four) times daily. 12/26/20   [name], FNP  escitalopram (LEXAPRO) 10 MG tablet Take 1 tablet (10 mg total) by mouth daily. 09/06/18   [name], FNP  ibuprofen (ADVIL,MOTRIN) 600 MG tablet Take 1 tablet (600 mg total) by mouth every 6 (six) hours as needed (mild pain). 10/02/16   Paula Compton, MD  ipratropium (ATROVENT) 0.06 % nasal spray USE 2 SPRAYS IN [facility] NOSTRIL THREE TIMES DAILY 07/23/20   [name], FNP  levothyroxine (SYNTHROID) 150 MCG tablet TAKE 1 TABLET (150 MCG) BY MOUTH DAILY before breakfast 12/31/20   [name], FNP  Vitamin D, Ergocalciferol, (DRISDOL) 1.25 MG (50000 UNIT) CAPS capsule TAKE 1 CAPSULE BY MOUTH 2 TIMES A WEEK 12/31/20   Philemon Kingdom, MD    Allergies    Latex and Penicillins  Review of Systems   Review of Systems  Unable to perform ROS: Mental status change   Physical Exam Updated Vital Signs BP (!) 174/80   Pulse 86  Temp 98.2 F (36.8 C) (Oral)   Resp (!) 28   LMP 08/28/2016 (Exact Date) Comment: come back on 09/15/2016  SpO2 99%   Physical Exam Vitals and nursing note reviewed.  Constitutional:      General: She is not in acute distress.    Appearance: She is well-developed. She is not diaphoretic.  HENT:     Head: Normocephalic and atraumatic.  Eyes:     Pupils: Pupils are equal, round, and reactive to light.  Cardiovascular:     Rate and Rhythm: Normal rate and regular rhythm.     Heart sounds: No murmur heard.   No friction rub. No gallop.  Pulmonary:     Effort: Pulmonary effort is normal.     Breath sounds: No wheezing or rales.  Abdominal:     General: There is no distension.     Palpations: Abdomen is soft.      Tenderness: There is no abdominal tenderness.  Musculoskeletal:        General: No tenderness.     Cervical back: Normal range of motion and neck supple.  Skin:    General: Skin is warm and dry.  Neurological:     Mental Status: She is alert.     Comments: Patient has some difficulty understanding is for neuro exam.  Without exception no obvious deficit found on exam.  Psychiatric:        Behavior: Behavior normal.    ED Results / Procedures / Treatments   Labs (all labs ordered are listed, but only abnormal results are displayed) Labs Reviewed  CBC WITH DIFFERENTIAL/PLATELET - Abnormal; Notable for the following components:      Result Value   Lymphs Abs 5.9 (*)    All other components within normal limits  BASIC METABOLIC PANEL - Abnormal; Notable for the following components:   Potassium 2.6 (*)    Glucose, Bld 138 (*)    Calcium 13.5 (*)    All other components within normal limits  RESP PANEL BY RT-PCR (FLU A&B, COVID) [id number]  MAGNESIUM    EKG EKG Interpretation  Date/Time:  Friday March 15 2021 06:49:45 EDT Ventricular Rate:  86 PR Interval:  199 QRS Duration: 111 QT Interval:  468 QTC Calculation: 560 R Axis:   26 Text Interpretation: Sinus rhythm Borderline abnrm T, anterolateral leads Prolonged QT interval Otherwise no significant change Confirmed by Deno Etienne [phone]) on 03/15/2021 6:51:18 AM  Radiology CT HEAD WO CONTRAST (5MM)  Result Date: 03/15/2021 CLINICAL DATA:  Awoke with headache. EXAM: CT HEAD WITHOUT CONTRAST TECHNIQUE: Contiguous axial images were obtained from the base of the skull through the vertex without intravenous contrast. COMPARISON:  02/14/2016 FINDINGS: Brain: Subarachnoid hemorrhage in the basal cisterns and lower sylvian fissures with intraventricular reflux to the level of the foramen Monro. Early ventriculomegaly. A 3 mm parenchymal bleed could be present in the left corona radiata, attention on thin-section follow-up. Vascular: No  hyperdense vessel or unexpected calcification. Skull: Normal. Negative for fracture or focal lesion. Sinuses/Orbits: No acute finding. Other: Critical Value/emergent results were called by telephone at the time of interpretation on 03/15/2021 at 5:45 am to provider [facility] , who verbally acknowledged these results. IMPRESSION: 1. Subarachnoid hemorrhage with aneurysmal pattern.  Recommend CTA. 2. Intraventricular reflux of blood and mild ventriculomegaly. Electronically Signed   By: Jorje Guild M.D.   On: 03/15/2021 05:46    Procedures Procedures   Medications Ordered in ED Medications  clevidipine (CLEVIPREX) infusion 0.5 mg/mL (8  mg/hr Intravenous Rate/Dose Change 03/15/21 0659)  potassium chloride 10 mEq in 100 mL IVPB (has no administration in time range)  morphine 4 MG/ML injection 4 mg (4 mg Intravenous Given 03/15/21 0633)  iohexol (OMNIPAQUE) 350 MG/ML injection 100 mL (100 mLs Intravenous Contrast Given 03/15/21 0646)    ED Course  I have reviewed the triage vital signs and the nursing notes.  Pertinent labs & imaging results that were available during my care of the patient were reviewed by me and considered in my medical decision making (see chart for details).    MDM Rules/Calculators/A&P                           55 yo F with a chief complaint of the sudden onset worst headache of her life.  This happened at 4am. CT found to have a SAH. Will obtain emergent CTA.    CRITICAL CARE Performed by: Cecilio Asper   Total critical care time: 35 minutes  Critical care time was exclusive of separately billable procedures and treating other patients.  Critical care was necessary to treat or prevent imminent or life-threatening deterioration.  Critical care was time spent personally by me on the following activities: development of treatment plan with patient and/or surrogate as well as nursing, discussions with consultants, evaluation of patient's response to  treatment, examination of patient, obtaining history from patient or surrogate, ordering and performing treatments and interventions, ordering and review of laboratory studies, ordering and review of radiographic studies, pulse oximetry and re-evaluation of patient's condition.  The patients results and plan were reviewed and discussed.   Any x-rays performed were independently reviewed by myself.   Differential diagnosis were considered with the presenting HPI.  Medications  clevidipine (CLEVIPREX) infusion 0.5 mg/mL (8 mg/hr Intravenous Rate/Dose Change 03/15/21 0659)  potassium chloride 10 mEq in 100 mL IVPB (has no administration in time range)  morphine 4 MG/ML injection 4 mg (4 mg Intravenous Given 03/15/21 0633)  iohexol (OMNIPAQUE) 350 MG/ML injection 100 mL (100 mLs Intravenous Contrast Given 03/15/21 0646)    Vitals:   03/15/21 0652 03/15/21 0654 03/15/21 0656 03/15/21 0658  BP: (!) 172/77 (!) 176/82 (!) 163/85 (!) 174/80  Pulse: 85 86 87 86  Resp: (!) 25 (!) 29 (!) 29 (!) 28  Temp:      TempSrc:      SpO2: 99% 99% 100% 99%    Final diagnoses:  SAH (subarachnoid hemorrhage) ([place])    Admission/ observation were discussed with the admitting physician, patient and/or family and they are comfortable with the plan.    Final Clinical Impression(s) / ED Diagnoses Final diagnoses:  SAH (subarachnoid hemorrhage) [facility])    Rx / DC Orders ED Discharge Orders     None        Deno Etienne, DO 03/15/21 0701

## 2021-03-15 NOTE — H&P (Signed)
Chief Complaint   Headache  History of Present Illness  Amanda Henry is a 55 y.o. female presenting to the hospital complaining of worst headache of her life upon waking up this morning.  She did not have any complaints upon going to bed last night.  When she woke up this morning she had severe throbbing headache bifrontal and retro-orbital.  She was also complaining of some blurry or double vision although she says it was mostly because she was having trouble opening her eyes with a light.  When they try to get her up she was somewhat unsteady on her feet.  She currently does complain of significant headache and photophobia.  She does not complain of any numbness tingling or weakness of the extremities.  She does not complain of any focal weakness.  Of note, the patient does have a history of hypertension.  No history of diabetes, previous heart attack or stroke.  No history of kidney disease.  She denies use of any antiplatelet or anticoagulation medications.  She is a non-smoker.  No known family history of intracranial aneurysms or unexplained intracranial hemorrhage.  Past Medical History   Past Medical History:  Diagnosis Date   Anxiety    Blood transfusion without reported diagnosis    Depression    Hypertension    no medications at this time   Hypothyroidism    Iron deficiency anemia    Thyroid disease     Past Surgical History   Past Surgical History:  Procedure Laterality Date   LAPAROSCOPIC VAGINAL HYSTERECTOMY WITH SALPINGECTOMY Bilateral 10/01/2016   Procedure: LAPAROSCOPIC ASSISTED VAGINAL HYSTERECTOMY WITH SALPINGECTOMY;  Surgeon: Paula Compton, MD;  Location: [facility];  Service: Gynecology;  Laterality: Bilateral;   WISDOM TOOTH EXTRACTION      Social History   Social History   Tobacco Use   Smoking status: Never   Smokeless tobacco: Never  Substance Use Topics   Alcohol use: No   Drug use: No    Medications   Prior to Admission medications    Medication Sig Start Date End Date Taking? Authorizing Provider  acetaminophen (TYLENOL) 500 MG tablet Take 1,000 mg by mouth every 6 (six) hours as needed for moderate pain or headache.   Yes [provider]  ALPRAZolam (XANAX XR) 1 MG 24 hr tablet Take 1 mg by mouth at bedtime.   Yes [provider]  escitalopram (LEXAPRO) 10 MG tablet Take 1 tablet (10 mg total) by mouth daily. Patient taking differently: Take 10 mg by mouth at bedtime. 09/06/18  Yes [name], FNP  ipratropium (ATROVENT) 0.06 % nasal spray USE 2 SPRAYS IN EACH NOSTRIL THREE TIMES DAILY Patient taking differently: Place 2 sprays into both nostrils 2 (two) times daily as needed for rhinitis. 07/23/20  Yes [name], FNP  levothyroxine (SYNTHROID) 150 MCG tablet TAKE 1 TABLET (150 MCG) BY MOUTH DAILY before breakfast Patient taking differently: Take 150 mcg by mouth daily before breakfast. 12/31/20  Yes [name], FNP  Vitamin D, Ergocalciferol, (DRISDOL) 1.25 MG (50000 UNIT) CAPS capsule TAKE 1 CAPSULE BY MOUTH 2 TIMES A WEEK Patient taking differently: Take 50,000 Units by mouth See admin instructions. Monday and wednesday 12/31/20  Yes Philemon Kingdom, MD  diclofenac Sodium (VOLTAREN) 1 % GEL Apply 2 g topically 4 (four) times daily. Patient not taking: Reported on 03/15/2021 12/26/20   [name], FNP  ibuprofen (ADVIL,MOTRIN) 600 MG tablet Take 1 tablet (600 mg total) by mouth every 6 (six) hours as needed (  mild pain). Patient not taking: Reported on 03/15/2021 10/02/16   Paula Compton, MD    Allergies   Allergies  Allergen Reactions   Latex Rash   Penicillins Rash    Has patient had a PCN reaction causing immediate rash, facial/tongue/throat swelling, SOB or lightheadedness with hypotension: Yes Has patient had a PCN reaction causing severe rash involving mucus membranes or skin necrosis: No Has patient had a PCN reaction that required hospitalization No Has patient had a PCN  reaction occurring within the last 10 years: No If all of the above answers are "NO", then may proceed with Cephalosporin use.     Review of Systems  ROS  Neurologic Exam  Drowsy but easily arouses, oriented Memory and concentration grossly intact Speech fluent, appropriate CN grossly intact Motor exam: Upper Extremities Deltoid Bicep Tricep Grip  Right 5/5 5/5 5/5 5/5  Left 5/5 5/5 5/5 5/5   Lower Extremities IP Quad PF DF EHL  Right 5/5 5/5 5/5 5/5 5/5  Left 5/5 5/5 5/5 5/5 5/5   Sensation grossly intact to LT  Imaging  CT scan of the head was personally reviewed and demonstrates diffuse basal subarachnoid hemorrhage including blood in the perimesencephalic region.  There is mild prominence of the temporal horns without significant ventriculomegaly.  CT angiogram of the head was also personally reviewed.  While the intracranial vasculature appears somewhat attenuated, I question whether there may be a small laterally projecting aneurysm of the anterior choroidal artery on the right side.  Impression  - 55 y.o. female presenting with subarachnoid hemorrhage, Hunt-Hess 2, mod Fisher 3. 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 I explained to them that intracranial aneurysm was the most common non-traumatic cause for [facility] and that the definitive diagnosis is made by diagnostic angiogram. I also explained to them the possible treatment options for intracranial aneurysms including endovascular coiling and open clip ligation. The risks of the angiogram, coiling, and surgical clipping were also reviewed to include stroke and aneurysm re-rupture  leading to weakness/paralysis/coma/death, infection, SZ, hydrocephalus.   The patient and her son appeared to understand our discussion and they provided consent to proceed with diagnostic angiogram and the appropriate treatment for any identified aneurysm. All questions this am were answered.   Consuella Lose, MD [facility]

## 2021-03-15 NOTE — ED Triage Notes (Signed)
Patient here from home, woke up with migraine around 4am.  She does have some photophobia.  She does have a cough, and has neck pain with the cough. Patient is hypertensive.  Denies any nausea or vomiting.

## 2021-03-15 NOTE — ED Notes (Signed)
Pt reports IV site burning from K+ despite dilution with IVF. Slowed rate of K+ for comfort.

## 2021-03-15 NOTE — Transfer of Care (Signed)
Immediate Anesthesia Transfer of Care Note  Patient: [name]  Procedure(s) Performed: IR WITH ANESTHESIA  Patient Location: PACU  Anesthesia Type:MAC  Level of Consciousness: drowsy and patient cooperative  Airway & Oxygen Therapy: Patient Spontanous Breathing  Post-op Assessment: Report given to RN and Post -op Vital signs reviewed and stable  Post vital signs: Reviewed and stable  Last Vitals:  Vitals Value Taken Time  BP    Temp    Pulse 80 03/15/21 1643  Resp 27 03/15/21 1643  SpO2 99 % 03/15/21 1643  Vitals shown include unvalidated device data.  Last Pain:  Vitals:   03/15/21 1231  TempSrc: Oral  PainSc:          Complications: No notable events documented.

## 2021-03-15 NOTE — Sedation Documentation (Signed)
5 Fr Exoseal deployed right groin, holding manual pressure at this time

## 2021-03-15 NOTE — Anesthesia Preprocedure Evaluation (Signed)
Anesthesia Evaluation  Patient identified by MRN, date of birth, ID band Patient awake    Reviewed: Allergy & Precautions, H&P , NPO status , Patient's Chart, lab work & pertinent test results  Airway Mallampati: II   Neck ROM: full    Dental   Pulmonary neg pulmonary ROS,    breath sounds clear to auscultation       Cardiovascular hypertension,  Rhythm:regular Rate:Normal     Neuro/Psych PSYCHIATRIC DISORDERS Anxiety Depression Subarachnoid hemorrhage    GI/Hepatic   Endo/Other  Hypothyroidism   Renal/GU      Musculoskeletal   Abdominal   Peds  Hematology   Anesthesia Other Findings   Reproductive/Obstetrics                             Anesthesia Physical Anesthesia Plan  ASA: 3  Anesthesia Plan: General   Post-op Pain Management:    Induction: Intravenous  PONV Risk Score and Plan: 3 and Ondansetron, Dexamethasone, Midazolam and Treatment may vary due to age or medical condition  Airway Management Planned: Oral ETT  Additional Equipment: Arterial line  Intra-op Plan:   Post-operative Plan: Extubation in OR  Informed Consent: I have reviewed the patients History and Physical, chart, labs and discussed the procedure including the risks, benefits and alternatives for the proposed anesthesia with the patient or authorized representative who has indicated his/her understanding and acceptance.     Dental advisory given  Plan Discussed with: CRNA, Anesthesiologist and Surgeon  Anesthesia Plan Comments:         Anesthesia Quick Evaluation

## 2021-03-15 NOTE — Progress Notes (Signed)
Dr. Kathyrn Sheriff paged due to art line BP sustained > 150. Verbal order given to treat cuff pressures and keep SBP < 160.

## 2021-03-15 NOTE — ED Notes (Signed)
Cleviprex held for BP. Rechecking BP prior to continuing gtt.

## 2021-03-15 NOTE — ED Notes (Signed)
Neurosurg paged to Dr. Sherry Ruffing per his request

## 2021-03-15 NOTE — ED Notes (Signed)
Pt assisted with bed pan

## 2021-03-15 NOTE — Anesthesia Procedure Notes (Signed)
Arterial Line Insertion Start/End10/28/2022 2:20 PM, 03/15/2021 2:45 PM Performed by: Lance Coon, CRNA, CRNA  Preanesthetic checklist: patient identified, IV checked, site marked, risks and benefits discussed, surgical consent, monitors and equipment checked, pre-op evaluation, timeout performed and anesthesia consent Lidocaine 1% used for infiltration Left, radial was placed Catheter size: 20 G Hand hygiene performed  and Seldinger technique used  Procedure performed without using ultrasound guided technique. Following insertion, dressing applied and Biopatch. Post procedure assessment: normal and unchanged  Patient tolerated the procedure well with no immediate complications.

## 2021-03-16 ENCOUNTER — Inpatient Hospital Stay (HOSPITAL_COMMUNITY): Payer: BC Managed Care – PPO

## 2021-03-16 ENCOUNTER — Encounter (HOSPITAL_COMMUNITY): Payer: Self-pay

## 2021-03-16 DIAGNOSIS — I609 Nontraumatic subarachnoid hemorrhage, unspecified: Secondary | ICD-10-CM

## 2021-03-16 HISTORY — PX: IR ANGIO INTRA EXTRACRAN SEL COM CAROTID INNOMINATE UNI L MOD SED: IMG5358

## 2021-03-16 LAB — TRIGLYCERIDES: Triglycerides: 110 mg/dL (ref <150)

## 2021-03-16 MED ORDER — LABETALOL HCL 5 MG/ML IV SOLN
10.0000 mg | INTRAVENOUS | Status: DC | PRN
  Administered 2021-03-16 – 2021-03-27 (×19): 10 mg via INTRAVENOUS
  Filled 2021-03-16 (×15): qty 4

## 2021-03-16 NOTE — Plan of Care (Signed)
  Problem: Nutrition: Goal: Adequate nutrition will be maintained Outcome: Progressing   Problem: Elimination: Goal: Will not experience complications related to urinary retention Outcome: Progressing   

## 2021-03-16 NOTE — Progress Notes (Signed)
OT Cancellation Note  Patient Details Name: Amanda Henry MRN: [id number] DOB: [birth year]   Cancelled Treatment:    Reason Eval/Treat Not Completed: Medical issues which prohibited therapy.  Pt with placement of EVD, and is quite lethargic.  Will reattempt.  Nilsa Nutting., OTR/L Acute Rehabilitation Services Pager [phone] Office [phone]   Lucille Passy M 03/16/2021, 2:18 PM

## 2021-03-16 NOTE — Anesthesia Postprocedure Evaluation (Signed)
Anesthesia Post Note  Patient: [name]  Procedure(s) Performed: IR WITH ANESTHESIA     Patient location during evaluation: PACU Anesthesia Type: General Level of consciousness: awake and alert Pain management: pain level controlled Vital Signs Assessment: post-procedure vital signs reviewed and stable Respiratory status: spontaneous breathing, nonlabored ventilation, respiratory function stable and patient connected to nasal cannula oxygen Cardiovascular status: stable and blood pressure returned to baseline Postop Assessment: no apparent nausea or vomiting Anesthetic complications: no   No notable events documented.  Last Vitals:  Vitals:   03/16/21 0630 03/16/21 0700  BP: (!) 147/72 (!) 154/70  Pulse: 86   Resp: (!) 21 (!) 28  Temp:    SpO2: 97%     Last Pain:  Vitals:   03/16/21 0400  TempSrc: Oral  PainSc: [place]

## 2021-03-16 NOTE — Progress Notes (Signed)
She looks really good today.  She has minimal headache.  She is awake and pleasant and conversant.  She has no focal motor deficit.  No facial asymmetry.  Gaze is conjugate.  She would like to eat and get up and go to the bathroom.  Had a discussion with her regarding her arteriogram negative subarachnoid hemorrhage.  She has demonstrated understanding.  Continue to monitor.  Will need follow-up studies next week.

## 2021-03-16 NOTE — Progress Notes (Signed)
Patient's art line consistently reading high. MD already aware. Patient repositioned and art line reading improved. Upon further repositioning, art line pressure returned to high readings. Will continue to use cuff pressure for medication titration per MD verbal order to previous shift RN.

## 2021-03-16 NOTE — Progress Notes (Signed)
Glenford Peers NP called regarding patient's ability to take PO nimodipine due to sustained drowsiness. Verbal order given to skip 1100 dose of nimodipine and reassess ability to swallow for 1500 dose. If patient still drowsy and unable to pass swallow screen, place NG tube.

## 2021-03-16 NOTE — Progress Notes (Signed)
PT Cancellation Note  Patient Details Name: Amanda Henry MRN: [id number] DOB: 1965/08/07   Cancelled Treatment:    Reason Eval/Treat Not Completed: Medical issues which prohibited therapy EVD recently placed and patient lethargic. RN request to hold at this time. Will check back at later date.   Clydie Dillen A. Gilford Rile PT, DPT Acute Rehabilitation Services Pager [phone] Office [phone]    [name] 03/16/2021, 2:18 PM

## 2021-03-16 NOTE — Progress Notes (Signed)
Transcranial Doppler  Date POD PCO2 HCT BP  MCA ACA PCA OPHT SIPH VERT Basilar  10/29 RH     Right  Left   58  35   -17  *   -31  -17   19  12   20  16    -28  -35   -42           Right  Left                                            Right  Left                                             Right  Left                                             Right  Left                                            Right  Left                                            Right  Left                                        MCA = Middle Cerebral Artery      OPHT = Opthalmic Artery     BASILAR = Basilar Artery   ACA = Anterior Cerebral Artery     SIPH = Carotid Siphon PCA = Posterior Cerebral Artery   VERT = Verterbral Artery                   Normal MCA = 62+\-12 ACA = 50+\-12 PCA = 42+\-23   RT [place] = 3.22 LT Lind[place]ard = 1.59  Darlin Coco, RDMS, RVT

## 2021-03-16 NOTE — Procedures (Signed)
Risks and benefits were discussed with the patient and family at bedside. Consent obtained. Initial insertion site was marked midpupilary line just behind the hairline on the right. Patient was prepped and draped in sterile fashion. 23ml of morphine was given to the patient. Vital signs stable after administration of ativan and patient resting comfortably. 59ml of Lidocaine was used for local anesthetic. Incision with a scalpel was made at the insertion point that was already determined. Hand drill was then used to make a small craniotomy. Dura was felt and then punctured with a needle. Catheter was inserted and advanced until CSF return was seen. Advanced the catheter to 42mm. CSF flow still present. Catheter was then tunneled through a separate insertion site and sutured down securely with nylon suture. Initial incision was suture closed with nylon as well. CSF flow through catheter still patent. Catheter was connected to external drainage system and placed at 2mm of H2O. Sterile dressing applied. Patient tolerated the procedure well and vital signs were stable throughout.

## 2021-03-16 NOTE — Progress Notes (Signed)
0930: Patient ambulated to bathroom with minimal assist, alert, eyes open while walking.  55: RN returned to room with 1000 meds. Patient lethargic, difficult to arouse, responds only to painful stimuli. A&O x 1 (name) only. Margo Aye neurosurgery NP notified immediately of assessment findings. Stat head CT ordered. Cleviprex restarted due to SBP in 180s.   1000: Patient moving frequently while in CT, remains A&O x 1 (name).   1013: This RN returned from [place] with patient. Margo Aye NP notified that exam was completed.  1029: This RN received notice from neurosurgery that patient will need ventriculostomy drain placed. Drain supplies gathered and at bedside.

## 2021-03-17 MED ORDER — INFLUENZA VAC SPLIT QUAD 0.5 ML IM SUSY
0.5000 mL | PREFILLED_SYRINGE | INTRAMUSCULAR | Status: DC | PRN
  Filled 2021-03-17: qty 0.5

## 2021-03-17 NOTE — Evaluation (Signed)
Speech Language Pathology Evaluation Patient Details Name: Edra Riccardi MRN: [id number] DOB: [birth year] Today's Date: 03/17/2021 Time: 3646-8032 SLP Time Calculation (min) (ACUTE ONLY): 18 min  Problem List:  Patient Active Problem List   Diagnosis Date Noted   SAH (subarachnoid hemorrhage) ([place]) 03/15/2021   Mixed hyperlipidemia 06/03/2018   Prediabetes 06/03/2018   S/P laparoscopic assisted vaginal hysterectomy (LAVH) 10/01/2016   Menorrhagia 09/10/2016   Iron deficiency anemia 05/06/2016   Acquired hypothyroidism 05/24/2013   Depression with anxiety 05/24/2013   Past Medical History:  Past Medical History:  Diagnosis Date   Anxiety    Blood transfusion without reported diagnosis    Depression    Hypertension    no medications at this time   Hypothyroidism    Iron deficiency anemia    Thyroid disease    Past Surgical History:  Past Surgical History:  Procedure Laterality Date   IR ANGIO INTRA EXTRACRAN SEL COM CAROTID INNOMINATE UNI L MOD SED  03/16/2021   IR ANGIO INTRA EXTRACRAN SEL INTERNAL CAROTID UNI R MOD SED  03/15/2021   IR ANGIO VERTEBRAL SEL VERTEBRAL UNI L MOD SED  03/15/2021   LAPAROSCOPIC VAGINAL HYSTERECTOMY WITH SALPINGECTOMY Bilateral 10/01/2016   Procedure: LAPAROSCOPIC ASSISTED VAGINAL HYSTERECTOMY WITH SALPINGECTOMY;  Surgeon: Paula Compton, MD;  Location: [facility];  Service: Gynecology;  Laterality: Bilateral;   WISDOM TOOTH EXTRACTION     HPI:  55 y.o. female presenting to the hospital complaining of worst headache found to have [place] with change in mental status 10/29 s/p ventriculostomy drain. Medical hx includes HTN, thyroid disease.   Assessment / Plan / Recommendation Clinical Impression  Pt presents with mild to moderate level cognitive deficits at this time. [employer] Mental Status administered 12/30 with deficits in immediate and delayed recall 0/8, 05, slowed thought organization, and decreased executive function skills.  PLOF pt fully independent; pt works full time as a [employer]; she has supportive family, son and mother. She notes having workup for parathyroidism and states premorbid word finding difficulties >1 year including anomia of speech. Pt did exhibit some disruptions in communication fluency due to anomia of speech but with repetitions of questions and increased time was able to compensate well. Motor speech skills were intact. Will continue to follow for cognitive linguistic recovery.    SLP Assessment  SLP Recommendation/Assessment: Patient needs continued Speech Lanaguage Pathology Services SLP Visit Diagnosis: Aphasia (R47.01);Cognitive communication deficit (R41.841)    Recommendations for follow up therapy are one component of a multi-disciplinary discharge planning process, led by the attending physician.  Recommendations may be updated based on patient status, additional functional criteria and insurance authorization.    Follow Up Recommendations  Other (comment) (TBD)    Frequency and Duration min 2x/week  2 weeks      SLP Evaluation Cognition  Overall Cognitive Status: Impaired/Different from baseline Arousal/Alertness: Awake/alert Orientation Level: Oriented X4 Attention: Sustained;Alternating Sustained Attention: Impaired Sustained Attention Impairment: Verbal complex;Functional complex Alternating Attention: Impaired Alternating Attention Impairment: Verbal complex;Functional complex Memory: Impaired Memory Impairment: Decreased short term memory;Decreased recall of new information Awareness: Appears intact Problem Solving: Impaired Problem Solving Impairment: Verbal complex;Functional complex Executive Function: Organizing;Self Monitoring Organizing: Impaired Self Monitoring: Impaired       Comprehension  Auditory Comprehension Overall Auditory Comprehension: Appears within functional limits for tasks assessed    Expression Expression Primary Mode of  Expression: Verbal Verbal Expression Overall Verbal Expression: Impaired at baseline (reports some intermittent word finding difficulties at baseline; difficult to tell if  worse since hospitalization) Initiation: No impairment Repetition: No impairment Naming: Impairment Verbal Errors: Aware of errors Interfering Components: Attention Written Expression Dominant Hand: Right   Oral / Motor  Oral Motor/Sensory Function Overall Oral Motor/Sensory Function: Within functional limits Motor Speech Overall Motor Speech: Appears within functional limits for tasks assessed   GO                    Hayden Rasmussen MA, CCC-SLP Acute Rehabilitation Services   03/17/2021, 11:44 AM

## 2021-03-17 NOTE — Progress Notes (Signed)
Inpatient Rehab Admissions Coordinator:  ? ?Per therapy recommendations,  patient was screened for CIR candidacy by Yasamin Karel, MS, CCC-SLP. At this time, Pt. Appears to be a a potential candidate for CIR. I will place   order for rehab consult per protocol for full assessment. Please contact me any with questions. ? ?Corban Kistler, MS, CCC-SLP ?Rehab Admissions Coordinator  ?[phone] (celll) ?[phone] (office) ? ?

## 2021-03-17 NOTE — Evaluation (Signed)
Physical Therapy Evaluation Patient Details Name: Amanda Henry MRN: [id number] DOB: [birth year] Today's Date: 03/17/2021  History of Present Illness  This 55 y.o. female admitted with sudden onset severe HA.  She was found to have [place].  Pt with increased lethargy and decreased responsiveness 10/30. she was found to have developing hydrocephalus and underwent EVD placement.  PMH includes:  anxiety, depression, HTN  Clinical Impression  PTA, patient lives alone and reports independence and working as [employer] Land. Patient presents with impaired balance, weakness, decreased activity tolerance, and impaired cognition. Patient required minA progressing to modA+2 for ambulation as she demonstrated increased lateral sway as distance increased, suspect orthostasis. Patient will benefit from skilled PT services during acute stay to address listed deficits. Recommend CIR at discharge to maximize functional independence and safety prior to returning home.   BP in supine 144/76 Sitting EOB after ambulation 119/77 (complaints of dizziness and worsening balance) Supine 138/74 RN notified.       Recommendations for follow up therapy are one component of a multi-disciplinary discharge planning process, led by the attending physician.  Recommendations may be updated based on patient status, additional functional criteria and insurance authorization.  Follow Up Recommendations Acute inpatient rehab (3hours/day)    Assistance Recommended at Discharge Intermittent Supervision/Assistance  Functional Status Assessment Patient has had a recent decline in their functional status and demonstrates the ability to make significant improvements in function in a reasonable and predictable amount of time.  Equipment Recommendations  Other (comment) (TBD)    Recommendations for Other Services Rehab consult     Precautions / Restrictions Precautions Precautions: Fall;Other (comment) (SBP <160) Precaution  Comments: orthostatic, EVD      Mobility  Bed Mobility Overal bed mobility: Needs Assistance Bed Mobility: Supine to Sit;Sit to Supine     Supine to sit: Min guard Sit to supine: Min guard        Transfers Overall transfer level: Needs assistance Equipment used: 2 person hand held assist Transfers: Sit to/from [facility] Sit to Stand: Min assist;+2 safety/equipment Stand pivot transfers: Min assist;+2 safety/equipment;+2 physical assistance         General transfer comment: pt requires assist for balance    Ambulation/Gait                Stairs            Wheelchair Mobility    Modified Rankin (Stroke Patients Only)       Balance Overall balance assessment: Needs assistance Sitting-balance support: Feet supported Sitting balance-Leahy Scale: Fair     Standing balance support: Single extremity supported Standing balance-Leahy Scale: Poor Standing balance comment: requires UE support                             Pertinent Vitals/Pain Pain Assessment: 0-10 Pain Score: 5  Pain Location: head Pain Descriptors / Indicators: Headache Pain Intervention(s): Repositioned;Monitored during session;Limited activity within patient's tolerance    Home Living Family/patient expects to be discharged to:: Private residence Living Arrangements: Alone   Type of Home: Apartment Home Access: Level entry       Home Layout: One level Home Equipment: None      Prior Function Prior Level of Function : Independent/Modified Independent;Driving;Working/employed               ADLs Comments: pt works as a second Personal assistant   Dominant Hand: Right  Extremity/Trunk Assessment   Upper Extremity Assessment Upper Extremity Assessment: Defer to OT evaluation RUE Deficits / Details: grossly 4/5 LUE Deficits / Details: grossly 4/5    Lower Extremity Assessment Lower Extremity Assessment: RLE  deficits/detail;LLE deficits/detail RLE Deficits / Details: grossly 4+/5 LLE Deficits / Details: grossly 4/5    Cervical / Trunk Assessment Cervical / Trunk Assessment: Normal  Communication      Cognition Arousal/Alertness: Awake/alert Behavior During Therapy: WFL for tasks assessed/performed Overall Cognitive Status: Impaired/Different from baseline Area of Impairment: Attention;Memory;Following commands;Safety/judgement;Awareness;Problem solving                   Current Attention Level: Selective Memory: Decreased short-term memory Following Commands: Follows one step commands consistently;Follows multi-step commands inconsistently   Awareness: Emergent Problem Solving: Slow processing;Decreased initiation;Requires verbal cues;Requires tactile cues General Comments: Pt at times slow to process info.  Requires verbal cues for problem solving  with novel tasks        General Comments General comments (skin integrity, edema, etc.): Pt initially required min A +2 for safety with ambulation in the room, but noted with Rt lateral sway.   Once in hallway, sway became worse with pt requiring up to mod A.  BP at beginning of session 144/76, and BP was taken again on EOB after she ambulated back to bed.  BP at that time 119/77.  Pt with c/o pain 7/10 head and back.   She was returned to supine with BP 138/74 - RN aware    Exercises     Assessment/Plan    PT Assessment Patient needs continued PT services  PT Problem List Decreased strength;Decreased activity tolerance;Decreased balance;Decreased mobility;Decreased coordination;Decreased cognition       PT Treatment Interventions DME instruction;Gait training;Stair training;Functional mobility training;Therapeutic activities;Balance training;Therapeutic exercise;Patient/family education    PT Goals (Current goals can be found in the Care Plan section)  Acute Rehab PT Goals Patient Stated Goal: to get better PT Goal  Formulation: With patient Time For Goal Achievement: 03/31/21 Potential to Achieve Goals: Good    Frequency Min 4X/week   Barriers to discharge        Co-evaluation PT/OT/SLP Co-Evaluation/Treatment: Yes Reason for Co-Treatment: For patient/therapist safety;To address functional/ADL transfers PT goals addressed during session: Balance;Mobility/safety with mobility OT goals addressed during session: ADL's and self-care       AM-PAC PT "6 Clicks" Mobility  Outcome Measure Help needed turning from your back to your side while in a flat bed without using bedrails?: A Little Help needed moving from lying on your back to sitting on the side of a flat bed without using bedrails?: A Little Help needed moving to and from a bed to a chair (including a wheelchair)?: A Little Help needed standing up from a chair using your arms (e.g., wheelchair or bedside chair)?: Total Help needed to walk in hospital room?: Total Help needed climbing 3-5 steps with a railing? : Total 6 Click Score: 12    End of Session   Activity Tolerance: Treatment limited secondary to medical complications (Comment) (orthostatic) Patient left: in bed;with call bell/phone within reach Nurse Communication: Mobility status;Other (comment) (orthostatics) PT Visit Diagnosis: Unsteadiness on feet (R26.81);Muscle weakness (generalized) (M62.81);Other abnormalities of gait and mobility (R26.89);Other symptoms and signs involving the nervous system (R29.898)    Time: 0263-7858 PT Time Calculation (min) (ACUTE ONLY): 33 min   Charges:   PT Evaluation $PT Eval Moderate Complexity: 1 Mod PT Treatments $Therapeutic Activity: 8-22 mins  Adrie Picking A. Gilford Rile PT, DPT Acute Rehabilitation Services Pager [phone] Office [phone]   [name] 03/17/2021, 5:12 PM

## 2021-03-17 NOTE — Progress Notes (Signed)
Subjective: Patient arouses easily.  States name and place.  Objective: Vital signs in last 24 hours: Temp:  [98.8 F (37.1 C)-99.7 F (37.6 C)] 99.7 F (37.6 C) (10/30 0400) Pulse Rate:  [51-86] 75 (10/30 0700) Resp:  [13-35] 15 (10/30 0700) BP: (90-183)/(57-122) 127/58 (10/30 0700) SpO2:  [93 %-100 %] 99 % (10/30 0700) Arterial Line BP: (101-213)/(79-98) 188/91 (10/29 0825)  Intake/Output from previous day: 10/29 0701 - 10/30 0700 In: 2320.5 [I.V.:2320.5] Out: 2201 [Urine:1950; Drains:251] Intake/Output this shift: No intake/output data recorded.  She arouses easily, she is oriented to person and place, ventriculostomy is draining well.  She seems to move all extremities well.  Lab Results: Lab Results  Component Value Date   WBC 13.1 (H) 03/15/2021   HGB 14.0 03/15/2021   HCT 42.6 03/15/2021   MCV 89.3 03/15/2021   PLT 306 03/15/2021   Lab Results  Component Value Date   INR 1.0 03/15/2021   BMET Lab Results  Component Value Date   NA 140 03/15/2021   K 2.6 (LL) 03/15/2021   CL 106 03/15/2021   CO2 25 03/15/2021   GLUCOSE 138 (H) 03/15/2021   BUN 8 03/15/2021   CREATININE 0.71 03/15/2021   CALCIUM 13.5 (HH) 03/15/2021    Studies/Results: CT HEAD WO CONTRAST (5MM)  Result Date: 03/16/2021 CLINICAL DATA:  Subarachnoid hemorrhage. Increasing confusion. More difficult to arouse. EXAM: CT HEAD WITHOUT CONTRAST TECHNIQUE: Contiguous axial images were obtained from the base of the skull through the vertex without intravenous contrast. COMPARISON:  CT head without contrast 03/15/2021 FINDINGS: Brain: Diffuse subarachnoid hemorrhage again noted. Hemorrhage is present in the suprasellar cisterns, slightly asymmetric on the right. Hemorrhage extends into the sylvian fissure bilaterally. Blood products are noted along the anterior falx. Intraventricular blood is seen layering in the posterior horns of the lateral ventricles bilaterally. Progressive dilation of the lateral  and third ventricles noted. The fourth ventricle is also dilated relative to the prior study. Blood is noted within the aqueduct of Sylvius and in the foramina of the fourth ventricle. Blood products are again noted in the prepontine space. No acute cortical infarct present. Vascular: No hyperdense vessel or unexpected calcification. Skull: Calvarium is intact. No focal lytic or blastic lesions are present. No significant extracranial soft tissue lesion is present. Sinuses/Orbits: The paranasal sinuses and mastoid air cells are clear. Other: IMPRESSION: 1. Persistent diffuse subarachnoid hemorrhage without definite new bleed. 2. Hemorrhage is again noted in the suprasellar cisterns, slightly asymmetric on the right. 3. Blood products are more prominent within the aqueduct of Sylvius and in the foramina of the fourth ventricle. 4. Progressive dilation of the lateral, third and fourth ventricles compatible with developing hydrocephalus. 5. Intraventricular blood is layering in the posterior horns of the lateral ventricles bilaterally. 6. No acute cortical infarct. Critical Value/emergent results were called by telephone at the time of interpretation on 03/16/2021 at 10:21 am to provider Sherley Bounds, who verbally acknowledged these results. Electronically Signed   By: San Morelle M.D.   On: 03/16/2021 10:29   VAS [place] TRANSCRANIAL DOPPLER  Result Date: 03/16/2021  Transcranial Doppler Patient Name:  Amanda Henry  Date of Exam:   03/16/2021 Medical Rec #: [id number]         Accession #:    [id number] Date of Birth: 08/23/1965         Patient Gender: F Patient Age:   55 years Exam Location:  [facility] Procedure:      VAS [place] TRANSCRANIAL DOPPLER  Referring Phys: Consuella Lose --------------------------------------------------------------------------------  Indications: Subarachnoid hemorrhage. Limitations: Poor insonation of left transtemporal window. Comparison Study: No prior studies.  Performing Technologist: Darlin Coco RDMS, RVT  Examination Guidelines: A complete evaluation includes B-mode imaging, spectral Doppler, color Doppler, and power Doppler as needed of all accessible portions of each vessel. Bilateral testing is considered an integral part of a complete examination. Limited examinations for reoccurring indications may be performed as noted.  +----------+-------------+----------+-----------+-------+ RIGHT TCD Right VM (cm)Depth (cm)PulsatilityComment +----------+-------------+----------+-----------+-------+ MCA           58.00                 1.49            +----------+-------------+----------+-----------+-------+ ACA          -17.00                 0.48            +----------+-------------+----------+-----------+-------+ Term ICA      32.00                 1.42            +----------+-------------+----------+-----------+-------+ PCA           31.00                 1.20            +----------+-------------+----------+-----------+-------+ Opthalmic     19.00                 1.47            +----------+-------------+----------+-----------+-------+ ICA siphon    20.00                 1.57            +----------+-------------+----------+-----------+-------+ Vertebral    -28.00                  126            +----------+-------------+----------+-----------+-------+ Distal ICA    18.00                 1.26            +----------+-------------+----------+-----------+-------+  +----------+------------+----------+-----------+-----------------------------+ LEFT TCD  Left VM (cm)Depth (cm)Pulsatility           Comment            +----------+------------+----------+-----------+-----------------------------+ MCA          35.00                 1.51    Insonated from right temporal +----------+------------+----------+-----------+-----------------------------+ ACA                                             Unable to insonate        +----------+------------+----------+-----------+-----------------------------+ Term ICA                                        Unable to insonate       +----------+------------+----------+-----------+-----------------------------+ PCA          17.00                 1.24                                  +----------+------------+----------+-----------+-----------------------------+  Opthalmic    12.00                  .90                                  +----------+------------+----------+-----------+-----------------------------+ ICA siphon   16.00                 1.91                                  +----------+------------+----------+-----------+-----------------------------+ Vertebral    -35.00                1.42                                  +----------+------------+----------+-----------+-----------------------------+ Distal ICA   22.00                 1.46                                  +----------+------------+----------+-----------+-----------------------------+  +------------+------+-------+             VM cm Comment +------------+------+-------+ Prox Basilar-42.00        +------------+------+-------+ Dist Basilar-33.00        +------------+------+-------+ +----------------------+----+ Right Lindegaard Ratio3.22 +----------------------+----+ +---------------------+----+ Left Lindegaard Ratio1.59 +---------------------+----+  Summary: This was a normal transcranial Doppler study, with normal flow direction and velocity of all identified vessels of the anterior and posterior circulations, with no evidence of stenosis, vasospasm or occlusion. There was no evidence of intracranial disease. however, poor left transtemproal window likely underestimated the MFVs of left PCA and left ICA siphon. globally elevated pulsatility index indicates elevated intracranial pressure. clinical correlation is recommended. *See table(s) above for TCD measurements and  observations.  Diagnosing physician: Rosalin Hawking MD Electronically signed by Rosalin Hawking MD on 03/16/2021 at 51:14:53 PM.    Final     Assessment/Plan: Arteriogram negative subarachnoid hemorrhage with hydrocephalus, ventriculostomy draining, neurologically improved compared to yesterday.  Continue current management.  Estimated body mass index is 27.43 kg/m as calculated from the following:   Height as of 12/26/20: 5' 8.6" (1.742 m).   Weight as of 12/26/20: 83.3 kg.    LOS: 2 days    Eustace Moore 03/17/2021, 7:23 AM

## 2021-03-17 NOTE — Evaluation (Signed)
Occupational Therapy Evaluation Patient Details Name: Amanda Henry MRN: [id number] DOB: 11-30-1965 Today's Date: 03/17/2021   History of Present Illness This 55 y.o. female admitted with sudden onset severe HA.  She was found to have [place].  Pt with increased lethargy and decreased responsiveness 10/30. she was found to have developing hydrocephalus and underwent EVD placement.  PMH includes:  anxiety, depression, HTN   Clinical Impression   Pt admitted with above. She demonstrates the below listed deficits and will benefit from continued OT to maximize safety and independence with BADLs.  Pt presents to OT with weakness, impaired balance, impaired cognition, decreased activity tolerance, as well as headache and back pain.  She currently requires set up - mod A for UB ADLs and mod A - max A for LB ADLs.  She requires min A progressing to mod A +2 for functional mobility as she demonstrated increased lateral sway with mobility.  Pt lives alone and was fully independent. She is a second grade Education officer, [employer].  Recommend CIR.   BP beginning of session 144/76 Sitting EOB after ambulation 119/77 (pt with c/o increased dizziness and balance worse) Supine 138/74.         Recommendations for follow up therapy are one component of a multi-disciplinary discharge planning process, led by the attending physician.  Recommendations may be updated based on patient status, additional functional criteria and insurance authorization.   Follow Up Recommendations  Acute inpatient rehab (3hours/day)    Assistance Recommended at Discharge Frequent or constant Supervision/Assistance  Functional Status Assessment  Patient has had a recent decline in their functional status and demonstrates the ability to make significant improvements in function in a reasonable and predictable amount of time.  Equipment Recommendations       Recommendations for Other Services Rehab consult     Precautions / Restrictions  Precautions Precautions: Fall;Other (comment) (SBP <160)      Mobility Bed Mobility Overal bed mobility: Needs Assistance Bed Mobility: Supine to Sit;Sit to Supine     Supine to sit: Min guard Sit to supine: Min guard        Transfers Overall transfer level: Needs assistance Equipment used: 2 person hand held assist Transfers: Sit to/from [facility] Sit to Stand: Min assist;+2 safety/equipment Stand pivot transfers: Min assist;+2 safety/equipment;+2 physical assistance         General transfer comment: pt requires assist for balance      Balance Overall balance assessment: Needs assistance Sitting-balance support: Feet supported Sitting balance-Leahy Scale: Fair     Standing balance support: Single extremity supported Standing balance-Leahy Scale: Poor Standing balance comment: requires UE support                           ADL either performed or assessed with clinical judgement   ADL Overall ADL's : Needs assistance/impaired Eating/Feeding: Independent   Grooming: Wash/dry hands;Wash/dry face;Oral care;Supervision/safety;Sitting   Upper Body Bathing: Minimal assistance;Sitting   Lower Body Bathing: Moderate assistance;Sit to/from stand   Upper Body Dressing : Moderate assistance;Sitting   Lower Body Dressing: Maximal assistance;Sit to/from stand   Toilet Transfer: Minimal assistance;+2 for physical assistance;+2 for safety/equipment;Stand-pivot;BSC   Toileting- Clothing Manipulation and Hygiene: Moderate assistance;Sit to/from stand       Functional mobility during ADLs: +2 for physical assistance;+2 for safety/equipment;Moderate assistance       Vision Patient Visual Report: No change from baseline Additional Comments: will benefit from further assessment     Perception  Praxis      Pertinent Vitals/Pain Pain Assessment: 0-10 Pain Score: 5  Faces Pain Scale: No hurt Pain Location: head Pain Descriptors /  Indicators: Headache Pain Intervention(s): Repositioned;Monitored during session;Limited activity within patient's tolerance     Hand Dominance Right   Extremity/Trunk Assessment Upper Extremity Assessment Upper Extremity Assessment: RUE deficits/detail;LUE deficits/detail RUE Deficits / Details: grossly 4/5 LUE Deficits / Details: grossly 4/5   Lower Extremity Assessment Lower Extremity Assessment: Defer to PT evaluation   Cervical / Trunk Assessment Cervical / Trunk Assessment: Normal   Communication     Cognition Arousal/Alertness: Awake/alert Behavior During Therapy: WFL for tasks assessed/performed Overall Cognitive Status: Impaired/Different from baseline Area of Impairment: Attention;Memory;Following commands;Safety/judgement;Awareness;Problem solving                   Current Attention Level: Selective Memory: Decreased short-term memory Following Commands: Follows one step commands consistently;Follows multi-step commands inconsistently   Awareness: Emergent Problem Solving: Slow processing;Decreased initiation;Requires verbal cues;Requires tactile cues General Comments: Pt at times slow to process info.  Requires verbal cues for problem solving  with novel tasks     General Comments  Pt initially required min A +2 for safety with ambulation in the room, but noted with Rt lateral sway.   Once in hallway, sway became worse with pt requiring up to mod A.  BP at beginning of session 144/76, and BP was taken again on EOB after she ambulated back to bed.  BP at that time 119/77.  Pt with c/o pain 7/10 head and back.   She was returned to supine with BP 138/74 - RN aware    Exercises     Shoulder Instructions      Home Living Family/patient expects to be discharged to:: Private residence Living Arrangements: Alone Available Help at Discharge: Family;Available 24 hours/day Type of Home: Apartment Home Access: Level entry     Home Layout: One level      Bathroom Shower/Tub: Occupational psychologist: Standard     Home Equipment: None      Lives With: Alone    Prior Functioning/Environment Prior Level of Function : Independent/Modified Independent;Driving;Working/employed               ADLs Comments: pt works as a second Automotive engineer Problem List: Decreased strength;Decreased activity tolerance;Impaired balance (sitting and/or standing);Impaired vision/perception;Decreased cognition;Decreased safety awareness;Pain;Cardiopulmonary status limiting activity      OT Treatment/Interventions: Self-care/ADL training;DME and/or AE instruction;Cognitive remediation/compensation;Therapeutic activities;Visual/perceptual remediation/compensation;Patient/family education;Balance training    OT Goals(Current goals can be found in the care plan section) Acute Rehab OT Goals Patient Stated Goal: did not state OT Goal Formulation: With patient Time For Goal Achievement: 04/01/21 Potential to Achieve Goals: Good ADL Goals Pt Will Perform Grooming: with min guard assist;standing Pt Will Perform Upper Body Bathing: with set-up;sitting Pt Will Perform Lower Body Bathing: with min guard assist;sit to/from stand Pt Will Perform Upper Body Dressing: with set-up;sitting Pt Will Perform Lower Body Dressing: with min guard assist;sit to/from stand Pt Will Transfer to Toilet: with min guard assist;ambulating;regular height toilet;bedside commode;grab bars Pt Will Perform Toileting - Clothing Manipulation and hygiene: with min guard assist;sit to/from stand Additional ADL Goal #1: Pt will be able to alternate/divide attention without cues during ADL tasks  OT Frequency: Min 3X/week   Barriers to D/C:            Co-evaluation PT/OT/SLP Co-Evaluation/Treatment: Yes Reason for Co-Treatment: For patient/therapist  safety;To address functional/ADL transfers   OT goals addressed during session: ADL's and self-care       AM-PAC OT "6 Clicks" Daily Activity     Outcome Measure Help from another person eating meals?: None Help from another person taking care of personal grooming?: A Little Help from another person toileting, which includes using toliet, bedpan, or urinal?: A Lot Help from another person bathing (including washing, rinsing, drying)?: A Lot Help from another person to put on and taking off regular upper body clothing?: A Lot Help from another person to put on and taking off regular lower body clothing?: A Lot 6 Click Score: 15   End of Session Nurse Communication: Mobility status  Activity Tolerance: Patient limited by pain;Treatment limited secondary to medical complications (Comment) Patient left: in bed;with call bell/phone within reach;with bed alarm set  OT Visit Diagnosis: Unsteadiness on feet (R26.81);Cognitive communication deficit (R41.841);Pain Symptoms and signs involving cognitive functions: Nontraumatic SAH Pain - part of body:  (head)                Time: 6742-5525 OT Time Calculation (min): 34 min Charges:  OT General Charges $OT Visit: 1 Visit OT Evaluation $OT Eval Moderate Complexity: 1 Mod  Nilsa Nutting., OTR/L Acute Rehabilitation Services Pager [phone] Office [phone]   Lucille Passy M 03/17/2021, 2:58 PM

## 2021-03-17 NOTE — Progress Notes (Signed)
PT Cancellation Note  Patient Details Name: Amanda Henry MRN: [id number] DOB: 1965-10-08   Cancelled Treatment:    Reason Eval/Treat Not Completed: Medical issues which prohibited therapy Patient SBP goal <160. Currently BP elevated 165/93. RN request to hold for now until BP treated. PT will re-attempt as time allows.   Villa Burgin A. Gilford Rile PT, DPT Acute Rehabilitation Services Pager [phone] Office [phone]    [name] 03/17/2021, 11:57 AM

## 2021-03-18 ENCOUNTER — Inpatient Hospital Stay (HOSPITAL_COMMUNITY): Payer: BC Managed Care – PPO

## 2021-03-18 ENCOUNTER — Encounter (HOSPITAL_COMMUNITY): Payer: Self-pay | Admitting: Neurosurgery

## 2021-03-18 DIAGNOSIS — I609 Nontraumatic subarachnoid hemorrhage, unspecified: Secondary | ICD-10-CM

## 2021-03-18 LAB — POCT I-STAT 7, (LYTES, BLD GAS, ICA,H+H)
Acid-base deficit: 3 mmol/L — ABNORMAL HIGH (ref 0.0–2.0)
Bicarbonate: 22.1 mmol/L (ref 20.0–28.0)
Calcium, Ion: 1.83 mmol/L (ref 1.15–1.40)
HCT: 40 % (ref 36.0–46.0)
Hemoglobin: 13.6 g/dL (ref 12.0–15.0)
O2 Saturation: 99 %
Potassium: 3.1 mmol/L — ABNORMAL LOW (ref 3.5–5.1)
Sodium: 142 mmol/L (ref 135–145)
TCO2: 23 mmol/L (ref 22–32)
pCO2 arterial: 38.7 mmHg (ref 32.0–48.0)
pH, Arterial: 7.366 (ref 7.350–7.450)
pO2, Arterial: 124 mmHg — ABNORMAL HIGH (ref 83.0–108.0)

## 2021-03-18 NOTE — Progress Notes (Signed)
Transcranial Doppler   Date POD PCO2 HCT BP   MCA ACA PCA OPHT SIPH VERT Basilar  10/29 [facility]         Right  Left   58  35   -17  *   -31  -17   19  12   20  16    -28  -35   -42        10/31 [name]         Right  Left    64   *    -18   *    18   -29    *   *    20   16    -30   -39    -57                 Right  Left                                                                 Right  Left                                                                 Right  Left                                                               Right  Left                                                               Right  Left                                                       MCA = Middle Cerebral Artery      OPHT = Opthalmic Artery     BASILAR = Basilar Artery   ACA = Anterior Cerebral Artery     SIPH = Carotid Siphon PCA = Posterior Cerebral Artery   VERT = Verterbral Artery                    Normal MCA = 62+\-12 ACA = 50+\-12 PCA = 42+\-23    RT [place] =  1.88 LT [place]   03/18/2021 2:25 PM Elon Eoff RVT, RDMS

## 2021-03-18 NOTE — Progress Notes (Signed)
PT Cancellation Note  Patient Details Name: Amanda Henry MRN: [id number] DOB: Apr 19, 1966   Cancelled Treatment:    Reason Eval/Treat Not Completed: Medical issues which prohibited therapy BP currently outside of parameters. Per RN, drain tubing was kinked earlier this AM and patient did not tolerate well. PT will re-attempt at another time.   Kazimierz Springborn A. Gilford Rile PT, DPT Acute Rehabilitation Services Pager [phone] Office [phone]    [name] 03/18/2021, 11:39 AM

## 2021-03-18 NOTE — Progress Notes (Signed)
Inpatient Rehab Admissions Coordinator:   Attempted to meet with pt.  Per RN and PT drain accidentally kinked this AM and pt did not tolerate.  Per RN, Dr. Kathyrn Sheriff feels probably won't be ready for further surgery until late this week, possibly early next week.  Will continue to follow peripherally for now.    Shann Medal, PT, DPT Admissions Coordinator [phone] 03/18/21  12:06 PM

## 2021-03-19 ENCOUNTER — Inpatient Hospital Stay (HOSPITAL_COMMUNITY): Payer: BC Managed Care – PPO

## 2021-03-19 ENCOUNTER — Encounter (HOSPITAL_COMMUNITY): Payer: Self-pay

## 2021-03-19 ENCOUNTER — Ambulatory Visit (HOSPITAL_COMMUNITY)
Admission: RE | Admit: 2021-03-19 | Discharge: 2021-03-19 | Disposition: A | Discharge status: Home / Self Care | Payer: BC Managed Care – PPO | Source: Ambulatory Visit | Attending: Surgery | Admitting: Surgery

## 2021-03-19 NOTE — Progress Notes (Signed)
  NEUROSURGERY PROGRESS NOTE   No issues overnight. Pt has mild HA, no other c/o.  EXAM:  BP (!) 149/82 (BP Location: Right Arm)   Pulse 66   Temp 98.4 F (36.9 C) (Oral)   Resp 13   LMP 08/28/2016 (Exact Date) Comment: come back on 09/15/2016  SpO2 100%   Awake, alert, oriented  Speech fluent, appropriate  CN grossly intact  5/5 BUE/BLE  EVD in place, patent. Bloody CSF  IMPRESSION:  55 y.o. female SAH d# 4, initial angio negative Communicating HCP with EVD  PLAN: - Cont EVD drainage - Cont [place] - OOB as tolerated - Will order inpatient thyroid u/s (was previously scheduled as outpatient)    Consuella Lose, MD [facility]

## 2021-03-19 NOTE — Progress Notes (Signed)
  NEUROSURGERY PROGRESS NOTE   No issues overnight. Pt has mild HA.  EXAM:  BP (!) 142/78   Pulse 66   Temp 98.4 F (36.9 C) (Oral)   Resp 13   LMP 08/28/2016 (Exact Date) Comment: come back on 09/15/2016  SpO2 100%   Awake, alert, oriented  Speech fluent, appropriate  CN grossly intact  5/5 BUE/BLE  EVD patent  IMPRESSION:  55 y.o. female [place] d#3, angio negative. Remains neurologically well Communicating HCP, s/p EVD placement  PLAN: - Cont EVD open to drain - OOB as tolerated - Cont Nimotop, TCD M/W/F - Plan on repeat angio next week, can attempt to wean EVD later this week.   Consuella Lose, MD [facility]

## 2021-03-19 NOTE — Progress Notes (Signed)
Physical Therapy Treatment Patient Details Name: Amanda Henry MRN: [id number] DOB: June 23, 1965 Today's Date: 03/19/2021   History of Present Illness This 55 y.o. female admitted with sudden onset severe HA.  She was found to have [place].  Pt with increased lethargy and decreased responsiveness 10/30. she was found to have developing hydrocephalus and underwent EVD placement.  PMH includes:  anxiety, depression, HTN, parathyroid dysfunction    PT Comments    Pt received in bed and states that she feels a bit better than yesterday. Pt continues to experience increased head pain with changes in position, ie supine to sit, sit to stand, and stand to sit. BP remained stable with changes in position today. Pt required min A for power up to stand due to LE weakness with knee and hip extension. Unsteady in standing without support but was able to ambulate with RW 150' with min A +2 and very slow, guarded gait. PT will continue to follow.    Recommendations for follow up therapy are one component of a multi-disciplinary discharge planning process, led by the attending physician.  Recommendations may be updated based on patient status, additional functional criteria and insurance authorization.  Follow Up Recommendations  Acute inpatient rehab (3hours/day)     Assistance Recommended at Discharge Intermittent Supervision/Assistance  Equipment Recommendations  Other (comment) (TBD)    Recommendations for Other Services Rehab consult     Precautions / Restrictions Precautions Precautions: Fall;Other (comment) (SBP <160) Precaution Comments: EVD Restrictions Weight Bearing Restrictions: No     Mobility  Bed Mobility Overal bed mobility: Needs Assistance Bed Mobility: Supine to Sit     Supine to sit: Min guard     General bed mobility comments: pt experiences increased head pain with each change in position, takes time with each transition to let in stabilize    Transfers Overall  transfer level: Needs assistance Equipment used: Rolling walker (2 wheels) Transfers: Sit to/from Stand Sit to Stand: Min assist;+2 safety/equipment           General transfer comment: min A for power up, decreased knee and hip extension strength.    Ambulation/Gait Ambulation/Gait assistance: Min assist;+2 safety/equipment Gait Distance (Feet): 150 Feet Assistive device: Rolling walker (2 wheels) Gait Pattern/deviations: Step-through pattern;Decreased stride length Gait velocity: decreased Gait velocity interpretation: <1.31 ft/sec, indicative of household ambulator General Gait Details: pt with stiff, guarded gait, avoids head turns. Min A for stability, +2 for IV poles.   Stairs             Wheelchair Mobility    Modified Rankin (Stroke Patients Only) Modified Rankin (Stroke Patients Only) Pre-Morbid Rankin Score: No symptoms Modified Rankin: Moderately severe disability     Balance Overall balance assessment: Needs assistance Sitting-balance support: Feet supported Sitting balance-Leahy Scale: Fair     Standing balance support: Single extremity supported Standing balance-Leahy Scale: Poor Standing balance comment: requires UE support                            Cognition Arousal/Alertness: Awake/alert Behavior During Therapy: WFL for tasks assessed/performed Overall Cognitive Status: Impaired/Different from baseline Area of Impairment: Memory;Following commands;Safety/judgement;Problem solving                   Current Attention Level: Selective Memory: Decreased short-term memory Following Commands: Follows one step commands consistently;Follows multi-step commands with increased time   Awareness: Emergent Problem Solving: Slow processing;Decreased initiation;Requires verbal cues;Requires tactile cues General Comments: Pt at  times slow to process info.        Exercises      General Comments General comments (skin integrity,  edema, etc.): BP 143/79. Pt with increased head pain with supine to sit, sit to stand, and stand to sit. Eases off after several minutes      Pertinent Vitals/Pain Pain Assessment: Faces Faces Pain Scale: Hurts even more Pain Location: head Pain Descriptors / Indicators: Headache Pain Intervention(s): Limited activity within patient's tolerance;Monitored during session;Patient requesting pain meds-RN notified    Home Living                          Prior Function            PT Goals (current goals can now be found in the care plan section) Acute Rehab PT Goals Patient Stated Goal: to get better PT Goal Formulation: With patient Time For Goal Achievement: 03/31/21 Potential to Achieve Goals: Good Progress towards PT goals: Progressing toward goals    Frequency    Min 4X/week      PT Plan Current plan remains appropriate    Co-evaluation              AM-PAC PT "6 Clicks" Mobility   Outcome Measure  Help needed turning from your back to your side while in a flat bed without using bedrails?: A Little Help needed moving from lying on your back to sitting on the side of a flat bed without using bedrails?: A Little Help needed moving to and from a bed to a chair (including a wheelchair)?: A Little Help needed standing up from a chair using your arms (e.g., wheelchair or bedside chair)?: Total Help needed to walk in hospital room?: Total Help needed climbing 3-5 steps with a railing? : Total 6 Click Score: 12    End of Session Equipment Utilized During Treatment: Gait belt Activity Tolerance: Patient tolerated treatment well Patient left: with call bell/phone within reach;in chair Nurse Communication: Mobility status PT Visit Diagnosis: Unsteadiness on feet (R26.81);Muscle weakness (generalized) (M62.81);Other abnormalities of gait and mobility (R26.89);Other symptoms and signs involving the nervous system (R29.898)     Time: 1115-1150 PT Time  Calculation (min) (ACUTE ONLY): 35 min  Charges:  $Gait Training: 23-37 mins                     Leighton Roach, [place]  Pager [phone] Office [place] 03/19/2021, 1:26 PM

## 2021-03-19 NOTE — Progress Notes (Signed)
Speech Language Pathology Treatment: Cognitive-Linquistic  Patient Details Name: Amanda Henry MRN: [id number] DOB: [birth year] Today's Date: 03/19/2021 Time: 5809-9833 SLP Time Calculation (min) (ACUTE ONLY): 24 min  Assessment / Plan / Recommendation Clinical Impression   Pt was seen for therapy targeting cognitive goals. Overall, with decreased awareness and recognition of staff although memory and thought organization improving from last targeted session. Pt oriented x4 and able to recall events and details from two days prior and from this morning. SLP conducted thought organization task (party planning); pt exhibited improving thought organization skills, reciting important tasks and components of the party independently. Pt reports awareness of improving deficits but reports difficulty remaining in the area of word finding. SLP provided word finding strategies including object description, activation of similar/related words, and phonemic cueing to aid word finding in conversation with others, providing models and examples. SLP reviewed memory strategies to aid pt with recall during hospital stay. Pt demonstrated minimal difficulty using smartphone to locate to-do list but required increased time to do so. SLP will continue to follow pt acutely for therapy targeting awareness, functional cognition tasks, and executing functioning.    HPI HPI: 55 y.o. female presenting to the hospital complaining of worst headache found to have [place] with change in mental status 10/29 s/p ventriculostomy drain. Medical hx includes HTN, thyroid disease.      SLP Plan  Continue with current plan of care      Recommendations for follow up therapy are one component of a multi-disciplinary discharge planning process, led by the attending physician.  Recommendations may be updated based on patient status, additional functional criteria and insurance authorization.    Recommendations                    General recommendations: Rehab consult Oral Care Recommendations: Oral care BID Follow up Recommendations: Inpatient Rehab SLP Visit Diagnosis: Aphasia (R47.01);Cognitive communication deficit (A25.053) Plan: Continue with current plan of care       GO               Dewitt Rota, SLP-Student  Dewitt Rota  03/19/2021, 2:44 PM

## 2021-03-20 ENCOUNTER — Inpatient Hospital Stay (HOSPITAL_COMMUNITY): Payer: BC Managed Care – PPO

## 2021-03-20 DIAGNOSIS — I609 Nontraumatic subarachnoid hemorrhage, unspecified: Secondary | ICD-10-CM

## 2021-03-20 MED ORDER — HYDROCODONE-ACETAMINOPHEN 5-325 MG PO TABS
1.0000 | ORAL_TABLET | ORAL | Status: DC | PRN
  Administered 2021-03-20 – 2021-03-21 (×3): 2 via ORAL
  Administered 2021-03-21 (×2): 1 via ORAL
  Administered 2021-03-22 – 2021-03-23 (×4): 2 via ORAL
  Administered 2021-03-25: 1 via ORAL
  Administered 2021-03-26 (×2): 2 via ORAL
  Administered 2021-03-27: 1 via ORAL
  Administered 2021-03-28 – 2021-03-29 (×4): 2 via ORAL
  Administered 2021-03-29: 1 via ORAL
  Administered 2021-03-31 – 2021-04-01 (×2): 2 via ORAL
  Filled 2021-03-20: qty 2
  Filled 2021-03-20: qty 1
  Filled 2021-03-20 (×5): qty 2
  Filled 2021-03-20: qty 1
  Filled 2021-03-20 (×3): qty 2
  Filled 2021-03-20: qty 1
  Filled 2021-03-20: qty 2
  Filled 2021-03-20 (×2): qty 1
  Filled 2021-03-20 (×4): qty 2
  Filled 2021-03-20: qty 1
  Filled 2021-03-20 (×2): qty 2

## 2021-03-20 MED ORDER — HYDRALAZINE HCL 20 MG/ML IJ SOLN
5.0000 mg | Freq: Four times a day (QID) | INTRAMUSCULAR | Status: DC | PRN
  Administered 2021-03-20 – 2021-04-02 (×10): 5 mg via INTRAVENOUS
  Filled 2021-03-20 (×10): qty 1

## 2021-03-20 NOTE — Progress Notes (Signed)
  NEUROSURGERY PROGRESS NOTE   No issues overnight. Pt has mild unchanged HA, otherwise doing well.  EXAM:  BP (!) 185/86 Comment: labetalol given  Pulse 66   Temp 99.4 F (37.4 C) (Oral)   Resp 20   LMP 08/28/2016 (Exact Date) Comment: come back on 09/15/2016  SpO2 100%   Awake, alert, oriented  Speech fluent, appropriate  CN grossly intact  5/5 BUE/BLE  EVD in place, patent. Bloody CSF  IMPRESSION:  55 y.o. female SAH d# 5, initial angio negative. Neurologically well. Communicating HCP with EVD  PLAN: - Cont EVD drainage @ 10mmHg. Will begin to wean later this week. - Cont [place]ot[place]. TCD today - OOB as tolerated    Consuella Lose, MD [facility]

## 2021-03-20 NOTE — Progress Notes (Signed)
Per Dr. Kathyrn Sheriff, SBP ok to be 160s.

## 2021-03-20 NOTE — Progress Notes (Signed)
Physical Therapy Treatment Patient Details Name: Christyne Mccain MRN: [id number] DOB: 1965/11/29 Today's Date: 03/20/2021   History of Present Illness This 55 y.o. female admitted with sudden onset severe HA.  She was found to have [place].  Pt with increased lethargy and decreased responsiveness 10/30. she was found to have developing hydrocephalus and underwent EVD placement.  PMH includes:  anxiety, depression, HTN, parathyroid dysfunction    PT Comments    Patient progressing towards physical therapy goals. Patient ambulating with minA and [place]. Patient reports dizziness with ambulation, however unable to confirm if getting worse with distance and providing inconsistent answers. BP stable. Patient continues to be limited by impaired balance, weakness, and impaired cognition. Continue to recommend comprehensive inpatient rehab (CIR) for post-acute therapy needs.    Recommendations for follow up therapy are one component of a multi-disciplinary discharge planning process, led by the attending physician.  Recommendations may be updated based on patient status, additional functional criteria and insurance authorization.  Follow Up Recommendations  Acute inpatient rehab (3hours/day)     Assistance Recommended at Discharge Intermittent Supervision/Assistance  Equipment Recommendations  Other (comment) (TBD)    Recommendations for Other Services       Precautions / Restrictions Precautions Precautions: Fall;Other (comment) Precaution Comments: SBP <160, EVD Restrictions Weight Bearing Restrictions: No     Mobility  Bed Mobility Overal bed mobility: Needs Assistance Bed Mobility: Supine to Sit     Supine to sit: Min assist     General bed mobility comments: minA for trunk elevation, increased time required    Transfers Overall transfer level: Needs assistance Equipment used: Rolling Murial Beam (2 wheels) Transfers: Sit to/from Stand Sit to Stand: Min assist           General  transfer comment: minA to rise and steady. Cues for hand placement    Ambulation/Gait Ambulation/Gait assistance: Min assist;+2 safety/equipment Gait Distance (Feet): 150 Feet Assistive device: Rolling Ambreen Tufte (2 wheels) Gait Pattern/deviations: Step-through pattern;Decreased stride length Gait velocity: decreased   General Gait Details: initially patient scissoring but when brought to attention, patient improved. Patient reports dizziness with mobility but unable to state if getting worse with distance. RN following behind for safety   Stairs             Wheelchair Mobility    Modified Rankin (Stroke Patients Only) Modified Rankin (Stroke Patients Only) Pre-Morbid Rankin Score: No symptoms Modified Rankin: Moderately severe disability     Balance Overall balance assessment: Needs assistance Sitting-balance support: Feet supported Sitting balance-Leahy Scale: Fair     Standing balance support: Bilateral upper extremity supported;During functional activity Standing balance-Leahy Scale: Poor Standing balance comment: requires UE support                            Cognition Arousal/Alertness: Awake/alert Behavior During Therapy: WFL for tasks assessed/performed Overall Cognitive Status: Impaired/Different from baseline Area of Impairment: Memory;Following commands;Safety/judgement;Problem solving                   Current Attention Level: (P) Selective Memory: Decreased short-term memory Following Commands: Follows one step commands consistently;Follows multi-step commands with increased time Safety/Judgement: Decreased awareness of safety Awareness: Emergent Problem Solving: Slow processing;Difficulty sequencing;Requires verbal cues General Comments: slow processing noted. Seems like she is using humor to hide cognitive deficits. Follows one step commands consistently but requires more time to process multi step.        Exercises  General Comments General comments (skin integrity, edema, etc.): (P) VSS      Pertinent Vitals/Pain Pain Assessment: Faces Faces Pain Scale: Hurts little more Pain Location: head Pain Descriptors / Indicators: Headache Pain Intervention(s): Monitored during session;Repositioned    Home Living                          Prior Function            PT Goals (current goals can now be found in the care plan section) Acute Rehab PT Goals Patient Stated Goal: to get better PT Goal Formulation: With patient Time For Goal Achievement: 03/31/21 Potential to Achieve Goals: Good Progress towards PT goals: Progressing toward goals    Frequency    Min 4X/week      PT Plan Current plan remains appropriate    Co-evaluation              AM-PAC PT "6 Clicks" Mobility   Outcome Measure  Help needed turning from your back to your side while in a flat bed without using bedrails?: A Little Help needed moving from lying on your back to sitting on the side of a flat bed without using bedrails?: A Little Help needed moving to and from a bed to a chair (including a wheelchair)?: A Little Help needed standing up from a chair using your arms (e.g., wheelchair or bedside chair)?: A Little Help needed to walk in hospital room?: A Little Help needed climbing 3-5 steps with a railing? : Total 6 Click Score: 16    End of Session Equipment Utilized During Treatment: Gait belt Activity Tolerance: Patient tolerated treatment well Patient left: in chair;with call bell/phone within reach;with nursing/sitter in room Nurse Communication: Mobility status PT Visit Diagnosis: Unsteadiness on feet (R26.81);Muscle weakness (generalized) (M62.81);Other abnormalities of gait and mobility (R26.89);Other symptoms and signs involving the nervous system (R29.898)     Time: 1884-1660 PT Time Calculation (min) (ACUTE ONLY): 19 min  Charges:  $Gait Training: 8-22 mins                      Brittiney Dicostanzo A. Gilford Rile PT, DPT Acute Rehabilitation Services Pager [phone] Office [phone]    [name] 03/20/2021, 5:42 PM

## 2021-03-20 NOTE — Progress Notes (Signed)
Transcranial Doppler   Date POD PCO2 HCT BP   MCA ACA PCA OPHT SIPH VERT Basilar  10/29 [facility]         Right  Left   58  35   -17  *   -31  -17   19  12   20  16    -28  -35   -42        10/31 [facility]         Right  Left    64   *    -18   *    18   -29    *   *    20   16    -30   -39    -21        11/2 [facility]         Right  Left    91   56    -30   *    -38   -43    27   22    51   *    -63   -29    -45                   Right  Left                                                                 Right  Left                                                               Right  Left                                                               Right  Left                                                       MCA = Middle Cerebral Artery      OPHT = Opthalmic Artery     BASILAR = Basilar Artery   ACA = Anterior Cerebral Artery     SIPH = Carotid Siphon PCA = Posterior Cerebral Artery   VERT = Verterbral Artery                    Normal MCA = 62+\-12 ACA = 50+\-12 PCA = 42+\-23    RT [place] =  2.84  LT [place] = 1.65   Darlin Coco, RDMS, RVT

## 2021-03-20 NOTE — Progress Notes (Signed)
SBP 167/82 over parameter of <160, not time for PRN labetalol.  Dr. Leighton Ruff called.  Awaiting call back.

## 2021-03-20 NOTE — Progress Notes (Signed)
Occupational Therapy Progress Note  Pt had just finished with PT and was dizzy so OT session limited.  She is now able to perform LB ADLs with min A.  She is unable to tolerate reading due to headache and dizziness.  Cognition improving but continues with deficits.  Recommend CIR.     03/20/21 1700  OT Visit Information  Last OT Received On 03/20/21  Assistance Needed +1  History of Present Illness This 55 y.o. female admitted with sudden onset severe HA.  She was found to have [place].  Pt with increased lethargy and decreased responsiveness 10/30. she was found to have developing hydrocephalus and underwent EVD placement.  PMH includes:  anxiety, depression, HTN, parathyroid dysfunction  Precautions  Precautions Fall;Other (comment)  Precaution Comments EVD  Pain Assessment  Pain Assessment Faces  Faces Pain Scale 4  Pain Location head  Pain Descriptors / Indicators Headache  Pain Intervention(s) Limited activity within patient's tolerance  Cognition  Arousal/Alertness Awake/alert  Behavior During Therapy WFL for tasks assessed/performed  Overall Cognitive Status Impaired/Different from baseline  Area of Impairment Attention;Following commands;Problem solving;Awareness  Current Attention Level Selective  Memory Decreased short-term memory  Following Commands Follows one step commands consistently;Follows multi-step commands inconsistently  Awareness Emergent  Problem Solving Slow processing;Difficulty sequencing;Requires verbal cues;Requires tactile cues  Upper Extremity Assessment  Upper Extremity Assessment Generalized weakness  Lower Extremity Assessment  Lower Extremity Assessment Defer to PT evaluation  Vision- Assessment  Vision Assessment? Yes  Eye Alignment WFL  Ocular Range of Motion [facility]  Alignment/Gaze Preference WDL  Tracking/Visual Pursuits Able to track stimulus in all quads without difficulty  Visual Fields No apparent deficits  Additional Comments Pt reports  dizziness when attempting to read so has avoided it thus far.  She is able to read the clock on the wall independently  Praxis  Praxis Intact  ADL  Overall ADL's  Needs assistance/impaired  Lower Body Dressing Minimal assistance;Sit to/from stand  Functional mobility during ADLs Minimal assistance  Balance  Overall balance assessment Needs assistance  Sitting-balance support Feet supported  Sitting balance-Leahy Scale Good  General Comments  General comments (skin integrity, edema, etc.) VSS  OT - End of Session  Activity Tolerance Other (comment) (limited by dizziness)  Patient left in chair;with call bell/phone within reach;with chair alarm set  Nurse Communication Mobility status  OT Assessment/Plan  OT Plan Discharge plan remains appropriate  OT Visit Diagnosis Unsteadiness on feet (R26.81);Cognitive communication deficit (R41.841);Pain  Symptoms and signs involving cognitive functions Nontraumatic SAH  Pain - part of body  (headache)  OT Frequency (ACUTE ONLY) Min 3X/week  Recommendations for Other Services Rehab consult  Follow Up Recommendations Acute inpatient rehab (3hours/day)  Assistance recommended at discharge Frequent or constant Supervision/Assistance  OT Equipment None recommended by OT  AM-PAC OT "6 Clicks" Daily Activity Outcome Measure (Version 2)  Help from another person eating meals? 4  Help from another person taking care of personal grooming? 3  Help from another person toileting, which includes using toliet, bedpan, or urinal? 2  Help from another person bathing (including washing, rinsing, drying)? 2  Help from another person to put on and taking off regular upper body clothing? 2  Help from another person to put on and taking off regular lower body clothing? 3  6 Click Score 16  Progressive Mobility  What is the highest level of mobility based on the progressive mobility assessment? Level 5 (Walks with assist in room/hall) - Balance while stepping  forward/back  and can walk in room with assist - Complete  Mobility Ambulated with assistance in room;Out of bed to chair with meals;Out of bed for toileting  OT Goal Progression  Progress towards OT goals Progressing toward goals  OT Time Calculation  OT Start Time (ACUTE ONLY) 1700  OT Stop Time (ACUTE ONLY) 1709  OT Time Calculation (min) 9 min  OT General Charges  $OT Visit 1 Visit  OT Treatments  $Self Care/Home Management  8-22 mins  Perception  Perception [facility]  Nilsa Nutting., OTR/L Acute Rehabilitation Services Pager [phone] Office [phone]

## 2021-03-21 ENCOUNTER — Inpatient Hospital Stay (HOSPITAL_COMMUNITY)
Admission: RE | Admit: 2021-03-21 | Discharge: 2021-03-21 | Disposition: A | Discharge status: Home / Self Care | Payer: BC Managed Care – PPO | Source: Ambulatory Visit | Attending: Surgery | Admitting: Surgery

## 2021-03-21 ENCOUNTER — Other Ambulatory Visit (HOSPITAL_COMMUNITY): Payer: BC Managed Care – PPO

## 2021-03-21 NOTE — Progress Notes (Signed)
Physical Therapy Treatment Patient Details Name: Amanda Henry MRN: [id number] DOB: [birth year] Today's Date: 03/21/2021   History of Present Illness This 55 y.o. female admitted with sudden onset severe HA.  She was found to have [place].  Pt with increased lethargy and decreased responsiveness 10/30. she was found to have developing hydrocephalus and underwent EVD placement.  PMH includes:  anxiety, depression, HTN, parathyroid dysfunction    PT Comments    Pt resting upon PT arrival to room, agreeable to gait training and dynamic balance intervention. Pt insisting on still using RW for external balance, and at times still requires light physical assist to steady. Pt demonstrates dynamic balance deficits when challenged via directional changes, head turning, speed changes. Pt still far from PLOF, will continue to follow.      Recommendations for follow up therapy are one component of a multi-disciplinary discharge planning process, led by the attending physician.  Recommendations may be updated based on patient status, additional functional criteria and insurance authorization.  Follow Up Recommendations  Acute inpatient rehab (3hours/day)     Assistance Recommended at Discharge Intermittent Supervision/Assistance  Equipment Recommendations  Other (comment) (TBD)    Recommendations for Other Services       Precautions / Restrictions Precautions Precautions: Fall;Other (comment) Precaution Comments: SBP <160, EVD (clamped during session by RN) Restrictions Weight Bearing Restrictions: No     Mobility  Bed Mobility Overal bed mobility: Needs Assistance Bed Mobility: Supine to Sit;Sit to Supine     Supine to sit: Min guard;HOB elevated Sit to supine: Min guard;HOB elevated   General bed mobility comments: for safety, increased time and PT managing lines for pt    Transfers Overall transfer level: Needs assistance Equipment used: Rolling walker (2 wheels) Transfers: Sit  to/from Stand Sit to Stand: Min guard           General transfer comment: for safety, cues for hand placement when rising and requires repeated cues to perform correctly.    Ambulation/Gait Ambulation/Gait assistance: Min guard;Min assist Gait Distance (Feet): 300 Feet Assistive device: Rolling walker (2 wheels) Gait Pattern/deviations: Step-through pattern;Decreased stride length Gait velocity: decr   General Gait Details: Min guard for safety with gait, but when challenged requires min assist to steady. Intermittent complaints of dizziness, BP 126/86 (baseline SBP 150). Increasing headache, further gait and balance challenge terminated.   Stairs             Wheelchair Mobility    Modified Rankin (Stroke Patients Only) Modified Rankin (Stroke Patients Only) Pre-Morbid Rankin Score: No symptoms Modified Rankin: Moderately severe disability     Balance Overall balance assessment: Needs assistance Sitting-balance support: Feet supported Sitting balance-Leahy Scale: Fair     Standing balance support: Bilateral upper extremity supported;During functional activity Standing balance-Leahy Scale: Poor Standing balance comment: requires UE support             High level balance activites: Head turns;Direction changes;Other (comment) High Level Balance Comments: weaving of gait and start/stop with horizontal and vertical head turns, reports increased dizziness. Increased time to transition between fast/slow walking and to change directions 90 deg            Cognition Arousal/Alertness: Awake/alert Behavior During Therapy: WFL for tasks assessed/performed Overall Cognitive Status: Impaired/Different from baseline Area of Impairment: Memory;Following commands;Safety/judgement;Problem solving                     Memory: Decreased short-term memory Following Commands: Follows one step commands consistently;Follows multi-step commands  with increased  time Safety/Judgement: Decreased awareness of safety Awareness: Emergent Problem Solving: Difficulty sequencing;Requires verbal cues          Exercises      General Comments        Pertinent Vitals/Pain Pain Assessment: 0-10 Pain Score: 6  Faces Pain Scale: Hurts a little bit Pain Location: head "comes and goes" Pain Descriptors / Indicators: Headache Pain Intervention(s): Limited activity within patient's tolerance;Monitored during session    Home Living                          Prior Function            PT Goals (current goals can now be found in the care plan section) Acute Rehab PT Goals Patient Stated Goal: to get better PT Goal Formulation: With patient Time For Goal Achievement: 03/31/21 Potential to Achieve Goals: Good Progress towards PT goals: Progressing toward goals    Frequency    Min 4X/week      PT Plan Current plan remains appropriate    Co-evaluation              AM-PAC PT "6 Clicks" Mobility   Outcome Measure  Help needed turning from your back to your side while in a flat bed without using bedrails?: A Little Help needed moving from lying on your back to sitting on the side of a flat bed without using bedrails?: A Little Help needed moving to and from a bed to a chair (including a wheelchair)?: A Little Help needed standing up from a chair using your arms (e.g., wheelchair or bedside chair)?: A Little Help needed to walk in hospital room?: A Little Help needed climbing 3-5 steps with a railing? : A Lot 6 Click Score: 17    End of Session Equipment Utilized During Treatment: Gait belt Activity Tolerance: Patient tolerated treatment well Patient left: in chair;with call bell/phone within reach;with nursing/sitter in room Nurse Communication: Mobility status PT Visit Diagnosis: Unsteadiness on feet (R26.81);Muscle weakness (generalized) (M62.81);Other abnormalities of gait and mobility (R26.89);Other symptoms and signs  involving the nervous system (R29.898)     Time: 1638-4536 PT Time Calculation (min) (ACUTE ONLY): 25 min  Charges:  $Gait Training: 8-22 mins                     Stacie Glaze, PT DPT Acute Rehabilitation Services Pager [phone]  Office [phone]    [place] 03/21/2021, 2:19 PM

## 2021-03-21 NOTE — Progress Notes (Signed)
  NEUROSURGERY PROGRESS NOTE   No issues overnight. Unchanged HA, otherwise doing well.  EXAM:  BP (!) 150/74   Pulse 78   Temp 99.7 F (37.6 C) (Axillary)   Resp 17   LMP 08/28/2016 (Exact Date) Comment: come back on 09/15/2016  SpO2 100%   Awake, alert, oriented  Speech fluent, appropriate  CN grossly intact  5/5 BUE/BLE  EVD in place, patent. Bloody CSF  IMPRESSION:  55 y.o. female SAH d# 6, initial angio negative. Neurologically well. Communicating HCP with EVD  PLAN: - Cont EVD drainage @ 33mmHg. Will begin to wean tomorrow - Cont [place].  - OOB as tolerated with PT/OT - Plan on angio monday    Consuella Lose, MD [facility]

## 2021-03-21 NOTE — Progress Notes (Signed)
Speech Language Pathology Treatment: Cognitive-Linquistic  Patient Details Name: Morenike Cuff MRN: [id number] DOB: 03-26-1966 Today's Date: 03/21/2021 Time: 9983-3825 SLP Time Calculation (min) (ACUTE ONLY): 22 min  Assessment / Plan / Recommendation Clinical Impression  Pt was seen for therapy targeting cognitive goals. Pt currently performing more accurately on verbal tasks than functional tasks and SLP provided scheduling worksheet to target thought organization and sequencing. Pt read through stimulus paragraph however required cues to apply information from paragraph to scheduling task. SLP provided verbal cues and broke paragraph down into individual sentences for pt to reach 100% accuracy. Pt reported getting confused when all information was presented at one time, but reported feeling more confident when given information in smaller chunks. SLP will continue to follow pt for therapy targeting thought organization and executive functioning.    HPI HPI: 55 y.o. female presenting to the hospital complaining of worst headache found to have [place] with change in mental status 10/29 s/p ventriculostomy drain. Medical hx includes HTN, thyroid disease.      SLP Plan  Continue with current plan of care      Recommendations for follow up therapy are one component of a multi-disciplinary discharge planning process, led by the attending physician.  Recommendations may be updated based on patient status, additional functional criteria and insurance authorization.    Recommendations                   General recommendations: Rehab consult Oral Care Recommendations: Oral care BID Follow up Recommendations: Inpatient Rehab SLP Visit Diagnosis: Aphasia (R47.01);Cognitive communication deficit [phone]) Plan: Continue with current plan of care       GO             Dewitt Rota, SLP-Student    Dewitt Rota  03/21/2021, 3:08 PM

## 2021-03-22 ENCOUNTER — Inpatient Hospital Stay (HOSPITAL_COMMUNITY): Payer: BC Managed Care – PPO

## 2021-03-22 DIAGNOSIS — I609 Nontraumatic subarachnoid hemorrhage, unspecified: Secondary | ICD-10-CM

## 2021-03-22 NOTE — Progress Notes (Signed)
Occupational Therapy Treatment Patient Details Name: Amanda Henry MRN: [id number] DOB: April 30, 1966 Today's Date: 03/22/2021   History of present illness This 55 y.o. female admitted with sudden onset severe HA.  She was found to have [place].  Pt with increased lethargy and decreased responsiveness 10/30. she was found to have developing hydrocephalus and underwent EVD placement.  PMH includes:  anxiety, depression, HTN, parathyroid dysfunction   OT comments  Patient continues to make progress towards goals in skilled OT session. Patient's session encompassed functional mobility and cognitive tasks in order to promote previous level of independence. Pt with increased lethargy and flat affect in session, often nodding off during the session while supine or seated EOB. Pt with increased difficulty in expressive language and often needing a choice of two to articulate (when prompted for lunch order or tasks requiring minimal complexity). Pt continued to report dizziness in sitting (140/91 BP) and noted increased restless behavior as evidenced by picking at clothes and drain during session. Patient is easily redirected, but required multi-modal cues to motor plan sit<>stand and transitioning to chair in session. Therapy continues to recommend CIR placement.   Recommendations for follow up therapy are one component of a multi-disciplinary discharge planning process, led by the attending physician.  Recommendations may be updated based on patient status, additional functional criteria and insurance authorization.    Follow Up Recommendations  Acute inpatient rehab (3hours/day)    Assistance Recommended at Discharge Frequent or constant Supervision/Assistance  Equipment Recommendations  None recommended by OT    Recommendations for Other Services Rehab consult    Precautions / Restrictions Precautions Precautions: Fall;Other (comment) Precaution Comments: SBP <160, EVD (clamped during session by  RN) Restrictions Weight Bearing Restrictions: No       Mobility Bed Mobility Overal bed mobility: Needs Assistance Bed Mobility: Supine to Sit     Supine to sit: Min guard;HOB elevated     General bed mobility comments: for safety, increased time and managing lines for pt, one side loss of balance in session sitting EOB, but patient able to self-correct    Transfers Overall transfer level: Needs assistance Equipment used: Rolling walker (2 wheels) Transfers: Sit to/from Stand Sit to Stand: Min assist           General transfer comment: increased cues needed to hand placement, extra time required, and noted to drift to the R when ambulating, also required step by step cues to motor plan sitting in chair     Balance Overall balance assessment: Needs assistance Sitting-balance support: Feet supported Sitting balance-Leahy Scale: Fair Sitting balance - Comments: one side LOB but able to self correct   Standing balance support: Bilateral upper extremity supported;During functional activity Standing balance-Leahy Scale: Poor Standing balance comment: requires UE support                           ADL either performed or assessed with clinical judgement   ADL Overall ADL's : Needs assistance/impaired                 Upper Body Dressing : Moderate assistance;Sitting   Lower Body Dressing: Moderate assistance;Sit to/from stand Lower Body Dressing Details (indicate cue type and reason): decreased standing and sitting balance in session to date Toilet Transfer: Minimal assistance;Rolling walker (2 wheels);Cueing for safety;Cueing for sequencing Toilet Transfer Details (indicate cue type and reason): simulated with transfer to chair, noted to drift to the R with ambulation  Functional mobility during ADLs: Minimal assistance;Cueing for safety;Cueing for sequencing;Rolling walker (2 wheels) General ADL Comments: drifted to R with ambulation,  decreased expressive language abilities in session     Vision       Perception     Praxis      Cognition Arousal/Alertness: Lethargic Behavior During Therapy: Flat affect;Restless Overall Cognitive Status: Impaired/Different from baseline Area of Impairment: Memory;Following commands;Safety/judgement;Problem solving                   Current Attention Level: Selective Memory: Decreased short-term memory Following Commands: Follows one step commands inconsistently Safety/Judgement: Decreased awareness of safety Awareness: Emergent Problem Solving: Difficulty sequencing;Requires verbal cues;Decreased initiation;Slow processing;Requires tactile cues General Comments: Lethargic and noted to begin to "pick" at clothes and drain, easily redirected however increased difficulty following one step commands throughout session, significant extra time needed          Exercises     Shoulder Instructions       General Comments      Pertinent Vitals/ Pain       Pain Assessment: Faces Faces Pain Scale: Hurts a little bit Pain Location: headache Pain Descriptors / Indicators: Headache;Grimacing Pain Intervention(s): Limited activity within patient's tolerance;Monitored during session;Premedicated before session  Home Living                                          Prior Functioning/Environment              Frequency  Min 3X/week        Progress Toward Goals  OT Goals(current goals can now be found in the care plan section)  Progress towards OT goals: Progressing toward goals  Acute Rehab OT Goals Patient Stated Goal: did not state OT Goal Formulation: With patient Time For Goal Achievement: 04/01/21 Potential to Achieve Goals: [place] Discharge plan remains appropriate    Co-evaluation                 AM-PAC OT "6 Clicks" Daily Activity     Outcome Measure   Help from another person eating meals?: None Help from another  person taking care of personal grooming?: A Little Help from another person toileting, which includes using toliet, bedpan, or urinal?: A Lot Help from another person bathing (including washing, rinsing, drying)?: A Lot Help from another person to put on and taking off regular upper body clothing?: A Lot Help from another person to put on and taking off regular lower body clothing?: A Little 6 Click Score: 16    End of Session Equipment Utilized During Treatment: Gait belt;Rolling walker (2 wheels)  OT Visit Diagnosis: Unsteadiness on feet (R26.81);Cognitive communication deficit (R41.841);Pain Symptoms and signs involving cognitive functions: Nontraumatic SAH Pain - part of body:  (headache)   Activity Tolerance Patient limited by lethargy;Other (comment) (dizziness)   Patient Left in chair;with call bell/phone within reach;with chair alarm set   Nurse Communication Mobility status        Time: 5643-3295 OT Time Calculation (min): 31 min  Charges: OT General Charges $OT Visit: 1 Visit OT Treatments $Self Care/Home Management : 8-22 mins $Neuromuscular Re-education: 8-22 mins  Corinne Ports E. [place], [place] [facility] [place] 03/22/2021, 12:05 PM

## 2021-03-22 NOTE — Progress Notes (Signed)
Transcranial Doppler   Date POD PCO2 HCT BP   MCA ACA PCA OPHT SIPH VERT Basilar  10/29 [facility]         Right  Left   58  35   -17  *   -31  -17   19  12   20  16    -28  -35   -42        10/31 [facility]         Right  Left    64   *    -18   *    18   -29    *   *    20   16    -30   -39    -57        11/2 [facility]         Right  Left    91   56    -30   *    -38   -43    27   22    51   *    -63   -29    -45       11/4 [facility]            Right  Left    103   22    *   -28    24   *    29   28    44   *    *   *    *                   Right  Left                                                               Right  Left                                                               Right  Left                                                       MCA = Middle Cerebral Artery      OPHT = Opthalmic Artery     BASILAR = Basilar Artery   ACA = Anterior Cerebral Artery     SIPH = Carotid Siphon PCA = Posterior Cerebral Artery   VERT = Verterbral Artery                    Normal MCA = 62+\-12 ACA = 50+\-12 PCA = 42+\-23    RT Lindegaard = 2.58  LT Lindegaard = * This exam was limited due to poor patient cooperation.    Rea Reser Dejon Lukas 03/22/2021 10:06 AM

## 2021-03-22 NOTE — Progress Notes (Signed)
  NEUROSURGERY PROGRESS NOTE   No issues overnight. Unchanged minor HA.  EXAM:  BP (!) 173/87   Pulse 85   Temp 99.7 F (37.6 C) (Oral)   Resp (!) 24   LMP 08/28/2016 (Exact Date) Comment: come back on 09/15/2016  SpO2 98%   Awake, alert, oriented  Speech fluent, appropriate  CN grossly intact  5/5 BUE/BLE  EVD in place, patent with bloody CSF - 240cc overnight  IMPRESSION:  55 y.o. female [place] d# 7 angio negative. Remains neurologically stable Communicating HCP, EVD in place  PLAN: - Will raise EVD to 63mmHg today, monitor exam - Cont Nimotop - TCD today - Confirmatory angiogram schedule Monday 8:30a, NPO p MN on Sunday   Consuella Lose, MD [facility]

## 2021-03-22 NOTE — Progress Notes (Signed)
PT Cancellation Note  Patient Details Name: Amanda Henry MRN: [id number] DOB: December 29, 1965   Cancelled Treatment:    Reason Eval/Treat Not Completed: Medical issues which prohibited therapy Currently SBP outside of parameters. On arrival, SBP 170s and with recheck 168. On scheduled BP check, BP increased to 180s within 1-2 minutes. RN in to give IV meds but will need time to work. PT will re-attempt at later date.   Rashun Grattan A. Gilford Rile PT, DPT Acute Rehabilitation Services Pager [phone] Office [phone]    [name] 03/22/2021, 5:07 PM

## 2021-03-23 MED ORDER — ALPRAZOLAM ER 1 MG PO TB24: 1.0000 mg | ORAL_TABLET | Freq: Every day | ORAL | Status: DC

## 2021-03-23 MED ORDER — CLONIDINE HCL 0.1 MG PO TABS
0.1000 mg | ORAL_TABLET | Freq: Three times a day (TID) | ORAL | Status: DC
  Administered 2021-03-23 – 2021-03-24 (×6): 0.1 mg via ORAL
  Filled 2021-03-23 (×7): qty 1

## 2021-03-23 MED ORDER — ALPRAZOLAM 0.5 MG PO TABS
1.0000 mg | ORAL_TABLET | Freq: Every evening | ORAL | Status: DC | PRN
  Administered 2021-03-23 – 2021-03-29 (×4): 1 mg via ORAL
  Filled 2021-03-23 (×5): qty 2

## 2021-03-23 NOTE — Progress Notes (Signed)
Overall stable.  Patient with moderate headache.  No new neurologic symptoms otherwise.  Remains hypertensive.  Continues to drain with despite drain elevation.  Awake and alert.  Oriented and appropriate.  Speech fluent.  His motor and sensory intact.  Chest and abdomen benign.  Overall stable.  Continue EVD.  Add clonidine for blood pressure control.  Plan for angiogram on Monday.

## 2021-03-23 NOTE — Progress Notes (Signed)
Physical Therapy Treatment Patient Details Name: Amanda Henry MRN: [id number] DOB: March 20, 1966 Today's Date: 03/23/2021   History of Present Illness This 55 y.o. female admitted 03/15/21 with sudden onset severe HA.  She was found to have [place].  Pt with increased lethargy and decreased responsiveness 10/30. she was found to have developing hydrocephalus and underwent EVD placement.  PMH includes:  anxiety, depression, HTN, parathyroid dysfunction    PT Comments    Pt a bit lethargic, but I woke her from sleeping. Min assist overall for gait into hallway and still has cognitive deficits.  She remains appropriate for CIR level therapies at discharge.    Recommendations for follow up therapy are one component of a multi-disciplinary discharge planning process, led by the attending physician.  Recommendations may be updated based on patient status, additional functional criteria and insurance authorization.  Follow Up Recommendations  Acute inpatient rehab (3hours/day)     Assistance Recommended at Discharge Frequent or constant Supervision/Assistance  Equipment Recommendations  Rolling walker (2 wheels)    Recommendations for Other Services       Precautions / Restrictions Precautions Precautions: Fall;Other (comment) Precaution Comments: SBP <160, EVD (clamped during session by RN)     Mobility  Bed Mobility Overal bed mobility: Needs Assistance Bed Mobility: Sidelying to Sit;Sit to Sidelying   Sidelying to sit: Min assist     Sit to sidelying: Supervision General bed mobility comments: Min assist to initiate movement to EOB, would not sit up to verbal cues alone.    Transfers Overall transfer level: Needs assistance Equipment used: Rolling walker (2 wheels) Transfers: Sit to/from Stand Sit to Stand: Min assist           General transfer comment: Min assist to stand multiple times to RW.    Ambulation/Gait Ambulation/Gait assistance: Min assist Gait Distance  (Feet): 250 Feet Assistive device: Rolling walker (2 wheels) Gait Pattern/deviations: Step-through pattern;Staggering left;Staggering right     General Gait Details: Min assist for safety and balance.  Difficulty steering RW.   Stairs             Wheelchair Mobility    Modified Rankin (Stroke Patients Only) Modified Rankin (Stroke Patients Only) Pre-Morbid Rankin Score: No symptoms Modified Rankin: Moderately severe disability     Balance Overall balance assessment: Needs assistance Sitting-balance support: Feet supported;No upper extremity supported Sitting balance-Leahy Scale: Fair     Standing balance support: Bilateral upper extremity supported;Single extremity supported;No upper extremity supported Standing balance-Leahy Scale: Poor Standing balance comment: requires UE support and external assist.                            Cognition Arousal/Alertness: Lethargic Behavior During Therapy: Flat affect Overall Cognitive Status: Impaired/Different from baseline Area of Impairment: Attention;Memory;Following commands;Safety/judgement;Awareness;Problem solving                 Orientation Level:  (not specifically tested) Current Attention Level: Sustained Memory: Decreased short-term memory Following Commands: Follows one step commands inconsistently;Follows one step commands with increased time Safety/Judgement: Decreased awareness of safety;Decreased awareness of deficits Awareness: Intellectual Problem Solving: Slow processing;Decreased initiation;Difficulty sequencing;Requires verbal cues;Requires tactile cues General Comments: Pt very slow to process, not speaking much today, one word, short phrases, not always making sense to context, urinary incontinence large volume without warning, asked to sit up and had blank stare.  PT had to initiate movement to EOB.  Did better at initiating movement to stand and walk.  Unable to tell me if her head was  hurting, but kept grabbing it like it did hurt.        Exercises      General Comments General comments (skin integrity, edema, etc.): Pt was nearly out of the door to her room and shouted "I need to pee", she immediately started urinating on the floor.  May be good to take her to the bathroom before walking next session as she does not give much warning.      Pertinent Vitals/Pain Pain Assessment: Faces Faces Pain Scale: Hurts even more Pain Location: headache Pain Descriptors / Indicators: Headache;Grimacing Pain Intervention(s): Limited activity within patient's tolerance;Monitored during session;Repositioned    Home Living                          Prior Function            PT Goals (current goals can now be found in the care plan section) Acute Rehab PT Goals Patient Stated Goal: none stated Progress towards PT goals: Progressing toward goals    Frequency    Min 4X/week      PT Plan Current plan remains appropriate    Co-evaluation              AM-PAC PT "6 Clicks" Mobility   Outcome Measure  Help needed turning from your back to your side while in a flat bed without using bedrails?: A Little Help needed moving from lying on your back to sitting on the side of a flat bed without using bedrails?: A Little Help needed moving to and from a bed to a chair (including a wheelchair)?: A Little Help needed standing up from a chair using your arms (e.g., wheelchair or bedside chair)?: A Little Help needed to walk in hospital room?: A Little Help needed climbing 3-5 steps with a railing? : A Lot 6 Click Score: 17    End of Session Equipment Utilized During Treatment: Gait belt Activity Tolerance: Patient tolerated treatment well Patient left: in bed;with call bell/phone within reach;with bed alarm set Nurse Communication: Mobility status PT Visit Diagnosis: Unsteadiness on feet (R26.81);Muscle weakness (generalized) (M62.81);Other abnormalities of  gait and mobility (R26.89);Other symptoms and signs involving the nervous system (R29.898)     Time: 0626-9485 PT Time Calculation (min) (ACUTE ONLY): 25 min  Charges:  $Gait Training: 8-22 mins $Therapeutic Activity: 8-22 mins                    Verdene Lennert, PT, DPT  Acute Rehabilitation Ortho Tech Supervisor [phone] pager [phone]) [phone] office

## 2021-03-24 NOTE — Progress Notes (Signed)
Overall stable.  Patient's headache somewhat improved.  Blood pressure management better on clonidine although she is a little bit more sedated from this medicine.  She awakens easily.  Her speech is fluent.  Her motor and sensory function are intact.  Her ventriculostomy continues to function well.  Status post subarachnoid hemorrhage of unclear etiology.  Plan for arteriography tomorrow.  No new change in plan.

## 2021-03-25 ENCOUNTER — Inpatient Hospital Stay (HOSPITAL_COMMUNITY): Payer: BC Managed Care – PPO

## 2021-03-25 ENCOUNTER — Other Ambulatory Visit (HOSPITAL_COMMUNITY): Payer: Self-pay

## 2021-03-25 DIAGNOSIS — I609 Nontraumatic subarachnoid hemorrhage, unspecified: Secondary | ICD-10-CM

## 2021-03-25 HISTORY — PX: IR ANGIO INTRA EXTRACRAN SEL INTERNAL CAROTID BILAT MOD SED: IMG5363

## 2021-03-25 HISTORY — PX: IR TRANSCATH/EMBOLIZ: IMG695

## 2021-03-25 HISTORY — PX: IR ANGIO INTRA EXTRACRAN SEL INTERNAL CAROTID UNI R MOD SED: IMG5362

## 2021-03-25 MED ORDER — LIDOCAINE HCL 1 % IJ SOLN
INTRAMUSCULAR | Status: AC
  Administered 2021-03-25: 10 mL
  Filled 2021-03-25: qty 20

## 2021-03-25 MED ORDER — HEPARIN SODIUM (PORCINE) 1000 UNIT/ML IJ SOLN
INTRAMUSCULAR | Status: AC | PRN
  Administered 2021-03-25: 2000 [IU] via INTRAVENOUS

## 2021-03-25 MED ORDER — FENTANYL CITRATE (PF) 100 MCG/2ML IJ SOLN
INTRAMUSCULAR | Status: AC
  Filled 2021-03-25: qty 2

## 2021-03-25 MED ORDER — FENTANYL CITRATE (PF) 100 MCG/2ML IJ SOLN
INTRAMUSCULAR | Status: AC | PRN
  Administered 2021-03-25: 25 ug via INTRAVENOUS

## 2021-03-25 MED ORDER — METOPROLOL TARTRATE 25 MG PO TABS
25.0000 mg | ORAL_TABLET | Freq: Every day | ORAL | Status: DC
  Administered 2021-03-25 – 2021-04-02 (×9): 25 mg via ORAL
  Filled 2021-03-25 (×9): qty 1

## 2021-03-25 MED ORDER — HYDRALAZINE HCL 20 MG/ML IJ SOLN
INTRAMUSCULAR | Status: AC
  Filled 2021-03-25: qty 1

## 2021-03-25 MED ORDER — HYDRALAZINE HCL 20 MG/ML IJ SOLN
INTRAMUSCULAR | Status: AC | PRN
  Administered 2021-03-25: 5 mg via INTRAVENOUS

## 2021-03-25 MED ORDER — MIDAZOLAM HCL 2 MG/2ML IJ SOLN
INTRAMUSCULAR | Status: AC
  Filled 2021-03-25: qty 2

## 2021-03-25 MED ORDER — HEPARIN SODIUM (PORCINE) 1000 UNIT/ML IJ SOLN
INTRAMUSCULAR | Status: AC
  Filled 2021-03-25: qty 1

## 2021-03-25 MED ORDER — IOHEXOL 300 MG/ML  SOLN: 100.0000 mL | Freq: Once | INTRAMUSCULAR | Status: DC | PRN

## 2021-03-25 MED ORDER — MIDAZOLAM HCL 2 MG/2ML IJ SOLN
INTRAMUSCULAR | Status: AC | PRN
  Administered 2021-03-25: 1 mg via INTRAVENOUS

## 2021-03-25 NOTE — Brief Op Note (Signed)
  NEUROSURGERY BRIEF OPERATIVE  NOTE   PREOP DX: Subarachnoid Hemorrhage  POSTOP DX: Same  PROCEDURE: Diagnostic cerebral angiogram  SURGEON: Dr. Consuella Lose, MD  ANESTHESIA: IV Sedation with Local  EBL: Minimal  SPECIMENS: None  COMPLICATIONS: None  CONDITION: Stable to recovery  FINDINGS (Full report in [facility]): 1. 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 No other aneurysms, AVM, or fistulas 3. No vasospasm   Consuella Lose, MD [facility]

## 2021-03-25 NOTE — Progress Notes (Signed)
  NEUROSURGERY PROGRESS NOTE   No issues overnight. Unchanged minor HA.  EXAM:  BP 132/76   Pulse (!) 57   Temp 98.2 F (36.8 C) (Oral)   Resp 17   Wt 83.9 kg   LMP 08/28/2016 (Exact Date) Comment: come back on 09/15/2016  SpO2 100%   BMI 27.64 kg/m   Awake, alert, oriented  Speech fluent, appropriate  CN grossly intact  5/5 BUE/BLE  EVD in place, patent with bloody CSF - 150cc overnight  IMPRESSION:  54 y.o. female [place] d# 10 angio negative. Remains neurologically stable Communicating HCP, EVD in place  PLAN: - Will clamp EVD today, monitor exam - Cont Nimotop - TCD today - Confirmatory angiogram this pm - d/c clonidine d/t drowsiness, will add metoprolol   Consuella Lose, MD [facility]

## 2021-03-25 NOTE — Sedation Documentation (Signed)
5 Fr Exoseal deployed

## 2021-03-25 NOTE — Sedation Documentation (Signed)
Groin site assessed with nick RN at the bedside. Clean, dry and intact. No drainage noted. Soft to touch, no hematoma noted. +2 palpable distal pulses. 5 fr exoseal closure device, knee immobilizer in place.

## 2021-03-25 NOTE — Progress Notes (Signed)
Received verbal order from Dr. Kathyrn Sheriff to discontinue to scheduled PO clonidine and replace it was 25mg  metoprolol po once daily.

## 2021-03-25 NOTE — Progress Notes (Signed)
PT Cancellation Note  Patient Details Name: Ladeana Laplant MRN: [id number] DOB: 04-14-1966   Cancelled Treatment:    Reason Eval/Treat Not Completed: Patient at procedure or test/unavailable.  Per RN, pt is in IR.  PT to check back tomorrow.  Thanks,  Verdene Lennert, PT, DPT  Acute Rehabilitation Ortho Tech Supervisor [phone] pager #[phone] office      Wells Guiles B Noralyn Karim 03/25/2021, 3:27 PM

## 2021-03-25 NOTE — Progress Notes (Signed)
Transcranial Doppler   Date POD PCO2 HCT BP   MCA ACA PCA OPHT SIPH VERT Basilar  10/29 [facility]         Right  Left   58  35   -17  *   -31  -17   19  12   20  16    -28  -35   -42        10/31 [facility]         Right  Left    64   *    -18   *    18   -29    *   *    20   16    -30   -39    -57        11/2 [facility]         Right  Left    91   56    -30   *    -38   -43    27   22    51   *    -63   -29    -45         11/7 CK          Right  Left    *   *    *   *    *   29    28   *    *   *    24   -54    -55.7                   Right  Left                                                               Right  Left                                                               Right  Left                                                      *unable to insonate MCA = Middle Cerebral Artery      OPHT = Opthalmic Artery     BASILAR = Basilar Artery   ACA = Anterior Cerebral Artery     SIPH = Carotid Siphon PCA = Posterior Cerebral Artery   VERT = Verterbral Artery                    Normal MCA = 62+\-12 ACA = 50+\-12 PCA = 42+\-23    RT Lindegaard =  2.84  LT Lindegaard = 1.65

## 2021-03-25 NOTE — Progress Notes (Signed)
Occupational Therapy Treatment Patient Details Name: Amanda Henry MRN: [id number] DOB: 25-Feb-1966 Today's Date: 03/25/2021   History of present illness This 55 y.o. female admitted 03/15/21 with sudden onset severe HA.  She was found to have [place].  Pt with increased lethargy and decreased responsiveness 10/30. she was found to have developing hydrocephalus and underwent EVD placement.  PMH includes:  anxiety, depression, HTN, parathyroid dysfunction   OT comments  This 55 yo female seen today to focus on transfers, ADLs, and cognition. Pt doing better than on eval, but still struggling with balance and cognition both impacting her safety and independence with basic ADls, IADLs, driving and returning to work. Pt will continue to benefit from acute OT with follow up on CIR to get back to an independent level for all of these.   Recommendations for follow up therapy are one component of a multi-disciplinary discharge planning process, led by the attending physician.  Recommendations may be updated based on patient status, additional functional criteria and insurance authorization.    Follow Up Recommendations  Acute inpatient rehab (3hours/day)    Assistance Recommended at Discharge Frequent or constant Supervision/Assistance  Equipment Recommendations  None recommended by OT    Recommendations for Other Services Rehab consult    Precautions / Restrictions Precautions Precautions: Fall Precaution Comments: SBP <160, EVD clamped prior to entering room per MD note Restrictions Weight Bearing Restrictions: No       Mobility Bed Mobility Overal bed mobility: Needs Assistance Bed Mobility: Supine to Sit;Sit to Supine     Supine to sit: Supervision;HOB elevated Sit to supine: Supervision               Balance Overall balance assessment: Needs assistance Sitting-balance support: No upper extremity supported;Feet supported Sitting balance-Leahy Scale: Good Sitting balance -  Comments: can sit EOB and cross legs to get to socks without LOB   Standing balance support: No upper extremity supported;During functional activity Standing balance-Leahy Scale: Fair Standing balance comment: standing at sink to wash face                           ADL either performed or assessed with clinical judgement   ADL Overall ADL's : Needs assistance/impaired     Grooming: Wash/dry face;Min guard;Standing                 Lower Body Dressing Details (indicate cue type and reason): Can get to feet by crossing legs over one another aleternately at knee Toilet Transfer: Minimal assistance;Rolling walker (2 wheels);Cueing for safety Toilet Transfer Details (indicate cue type and reason): simulated (bed>to bathroom to stand sink to wash face>to door and back to sit EOB                Extremity/Trunk Assessment Upper Extremity Assessment Upper Extremity Assessment: Overall WFL for tasks assessed            Vision Patient Visual Report: No change from baseline            Cognition Arousal/Alertness: Awake/alert Behavior During Therapy: Flat affect Overall Cognitive Status: Impaired/Different from baseline Area of Impairment: Attention;Memory;Following commands;Awareness;Problem solving                   Current Attention Level: Sustained Memory: Decreased short-term memory Following Commands: Follows one step commands inconsistently   Awareness: Intellectual Problem Solving: Slow processing;Difficulty sequencing;Requires verbal cues;Requires tactile cues General Comments: Pt slow to process at times, 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                     Pertinent Vitals/ Pain       Pain Assessment: 0-10 Pain Score: 8  Pain Location: headache Pain Descriptors / Indicators: Headache;Aching Pain Intervention(s):  Limited activity within patient's tolerance;Monitored during session;Patient requesting pain meds-RN notified;RN gave pain meds during session         Frequency  Min 3X/week        Progress Toward Goals  OT Goals(current goals can now be found in the care plan section)  Progress towards OT goals: Progressing toward goals  Acute Rehab OT Goals Patient Stated Goal: for headache to get better and be able to go to rehab OT Goal Formulation: With patient/family Time For Goal Achievement: 04/01/21 Potential to Achieve Goals: Good  Plan Discharge plan remains appropriate       AM-PAC OT "6 Clicks" Daily Activity     Outcome Measure   Help from another person eating meals?: None Help from another person taking care of personal grooming?: A Little Help from another person toileting, which includes using toliet, bedpan, or urinal?: A Little Help from another person bathing (including washing, rinsing, drying)?: A Little Help from another person to put on and taking off regular upper body clothing?: A Little Help from another person to put on and taking off regular lower body clothing?: A Little 6 Click Score: 19    End of Session Equipment Utilized During Treatment: Gait belt;Rolling walker (2 wheels)  OT Visit Diagnosis: Unsteadiness on feet (R26.81);Cognitive communication deficit (R41.841);Pain Symptoms and signs involving cognitive functions: Nontraumatic SAH Pain - part of body:  (headache)   Activity Tolerance Patient tolerated treatment well   Patient Left in bed;with call bell/phone within reach;with bed alarm set;with family/visitor present   Nurse Communication Mobility status        Time: 1351-1420 OT Time Calculation (min): 29 min  Charges: OT General Charges $OT Visit: 1 Visit OT Treatments $Self Care/Home Management : 23-37 mins  [place]en Circle, OTR/L Acute [employer] Pager [phone] Office [phone]    Almon Register 03/25/2021,  4:09 PM

## 2021-03-25 NOTE — Progress Notes (Signed)
Transported patient down to IR without any complications.  Gave report to [facility].

## 2021-03-26 ENCOUNTER — Other Ambulatory Visit (HOSPITAL_COMMUNITY): Payer: BC Managed Care – PPO

## 2021-03-26 ENCOUNTER — Encounter (HOSPITAL_COMMUNITY): Payer: Self-pay | Admitting: Radiology

## 2021-03-26 ENCOUNTER — Inpatient Hospital Stay (HOSPITAL_COMMUNITY): Payer: BC Managed Care – PPO

## 2021-03-26 ENCOUNTER — Inpatient Hospital Stay (HOSPITAL_COMMUNITY): Payer: BC Managed Care – PPO | Admitting: Anesthesiology

## 2021-03-26 ENCOUNTER — Other Ambulatory Visit (HOSPITAL_COMMUNITY): Payer: Self-pay | Admitting: Radiology

## 2021-03-26 ENCOUNTER — Encounter (HOSPITAL_COMMUNITY)
Admission: EM | Disposition: A | Discharge status: Rehabilitation Facility | Payer: Self-pay | Source: Home / Self Care | Attending: Neurosurgery

## 2021-03-26 ENCOUNTER — Other Ambulatory Visit (HOSPITAL_COMMUNITY): Payer: Self-pay

## 2021-03-26 HISTORY — PX: RADIOLOGY WITH ANESTHESIA: SHX6223

## 2021-03-26 HISTORY — PX: IR US GUIDE VASC ACCESS RIGHT: IMG2390

## 2021-03-26 HISTORY — PX: IR ANGIO INTRA EXTRACRAN SEL INTERNAL CAROTID UNI R MOD SED: IMG5362

## 2021-03-26 HISTORY — PX: IR ANGIO INTRA EXTRACRAN SEL COM CAROTID INNOMINATE UNI L MOD SED: IMG5358

## 2021-03-26 HISTORY — PX: IR ANGIOGRAM FOLLOW UP STUDY: IMG697

## 2021-03-26 HISTORY — PX: IR TRANSCATH/EMBOLIZ: IMG695

## 2021-03-26 HISTORY — PX: IR ANGIO VERTEBRAL SEL VERTEBRAL UNI L MOD SED: IMG5367

## 2021-03-26 LAB — CBC
HCT: 42.8 % (ref 36.0–46.0)
Hemoglobin: 13.9 g/dL (ref 12.0–15.0)
MCH: 28.7 pg (ref 26.0–34.0)
MCHC: 32.5 g/dL (ref 30.0–36.0)
MCV: 88.4 fL (ref 80.0–100.0)
Platelets: 471 10*3/uL — ABNORMAL HIGH (ref 150–400)
RBC: 4.84 MIL/uL (ref 3.87–5.11)
RDW: 13.4 % (ref 11.5–15.5)
WBC: 9.9 10*3/uL (ref 4.0–10.5)
nRBC: 0 % (ref 0.0–0.2)

## 2021-03-26 LAB — BASIC METABOLIC PANEL
Anion gap: 11 (ref 5–15)
BUN: 15 mg/dL (ref 6–20)
CO2: 25 mmol/L (ref 22–32)
Calcium: >15 mg/dL (ref 8.9–10.3)
Chloride: 101 mmol/L (ref 98–111)
Creatinine, Ser: 0.95 mg/dL (ref 0.44–1.00)
GFR, Estimated: >60 mL/min (ref >60)
Glucose, Bld: 111 mg/dL — ABNORMAL HIGH (ref 70–99)
Potassium: 2.7 mmol/L — CL (ref 3.5–5.1)
Sodium: 137 mmol/L (ref 135–145)

## 2021-03-26 LAB — TYPE AND SCREEN
ABO/RH(D): O POS
Antibody Screen: NEGATIVE

## 2021-03-26 SURGERY — IR WITH ANESTHESIA
Anesthesia: General

## 2021-03-26 MED ORDER — IOHEXOL 300 MG/ML  SOLN: 100.0000 mL | Freq: Once | INTRAMUSCULAR | Status: DC | PRN

## 2021-03-26 MED ORDER — SUGAMMADEX SODIUM 200 MG/2ML IV SOLN
INTRAVENOUS | Status: DC | PRN
  Administered 2021-03-26: 200 mg via INTRAVENOUS

## 2021-03-26 MED ORDER — CHLORHEXIDINE GLUCONATE 0.12 % MT SOLN
15.0000 mL | OROMUCOSAL | Status: AC
  Administered 2021-03-26: 15 mL via OROMUCOSAL
  Filled 2021-03-26 (×2): qty 15

## 2021-03-26 MED ORDER — FENTANYL CITRATE (PF) 100 MCG/2ML IJ SOLN
INTRAMUSCULAR | Status: AC
  Administered 2021-03-26: 100 ug via INTRAVENOUS
  Filled 2021-03-26: qty 2

## 2021-03-26 MED ORDER — PROPOFOL 10 MG/ML IV BOLUS
INTRAVENOUS | Status: DC | PRN
  Administered 2021-03-26: 120 mg via INTRAVENOUS

## 2021-03-26 MED ORDER — PHENYLEPHRINE HCL-NACL 20-0.9 MG/250ML-% IV SOLN
INTRAVENOUS | Status: DC | PRN
  Administered 2021-03-26: 20 ug/min via INTRAVENOUS

## 2021-03-26 MED ORDER — SODIUM CHLORIDE 0.9% FLUSH: 10.0000 mL | INTRAVENOUS | Status: DC | PRN

## 2021-03-26 MED ORDER — PROPOFOL 10 MG/ML IV BOLUS
INTRAVENOUS | Status: AC
  Filled 2021-03-26: qty 20

## 2021-03-26 MED ORDER — ROCURONIUM BROMIDE 10 MG/ML (PF) SYRINGE
PREFILLED_SYRINGE | INTRAVENOUS | Status: DC | PRN
Start: 2021-03-26 — End: 2021-03-26
  Administered 2021-03-26: 50 mg via INTRAVENOUS

## 2021-03-26 MED ORDER — HEPARIN SODIUM (PORCINE) 1000 UNIT/ML IJ SOLN
INTRAMUSCULAR | Status: DC | PRN
  Administered 2021-03-26: 2000 [IU] via INTRAVENOUS

## 2021-03-26 MED ORDER — LACTATED RINGERS IV SOLN: INTRAVENOUS | Status: DC | PRN

## 2021-03-26 MED ORDER — FENTANYL CITRATE (PF) 250 MCG/5ML IJ SOLN
INTRAMUSCULAR | Status: AC
  Filled 2021-03-26: qty 5

## 2021-03-26 MED ORDER — MIDAZOLAM HCL 2 MG/2ML IJ SOLN
INTRAMUSCULAR | Status: AC
  Administered 2021-03-26: 1 mg via INTRAVENOUS
  Filled 2021-03-26: qty 2

## 2021-03-26 MED ORDER — MIDAZOLAM HCL 2 MG/2ML IJ SOLN
INTRAMUSCULAR | Status: AC
  Filled 2021-03-26: qty 2

## 2021-03-26 MED ORDER — MIDAZOLAM HCL 2 MG/2ML IJ SOLN: 1.0000 mg | Freq: Once | INTRAMUSCULAR | Status: AC

## 2021-03-26 MED ORDER — SODIUM CHLORIDE 0.9% FLUSH
10.0000 mL | Freq: Two times a day (BID) | INTRAVENOUS | Status: DC
  Administered 2021-03-26: 10 mL
  Administered 2021-03-27: 20 mL
  Administered 2021-03-27 – 2021-03-28 (×2): 10 mL

## 2021-03-26 MED ORDER — POTASSIUM CHLORIDE 10 MEQ/50ML IV SOLN
10.0000 meq | INTRAVENOUS | Status: AC
  Administered 2021-03-26 (×2): 10 meq via INTRAVENOUS
  Filled 2021-03-26 (×3): qty 50

## 2021-03-26 MED ORDER — SODIUM CHLORIDE 0.9 % IV SOLN: INTRAVENOUS | Status: DC

## 2021-03-26 MED ORDER — FENTANYL CITRATE (PF) 100 MCG/2ML IJ SOLN: 100.0000 ug | Freq: Once | INTRAMUSCULAR | Status: AC

## 2021-03-26 MED ORDER — FENTANYL CITRATE (PF) 250 MCG/5ML IJ SOLN
INTRAMUSCULAR | Status: DC | PRN
  Administered 2021-03-26: 100 ug via INTRAVENOUS

## 2021-03-26 MED ORDER — PHENYLEPHRINE 40 MCG/ML (10ML) SYRINGE FOR IV PUSH (FOR BLOOD PRESSURE SUPPORT)
PREFILLED_SYRINGE | INTRAVENOUS | Status: DC | PRN
Start: 2021-03-26 — End: 2021-03-26
  Administered 2021-03-26: 120 ug via INTRAVENOUS
  Administered 2021-03-26 (×3): 80 ug via INTRAVENOUS

## 2021-03-26 MED ORDER — POTASSIUM CHLORIDE 10 MEQ/50ML IV SOLN
10.0000 meq | INTRAVENOUS | Status: AC
  Administered 2021-03-26 (×2): 10 meq via INTRAVENOUS
  Filled 2021-03-26: qty 50

## 2021-03-26 NOTE — Sedation Documentation (Signed)
Right femoral sheath removed. 7Fr. Exoseal deployed

## 2021-03-26 NOTE — Sedation Documentation (Signed)
Bedside report given. Dressing to right groin and pulses checked.

## 2021-03-26 NOTE — Progress Notes (Signed)
SLP Cancellation Note  Patient Details Name: Amanda Henry MRN: [id number] DOB: 02/28/1966   Cancelled treatment:        Pt currently in procedure. Will continue efforts   Houston Siren 03/26/2021, 1:05 PM  Orbie Pyo Colvin Caroli.Ed Actor Pager [phone] Office [phone] s

## 2021-03-26 NOTE — Transfer of Care (Signed)
Immediate Anesthesia Transfer of Care Note  Patient: [name]  Procedure(s) Performed: IR WITH ANESTHESIA (Coiling and Embolization)  Patient Location: ICU  Anesthesia Type:General  Level of Consciousness: awake and alert   Airway & Oxygen Therapy: Patient Spontanous Breathing and Patient connected to face mask oxygen  Post-op Assessment: Report given to RN and Post -op Vital signs reviewed and stable  Post vital signs: Reviewed and stable  Last Vitals:  Vitals Value Taken Time  BP 169/92 03/26/21 1430  Temp    Pulse 62 03/26/21 1439  Resp 8 03/26/21 1439  SpO2 97 % 03/26/21 1439  Vitals shown include unvalidated device data.  Last Pain:  Vitals:   03/26/21 1113  TempSrc: Oral  PainSc:          Complications: No notable events documented.

## 2021-03-26 NOTE — Brief Op Note (Signed)
  NEUROSURGERY BRIEF OPERATIVE  NOTE   PREOP DX: Subarachnoid Hemorrhage  POSTOP DX: Same  PROCEDURE: Diagnostic cerebral angiogram  SURGEON: Dr. Consuella Lose, MD  ANESTHESIA: GETA  EBL: Minimal  SPECIMENS: None  COMPLICATIONS: None  CONDITION: Stable to recovery  FINDINGS (Full report in [facility]): 1. 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

## 2021-03-26 NOTE — Progress Notes (Signed)
Received orders from Dr. Kathyrn Sheriff to go ahead and give prn IV hydralazine early and to remove arterial line.

## 2021-03-26 NOTE — Anesthesia Procedure Notes (Signed)
Central Venous Catheter Insertion Performed by: Albertha Ghee, MD, anesthesiologist Start/End11/12/2020 12:04 PM, 03/26/2021 12:15 AM Patient location: Pre-op. Preanesthetic checklist: patient identified, IV checked, site marked, risks and benefits discussed, surgical consent, monitors and equipment checked, pre-op evaluation, timeout performed and anesthesia consent Position: Trendelenburg Lidocaine 1% used for infiltration and patient sedated Hand hygiene performed , maximum sterile barriers used  and Seldinger technique used Catheter size: 7 Fr Central line was placed.Double lumen Procedure performed using ultrasound guided technique. Ultrasound Notes:anatomy identified, needle tip was noted to be adjacent to the nerve/plexus identified, no ultrasound evidence of intravascular and/or intraneural injection and image(s) printed for medical record Attempts: 1 Following insertion, line sutured, dressing applied and Biopatch. Post procedure assessment: blood return through all ports, free fluid flow and no air  Patient tolerated the procedure well with no immediate complications.

## 2021-03-26 NOTE — Anesthesia Procedure Notes (Signed)
Procedure Name: Intubation Date/Time: 03/26/2021 12:51 PM Performed by: Alain Marion, CRNA Pre-anesthesia Checklist: Patient identified, Emergency Drugs available, Suction available and Patient being monitored Patient Re-evaluated:Patient Re-evaluated prior to induction Oxygen Delivery Method: Circle System Utilized Preoxygenation: Pre-oxygenation with 100% oxygen Induction Type: IV induction Ventilation: Mask ventilation without difficulty Laryngoscope Size: Miller and 2 Tube type: Oral Tube size: 7.0 mm Number of attempts: 1 Airway Equipment and Method: Stylet and Oral airway Placement Confirmation: ETT inserted through vocal cords under direct vision, positive ETCO2 and breath sounds checked- equal and bilateral Secured at: 21 cm Tube secured with: Tape Dental Injury: Teeth and Oropharynx as per pre-operative assessment

## 2021-03-26 NOTE — Progress Notes (Signed)
  NEUROSURGERY PROGRESS NOTE   Pt seen and examined. No issues overnight. Unchanged mild HA, otherwise no new c/o.  EXAM: Temp:  [98 F (36.7 C)-98.9 F (37.2 C)] 98.1 F (36.7 C) (11/08 1113) Pulse Rate:  [61-83] 78 (11/08 1113) Resp:  [6-22] 18 (11/08 1113) BP: (113-175)/(54-108) 134/63 (11/08 1113) SpO2:  [96 %-100 %] 99 % (11/08 1113) Weight:  [83.9 kg] 83.9 kg (11/08 1115) Intake/Output      11/07 0701 11/08 0700 11/08 0701 11/09 0700   I.V. (mL/kg)     Total Intake(mL/kg)     Urine (mL/kg/hr) 1050 (0.5)    Drains 73    Stool 0    Total Output 1123    Net -1123         Urine Occurrence 2 x    Stool Occurrence 2 x     Awake, alert, oriented CN intact Speech fluent MAE well EVD in place, clamped  LABS: Lab Results  Component Value Date   CREATININE 0.71 03/15/2021   BUN 8 03/15/2021   NA 142 03/15/2021   K 3.1 (L) 03/15/2021   CL 106 03/15/2021   CO2 25 03/15/2021   Lab Results  Component Value Date   WBC 13.1 (H) 03/15/2021   HGB 13.6 03/15/2021   HCT 40.0 03/15/2021   MCV 89.3 03/15/2021   PLT 306 03/15/2021    IMPRESSION: - 55 y.o. female SAH d#11 with f/u angiogram revealing right AChor aneurysm. Communicating HCP, doing well over 24hrs with EVD clamped  PLAN: - Proceed with treatment of aneurysm today - likely endovascular coil embolization - Keep EVD clamped for now - Cont Nimotop  I have reviewed the imaging findings with the patient and her family at bedside.  With the discovery of the aneurysm I did discuss with him the need for treatment to prevent rehemorrhage.  I reviewed with him the options for treatment including endovascular coiling and surgical clipping.  I did tell them that I believe the aneurysm can be treated with coiling however there is the possibility of need for surgical clipping if this is not feasible.  I reviewed with them the risks of both coiling and clipping procedures to include risk of stroke and or intracranial  hemorrhage leading to weakness, paralysis, coma, or death.  We did also discussed the other risks of arterial dissection, contrast nephropathy, and groin hematoma.  Risks of the surgical clipping also could include infection and CSF leak as well as seizure and worsening hydrocephalus.  All her questions today were answered.  They do wish to proceed with treatment as above.  Patient provided informed consent.  Consuella Lose, MD [facility] [place]   Consuella Lose, MD [facility]

## 2021-03-26 NOTE — Anesthesia Preprocedure Evaluation (Signed)
Anesthesia Evaluation  Patient identified by MRN, date of birth, ID band Patient awake    Reviewed: Allergy & Precautions, H&P , NPO status , Patient's Chart, lab work & pertinent test results  Airway Mallampati: II   Neck ROM: full    Dental   Pulmonary neg pulmonary ROS,    breath sounds clear to auscultation       Cardiovascular hypertension,  Rhythm:regular Rate:Normal     Neuro/Psych PSYCHIATRIC DISORDERS Anxiety Depression Subarachnoid hemorrhage    GI/Hepatic   Endo/Other  Hypothyroidism   Renal/GU      Musculoskeletal   Abdominal   Peds  Hematology   Anesthesia Other Findings   Reproductive/Obstetrics                             Anesthesia Physical Anesthesia Plan  ASA: 2  Anesthesia Plan: General   Post-op Pain Management:    Induction: Intravenous  PONV Risk Score and Plan: 3 and Ondansetron, Dexamethasone and Treatment may vary due to age or medical condition  Airway Management Planned: Oral ETT  Additional Equipment: Arterial line  Intra-op Plan:   Post-operative Plan: Extubation in OR  Informed Consent: I have reviewed the patients History and Physical, chart, labs and discussed the procedure including the risks, benefits and alternatives for the proposed anesthesia with the patient or authorized representative who has indicated his/her understanding and acceptance.     Dental advisory given  Plan Discussed with: CRNA, Anesthesiologist and Surgeon  Anesthesia Plan Comments:         Anesthesia Quick Evaluation

## 2021-03-26 NOTE — Progress Notes (Signed)
PT Cancellation Note  Patient Details Name: Amanda Henry MRN: [id number] DOB: 06-May-1966   Cancelled Treatment:    Reason Eval/Treat Not Completed: Patient at procedure or test/unavailable. Will check back this afternoon as time allows.   Leighton Roach, PT  Acute Rehab Services  Pager [phone] Office [place] 03/26/2021, 11:30 AM

## 2021-03-26 NOTE — Anesthesia Procedure Notes (Signed)
Arterial Line Insertion Start/End11/12/2020 12:00 PM Performed by: Betha Loa, CRNA, CRNA  Patient location: Pre-op. Preanesthetic checklist: patient identified, IV checked, site marked, risks and benefits discussed, surgical consent, monitors and equipment checked, pre-op evaluation, timeout performed and anesthesia consent Left, radial was placed Catheter size: 20 G Hand hygiene performed  and maximum sterile barriers used   Attempts: 1 Procedure performed without using ultrasound guided technique. Following insertion, dressing applied and Biopatch. Post procedure assessment: normal and unchanged  Patient tolerated the procedure well with no immediate complications.

## 2021-03-27 ENCOUNTER — Encounter (HOSPITAL_COMMUNITY): Payer: Self-pay | Admitting: Neurosurgery

## 2021-03-27 ENCOUNTER — Inpatient Hospital Stay (HOSPITAL_COMMUNITY): Payer: BC Managed Care – PPO

## 2021-03-27 DIAGNOSIS — I609 Nontraumatic subarachnoid hemorrhage, unspecified: Secondary | ICD-10-CM

## 2021-03-27 LAB — MAGNESIUM: Magnesium: 2 mg/dL (ref 1.7–2.4)

## 2021-03-27 LAB — BASIC METABOLIC PANEL
Anion gap: 9 (ref 5–15)
BUN: 11 mg/dL (ref 6–20)
CO2: 26 mmol/L (ref 22–32)
Calcium: 13.8 mg/dL (ref 8.9–10.3)
Chloride: 102 mmol/L (ref 98–111)
Creatinine, Ser: 0.87 mg/dL (ref 0.44–1.00)
GFR, Estimated: >60 mL/min (ref >60)
Glucose, Bld: 167 mg/dL — ABNORMAL HIGH (ref 70–99)
Potassium: 2.8 mmol/L — ABNORMAL LOW (ref 3.5–5.1)
Sodium: 137 mmol/L (ref 135–145)

## 2021-03-27 NOTE — Progress Notes (Signed)
Critical value note: Calcium 13.8. Dr. Kathyrn Sheriff notified.

## 2021-03-27 NOTE — Progress Notes (Signed)
Physical Therapy Treatment Patient Details Name: Amanda Henry MRN: [id number] DOB: [birth year] Today's Date: 03/27/2021   History of Present Illness This 55 y.o. female admitted with sudden onset severe HA.  She was found to have [place].  Pt with increased lethargy and decreased responsiveness 10/30. she was found to have developing hydrocephalus and underwent EVD placement. 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   PMH includes:  anxiety, depression, HTN    PT Comments    Patient demos increased cognitive deficits this date compared to previous week. Patient with expressive difficulties at times. Requires minA for balance for short ambulation and mod-max cues for sequencing and problem solving. Continue to recommend CIR level therapies to assist with maximizing functional mobility and safety.     Recommendations for follow up therapy are one component of a multi-disciplinary discharge planning process, led by the attending physician.  Recommendations may be updated based on patient status, additional functional criteria and insurance authorization.  Follow Up Recommendations  Acute inpatient rehab (3hours/day)     Assistance Recommended at Discharge Frequent or constant Supervision/Assistance  Equipment Recommendations  Rolling Kevontae Burgoon (2 wheels)    Recommendations for Other Services       Precautions / Restrictions Precautions Precautions: Fall Precaution Comments: SBP > 140 Restrictions Weight Bearing Restrictions: No     Mobility  Bed Mobility Overal bed mobility: Needs Assistance Bed Mobility: Supine to Sit     Supine to sit: Min assist     General bed mobility comments: minA for initiation and mod cues to problem solve    Transfers Overall transfer level: Needs assistance Equipment used: Rolling Aviraj Kentner (2 wheels) Transfers: Sit to/from Stand Sit to Stand: Min assist           General transfer comment: minA to rise and steady. Cues for  hand placement but poor follow through. Able to stand from low bed surface and commode x 2    Ambulation/Gait Ambulation/Gait assistance: Min assist Gait Distance (Feet): 20 Feet Assistive device: Rolling Anelisse Jacobson (2 wheels) Gait Pattern/deviations: Step-through pattern;Staggering left;Staggering right Gait velocity: decr     General Gait Details: minA for safety and balance. Mod cues for direction and task. Deferred further distance due to lack of +2 and patient inconsistently following commnads   Stairs             Wheelchair Mobility    Modified Rankin (Stroke Patients Only) Modified Rankin (Stroke Patients Only) Pre-Morbid Rankin Score: No symptoms Modified Rankin: Moderately severe disability     Balance Overall balance assessment: Needs assistance Sitting-balance support: No upper extremity supported;Feet supported Sitting balance-Leahy Scale: Good     Standing balance support: Bilateral upper extremity supported;During functional activity Standing balance-Leahy Scale: Poor Standing balance comment: up to minA for balnace                            Cognition Arousal/Alertness: Awake/alert Behavior During Therapy: Flat affect Overall Cognitive Status: Impaired/Different from baseline Area of Impairment: Attention;Memory;Following commands;Safety/judgement;Awareness;Problem solving                   Current Attention Level: Sustained Memory: Decreased short-term memory Following Commands: Follows one step commands inconsistently;Follows one step commands with increased time Safety/Judgement: Decreased awareness of deficits;Decreased awareness of safety Awareness: Intellectual Problem Solving: Slow processing;Decreased initiation;Difficulty sequencing;Requires verbal cues;Requires tactile cues General Comments: easily distracted requiring mod cues for attending to task. STM deficits noted with provided commands  and patient unsure of next  steps. Slow processing noted. Seems that patient gets distracted prior to being able to process information given        Exercises      General Comments General comments (skin integrity, edema, etc.): patient with expressive difficulties at times. VSS      Pertinent Vitals/Pain Pain Assessment: Faces Faces Pain Scale: Hurts little more Pain Location: headache Pain Descriptors / Indicators: Headache;Aching;Grimacing Pain Intervention(s): Monitored during session;Repositioned    Home Living                          Prior Function            PT Goals (current goals can now be found in the care plan section) Acute Rehab PT Goals Patient Stated Goal: none stated PT Goal Formulation: With patient Time For Goal Achievement: 03/31/21 Potential to Achieve Goals: Good Progress towards PT goals: Progressing toward goals    Frequency    Min 4X/week      PT Plan Current plan remains appropriate    Co-evaluation              AM-PAC PT "6 Clicks" Mobility   Outcome Measure  Help needed turning from your back to your side while in a flat bed without using bedrails?: A Little Help needed moving from lying on your back to sitting on the side of a flat bed without using bedrails?: A Little Help needed moving to and from a bed to a chair (including a wheelchair)?: A Lot Help needed standing up from a chair using your arms (e.g., wheelchair or bedside chair)?: A Lot Help needed to walk in hospital room?: A Lot Help needed climbing 3-5 steps with a railing? : Total 6 Click Score: 13    End of Session Equipment Utilized During Treatment: Gait belt Activity Tolerance: Patient tolerated treatment well Patient left: in chair;with call bell/phone within reach;with chair alarm set Nurse Communication: Mobility status PT Visit Diagnosis: Unsteadiness on feet (R26.81);Muscle weakness (generalized) (M62.81);Other abnormalities of gait and mobility (R26.89);Other symptoms  and signs involving the nervous system (R29.898)     Time: 6295-2841 PT Time Calculation (min) (ACUTE ONLY): 19 min  Charges:  $Gait Training: 8-22 mins                     Xochil Shanker A. Gilford Rile PT, DPT Acute Rehabilitation Services Pager [phone] Office [phone]    [name] 03/27/2021, 5:02 PM

## 2021-03-27 NOTE — Progress Notes (Signed)
Speech Language Pathology Treatment: Cognitive-Linquistic  Patient Details Name: Amanda Henry MRN: [id number] DOB: [birth year] Today's Date: 03/27/2021 Time: 1700-1749 SLP Time Calculation (min) (ACUTE ONLY): 19 min  Assessment / Plan / Recommendation Clinical Impression  Pt was seen for therapy targeting cognitive goals. Pt overall presenting this date with significant impairments in attention and memory resulting in difficulty completing executive functioning task despite multiple verbal and visual cues, models, and reduction of items in visual field. Pt had difficulty locating specific lines of a reading passage and would read lines other than the line requested. Pt recalled vague details re: previous therapy session with PT from this morning with multiple verbal cues and reminders and exhibited further difficulty set switching during conversation, requiring verbal and visual cues to answer questions re: new conversation topic. SLP will continue to follow acutely for therapy targeting cognition and executive functioning.    HPI HPI: 55 y.o. female presenting to the hospital complaining of worst headache found to have [place] with change in mental status 10/29 s/p ventriculostomy drain. Medical hx includes HTN, thyroid disease.      SLP Plan  Continue with current plan of care      Recommendations for follow up therapy are one component of a multi-disciplinary discharge planning process, led by the attending physician.  Recommendations may be updated based on patient status, additional functional criteria and insurance authorization.    Recommendations                   General recommendations: Rehab consult Oral Care Recommendations: Oral care BID Follow Up Recommendations: Acute inpatient rehab (3hours/day) Assistance recommended at discharge: PRN SLP Visit Diagnosis: Cognitive communication deficit (S49.675) Plan: Continue with current plan of care       GO               Dewitt Rota, SLP-Student   Dewitt Rota  03/27/2021, 1:20 PM

## 2021-03-27 NOTE — Progress Notes (Signed)
Occupational Therapy Treatment Patient Details Name: Amanda Henry MRN: [id number] DOB: 10/20/1965 Today's Date: 03/27/2021   History of present illness This 55 y.o. female admitted with sudden onset severe HA.  She was found to have [place].  Pt with increased lethargy and decreased responsiveness 10/30. she was found to have developing hydrocephalus and underwent EVD placement. 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   PMH includes:  anxiety, depression, HTN   OT comments  Pt required increased assistance this date for bed mobility, transfers, and ADLs due to cognitive deficits - she was noted to perseverate, and required significant cueing and ultimately assistance for problem solving, sequencing, and attention during ADLs.  She was noted to frequently self distract during ADLs.  Continue to recommend CIR.    Recommendations for follow up therapy are one component of a multi-disciplinary discharge planning process, led by the attending physician.  Recommendations may be updated based on patient status, additional functional criteria and insurance authorization.    Follow Up Recommendations  Acute inpatient rehab (3hours/day)    Assistance Recommended at Discharge Frequent or constant Supervision/Assistance  Equipment Recommendations  None recommended by OT    Recommendations for Other Services Rehab consult    Precautions / Restrictions Precautions Precautions: Fall Precaution Comments: SBP > 140       Mobility Bed Mobility Overal bed mobility: Needs Assistance Bed Mobility: Supine to Sit     Supine to sit: Min assist     General bed mobility comments: Pt required mod cues to problem solve and sequence moving to EOB - see comments under cognition    Transfers Overall transfer level: Needs assistance Equipment used: Rolling walker (2 wheels) Transfers: Sit to/from Stand Sit to Stand: Min assist Stand pivot transfers: Mod assist         General  transfer comment: see ADL comments     Balance     Sitting balance-Leahy Scale: Good     Standing balance support: During functional activity Standing balance-Leahy Scale: Poor Standing balance comment: reqyuired up to min A due to dizziness and impaired balance                           ADL either performed or assessed with clinical judgement   ADL Overall ADL's : Needs assistance/impaired     Grooming: Wash/dry hands;Wash/dry face;Oral care;Minimal assistance;Standing Grooming Details (indicate cue type and reason): and mod verbal cues to brush teeth - see comments under cognition                 Toilet Transfer: Moderate assistance;Ambulation;Comfort height toilet;Rolling walker (2 wheels) Toilet Transfer Details (indicate cue type and reason): pt required max cues to back up and step to Lt.  She required physical assist to maneuver RW to the Lt and to sit Toileting- Clothing Manipulation and Hygiene: Maximal assistance;Sit to/from stand       Functional mobility during ADLs: Minimal assistance;Rolling walker (2 wheels)      Extremity/Trunk Assessment Upper Extremity Assessment Upper Extremity Assessment: LUE deficits/detail LUE Deficits / Details: Pt demonstrated difficulty keeping Lt hand on RW while backing to the commode, but AROM appeared WFL LUE Coordination: decreased gross motor   Lower Extremity Assessment Lower Extremity Assessment: Defer to PT evaluation        Vision       Perception Perception Perception: Impaired   Praxis Praxis Praxis: Impaired Praxis Impairment Details: Initiation    Cognition Arousal/Alertness:  Awake/alert Behavior During Therapy: Flat affect Overall Cognitive Status: Impaired/Different from baseline Area of Impairment: Attention;Memory;Following commands;Safety/judgement;Awareness;Problem solving                   Current Attention Level: Sustained (with up  to moderate cues) Memory: Decreased  short-term memory Following Commands: Follows one step commands inconsistently;Follows one step commands with increased time Safety/Judgement: Decreased awareness of deficits;Decreased awareness of safety Awareness: Intellectual Problem Solving: Slow processing;Decreased initiation;Difficulty sequencing;Requires verbal cues;Requires tactile cues General Comments: Pt internally distracted easily, and requires up to mod cues to susatain activity to familiar activities. she was noted to perseverate on topics and self distract due to perseveration.  She required mod cues for intitation, and to problem solve and sequence how to move to the EOB.  While brushing her teeth, she required cues to identify which tools to use.  She attempted to apply toothpaste to toothbrush without opening the toothpaste container.  Perseveration noted while brushing her teeth.  She was unable to problem solve how to rinse her mouth, and mis identified her toothpaste as mouthwash, then proceeded to put toothpaste on the toothbrush again, and started to brush teeth again.  Pt required max cues to problem solve how to back up and step to the Lt to use the commode.          Exercises     Shoulder Instructions       General Comments Pt noted with difficulty stepping to Lt, with responses being less robust, and Lt hand falling off of walker for ~1-2 mins while transfering to commode.   Once seated, she was able to follow commands, and did endorse dizziness.  BP 148.  Pt transferred to [place] and RN notified.    Pertinent Vitals/ Pain       Pain Assessment: Faces Faces Pain Scale: Hurts even more Pain Location: headache Pain Descriptors / Indicators: Headache;Aching;Grimacing Pain Intervention(s): Limited activity within patient's tolerance;Monitored during session  Home Living                                          Prior Functioning/Environment              Frequency  Min 3X/week         Progress Toward Goals  OT Goals(current goals can now be found in the care plan section)  Progress towards OT goals: Not progressing toward goals - comment (Pt required increased assistance this date due to impaired cognition)     Plan Discharge plan remains appropriate    Co-evaluation                 AM-PAC OT "6 Clicks" Daily Activity     Outcome Measure   Help from another person eating meals?: None Help from another person taking care of personal grooming?: A Lot Help from another person toileting, which includes using toliet, bedpan, or urinal?: A Lot Help from another person bathing (including washing, rinsing, drying)?: A Lot Help from another person to put on and taking off regular upper body clothing?: A Lot Help from another person to put on and taking off regular lower body clothing?: A Lot 6 Click Score: 14    End of Session Equipment Utilized During Treatment: Rolling walker (2 wheels)  OT Visit Diagnosis: Unsteadiness on feet (R26.81);Cognitive communication deficit (R41.841);Pain Symptoms and signs involving cognitive functions: Nontraumatic SAH Pain -  part of body:  (back and head)   Activity Tolerance Patient tolerated treatment well   Patient Left in chair;with call bell/phone within reach;with chair alarm set   Nurse Communication Mobility status        Time: 5051-8335 OT Time Calculation (min): 47 min  Charges: OT General Charges $OT Visit: 1 Visit OT Treatments $Self Care/Home Management : 38-52 mins  Nilsa Nutting., OTR/L Acute Rehabilitation Services Pager [phone] Office [phone]   Lucille Passy M 03/27/2021, 11:52 AM

## 2021-03-27 NOTE — Progress Notes (Signed)
Transcranial Doppler   Date POD PCO2 HCT BP   MCA ACA PCA OPHT SIPH VERT Basilar  10/29 [facility]         Right  Left   58  35   -17  *   -31  -17   19  12   20  16    -28  -35   -42        10/31 [facility]         Right  Left    64   *    -18   *    18   -29    *   *    20   16    -30   -39    -57        11/2 [facility]         Right  Left    91   56    -30   *    -38   -43    27   22    51   *    -63   -29    -45         11/7 CK          Right  Left    *   *    *   *    *   29    28   *    *   *    24   -54    -55.7        11/9 RH           Right  Left    86   52    -34   *    28   42    30   24    23    32    -51   -24    *                 Right  Left                                                               Right  Left                                                      *unable to insonate MCA = Middle Cerebral Artery      OPHT = Opthalmic Artery     BASILAR = Basilar Artery   ACA = Anterior Cerebral Artery     SIPH = Carotid Siphon PCA = Posterior Cerebral Artery   VERT = Verterbral Artery                    Normal MCA = 62+\-12 ACA = 50+\-12 PCA = 42+\-23    RT [place] =  3.91 LT [place] = 1.86  Darlin Coco, RDMS, RVT           Electronically signed by Iantha Fallen, RVS at 03/25/2021  2:18 PM

## 2021-03-27 NOTE — Progress Notes (Signed)
  NEUROSURGERY PROGRESS NOTE   No issues overnight. Pt relates unchanged HA. Appears mildly confused but answers questions appropriately.  EXAM:  BP (!) 158/65   Pulse 60   Temp 98.6 F (37 C) (Oral)   Resp (!) 0   Ht 5\' 8"  (1.727 m)   Wt 83.9 kg   LMP 08/28/2016 (Exact Date) Comment: come back on 09/15/2016  SpO2 96%   BMI 28.13 kg/m   Awake, alert, oriented x4 Speech fluent, appropriate  CN grossly intact  5/5 BUE/BLE  EVD remains clamped  IMPRESSION:  55 y.o. female [place] d# 12 s/p coiling right AChor aneurysm. Non-focal exam, appears somewhat more delirious today. Communicating hydrocephalus - has tolerated clamped EVD x 48hrs without neurologic change.  PLAN: - Cont to mobilize - d/c EVD today - Pt does have primary hyperparathyroidism likely explaining her hypercalcemia, although U/S did reveal 2cm thyroid/parathyroid nodule which may require FNA on outpatient basis.   Consuella Lose, MD [facility]

## 2021-03-28 MED ORDER — POTASSIUM CHLORIDE 20 MEQ PO PACK
20.0000 meq | PACK | Freq: Two times a day (BID) | ORAL | Status: DC
  Administered 2021-03-28 – 2021-03-29 (×3): 20 meq via ORAL
  Filled 2021-03-28 (×3): qty 1

## 2021-03-28 NOTE — Progress Notes (Signed)
Inpatient Rehab Admissions Coordinator:   Met with patient at bedside.  She is sleeping but immediately alert once I start talking to her.  She speaks in fluent and appropriate sentences and was able to tell me her plan is to stay with her parents after rehab and that she's looking forward to therapy.  I will start insurance authorization tomorrow and call her family to discuss.    Shann Medal, PT, DPT Admissions Coordinator [phone] 03/28/21  4:39 PM

## 2021-03-28 NOTE — Progress Notes (Signed)
Speech/Language/Cognitive Treatment  Pt was seen for therapy targeting cognitive goals. SLP administered money counting task targeting thought organization, set switching, memory, and awareness. Pt began sorting money independently however required 2 verbal and visual cues to correct one sorting inaccuracy. Once bills were sorted, pt required one to one verbal and visual cues to add bills together, often requiring reduction of items in visual field and reduction of task complexity to complete calculations. Pt benefited from seeing the calculation written down, however unable to accurately transcribe calculation independently, requiring transcription from SLP. Pt and mother educated re: rehabilitation process and the importance of regular practice. SLP recommended writing important events and information in journal to refer to as needed until memory improves. Pt verbalized understanding and notebook and pen left open near pt for facilitation of use. SLP will continue to follow for therapy targeting cognitive goals.    03/28/21 1007  SLP Visit Information  SLP Received On 03/28/21  General Information  Behavior/Cognition Alert;Cooperative;Pleasant mood  Patient Positioning Upright in chair/Tumbleform  Oral care provided N/A  HPI 55 y.o. female presenting to the hospital complaining of worst headache found to have [place] with change in mental status 10/29 s/p ventriculostomy drain. Medical hx includes HTN, thyroid disease.  Treatment Provided  Treatment provided Cognitive-Linquistic  Pain Assessment  Pain Assessment 0-10  Pain Score 5  Pain Location headache  Pain Descriptors / Indicators Headache;Aching;Grimacing  Pain Intervention(s) Limited activity within patient's tolerance;Monitored during session  Cognitive-Linquistic Treatment  Treatment focused on Cognition  Skilled Treatment see impressions  SLP - End of Session  Patient left in chair;with call bell/phone within reach;with family/visitor  present  Nurse Communication Treatment plan  Assessment / Recommendations / Plan  Plan Continue with current plan of care  General Recommendations  General recommendations Rehab consult  Oral Care Recommendations Oral care BID  Follow Up Recommendations Acute inpatient rehab (3hours/day)  Assistance recommended at discharge PRN  SLP Visit Diagnosis Cognitive communication deficit (R41.841)  Progression Toward Goals  Progression toward goals Progressing toward goals  Patient/Family Stated Goal none stated  Potential to Achieve Goals (ACUTE ONLY) Good  Potential Considerations (ACUTE ONLY) Previous level of function;Family/community support;Ability to learn/carryover information  SLP Time Calculation  SLP Start Time (ACUTE ONLY) [id number]  SLP Stop Time (ACUTE ONLY) 1003  SLP Time Calculation (min) (ACUTE ONLY) 24 min  SLP Evaluations  $ SLP Speech Visit 1 Visit  SLP Evaluations  $Speech Treatment for Individual 1 Procedure   Dewitt Rota, SLP-Student

## 2021-03-28 NOTE — Progress Notes (Signed)
Physical Therapy Treatment Patient Details Name: Amanda Henry MRN: [id number] DOB: March 21, 1966 Today's Date: 03/28/2021   History of Present Illness This 55 y.o. female admitted with sudden onset severe HA.  She was found to have [place].  Pt with increased lethargy and decreased responsiveness 10/30. she was found to have developing hydrocephalus and underwent EVD placement. 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   PMH includes:  anxiety, depression, HTN    PT Comments    Initially, patient conversive and improved processing from previous session. With mobility, patient with slower processing and less conversive. Patient orthostatic initially (see below) with ambulation but BP increased after prolonged standing. With increasing distance, patient became slow to respond and difficulty maneuvering RW in straight path requiring assist and following directions back to room. BP sitting in recliner at end of session 144/87.  Continue to recommend CIR level therapies to assist with maximizing functional mobility and safety.   Orthostatic BPs  Sitting 133/97  Standing 112/94  Standing after 2 min 127/79  Sitting after 10' ambulation 128/76  Standing at sink  114/79     Recommendations for follow up therapy are one component of a multi-disciplinary discharge planning process, led by the attending physician.  Recommendations may be updated based on patient status, additional functional criteria and insurance authorization.  Follow Up Recommendations  Acute inpatient rehab (3hours/day)     Assistance Recommended at Discharge Frequent or constant Supervision/Assistance  Equipment Recommendations  Rolling Clete Kuch (2 wheels)    Recommendations for Other Services       Precautions / Restrictions Precautions Precautions: Fall Precaution Comments: watch BP, orthostatic? Restrictions Weight Bearing Restrictions: No     Mobility  Bed Mobility Overal bed mobility: Needs  Assistance Bed Mobility: Supine to Sit   Sidelying to sit: Min assist       General bed mobility comments: minA to initiate movement and mod cues to problem solve intiation of task    Transfers Overall transfer level: Needs assistance Equipment used: Rolling Erubiel [place] (2 wheels) Transfers: Sit to/from Stand Sit to Stand: Min guard           General transfer comment: min guard for safety, cues for hand placement but patient continues to pull on RW to stand    Ambulation/Gait Ambulation/Gait assistance: Min assist;Mod assist Gait Distance (Feet): 10 Feet (x25') Assistive device: Rolling Deamber [place]halter (2 wheels) Gait Pattern/deviations: Step-through pattern;Staggering left;Staggering right Gait velocity: decreased     General Gait Details: minA initially to ambulate to bathroom with good navigation of RW. With increased distance, patient with difficulty maneuvering RW and patient became less conversive and slow to process. BP checked at end of ambulation and sitting in chair -144/87 (obtained orthostatics prior to initial mobility - see above). Difficulty following directions back to room   Stairs             Wheelchair Mobility    Modified Rankin (Stroke Patients Only) Modified Rankin (Stroke Patients Only) Pre-Morbid Rankin Score: No symptoms Modified Rankin: Moderately severe disability     Balance Overall balance assessment: Needs assistance Sitting-balance support: No upper extremity supported;Feet supported Sitting balance-Leahy Scale: Good     Standing balance support: Bilateral upper extremity supported;During functional activity Standing balance-Leahy Scale: Poor Standing balance comment: up to modA for balnace with increasing distance                            Cognition Arousal/Alertness: Awake/alert Behavior  During Therapy: Flat affect Overall Cognitive Status: Impaired/Different from baseline Area of Impairment: Attention;Memory;Following  commands;Safety/judgement;Awareness;Problem solving                   Current Attention Level: Sustained Memory: Decreased short-term memory Following Commands: Follows one step commands inconsistently;Follows one step commands with increased time Safety/Judgement: Decreased awareness of deficits;Decreased awareness of safety Awareness: Intellectual Problem Solving: Slow processing;Decreased initiation;Difficulty sequencing;Requires verbal cues;Requires tactile cues General Comments: while lying in bed, patient able to carry on conversation but with mobility, patient less conversive and slow to process questions/commands        Exercises      General Comments        Pertinent Vitals/Pain Pain Assessment: Faces Faces Pain Scale: Hurts a little bit Pain Location: headache Pain Descriptors / Indicators: Headache;Aching;Grimacing Pain Intervention(s): Monitored during session;Repositioned    Home Living                          Prior Function            PT Goals (current goals can now be found in the care plan section) Acute Rehab PT Goals Patient Stated Goal: to move better PT Goal Formulation: With patient Time For Goal Achievement: 03/31/21 Potential to Achieve Goals: Good Progress towards PT goals: Progressing toward goals    Frequency    Min 4X/week      PT Plan Current plan remains appropriate    Co-evaluation              AM-PAC PT "6 Clicks" Mobility   Outcome Measure  Help needed turning from your back to your side while in a flat bed without using bedrails?: A Little Help needed moving from lying on your back to sitting on the side of a flat bed without using bedrails?: A Little Help needed moving to and from a bed to a chair (including a wheelchair)?: A Lot Help needed standing up from a chair using your arms (e.g., wheelchair or bedside chair)?: A Lot Help needed to walk in hospital room?: A Lot Help needed climbing 3-5  steps with a railing? : Total 6 Click Score: 13    End of Session Equipment Utilized During Treatment: Gait belt Activity Tolerance: Patient tolerated treatment well Patient left: in chair;with call bell/phone within reach;with chair alarm set Nurse Communication: Mobility status PT Visit Diagnosis: Unsteadiness on feet (R26.81);Muscle weakness (generalized) (M62.81);Other abnormalities of gait and mobility (R26.89);Other symptoms and signs involving the nervous system (C37.628)     Time: 3151-7616 PT Time Calculation (min) (ACUTE ONLY): 25 min  Charges:  $Gait Training: 23-37 mins                     Cherith Tewell A. Gilford Rile PT, DPT Acute Rehabilitation Services Pager [phone] Office [phone]    [name] 03/28/2021, 5:19 PM

## 2021-03-28 NOTE — Progress Notes (Signed)
  NEUROSURGERY PROGRESS NOTE   No issues overnight. Pt relates unchanged HA and remains mildly confused but answers questions appropriately.  EXAM:  BP (!) 162/91 Comment: using bathroom  Pulse 83   Temp 99.2 F (37.3 C) (Oral)   Resp 12   Ht 5\' 8"  (1.727 m)   Wt 83.9 kg   LMP 08/28/2016 (Exact Date) Comment: come back on 09/15/2016  SpO2 97%   BMI 28.13 kg/m   Awake, alert, oriented x4 Speech fluent, appropriate  CN grossly intact  5/5 BUE/BLE  EVD remains clamped  IMPRESSION:  55 y.o. female [place] d# 13 s/p coiling right AChor aneurysm. Non-focal exam, stable confusion. Suspect ICU delirium. She has remained afebrile, largely normal angiogram. Communicating hydrocephalus - resolved.  PLAN: - Cont to mobilize -  transfer to floor - Plan on CIR when bed available   Consuella Lose, MD [facility]

## 2021-03-29 ENCOUNTER — Inpatient Hospital Stay (HOSPITAL_COMMUNITY): Payer: BC Managed Care – PPO

## 2021-03-29 DIAGNOSIS — I609 Nontraumatic subarachnoid hemorrhage, unspecified: Secondary | ICD-10-CM

## 2021-03-29 MED ORDER — POTASSIUM CHLORIDE CRYS ER 20 MEQ PO TBCR
20.0000 meq | EXTENDED_RELEASE_TABLET | Freq: Two times a day (BID) | ORAL | Status: DC
  Administered 2021-03-30 – 2021-04-02 (×9): 20 meq via ORAL
  Filled 2021-03-29 (×9): qty 1

## 2021-03-29 NOTE — Progress Notes (Signed)
Inpatient Rehab Admissions Coordinator:   Spoke to pt's mother on the phone to discuss rehab goals/expectations.  She verbalizes understanding of estimated 2 week length of stay with goals of supervision.  Confirmed pt's plan to discharge to parents home after CIR.  Insurance auth pending and I will continue to follow for potential admit pending approval and bed availability.    Shann Medal, PT, DPT Admissions Coordinator [phone] 03/29/21  12:24 PM

## 2021-03-29 NOTE — Progress Notes (Signed)
Physical Therapy Treatment Patient Details Name: Amanda Henry MRN: [id number] DOB: [birth year] Today's Date: 03/29/2021   History of Present Illness This 55 y.o. female admitted with sudden onset severe HA.  She was found to have [place].  Pt with increased lethargy and decreased responsiveness 10/30. she was found to have developing hydrocephalus and underwent EVD placement. 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   PMH includes:  anxiety, depression, HTN    PT Comments    Patient seen in conjunction with OT due to orthostatic issues with RN earlier this AM. BP stable during session. Patient requiring increased assist for ambulation with addition of cognitive dual task and required standing rest breaks at times. Continue to recommend CIR level therapies to assist with maximizing functional mobility and safety.     Recommendations for follow up therapy are one component of a multi-disciplinary discharge planning process, led by the attending physician.  Recommendations may be updated based on patient status, additional functional criteria and insurance authorization.  Follow Up Recommendations  Acute inpatient rehab (3hours/day)     Assistance Recommended at Discharge Frequent or constant Supervision/Assistance  Equipment Recommendations  Rolling Margie Urbanowicz (2 wheels)    Recommendations for Other Services       Precautions / Restrictions Precautions Precautions: Fall Precaution Comments: watch BP--was orthostatic with RN this AM, but did not display with this during therapy session this PM Restrictions Weight Bearing Restrictions: No     Mobility  Bed Mobility               General bed mobility comments: Pt up in recliner upon arrival    Transfers Overall transfer level: Needs assistance Equipment used: Rolling Geraldyne Barraclough (2 wheels) Transfers: Sit to/from Stand Sit to Stand: Min assist Stand pivot transfers: Min assist         General transfer  comment: VCs for safe hand placement, had to repeat them since she said okay but then started to do what we had told her not to    Ambulation/Gait Ambulation/Gait assistance: Min assist Gait Distance (Feet): 50 Feet (x50, x100') Assistive device: Rolling Demya Scruggs (2 wheels) Gait Pattern/deviations: Step-through pattern;Staggering left;Staggering right Gait velocity: decreased     General Gait Details: minA throughout ambulation. Required increased assist for Jhamal Plucinski with cognitive dual task. Seated rest break x 1 and standing rest break x 2 during ambulation. Cues required for maintaining grip on RW and safe maneuvering of RW in hallway   Stairs             Wheelchair Mobility    Modified Rankin (Stroke Patients Only) Modified Rankin (Stroke Patients Only) Pre-Morbid Rankin Score: No symptoms Modified Rankin: Moderately severe disability     Balance Overall balance assessment: Needs assistance Sitting-balance support: No upper extremity supported;Feet supported Sitting balance-Leahy Scale: Good     Standing balance support: No upper extremity supported;During functional activity Standing balance-Leahy Scale: Fair Standing balance comment: standing at sink to wash hands                            Cognition Arousal/Alertness: Awake/alert Behavior During Therapy: Flat affect Overall Cognitive Status: Impaired/Different from baseline Area of Impairment: Attention;Safety/judgement;Awareness;Problem solving                   Current Attention Level: Sustained     Safety/Judgement: Decreased awareness of safety;Decreased awareness of deficits Awareness: Emergent Problem Solving: Requires verbal cues;Requires tactile cues General Comments: Pt  seated in recliner, before standing we asked her to push up from her chair not grab the Solei Wubben. She said okay, but then immediately grabbed the Maydelin Deming and needed verbal and gestural cues to use arms of chair to push  up. While ambulating she was asked some multiplication problems (9x9, 9x6)--could not get these on her own, (9x5)-increased time but got it, (9x10)-got it right away. 2nd time I asked her 9x9 took increased time but got it. 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"        Exercises      General Comments        Pertinent Vitals/Pain Pain Assessment: Faces Faces Pain Scale: Hurts a little bit Pain Location: headache Pain Descriptors / Indicators: Headache;Aching Pain Intervention(s): Limited activity within patient's tolerance;Monitored during session    Home Living                          Prior Function            PT Goals (current goals can now be found in the care plan section) Acute Rehab PT Goals Patient Stated Goal: to move better PT Goal Formulation: With patient Time For Goal Achievement: 03/31/21 Potential to Achieve Goals: Good Progress towards PT goals: Progressing toward goals    Frequency    Min 4X/week      PT Plan Current plan remains appropriate    Co-evaluation   Reason for Co-Treatment: For patient/therapist safety;To address functional/ADL transfers;Necessary to address cognition/behavior during functional activity PT goals addressed during session: Mobility/safety with mobility;Balance OT goals addressed during session: ADL's and self-care;Strengthening/ROM (cognition)      AM-PAC PT "6 Clicks" Mobility   Outcome Measure  Help needed turning from your back to your side while in a flat bed without using bedrails?: A Little Help needed moving from lying on your back to sitting on the side of a flat bed without using bedrails?: A Little Help needed moving to and from a bed to a chair (including a wheelchair)?: A Lot Help needed standing up from a chair using your arms (e.g., wheelchair or bedside chair)?: A Lot Help needed to walk in hospital room?: A  Lot Help needed climbing 3-5 steps with a railing? : Total 6 Click Score: 13    End of Session Equipment Utilized During Treatment: Gait belt Activity Tolerance: Patient tolerated treatment well Patient left: in bed;with call bell/phone within reach Nurse Communication: Mobility status PT Visit Diagnosis: Unsteadiness on feet (R26.81);Muscle weakness (generalized) (M62.81);Other abnormalities of gait and mobility (R26.89);Other symptoms and signs involving the nervous system (R29.898)     Time: 1403-1430 PT Time Calculation (min) (ACUTE ONLY): 27 min  Charges:  $Gait Training: 8-22 mins                     Catcher Dehoyos A. Gilford Rile PT, DPT Acute Rehabilitation Services Pager [phone] Office [phone]    [name] 03/29/2021, 4:52 PM

## 2021-03-29 NOTE — Progress Notes (Signed)
Occupational Therapy Treatment Patient Details Name: Amanda Henry MRN: [id number] DOB: December 05, 1965 Today's Date: 03/29/2021   History of present illness This 55 y.o. female admitted with sudden onset severe HA.  She was found to have [place].  Pt with increased lethargy and decreased responsiveness 10/30. she was found to have developing hydrocephalus and underwent EVD placement. 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   PMH includes:  anxiety, depression, HTN   OT comments  This 55 yo female seen today with PT due to had orthostatic issues with RN earlier this AM. Pt actually did not have any issues with [place] with orthostatics, but she did have trouble multi-tasking with ambulation and doing simple multiplication problems (pt is a 2nd grade teacher). She will continue to benefit from acute OT with follow up on CIR to give her the best intensive treatment for her physical and cognitive deficits to get back to an independent life.   Recommendations for follow up therapy are one component of a multi-disciplinary discharge planning process, led by the attending physician.  Recommendations may be updated based on patient status, additional functional criteria and insurance authorization.    Follow Up Recommendations  Acute inpatient rehab (3hours/day)    Assistance Recommended at Discharge Frequent or constant Supervision/Assistance  Equipment Recommendations  Other (comment) (TBD next vneu)    Recommendations for Other Services Rehab consult    Precautions / Restrictions Precautions Precautions: Fall Precaution Comments: watch BP--was orthostatic with RN this AM, but did not display with this during therapy session this PM Restrictions Weight Bearing Restrictions: No       Mobility Bed Mobility               General bed mobility comments: Pt up in recliner upon arrival    Transfers Overall transfer level: Needs assistance Equipment used: Rolling walker (2  wheels) Transfers: Sit to/from Stand   Stand pivot transfers: Min assist         General transfer comment: VCs for safe hand placement, had to repeat them since she said okay but then started to do what we had told her not to     Balance Overall balance assessment: Needs assistance Sitting-balance support: No upper extremity supported;Feet supported Sitting balance-Leahy Scale: Good     Standing balance support: No upper extremity supported;During functional activity Standing balance-Leahy Scale: Fair Standing balance comment: standing at sink to wash hands                           ADL either performed or assessed with clinical judgement   ADL Overall ADL's : Needs assistance/impaired     Grooming: Wash/dry hands;Standing Grooming Details (indicate cue type and reason): soap dispenser at sink not working, pt looked at bottles on her sink counter and decided to go with no rinse cleanser to wash her hands.                 Toilet Transfer: Minimal assistance;Ambulation;Rolling walker (2 wheels);Grab bars;Comfort height toilet   Toileting- Clothing Manipulation and Hygiene: Minimal assistance              Extremity/Trunk Assessment Upper Extremity Assessment LUE Deficits / Details: Moving arm well, but did have to look to get left hand back on walker after she took it off to scratch her nose.             Cognition Arousal/Alertness: Awake/alert Behavior During Therapy:  (more expressions today)  Overall Cognitive Status: Impaired/Different from baseline Area of Impairment: Attention;Safety/judgement;Awareness;Problem solving                   Current Attention Level: Sustained     Safety/Judgement: Decreased awareness of safety;Decreased awareness of deficits Awareness: Emergent Problem Solving: Requires verbal cues;Requires tactile cues General Comments: Pt seated in recliner, before standing we asked her to push up from her chair not  grab the walker. She said okay, but then immediately grabbed the walker and needed verbal and gestural cues to use arms of chair to push up. While ambulating she was asked some multiplication problems (9x9, 9x6)--could not get these on her own, (9x5)-increased time but got it, (9x10)-got it right away. 2nd time I asked her 9x9 took increased time but got it. 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"                     Pertinent Vitals/ Pain       Pain Assessment: Faces Faces Pain Scale: Hurts a little bit Pain Location: headache Pain Descriptors / Indicators: Headache;Aching Pain Intervention(s): Monitored during session         Frequency  Min 3X/week        Progress Toward Goals  OT Goals(current goals can now be found in the care plan section)  Progress towards OT goals: Progressing toward goals  Acute Rehab OT Goals OT Goal Formulation: With patient/family Time For Goal Achievement: 04/01/21 Potential to Achieve Goals: Good  Plan Discharge plan remains appropriate    Co-evaluation    PT/OT/SLP Co-Evaluation/Treatment: Yes Reason for Co-Treatment: For patient/therapist safety;To address functional/ADL transfers;Necessary to address cognition/behavior during functional activity PT goals addressed during session: Mobility/safety with mobility;Balance OT goals addressed during session: ADL's and self-care;Strengthening/ROM (cognition)      AM-PAC OT "6 Clicks" Daily Activity     Outcome Measure   Help from another person eating meals?: None Help from another person taking care of personal grooming?: A Little Help from another person toileting, which includes using toliet, bedpan, or urinal?: A Little Help from another person bathing (including washing, rinsing, drying)?: A Little Help from another person to put on and taking off regular upper body clothing?: A Little Help from another  person to put on and taking off regular lower body clothing?: A Little 6 Click Score: 19    End of Session Equipment Utilized During Treatment: Rolling walker (2 wheels)  OT Visit Diagnosis: Unsteadiness on feet (R26.81);Other symptoms and signs involving cognitive function Symptoms and signs involving cognitive functions: Nontraumatic SAH   Activity Tolerance Patient tolerated treatment well   Patient Left in bed;with call bell/phone within reach;with bed alarm set   Nurse Communication Mobility status        Time: 9163-8466 OT Time Calculation (min): 28 min  Charges: OT General Charges $OT Visit: 1 Visit OT Treatments $Self Care/Home Management : 8-22 mins  Golden Circle, OTR/L Acute [employer] Pager [phone] Office [phone]    Almon Register 03/29/2021, 4:15 PM

## 2021-03-29 NOTE — Progress Notes (Signed)
  NEUROSURGERY PROGRESS NOTE   No issues overnight. Doing well this am.  EXAM:  BP (!) 142/80   Pulse 68   Temp 98.4 F (36.9 C) (Oral)   Resp (!) 22   Ht 5\' 8"  (1.727 m)   Wt 83.9 kg   LMP 08/28/2016 (Exact Date) Comment: come back on 09/15/2016  SpO2 98%   BMI 28.13 kg/m   Awake, alert, oriented x4 Speech fluent, appropriate  CN grossly intact  5/5 BUE/BLE  EVD remains clamped  IMPRESSION:  55 y.o. female [place] d# 14 s/p coiling right AChor aneurysm. Non-focal exam, stable confusion likely ICU delirium. Communicating hydrocephalus - resolved.  PLAN: - Cont to mobilize - transfer to stepdown - Cont [place] to finish 21d - Stable for d/c to CIR when bed available   Consuella Lose, MD [facility]

## 2021-03-29 NOTE — Progress Notes (Signed)
Transcranial Doppler   Date POD PCO2 HCT BP   MCA ACA PCA OPHT SIPH VERT Basilar  10/29 RH         Right  Left   58  35   -17  *   -31  -17   19  12   20  16    -28  -35   -42        10/31 [facility]         Right  Left    64   *    -18   *    18   -29    *   *    20   16    -30   -39    -36        11/2 RH         Right  Left    91   56    -30   *    -38   -43    27   22    51   *    -63   -29    -45         11/7 CK          Right  Left    *   *    *   *    *   29    28   *    *   *    24   -54    -55.7        11/9 RH           Right  Left    86   52    -34   *    28   42    30   24    23    32    -51   -24    *        11/11 JH         Right  Left    65   61    -14   *    24   30    23    *    21   18    -37   -24    *   *              Right  Left                                                      *unable to insonate MCA = Middle Cerebral Artery      OPHT = Opthalmic Artery     BASILAR = Basilar Artery   ACA = Anterior Cerebral Artery     SIPH = Carotid Siphon PCA = Posterior Cerebral Artery   VERT = Verterbral Artery                    Normal MCA = 62+\-12 ACA = 50+\-12 PCA = 42+\-23    RT [place] =  2.17 LT [place] = 1.97

## 2021-03-29 NOTE — PMR Pre-admission (Signed)
PMR Admission Coordinator Pre-Admission Assessment  Patient: Amanda Henry is an 55 y.o., female MRN: [id number] DOB: 20-Jul-1965 Height: _0  (172.7 cm) Weight: 83.9 kg  Insurance Information HMO: no    PPO: yes     PCP:      IPA:      80/20:      OTHER:  PRIMARY: BCBS State      Policy#: [id number]      Subscriber:  CM Name: Marcie Bal       Phone#: [phone]     Fax#: [phone] Pre-Cert#: [id number] auth for CIR with updates due to fax listed above (date determined after admit)      Employer:  Benefits:  Phone #: [phone]     Name:  Eff. Date: 05/19/20     Deduct: $1500 ($0 met)      Out of Pocket Max: $5900 (met $2091.26)      Life Max: n/a CIR: 70%      SNF: 70% Outpatient:      Co-Pay: $72/visit Home Health: 70%      Co-Ins: 30% DME: 70%     Co-Ins: 30% Providers:  SECONDARY:       Policy#:      Phone#:   Development worker, community:       Phone#:   The Therapist, art Information Summary" for patients in Inpatient Rehabilitation Facilities with attached "Privacy Act [place] Records" was provided and verbally reviewed with: N/A  Emergency Contact Information Contact Information     Name Relation Home Work Mobile   [place] Mother [phone]  [phone]   [place]. [place] Father [phone]  [phone]   S[place]ne, Manwarren   [phone]       Current Medical History  Patient Admitting Diagnosis: SAH with hydrocephalus   History of Present Illness: Pt is a 55 y/o female who presents to [facility] on 10/28 with the worst headache of her life upon waking.  HA is throbbing, bifrontal and retro-orbital.  Complains of double/blurry vision.  CT head showed diffuse basal subarachnoid hemorrhage including blood in the perimesencephalic region and mild prominence of temporal horns without significant ventriculomegaly.  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  Neurosurgery consulted and  recommended diagnostic cerebral angiogram which was negative.  Abrupt decline in status on 10/29 CT showed hydrocephalus so EVD placed at bedside.  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  Post op had some increased confusion, which has resolved.  EVD out on 11/9.  Therapy evaluations were completed and pt was recommended for CIR.   Complete NIHSS TOTAL: 0  Patient's medical record from [facility] has been reviewed by the rehabilitation admission coordinator and physician.  Past Medical History  Past Medical History:  Diagnosis Date   Anxiety    Blood transfusion without reported diagnosis    Depression    Hypertension    no medications at this time   Hypothyroidism    Iron deficiency anemia    Thyroid disease     Has the patient had major surgery during 100 days prior to admission? Yes  Family History   family history includes Diabetes in her father and another family member; Heart disease in an other family member; Hypertension in her mother and another family member; Thyroid disease in her mother and another family member.  Current Medications  Current Facility-Administered Medications:    0.9 %  sodium chloride infusion, ,  Intravenous, Continuous, Consuella Lose, MD, Stopped at 03/26/21 1500   acetaminophen (TYLENOL) tablet 650 mg, 650 mg, Oral, Q4H PRN, 650 mg at 03/31/21 1940 **OR** acetaminophen (TYLENOL) 160 MG/5ML solution 650 mg, 650 mg, Per Tube, Q4H PRN **OR** acetaminophen (TYLENOL) suppository 650 mg, 650 mg, Rectal, Q4H PRN, Consuella Lose, MD   ALPRAZolam Duanne Moron) tablet 1 mg, 1 mg, Oral, QHS PRN, Consuella Lose, MD, 1 mg at 03/29/21 2215   docusate sodium (COLACE) capsule 100 mg, 100 mg, Oral, BID, Consuella Lose, MD, 100 mg at 04/01/21 0807   escitalopram (LEXAPRO) tablet 10 mg, 10 mg, Oral, QHS, Consuella Lose, MD, 10 mg at 03/31/21 1941   hydrALAZINE (APRESOLINE) injection 5  mg, 5 mg, Intravenous, Q6H PRN, Consuella Lose, MD, 5 mg at 03/27/21 0047   HYDROcodone-acetaminophen (NORCO/VICODIN) 5-325 MG per tablet 1-2 tablet, 1-2 tablet, Oral, Q4H PRN, Consuella Lose, MD, 2 tablet at 03/31/21 1638   influenza vac split quadrivalent PF (FLUARIX) injection 0.5 mL, 0.5 mL, Intramuscular, Prior to discharge, Meyran, Ocie Cornfield, NP   ipratropium (ATROVENT) 0.06 % nasal spray 2 spray, 2 spray, Each Nare, BID PRN, Consuella Lose, MD   labetalol (NORMODYNE) injection 10 mg, 10 mg, Intravenous, Q2H PRN, Consuella Lose, MD, 10 mg at 03/27/21 2331   levothyroxine (SYNTHROID) tablet 150 mcg, 150 mcg, Oral, QAC breakfast, Consuella Lose, MD, 150 mcg at 04/01/21 0645   metoprolol tartrate (LOPRESSOR) tablet 25 mg, 25 mg, Oral, Daily, Consuella Lose, MD, 25 mg at 04/01/21 0807   morphine 2 MG/ML injection 2 mg, 2 mg, Intravenous, Q2H PRN, Consuella Lose, MD, 2 mg at 03/26/21 1433   niMODipine (NIMOTOP) capsule 60 mg, 60 mg, Oral, Q4H, 60 mg at 04/01/21 0806 **OR** niMODipine (NYMALIZE) 6 MG/ML oral solution 60 mg, 60 mg, Per Tube, Q4H, Consuella Lose, MD   ondansetron (ZOFRAN-ODT) disintegrating tablet 4 mg, 4 mg, Oral, Q6H PRN, 4 mg at 03/28/21 1415 **OR** ondansetron (ZOFRAN) injection 4 mg, 4 mg, Intravenous, Q6H PRN, Consuella Lose, MD, 4 mg at 03/26/21 1405   pantoprazole (PROTONIX) EC tablet 40 mg, 40 mg, Oral, Daily, 40 mg at 04/01/21 0807 **OR** pantoprazole sodium (PROTONIX) 40 mg/20 mL oral suspension 40 mg, 40 mg, Per Tube, Daily, Nundkumar, Nena Polio, MD   potassium chloride SA (KLOR-CON) CR tablet 20 mEq, 20 mEq, Oral, BID, Consuella Lose, MD, 20 mEq at 04/01/21 0807   Vitamin D (Ergocalciferol) (DRISDOL) capsule 50,000 Units, 50,000 Units, Oral, Once per day on Mon Thu, Nundkumar, Nena Polio, MD, 50,000 Units at 03/28/21 0906  Patients Current Diet:  Diet Order             Diet regular Room service appropriate? Yes with Assist; Fluid  consistency: Thin  Diet effective now                   Precautions / Restrictions Precautions Precautions: Fall Precaution Comments: watch BP--was orthostatic with RN this AM, but did not display with this during therapy session this PM Restrictions Weight Bearing Restrictions: No   Has the patient had 2 or more falls or a fall with injury in the past year? No  Prior Activity Level Community (5-7x/wk): works full time at baseline, still driving  Prior Functional Level Self Care: Did the patient need help bathing, dressing, using the toilet or eating? Independent  Indoor Mobility: Did the patient need assistance with walking from room to room (with or without device)? Independent  Stairs: Did the patient need assistance with internal or external stairs (with  or without device)? Independent  Functional Cognition: Did the patient need help planning regular tasks such as shopping or remembering to take medications? Independent  Patient Information Are you of Hispanic, Latino/a,or Spanish origin?: A. No, not of Hispanic, Latino/a, or Spanish origin What is your race?: B. Black or African American Do you need or want an interpreter to communicate with a doctor or health care staff?: 0. No  Patient's Response To:  Health Literacy and Transportation Is the patient able to respond to health literacy and transportation needs?: Yes Health Literacy - How often do you need to have someone help you when you read instructions, pamphlets, or other written material from your doctor or pharmacy?: Never In the past 12 months, has lack of transportation kept you from medical appointments or from getting medications?: No In the past 12 months, has lack of transportation kept you from meetings, work, or from getting things needed for daily living?: No  Home Assistive Devices / Equipment Home Equipment: None  Prior Device Use: Indicate devices/aids used by the patient prior to current illness,  exacerbation or injury? None of the above  Current Functional Level Cognition  Arousal/Alertness: Awake/alert Overall Cognitive Status: Impaired/Different from baseline Current Attention Level: Sustained Orientation Level: Oriented X4 Following Commands: Follows one step commands inconsistently, Follows one step commands with increased time Safety/Judgement: Decreased awareness of safety, Decreased awareness of deficits General Comments: Pt seated in recliner, before standing we asked her to push up from her chair not grab the walker. She said okay, but then immediately grabbed the walker and needed verbal and gestural cues to use arms of chair to push up. While ambulating she was asked some multiplication problems (9x9, 9x6)--could not get these on her own, (9x5)-increased time but got it, (9x10)-got it right away. 2nd time I asked her 9x9 took increased time but got it. 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" Attention: Sustained, Alternating Sustained Attention: Impaired Sustained Attention Impairment: Verbal complex, Functional complex Alternating Attention: Impaired Alternating Attention Impairment: Verbal complex, Functional complex Memory: Impaired Memory Impairment: Decreased short term memory, Decreased recall of new information Awareness: Appears intact Problem Solving: Impaired Problem Solving Impairment: Verbal complex, Functional complex Executive Function: Organizing, Self Monitoring Organizing: Impaired Self Monitoring: Impaired    Extremity Assessment (includes Sensation/Coordination)  Upper Extremity Assessment: LUE deficits/detail RUE Deficits / Details: grossly 4/5 LUE Deficits / Details: Moving arm well, but did have to look to get left hand back on walker after she took it off to scratch her nose. LUE Coordination: decreased gross motor  Lower Extremity Assessment: Defer to PT  evaluation RLE Deficits / Details: grossly 4+/5 LLE Deficits / Details: grossly 4/5    ADLs  Overall ADL's : Needs assistance/impaired Eating/Feeding: Independent Grooming: Wash/dry hands, Standing Grooming Details (indicate cue type and reason): soap dispenser at sink not working, pt looked at bottles on her sink counter and decided to go with no rinse cleanser to wash her hands. Upper Body Bathing: Minimal assistance, Sitting Lower Body Bathing: Moderate assistance, Sit to/from stand Upper Body Dressing : Moderate assistance, Sitting Lower Body Dressing: Moderate assistance, Sit to/from stand Lower Body Dressing Details (indicate cue type and reason): Can get to feet by crossing legs over one another aleternately at knee Toilet Transfer: Minimal assistance, Ambulation, Rolling walker (2 wheels), Grab bars, Comfort height toilet Toilet Transfer Details (indicate cue type and reason): pt required  max cues to back up and step to Lt.  She required physical assist to maneuver RW to the Lt and to sit Toileting- Clothing Manipulation and Hygiene: Minimal assistance Functional mobility during ADLs: Minimal assistance, Rolling walker (2 wheels) General ADL Comments: drifted to R with ambulation, decreased expressive language abilities in session    Mobility  Overal bed mobility: Needs Assistance Bed Mobility: Supine to Sit Sidelying to sit: Min assist Supine to sit: Min assist Sit to supine: Supervision Sit to sidelying: Supervision General bed mobility comments: Pt up in recliner upon arrival    Transfers  Overall transfer level: Needs assistance Equipment used: Rolling walker (2 wheels) Transfers: Sit to/from Stand Sit to Stand: Min assist Stand pivot transfers: Min assist General transfer comment: VCs for safe hand placement, had to repeat them since she said okay but then started to do what we had told her not to    Ambulation / Gait / Stairs / Wheelchair Mobility   Ambulation/Gait Ambulation/Gait assistance: Herbalist (Feet): 50 Feet (x50, x100') Assistive device: Rolling walker (2 wheels) Gait Pattern/deviations: Step-through pattern, Staggering left, Staggering right General Gait Details: minA throughout ambulation. Required increased assist for walker with cognitive dual task. Seated rest break x 1 and standing rest break x 2 during ambulation. Cues required for maintaining grip on RW and safe maneuvering of RW in hallway Gait velocity: decreased Gait velocity interpretation: <1.31 ft/sec, indicative of household ambulator    Posture / Balance Dynamic Sitting Balance Sitting balance - Comments: can sit EOB and cross legs to get to socks without LOB Balance Overall balance assessment: Needs assistance Sitting-balance support: No upper extremity supported, Feet supported Sitting balance-Leahy Scale: Good Sitting balance - Comments: can sit EOB and cross legs to get to socks without LOB Standing balance support: No upper extremity supported, During functional activity Standing balance-Leahy Scale: Fair Standing balance comment: standing at sink to wash hands High level balance activites: Head turns, Direction changes, Other (comment) High Level Balance Comments: weaving of gait and start/stop with horizontal and vertical head turns, reports increased dizziness. Increased time to transition between fast/slow walking and to change directions 90 deg    Special needs/care consideration Skin surgical incision to head   Previous Home Environment (from acute therapy documentation) Living Arrangements: Alone  Lives With: Alone Available Help at Discharge: Family, Available 24 hours/day Type of Home: Apartment Home Layout: One level Home Access: Level entry Bathroom Shower/Tub: Multimedia programmer: Standard  Discharge Living Setting Plans for Discharge Living Setting: Other (Comment) (will d/c to parents home) Type of Home  at Discharge: House Discharge Home Layout: One level Discharge Home Access: Stairs to enter Entrance Stairs-Rails: Left Entrance Stairs-Number of Steps: 2-3 through garage Discharge Bathroom Shower/Tub: Tub/shower unit Discharge Bathroom Toilet: Standard Discharge Bathroom Accessibility: Yes How Accessible: Accessible via walker Does the patient have any problems obtaining your medications?: No  Social/Family/Support Systems Anticipated Caregiver: Mom and dad Anticipated Caregiver's Contact Information: Katharine Look (mom) [phone] Ability/Limitations of Caregiver: n/a Caregiver Availability: 24/7 Discharge Plan Discussed with Primary Caregiver: Yes Is Caregiver In Agreement with Plan?: Yes Does Caregiver/Family have Issues with Lodging/Transportation while Pt is in Rehab?: No  Goals Patient/Family Goal for Rehab: PT/OT/SLP supervision Expected length of stay: 12-14 days Pt/Family Agrees to Admission and willing to participate: Yes Program Orientation Provided & Reviewed with Pt/Caregiver Including Roles  & Responsibilities: Yes  Barriers to Discharge: Insurance for SNF coverage  Decrease burden of Care through IP rehab admission: n/a  Possible need for SNF placement upon discharge: Not anticipated  Patient Condition: I have reviewed medical records from [facility], spoken with CM, and patient and family member. I met with patient at the bedside and discussed via phone for inpatient rehabilitation assessment.  Patient will benefit from ongoing PT, OT, and SLP, can actively participate in 3 hours of therapy a day 5 days of the week, and can make measurable gains during the admission.  Patient will also benefit from the coordinated team approach during an Inpatient Acute Rehabilitation admission.  The patient will receive intensive therapy as well as Rehabilitation physician, nursing, social worker, and care management interventions.  Due to bladder management, bowel management, safety,  skin/wound care, disease management, medication administration, pain management, and patient education the patient requires 24 hour a day rehabilitation nursing.  The patient is currently min to mod assist with mobility and mod assist to max assist with basic ADLs.  Discharge setting and therapy post discharge at home with home health is anticipated.  Patient has agreed to participate in the Acute Inpatient Rehabilitation Program and will admit today.  Preadmission Screen Completed By:  Michel Santee, PT, DPT 04/01/2021 10:20 AM with updates by Clemens Catholic, MS, CCC-SLP  ______________________________________________________________________   Discussed status with Dr. Letta Pate on 04/01/21 at 10:20 AM and received approval for admission today.  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 Yes Co-Morbidities requiring supervision/potential complications: Hx of HTN, depression   Due to bladder management, bowel management, safety, skin/wound care, disease management, medication administration, pain management, and patient education, does the patient require 24 hr/day rehab nursing? Yes Does the patient require coordinated care of a physician, rehab nurse, PT, OT, and SLP to address physical and functional deficits in the context of the above medical diagnosis(es)? Yes Addressing deficits in the following areas: balance, endurance, locomotion, strength, transferring, bowel/bladder control, bathing, dressing, feeding, grooming, toileting, cognition, and psychosocial support Can the patient actively participate in an intensive therapy program of at least 3 hrs of therapy 5 days  a week? Yes The potential for patient to make measurable gains while on inpatient rehab is good Anticipated functional outcomes upon discharge from inpatient rehab: supervision PT, supervision OT, supervision SLP Estimated rehab length of stay to reach the above functional goals is: 12-14d Anticipated discharge destination: Home 10. Overall Rehab/Functional Prognosis: good   MD Signature: Charlett Blake M.D. [facility] Fellow Am Acad of Phys Med and Rehab Diplomate Am Board of Electrodiagnostic Med Fellow Am Board of Interventional Pain

## 2021-03-30 NOTE — Anesthesia Postprocedure Evaluation (Signed)
Anesthesia Post Note  Patient: Amanda Henry  Procedure(s) Performed: IR WITH ANESTHESIA (Coiling and Embolization)     Patient location during evaluation: PACU Anesthesia Type: General Level of consciousness: awake and alert Pain management: pain level controlled Vital Signs Assessment: post-procedure vital signs reviewed and stable Respiratory status: spontaneous breathing, nonlabored ventilation, respiratory function stable and patient connected to nasal cannula oxygen Cardiovascular status: blood pressure returned to baseline and stable Postop Assessment: no apparent nausea or vomiting Anesthetic complications: no   No notable events documented.  Last Vitals:  Vitals:   03/30/21 0002 03/30/21 0403  BP: (!) 157/92 (!) 162/97  Pulse: 70 68  Resp: 18 15  Temp: 37.2 C (!) 36.3 C  SpO2: 96% 96%    Last Pain:  Vitals:   03/30/21 0403  TempSrc: Oral  PainSc:                  [place]

## 2021-03-30 NOTE — Progress Notes (Signed)
Subjective: Patient reports headaches come and go, currently no pain  Objective: Vital signs in last 24 hours: Temp:  [97.4 F (36.3 C)-99.5 F (37.5 C)] 98.5 F (36.9 C) (11/12 0801) Pulse Rate:  [67-73] 73 (11/12 0801) Resp:  [10-18] 14 (11/12 0801) BP: (141-174)/(61-99) 174/99 (11/12 0801) SpO2:  [96 %-97 %] 97 % (11/12 0801)  Intake/Output from previous day: No intake/output data recorded. Intake/Output this shift: No intake/output data recorded.  Awake, alert, oriented, No pronator drift Stapled right frontal small incision  Lab Results: No results for input(s): WBC, HGB, HCT, PLT in the last 72 hours. BMET Recent Labs    03/27/21 1349  NA 137  K 2.8*  CL 102  CO2 26  GLUCOSE 167*  BUN 11  CREATININE 0.87  CALCIUM 13.8*    Studies/Results: VAS [place] TRANSCRANIAL DOPPLER  Result Date: 03/29/2021  Transcranial Doppler Patient Name:  Amanda Henry  Date of Exam:   03/29/2021 Medical Rec #: [id number]         Accession #:    [id number] Date of Birth: 11-Feb-1966         Patient Gender: F Patient Age:   55 years Exam Location:  [facility] Procedure:      VAS [place] TRANSCRANIAL DOPPLER Referring Phys: Amanda Henry --------------------------------------------------------------------------------  Indications: Subarachnoid hemorrhage. Limitations: Very difficult and limited exam due to patient continuously moving. Comparison Study: Previous exam 11/ Performing Technologist: Amanda Henry RVT, RDMS  Examination Guidelines: A complete evaluation includes B-mode imaging, spectral Doppler, color Doppler, and power Doppler as needed of all accessible portions of each vessel. Bilateral testing is considered an integral part of a complete examination. Limited examinations for reoccurring indications may be performed as noted.  +----------+-------------+----------+-----------+-------+ RIGHT TCD Right VM (cm)Depth (cm)PulsatilityComment  +----------+-------------+----------+-----------+-------+ MCA           65.00                 1.16            +----------+-------------+----------+-----------+-------+ ACA          -14.00                 1.28            +----------+-------------+----------+-----------+-------+ Term ICA      65.00                 1.11            +----------+-------------+----------+-----------+-------+ PCA           24.00                 1.07            +----------+-------------+----------+-----------+-------+ Opthalmic     23.00                 1.11            +----------+-------------+----------+-----------+-------+ ICA siphon    21.00                 1.40            +----------+-------------+----------+-----------+-------+ Vertebral    -37.00                 1.06            +----------+-------------+----------+-----------+-------+ Distal ICA    30.00                 1.18            +----------+-------------+----------+-----------+-------+  +----------+------------+----------+-----------+-------------+  LEFT TCD  Left VM (cm)Depth (cm)Pulsatility   Comment    +----------+------------+----------+-----------+-------------+ MCA          61.00                 0.98                  +----------+------------+----------+-----------+-------------+ ACA                                        not insonated +----------+------------+----------+-----------+-------------+ Term ICA                                   not insonated +----------+------------+----------+-----------+-------------+ PCA          30.00                 1.02                  +----------+------------+----------+-----------+-------------+ Opthalmic                                  not insonated +----------+------------+----------+-----------+-------------+ ICA siphon   18.00                 1.31                  +----------+------------+----------+-----------+-------------+ Vertebral     -24.00                1.28                  +----------+------------+----------+-----------+-------------+ Distal ICA   31.00                 1.16                  +----------+------------+----------+-----------+-------------+  +------------+-----+-------------+             VM cm   Comment    +------------+-----+-------------+ Prox Basilar     not insonated +------------+-----+-------------+ Dist Basilar     not insonated +------------+-----+-------------+ +----------------------+----+ Right [place]ard Ratio2.17 +----------------------+----+ +---------------------+----+ Left [place]ard Ratio1.97 +---------------------+----+    Preliminary     Assessment/Plan: S/p coiling of ruptured AChor aneurysm - awaiting CIR bed - BMP and Ca+ recheck 11/14   Amanda Henry 03/30/2021, 10:31 AM

## 2021-03-31 NOTE — Progress Notes (Signed)
Patient ID: Amanda Henry, female   DOB: 07-Sep-1965, 55 y.o.   MRN: [id number] BP (!) 147/76 (BP Location: Right Arm)   Pulse 66   Temp 98.2 F (36.8 C) (Oral)   Resp 18   Ht 5\' 8"  (1.727 m)   Wt 83.9 kg   LMP 08/28/2016 (Exact Date) Comment: come back on 09/15/2016  SpO2 98%   BMI 28.13 kg/m   Alert and oriented x 4, some word finding difficulties Moving all extremities well Awaiting placement

## 2021-04-01 ENCOUNTER — Other Ambulatory Visit (HOSPITAL_COMMUNITY): Payer: BC Managed Care – PPO

## 2021-04-01 DIAGNOSIS — I609 Nontraumatic subarachnoid hemorrhage, unspecified: Secondary | ICD-10-CM

## 2021-04-01 LAB — BASIC METABOLIC PANEL
Anion gap: 5 (ref 5–15)
BUN: 11 mg/dL (ref 6–20)
CO2: 29 mmol/L (ref 22–32)
Calcium: >15 mg/dL (ref 8.9–10.3)
Chloride: 103 mmol/L (ref 98–111)
Creatinine, Ser: 0.88 mg/dL (ref 0.44–1.00)
GFR, Estimated: >60 mL/min (ref >60)
Glucose, Bld: 135 mg/dL — ABNORMAL HIGH (ref 70–99)
Potassium: 3.4 mmol/L — ABNORMAL LOW (ref 3.5–5.1)
Sodium: 137 mmol/L (ref 135–145)

## 2021-04-01 LAB — MAGNESIUM: Magnesium: 2.2 mg/dL (ref 1.7–2.4)

## 2021-04-01 LAB — PHOSPHORUS: Phosphorus: 3 mg/dL (ref 2.5–4.6)

## 2021-04-01 MED ORDER — METOPROLOL TARTRATE 25 MG PO TABS: 25.0000 mg | ORAL_TABLET | Freq: Every day | ORAL | 0 refills | Status: DC

## 2021-04-01 MED ORDER — NIMODIPINE 30 MG PO CAPS: 60.0000 mg | ORAL_CAPSULE | ORAL | 0 refills | Status: DC

## 2021-04-01 NOTE — Progress Notes (Signed)
Physical Therapy Treatment Patient Details Name: Amanda Henry MRN: [id number] DOB: 1965/08/31 Today's Date: 04/01/2021   History of Present Illness This 55 y.o. female admitted with sudden onset severe HA.  She was found to have [place].  Pt with increased lethargy and decreased responsiveness 10/30. she was found to have developing hydrocephalus and underwent EVD placement. 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   PMH includes:  anxiety, depression, HTN    PT Comments    Pt received in supine, agreeable to therapy session and with good participation and tolerance for gait and transfer training in room. Gait distance limited due to pt c/o dizziness with increased distance and BP taken, pt noted to be orthostatic upon standing, RN notified.  Orthostatic BPs Sitting 155/96 (108) HR 70 bpm  Standing 118/61 (80) HR 79 bpm  Sitting after standing 135/118 (125) likely inaccurate, pt had moved her arm; HR 72 bpm  Pt needing up to minA for gait with unilateral hand-held assist and min guard for static standing without assistive device. Pt with continued slow processing and cognitive deficit and continues to benefit from PT services to progress toward functional mobility goals. She remains a high fall risk, needing 73 seconds to perform 5 x STS activity, norm time for her age group is 7.7 to 9.6 seconds and older adults are at risk of recurrent falls with scores greater than 15 seconds (see [email or web address].org).    Recommendations for follow up therapy are one component of a multi-disciplinary discharge planning process, led by the attending physician.  Recommendations may be updated based on patient status, additional functional criteria and insurance authorization.  Follow Up Recommendations  Acute inpatient rehab (3hours/day)     Assistance Recommended at Discharge Frequent or constant Supervision/Assistance  Equipment Recommendations  Rolling walker (2 wheels)     Recommendations for Other Services       Precautions / Restrictions Precautions Precautions: Fall Precaution Comments: watch BP -OH Restrictions Weight Bearing Restrictions: No     Mobility  Bed Mobility Overal bed mobility: Needs Assistance Bed Mobility: Supine to Sit;Sit to Supine     Supine to sit: Min guard Sit to supine: Min guard   General bed mobility comments: use of bed rail, greatly increased time to initiate, assist with blankets needed to perform    Transfers Overall transfer level: Needs assistance Equipment used: Rolling walker (2 wheels);None Transfers: Sit to/from Stand Sit to Stand: Min guard           General transfer comment: pt performed 5 x STS in 73 seconds, needs greatly increased time to initiate/perform transfers and fair carryover of safety cues within session this afternoon    Ambulation/Gait Ambulation/Gait assistance: Min assist;+2 safety/equipment Gait Distance (Feet): 60 Feet Assistive device: Rolling walker (2 wheels);None Gait Pattern/deviations: Step-through pattern;Staggering left;Staggering right Gait velocity: decreased     General Gait Details: 47ft with RW and min guard to minA, seated break, then 50ft with no AD and unilateral HHA with consistent minA needed and increased lateral drift, slow pace. Pt reports dizziness toward end of second gait trial and found to be orthostatic.   Stairs             Wheelchair Mobility    Modified Rankin (Stroke Patients Only) Modified Rankin (Stroke Patients Only) Pre-Morbid Rankin Score: No symptoms Modified Rankin: Moderately severe disability     Balance Overall balance assessment: Needs assistance Sitting-balance support: No upper extremity supported;Feet supported Sitting balance-Leahy Scale: Good  Standing balance support: No upper extremity supported;During functional activity Standing balance-Leahy Scale: Fair Standing balance comment: standing at sink to wash  hands and to don mask, able to static stand with min guard but needs minA for dynamic standing tasks.                            Cognition Arousal/Alertness: Awake/alert Behavior During Therapy: WFL for tasks assessed/performed Overall Cognitive Status: Impaired/Different from baseline Area of Impairment: Attention;Safety/judgement;Awareness;Problem solving                   Current Attention Level: Sustained Memory: Decreased short-term memory   Safety/Judgement: Decreased awareness of safety;Decreased awareness of deficits Awareness: Emergent Problem Solving: Requires verbal cues;Requires tactile cues;Slow processing General Comments: Pt requiring increased time to initiate and perform all mobility tasks and is aware she is not at baseline. Pt dizzy but does not let staff know immediately.        Exercises Other Exercises Other Exercises: STS x 5 (in 73 seconds) with arms crossed at chest Other Exercises: seated BLE AROM: hip flexion, LAQ, ankle pumps x10 reps ea    General Comments General comments (skin integrity, edema, etc.): BP 155/96 (108) seated EOB; HR 70; standing BP 118/61 (80) HR 79, RN notified      Pertinent Vitals/Pain Pain Assessment: No/denies pain Faces Pain Scale: Hurts a little bit Pain Location: "sometimes I get a headache but not now" Pain Descriptors / Indicators: Aching;Sore Pain Intervention(s): Monitored during session;Repositioned     PT Goals (current goals can now be found in the care plan section) Acute Rehab PT Goals Patient Stated Goal: to move better PT Goal Formulation: With patient Time For Goal Achievement: 03/31/21 Potential to Achieve Goals: Good Progress towards PT goals: Progressing toward goals    Frequency    Min 4X/week      PT Plan Current plan remains appropriate    AM-PAC PT "6 Clicks" Mobility   Outcome Measure  Help needed turning from your back to your side while in a flat bed without using  bedrails?: A Little Help needed moving from lying on your back to sitting on the side of a flat bed without using bedrails?: A Little Help needed moving to and from a bed to a chair (including a wheelchair)?: A Little Help needed standing up from a chair using your arms (e.g., wheelchair or bedside chair)?: A Lot (mod cues) Help needed to walk in hospital room?: A Lot (mod cues, +2 for safety) Help needed climbing 3-5 steps with a railing? : A Lot 6 Click Score: 15    End of Session Equipment Utilized During Treatment: Gait belt Activity Tolerance: Patient tolerated treatment well Patient left: in bed;with call bell/phone within reach;with bed alarm set Nurse Communication: Mobility status;Other (comment) (dizzy and orthostatic hypotension) PT Visit Diagnosis: Unsteadiness on feet (R26.81);Muscle weakness (generalized) (M62.81);Other abnormalities of gait and mobility (R26.89);Other symptoms and signs involving the nervous system (R29.898)     Time: 4401-0272 PT Time Calculation (min) (ACUTE ONLY): 34 min  Charges:  $Gait Training: 8-22 mins $Therapeutic Activity: 8-22 mins                     Meghanne Pletz P., PTA Acute Rehabilitation Services Pager: [phone] Office: [place] 04/01/2021, 5:22 PM

## 2021-04-01 NOTE — TOC Transition Note (Signed)
Transition of Care [facility]) - CM/SW Discharge Note   Patient Details  Name: Amanda Henry MRN: [id number] Date of Birth: 04-Nov-1965  [facility] CM/SW Contact:  Pollie Friar, RN Phone Number: 04/01/2021, 12:00 PM   Clinical Narrative:    Patient is discharging to CIR today. CM signing off.   Final next level of care: IP Rehab Facility Barriers to Discharge: No Barriers Identified   Patient Goals and CMS Choice        Discharge Placement                       Discharge Plan and Services                                     Social Determinants of Health (SDOH) Interventions     Readmission Risk Interventions No flowsheet data found.

## 2021-04-01 NOTE — Plan of Care (Signed)
  Problem: Education: Goal: Knowledge of disease or condition will improve Outcome: Progressing Goal: Knowledge of secondary prevention will improve (SELECT ALL) Outcome: Progressing Goal: Knowledge of patient specific risk factors will improve (INDIVIDUALIZE FOR PATIENT) Outcome: Progressing   Problem: Spontaneous Subarachnoid Hemorrhage Tissue Perfusion: Goal: Complications of Spontaneous Subarachnoid Hemorrhage will be minimized Outcome: Progressing

## 2021-04-01 NOTE — H&P (Signed)
Physical Medicine and Rehabilitation Admission H&P        Chief Complaint  Patient presents with   Functional deficits due to aneurysm rupture      HPI:  Amanda Henry is a 55 year old female with history of HTN, iron deficiency anemia, hypothyroid who was admitted on 03/15/21 with reports of worst HA of her life follwoed yb blurry vision and photophobia. CT head done revealing diffuse SAH and CTA head showed diffusely attenuated vessel. She underwent diagnostic cerebral angiogram which was negative for aneurysm, AVM or fistulas. She continued to have HA and developed obtundation on 10/29 with follow up CT head showing no change in diffuse SAH but progressive dilatation of lateral, third and 4th ventricles with developing hydrocephalus therefore ventric drain placed at bedside with improvement neurologically. She was started on nimotop and continued to have issues with mild HA. 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 Kathyrn Sheriff. 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 Follow up transcranial ultrasound without evidence of vasospasm. Therapy has been ongoing and patient continues to be limited by dizziness, cognitive deficits, staggering gait with fall risk    Review of Systems  Constitutional:  Negative for chills and fever.  HENT:  Negative for hearing loss and tinnitus.   Eyes:  Positive for blurred vision (vision comes and goes).  Respiratory:  Negative for shortness of breath.   Cardiovascular:  Positive for palpitations (pain/anxiety). Negative for chest pain.  Gastrointestinal:  Negative for constipation and heartburn.  Genitourinary:  Positive for frequency. Negative for dysuria.  Musculoskeletal:  Positive for joint pain and myalgias (with cold weather).  Skin:  Negative for rash.  Neurological:   Positive for dizziness (has been going on for months), sensory change (at times--in left leg), weakness and headaches.  Psychiatric/Behavioral:  The patient has insomnia.           Past Medical History:  Diagnosis Date   Anxiety     Blood transfusion without reported diagnosis     Depression     Hypertension      no medications at this time   Hypothyroidism     Iron deficiency anemia     Thyroid disease             Past Surgical History:  Procedure Laterality Date   IR ANGIO INTRA EXTRACRAN SEL COM CAROTID INNOMINATE UNI L MOD SED   03/16/2021   IR ANGIO INTRA EXTRACRAN SEL COM CAROTID INNOMINATE UNI L MOD SED   03/26/2021   IR ANGIO INTRA EXTRACRAN SEL INTERNAL CAROTID UNI R MOD SED   03/15/2021   IR ANGIO INTRA EXTRACRAN SEL INTERNAL CAROTID UNI R MOD SED   03/26/2021   IR ANGIO INTRA EXTRACRAN SEL INTERNAL CAROTID UNI R MOD SED   03/25/2021   IR ANGIO VERTEBRAL SEL VERTEBRAL UNI L MOD SED   03/15/2021   IR ANGIO VERTEBRAL SEL VERTEBRAL UNI L MOD SED   03/26/2021   IR ANGIOGRAM FOLLOW UP STUDY   03/26/2021   IR ANGIOGRAM FOLLOW UP STUDY   03/26/2021   IR TRANSCATH/EMBOLIZ   03/26/2021   IR US GUIDE VASC ACCESS RIGHT   03/26/2021   LAPAROSCOPIC VAGINAL HYSTERECTOMY WITH SALPINGECTOMY Bilateral 10/01/2016    Procedure: LAPAROSCOPIC ASSISTED VAGINAL HYSTERECTOMY WITH SALPINGECTOMY;  Surgeon: Paula Compton, MD;  Location: [facility];  Service:  Gynecology;  Laterality: Bilateral;   RADIOLOGY WITH ANESTHESIA N/A 03/15/2021    Procedure: IR WITH ANESTHESIA;  Surgeon: Consuella Lose, MD;  Location: [facility];  Service: Radiology;  Laterality: N/A;   RADIOLOGY WITH ANESTHESIA N/A 03/26/2021    Procedure: IR WITH ANESTHESIA (Coiling and Embolization);  Surgeon: Consuella Lose, MD;  Location: 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 She  reports that she has never smoked. She has never used smokeless tobacco. She reports that she does not drink alcohol and does not use drugs.          Allergies  Allergen Reactions   Latex Rash   Penicillins Rash      Has patient had a PCN reaction causing immediate rash, facial/tongue/throat swelling, SOB or lightheadedness with hypotension: Yes Has patient had a PCN reaction causing severe rash involving mucus membranes or skin necrosis: No Has patient had a PCN reaction that required hospitalization No Has patient had a PCN reaction occurring within the last 10 years: No If all of the above answers are "NO", then may proceed with Cephalosporin use.              Medications Prior to Admission  Medication Sig Dispense Refill   acetaminophen (TYLENOL) 500 MG tablet Take 1,000 mg by mouth every 6 (six) hours as needed for moderate pain or headache.       ALPRAZolam (XANAX XR) 1 MG 24 hr tablet Take 1 mg by mouth at bedtime.       escitalopram (LEXAPRO) 10 MG tablet Take 1 tablet (10 mg total) by mouth daily. (Patient taking differently: Take 10 mg by mouth at bedtime.) 30 tablet 1   ipratropium (ATROVENT) 0.06 % nasal spray USE 2 SPRAYS IN EACH NOSTRIL THREE TIMES DAILY (Patient taking differently: Place 2 sprays into both nostrils 2 (two) times daily as needed for rhinitis.) 15 mL 2   levothyroxine (SYNTHROID) 150 MCG tablet TAKE 1 TABLET (150 MCG) BY MOUTH DAILY before breakfast (Patient taking differently: Take 150 mcg by mouth daily before breakfast.) 45 tablet 1   Vitamin D, Ergocalciferol, (DRISDOL) 1.25 MG (50000 UNIT) CAPS capsule TAKE 1 CAPSULE BY MOUTH 2 TIMES A WEEK (Patient taking differently: Take 50,000 Units by mouth See admin instructions. Monday and wednesday) 24  capsule 1   diclofenac Sodium (VOLTAREN) 1 % GEL Apply 2 g topically 4 (four) times daily. (Patient not taking: Reported on 03/15/2021) 100 g 2   ibuprofen (ADVIL,MOTRIN) 600 MG tablet Take 1 tablet (600 mg total) by mouth every 6 (six) hours as needed (mild pain). (Patient not taking: Reported on 03/15/2021) 30 tablet 0      Drug Regimen Review  Drug regimen was reviewed and remains appropriate with no significant issues identified   Home: Home Living Family/patient expects to be discharged to:: Private residence Living Arrangements: Alone Available Help at Discharge: Family, Available 24 hours/day Type of Home: Apartment Home Access: Level entry Home  Layout: One level Bathroom Shower/Tub: Multimedia programmer: Standard Home Equipment: None  Lives With: Alone   Functional History: Prior Function Prior Level of Function : Independent/Modified Independent, Driving, Working/employed ADLs 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 Status:  Mobility: Bed Mobility Overal bed mobility: Needs Assistance Bed Mobility: Supine to Sit Sidelying to sit: Min assist Supine to sit: Min assist Sit to supine: Supervision Sit to sidelying: Supervision General bed mobility comments: Pt up in recliner upon arrival Transfers Overall transfer level: Needs assistance Equipment used: Rolling walker (2 wheels) Transfers: Sit to/from Stand Sit to Stand: Min assist Stand pivot transfers: Min assist General transfer comment: VCs for safe hand placement, had to repeat them since she said okay but then started to do what we had told her not to Ambulation/Gait Ambulation/Gait assistance: Min assist Gait Distance (Feet): 50 Feet (x50, x100') Assistive device: Rolling walker (2 wheels) Gait Pattern/deviations: Step-through pattern, Staggering left, Staggering right General Gait Details: minA throughout ambulation. Required increased assist for walker with cognitive dual task.  Seated rest break x 1 and standing rest break x 2 during ambulation. Cues required for maintaining grip on RW and safe maneuvering of RW in hallway Gait velocity: decreased Gait velocity interpretation: <1.31 ft/sec, indicative of household ambulator   ADL: ADL Overall ADL's : Needs assistance/impaired Eating/Feeding: Independent Grooming: Wash/dry hands, Standing Grooming Details (indicate cue type and reason): soap dispenser at sink not working, pt looked at bottles on her sink counter and decided to go with no rinse cleanser to wash her hands. Upper Body Bathing: Minimal assistance, Sitting Lower Body Bathing: Moderate assistance, Sit to/from stand Upper Body Dressing : Moderate assistance, Sitting Lower Body Dressing: Moderate assistance, Sit to/from stand Lower Body Dressing Details (indicate cue type and reason): Can get to feet by crossing legs over one another aleternately at knee Toilet Transfer: Minimal assistance, Ambulation, Rolling walker (2 wheels), Grab bars, Comfort height toilet Toilet Transfer Details (indicate cue type and reason): pt required max cues to back up and step to Lt.  She required physical assist to maneuver RW to the Lt and to sit Toileting- Clothing Manipulation and Hygiene: Minimal assistance Functional mobility during ADLs: Minimal assistance, Rolling walker (2 wheels) General ADL Comments: drifted to R with ambulation, decreased expressive language abilities in session   Cognition: Cognition Overall Cognitive Status: Impaired/Different from baseline Arousal/Alertness: Awake/alert Orientation Level: Oriented X4 Attention: Sustained, Alternating Sustained Attention: Impaired Sustained Attention Impairment: Verbal complex, Functional complex Alternating Attention: Impaired Alternating Attention Impairment: Verbal complex, Functional complex Memory: Impaired Memory Impairment: Decreased short term memory, Decreased recall of new information Awareness:  Appears intact Problem Solving: Impaired Problem Solving Impairment: Verbal complex, Functional complex Executive Function: Organizing, Self Monitoring Organizing: Impaired Self Monitoring: Impaired Cognition Arousal/Alertness: Awake/alert Behavior During Therapy: Flat affect Overall Cognitive Status: Impaired/Different from baseline Area of Impairment: Attention, Safety/judgement, Awareness, Problem solving Orientation Level:  (not specifically tested) Current Attention Level: Sustained Memory: Decreased short-term memory Following Commands: Follows one step commands inconsistently, Follows one step commands with increased time Safety/Judgement: Decreased awareness of safety, Decreased awareness of deficits Awareness: Emergent Problem Solving: Requires verbal cues, Requires tactile cues General Comments: Pt seated in recliner, before standing we asked her to push up from her chair not grab the walker. She said okay, but then immediately grabbed the walker and needed verbal and gestural cues to use arms of chair to push up. While ambulating she was asked some multiplication problems (9x9, 9x6)--could not get  these on her own, (9x5)-increased time but got it, (9x10)-got it right away. 2nd time I asked her 9x9 took increased time but got it. 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 rate 17, height 5\' 8"  (1.727 m), weight 83.9 kg, last menstrual period 08/28/2016, SpO2 99 %. Physical Exam Vitals and nursing note reviewed.  Constitutional:      Appearance: Normal appearance.  Neurological:     Mental Status: She is alert and oriented to person, place, and time.      General: No acute distress Mood and affect are appropriate Heart: Regular rate and rhythm no rubs murmurs or extra sounds Lungs: Clear to  auscultation, breathing unlabored, no rales or wheezes Abdomen: Positive bowel sounds, soft nontender to palpation, nondistended Extremities: No clubbing, cyanosis, or edema Skin: No evidence of breakdown, no evidence of rash Neurologic: Cranial nerves II through XII intact, motor strength is 5/5 in bilateral deltoid, bicep, tricep, grip, hip flexor, knee extensors, ankle dorsiflexor and plantar flexor Pt with delayed processing, reduced word finding  Sensory exam normal sensation to light touch  bilateral upper and lower extremities Cerebellar exam normal finger to nose to finger as well as heel to shin in bilateral upper and lower extremities Musculoskeletal: Full range of motion in all 4 extremities. No joint swelling  Lab Results Last 48 Hours        Results for orders placed or performed during the hospital encounter of 03/15/21 (from the past 48 hour(s))  Basic metabolic panel     Status: Abnormal    Collection Time: 04/01/21  3:56 AM  Result Value Ref Range    Sodium 137 135 - 145 mmol/L    Potassium 3.4 (L) 3.5 - 5.1 mmol/L    Chloride 103 98 - 111 mmol/L    CO2 29 22 - 32 mmol/L    Glucose, Bld 135 (H) 70 - 99 mg/dL      Comment: Glucose reference range applies only to samples taken after fasting for at least 8 hours.    BUN 11 6 - 20 mg/dL    Creatinine, Ser 0.88 0.44 - 1.00 mg/dL    Calcium >15.0 (HH) 8.9 - 10.3 mg/dL      Comment: CRITICAL RESULT CALLED TO, READ BACK BY AND VERIFIED WITH: Stacie Acres RN [id number] 04/01/21 M KOROLESKI      GFR, Estimated >60 >60 mL/min      Comment: (NOTE) Calculated using the CKD-EPI Creatinine Equation (2021)      Anion gap 5 5 - 15      Comment: Performed at [facility] [address], [place], [place] [address]  Magnesium     Status: None    Collection Time: 04/01/21  3:56 AM  Result Value Ref Range    Magnesium 2.2 1.7 - 2.4 mg/dL      Comment: Performed at [facility] [address], [place], [place] [address]   Phosphorus     Status: None    Collection Time: 04/01/21  3:56 AM  Result Value Ref Range    Phosphorus 3.0 2.5 - 4.6 mg/dL      Comment: Performed at [facility] [address] [place], [place] [address]      Imaging Results (Last 48 hours)  No results found.           Medical Problem List and Plan: 1.  Decline in mobility and ADLs secondary to [facility]             -patient may  shower             -ELOS/Goals: 12-14d SUpervision with ADLs and mobility  2.  Antithrombotics: -DVT/anticoagulation:  Mechanical: Sequential compression devices, below knee Bilateral lower extremities             -antiplatelet therapy: N/A 3. Pain Management: hydrocodone prn mild HA and MSIR for severe HA 4. Mood: LCSW  to follow for evaluation and support.              -antipsychotic agents: N/A 5. Neuropsych: This patient is capable of making decisions on her own behalf. 6. Skin/Wound Care: Routine pressure relief 7. Fluids/Electrolytes/Nutrition: Check BMET, monitor for hypokalemia 8. HTN: Monitor BP TID--continue nimodipine and metoprolol 9. Hypokalemia: Will increase K dur to TID 10. Hypothyroid/hypercalcemia: On supplement --managed by Endocrine. 11. Diffuse myalgias: Will resume voltaren gel to knee/LLE.  12. Vascular HA may need trial of topiramate     Bary Leriche, PA-C 04/01/2021  "I have personally performed a face to face diagnostic evaluation of this patient.  Additionally, I have reviewed and concur with the physician assistant's documentation above." Charlett Blake M.D. [facility] Fellow Am [employer] of [employer] and Rehab Diplomate Am Board of Electrodiagnostic Med Fellow Am Board of Interventional Pain

## 2021-04-01 NOTE — Discharge Summary (Signed)
Physician Discharge Summary  Patient ID: Amanda Henry MRN: [id number] DOB/AGE: [birth year] 53 y.o.  Admit date: 03/15/2021 Discharge date: 04/01/2021  Admission Diagnoses:  Subarachnoid Hemorrhage  Discharge Diagnoses:  Same Active Problems:   SAH (subarachnoid hemorrhage) [facility])   Discharged Condition: Stable  Hospital Course:  Aleesa Sweigert is a 55 y.o. female Admitted to the hospital after sudden onset of severe headache.  Her initial CT scan demonstrated subarachnoid hemorrhage while initial angiogram was negative for intracranial aneurysm.  Patient was monitored in the intensive care unit and was noted to be somnolent two days after admission.  External ventricular drain was placed with significant improvement in her mental status.  She was monitored for another week without change in her neurologic exam.   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  She underwent coil embolization the next day uneventfully.  She remained at neurologic baseline despite more than 48 hours of EVD clamping.  External ventricular drain was therefore removed.  She remained well from a neurologic perspective.  She was evaluated multiple times by physical and occupational therapy and deemed to be a good candidate for inpatient rehab.  She was therefore discharged in stable condition.    Of note, patient was admitted during outpatient workup of hypercalcemia, presumably primary hyperparathyroidism.  Patient was scheduled to undergo thyroid ultrasound before she was admitted for the subarachnoid hemorrhage.  We did therefore obtain thyroid ultrasound demonstrating some thyroid nodules which will require outpatient follow-up.  Treatments: Surgery -   Diagnostic cerebral angiogram x2, coil embolization of right anterior choroidal artery aneurysm  Discharge Exam: Blood pressure 139/71, pulse 88, temperature 98.5 F (36.9 C),  temperature source Oral, resp. rate 17, height 5\' 8"  (1.727 m), weight 83.9 kg, last menstrual period 08/28/2016, SpO2 99 %. Awake, alert, oriented Speech fluent, appropriate CN grossly intact 5/5 BUE/BLE   Disposition: Discharge disposition: 02-Transferred to [facility]       Discharge Instructions     Call MD for:  redness, tenderness, or signs of infection (pain, swelling, redness, odor or green/yellow discharge around incision site)   Complete by: As directed    Call MD for:  temperature >100.4   Complete by: As directed    Diet - low sodium heart healthy   Complete by: As directed    Discharge instructions   Complete by: As directed    Walk at home as much as possible, at least 4 times / day   Increase activity slowly   Complete by: As directed    Lifting restrictions   Complete by: As directed    No lifting > 10 lbs   May shower / Bathe   Complete by: As directed    48 hours after surgery   May walk up steps   Complete by: As directed    No dressing needed   Complete by: As directed    Other Restrictions   Complete by: As directed    No bending/twisting at waist      Allergies as of 04/01/2021       Reactions   Latex Rash   Penicillins Rash   Has patient had a PCN reaction causing immediate rash, facial/tongue/throat swelling, SOB or lightheadedness with hypotension: Yes Has patient had a PCN reaction causing severe rash involving mucus membranes or skin necrosis: No Has patient had a PCN reaction that required hospitalization No Has patient had a PCN reaction occurring within the last 10  years: No If all of the above answers are "NO", then may proceed with Cephalosporin use.        Medication List     STOP taking these medications    diclofenac Sodium 1 % Gel Commonly known as: VOLTAREN       TAKE these medications    acetaminophen 500 MG tablet Commonly known as: TYLENOL Take 1,000 mg by mouth every 6 (six) hours as needed for  moderate pain or headache.   ALPRAZolam 1 MG 24 hr tablet Commonly known as: XANAX XR Take 1 mg by mouth at bedtime.   escitalopram 10 MG tablet Commonly known as: LEXAPRO Take 1 tablet (10 mg total) by mouth daily. What changed: when to take this   ibuprofen 600 MG tablet Commonly known as: ADVIL Take 1 tablet (600 mg total) by mouth every 6 (six) hours as needed (mild pain).   ipratropium 0.06 % nasal spray Commonly known as: ATROVENT USE 2 SPRAYS IN EACH NOSTRIL THREE TIMES DAILY What changed: See the new instructions.   levothyroxine 150 MCG tablet Commonly known as: SYNTHROID TAKE 1 TABLET (150 MCG) BY MOUTH DAILY before breakfast What changed:  how much to take how to take this when to take this additional instructions   metoprolol tartrate 25 MG tablet Commonly known as: LOPRESSOR Take 1 tablet (25 mg total) by mouth daily. Start taking on: April 02, 2021   niMODipine 30 MG capsule Commonly known as: NIMOTOP Take 2 capsules (60 mg total) by mouth every 4 (four) hours for 4 days.   Vitamin D (Ergocalciferol) 1.25 MG (50000 UNIT) Caps capsule Commonly known as: DRISDOL TAKE 1 CAPSULE BY MOUTH 2 TIMES A WEEK What changed:  how much to take how to take this when to take this additional instructions               Discharge Care Instructions  (From admission, onward)           Start     Ordered   04/01/21 0000  No dressing needed        04/01/21 1519            Follow-up Information     Consuella Lose, MD Follow up in 4 week(s).   Specialty: Neurosurgery Contact information: [address] [place] [address] [phone]         [name], FNP Follow up.   Specialty: General Practice Why: Follow up thyroid ultrasound Contact information: [address] [place] [address] [phone]                 Signed: Jairo Ben 04/01/2021, 3:20 PM

## 2021-04-01 NOTE — Progress Notes (Signed)
Occupational Therapy Treatment Patient Details Name: Amanda Henry MRN: [id number] DOB: November 13, 1965 Today's Date: 04/01/2021   History of present illness This 55 y.o. female admitted with sudden onset severe HA.  She was found to have [place].  Pt with increased lethargy and decreased responsiveness 10/30. she was found to have developing hydrocephalus and underwent EVD placement. 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   PMH includes:  anxiety, depression, HTN   OT comments  This 55 yo female making progress with mobility and ADLs. Still struggling with cognition. She will continue to benefit from acute OT with follow up on CIR.   Recommendations for follow up therapy are one component of a multi-disciplinary discharge planning process, led by the attending physician.  Recommendations may be updated based on patient status, additional functional criteria and insurance authorization.    Follow Up Recommendations  Acute inpatient rehab (3hours/day)    Assistance Recommended at Discharge Frequent or constant Supervision/Assistance  Equipment Recommendations  None recommended by OT    Recommendations for Other Services Rehab consult    Precautions / Restrictions Precautions Precautions: Fall Precaution Comments: watch BP--was orthostatic with RN this AM, but did not display with this during therapy session this PM       Mobility Bed Mobility Overal bed mobility: Independent             General bed mobility comments: Pt up in recliner upon arrival    Transfers Overall transfer level: Needs assistance Equipment used: None Transfers: Sit to/from Stand Sit to Stand: Min guard           General transfer comment: ambulation without RW min A     Balance Overall balance assessment: Needs assistance Sitting-balance support: No upper extremity supported;Feet supported Sitting balance-Leahy Scale: Good     Standing balance support: No upper extremity  supported;During functional activity Standing balance-Leahy Scale: Fair Standing balance comment: standing at sink to wash hands and brush teeth                           ADL either performed or assessed with clinical judgement   ADL Overall ADL's : Needs assistance/impaired     Grooming: Oral care;Standing;Min guard Grooming Details (indicate cue type and reason): She was able to ambulate from bed to sink without AD with min A                 Toilet Transfer: Minimal assistance;Ambulation;Grab bars;Comfort height toilet Toilet Transfer Details (indicate cue type and reason): Had on loss of balance as she came out of the bathroom to turn to sink but she was able to recover with a cross over step                Extremity/Trunk Assessment Upper Extremity Assessment Upper Extremity Assessment: Overall WFL for tasks assessed            Vision Patient Visual Report: No change from baseline            Cognition Arousal/Alertness: Awake/alert Behavior During Therapy: Flat affect Overall Cognitive Status: Impaired/Different from baseline Area of Impairment: Attention;Memory;Awareness;Problem solving                   Current Attention Level: Sustained Memory: Decreased short-term memory   Safety/Judgement: Decreased awareness of safety;Decreased awareness of deficits Awareness: Emergent Problem Solving: Requires verbal cues;Requires tactile cues General Comments: Asked pt some multiplication and division problems today at rest  in bed she got 9x9 right away but had to think on all the others and only got 25% of them correct that were muliplications of 6 and above. Was not able to tell me 36/9. Gave her 3 words to remember, she had immediate recall, but not at 3 and 5 minutes. Told her I was going to give her 3 movements to do, to listen to them, and then do them in the order I gave them to her--she could not do this.                      Pertinent Vitals/ Pain       Pain Assessment: Faces Faces Pain Scale: Hurts a little bit Pain Location: left knee Pain Descriptors / Indicators: Aching;Sore Pain Intervention(s): Limited activity within patient's tolerance;Monitored during session;Repositioned;Patient requesting pain meds-RN notified;RN gave pain meds during session         Frequency  Min 3X/week        Progress Toward Goals  OT Goals(current goals can now be found in the care plan section)  Progress towards OT goals: Progressing toward goals  Acute Rehab OT Goals Patient Stated Goal: to go to rehab today OT Goal Formulation: With patient Time For Goal Achievement: 04/01/21 Potential to Achieve Goals: Good  Plan Discharge plan remains appropriate;Frequency remains appropriate       AM-PAC OT "6 Clicks" Daily Activity     Outcome Measure   Help from another person eating meals?: None Help from another person taking care of personal grooming?: A Little Help from another person toileting, which includes using toliet, bedpan, or urinal?: A Little Help from another person bathing (including washing, rinsing, drying)?: A Little Help from another person to put on and taking off regular upper body clothing?: A Little Help from another person to put on and taking off regular lower body clothing?: A Little 6 Click Score: 19    End of Session Equipment Utilized During Treatment: Gait belt  OT Visit Diagnosis: Unsteadiness on feet (R26.81);Other symptoms and signs involving cognitive function;Pain Symptoms and signs involving cognitive functions: Nontraumatic SAH Pain - Right/Left: Left Pain - part of body:  (knee)   Activity Tolerance Patient tolerated treatment well   Patient Left in bed;with call bell/phone within reach;with bed alarm set           Time: 1400-1429 OT Time Calculation (min): 29 min  Charges: OT General Charges $OT Visit: 1 Visit OT Treatments $Self Care/Home Management :  23-37 mins  [address], OTR/L Acute [employer] Pager [phone] Office [phone]   Almon Register 04/01/2021, 4:25 PM

## 2021-04-01 NOTE — H&P (Signed)
Physical Medicine and Rehabilitation Admission H&P    Chief Complaint  Patient presents with   Functional deficits due SAH/aneurysm     HPI:  Amanda Henry is a 55 year old female with history of HTN, iron deficiency anemia, hypothyroid who was admitted on 03/15/21 with reports of worst HA of her life follwoed yb blurry vision and photophobia. CT head done revealing diffuse SAH and CTA head showed diffusely attenuated vessel. She underwent diagnostic cerebral angiogram which was negative for aneurysm, AVM or fistulas. She continued to have HA and developed obtundation on 10/29 with follow up CT head showing no change in diffuse SAH but progressive dilatation of lateral, third and 4th ventricles with developing hydrocephalus therefore ventric drain placed at bedside with improvement neurologically. She was started on nimotop and continued to have issues with mild HA. 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 Kathyrn Sheriff. 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 Follow up transcranial ultrasound without evidence of vasospasm. Therapy has been ongoing and patient continues to be limited by dizziness, cognitive deficits, staggering gait with fall risk   Pt reports always feels constipated due to her thyroid disease.  Peeing "too much" but chronic; LBM today- medium-  Feels "up and down"- c/o dizziness when up- Having HA on top of head- frontal- feels "open"- thinks due to BP being high.   Review of Systems  Constitutional:  Negative for chills and fever.  HENT:  Negative for hearing loss and tinnitus.   Eyes:  Positive for blurred vision (vision comes and goes).  Respiratory:  Negative for shortness of breath.   Cardiovascular:  Positive for palpitations (pain/anxiety). Negative for chest pain.   Gastrointestinal:  Positive for constipation. Negative for heartburn.  Genitourinary:  Positive for frequency. Negative for dysuria.  Musculoskeletal:  Positive for joint pain and myalgias (with cold weather).  Skin:  Negative for rash.  Neurological:  Positive for dizziness (has been going on for months), sensory change (at times--in left leg), weakness and headaches.  Psychiatric/Behavioral:  The patient has insomnia.   All other systems reviewed and are negative.   Past Medical History:  Diagnosis Date   Anxiety    Blood transfusion without reported diagnosis    Depression    Hypertension    no medications at this time   Hypothyroidism    Iron deficiency anemia    Thyroid disease     Past Surgical History:  Procedure Laterality Date   IR ANGIO INTRA EXTRACRAN SEL COM CAROTID INNOMINATE UNI L MOD SED  03/16/2021   IR ANGIO INTRA EXTRACRAN SEL COM CAROTID INNOMINATE UNI L MOD SED  03/26/2021   IR ANGIO INTRA EXTRACRAN SEL INTERNAL CAROTID UNI R MOD SED  03/15/2021   IR ANGIO INTRA EXTRACRAN SEL INTERNAL CAROTID UNI R MOD SED  03/26/2021   IR ANGIO INTRA EXTRACRAN SEL INTERNAL CAROTID UNI R MOD SED  03/25/2021   IR ANGIO VERTEBRAL SEL VERTEBRAL UNI L MOD SED  03/15/2021   IR ANGIO VERTEBRAL SEL VERTEBRAL UNI L MOD SED  03/26/2021   IR ANGIOGRAM FOLLOW UP STUDY  03/26/2021   IR ANGIOGRAM FOLLOW UP STUDY  03/26/2021   IR TRANSCATH/EMBOLIZ  03/26/2021   IR US GUIDE VASC ACCESS RIGHT  03/26/2021   LAPAROSCOPIC VAGINAL HYSTERECTOMY WITH SALPINGECTOMY Bilateral 10/01/2016   Procedure: LAPAROSCOPIC ASSISTED VAGINAL  HYSTERECTOMY WITH SALPINGECTOMY;  Surgeon: Paula Compton, MD;  Location: [facility];  Service: Gynecology;  Laterality: Bilateral;   RADIOLOGY WITH ANESTHESIA N/A 03/15/2021   Procedure: IR WITH ANESTHESIA;  Surgeon: Consuella Lose, MD;  Location: [facility];  Service: Radiology;  Laterality: N/A;   RADIOLOGY WITH ANESTHESIA N/A 03/26/2021   Procedure: IR WITH ANESTHESIA (Coiling  and Embolization);  Surgeon: Consuella Lose, MD;  Location: [facility];  Service: Radiology;  Laterality: N/A;   WISDOM TOOTH EXTRACTION      Family History  Problem Relation Age of Onset   Thyroid disease Mother    Hypertension Mother    Diabetes Father    Hypertension Other    Diabetes Other    Thyroid disease Other    Heart disease Other     Social History:  lives alone and  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 She  reports that she has never smoked. She has never used smokeless tobacco. She reports that she does not drink alcohol and does not use drugs.   Allergies  Allergen Reactions   Latex Rash   Penicillins Rash    Has patient had a PCN reaction causing immediate rash, facial/tongue/throat swelling, SOB or lightheadedness with hypotension: Yes Has patient had a PCN reaction causing severe rash involving mucus membranes or skin necrosis: No Has patient had a PCN reaction that required hospitalization No Has patient had a PCN reaction occurring within the last 10 years: No If all of the above answers are "NO", then may proceed with Cephalosporin use.     Medications Prior to Admission  Medication Sig Dispense Refill   acetaminophen (TYLENOL) 500 MG tablet Take 1,000 mg by mouth every 6 (six) hours as needed for moderate pain or headache.     ALPRAZolam (XANAX XR) 1 MG 24 hr tablet Take 1 mg by mouth at bedtime.     escitalopram (LEXAPRO) 10 MG tablet Take 1 tablet (10 mg total) by mouth daily. (Patient taking differently: Take 10 mg by mouth at bedtime.) 30 tablet 1   ipratropium (ATROVENT) 0.06 % nasal spray USE 2 SPRAYS IN EACH NOSTRIL THREE TIMES DAILY (Patient taking differently: Place 2 sprays into both nostrils 2 (two) times daily as needed for rhinitis.) 15 mL 2   levothyroxine (SYNTHROID) 150 MCG tablet TAKE 1 TABLET (150 MCG) BY MOUTH DAILY before breakfast (Patient taking differently: Take 150 mcg by mouth daily before breakfast.) 45 tablet  1   Vitamin D, Ergocalciferol, (DRISDOL) 1.25 MG (50000 UNIT) CAPS capsule TAKE 1 CAPSULE BY MOUTH 2 TIMES A WEEK (Patient taking differently: Take 50,000 Units by mouth See admin instructions. Monday and wednesday) 24 capsule 1   diclofenac Sodium (VOLTAREN) 1 % GEL Apply 2 g topically 4 (four) times daily. (Patient not taking: Reported on 03/15/2021) 100 g 2   ibuprofen (ADVIL,MOTRIN) 600 MG tablet Take 1 tablet (600 mg total) by mouth every 6 (six) hours as needed (mild pain). (Patient not taking: Reported on 03/15/2021) 30 tablet 0    Drug Regimen Review  Drug regimen was reviewed and remains appropriate with no significant issues identified  Home: Home Living Family/patient expects to be discharged to:: Private residence Living Arrangements: Alone Available Help at Discharge: Family, Available 24 hours/day Type of Home: Apartment Home Access: Level entry Home Layout: One level Bathroom Shower/Tub: Multimedia programmer: Standard Home Equipment: None  Lives With: Alone   Functional History: Prior Function Prior Level of Function : Independent/Modified Independent, Driving,  Working/employed ADLs Comments: pt works as a second Insurance underwriter Status:  Mobility: Bed Mobility Overal bed mobility: Needs Assistance Bed Mobility: Supine to Sit, Sit to Supine Sidelying to sit: Min assist Supine to sit: Min guard Sit to supine: Min guard Sit to sidelying: Supervision General bed mobility comments: use of bed rail, greatly increased time to initiate, assist with blankets needed to perform Transfers Overall transfer level: Needs assistance Equipment used: Rolling walker (2 wheels), None Transfers: Sit to/from Stand Sit to Stand: Min guard Stand pivot transfers: Min assist General transfer comment: pt performed 5 x STS in 73 seconds, needs greatly increased time to initiate/perform transfers and fair carryover of safety cues within session this  afternoon Ambulation/Gait Ambulation/Gait assistance: Min assist, +2 safety/equipment Gait Distance (Feet): 60 Feet Assistive device: Rolling walker (2 wheels), None Gait Pattern/deviations: Step-through pattern, Staggering left, Staggering right General Gait Details: 32ft with RW and min guard to minA, seated break, then 42ft with no AD and unilateral HHA with consistent minA needed and increased lateral drift, slow pace. Pt reports dizziness toward end of second gait trial and found to be orthostatic. Gait velocity: decreased Gait velocity interpretation: <1.31 ft/sec, indicative of household ambulator    ADL: ADL Overall ADL's : Needs assistance/impaired Eating/Feeding: Independent Grooming: Oral care, Standing, Min guard Grooming Details (indicate cue type and reason): She was able to ambulate from bed to sink without AD with min A Upper Body Bathing: Minimal assistance, Sitting Lower Body Bathing: Moderate assistance, Sit to/from stand Upper Body Dressing : Moderate assistance, Sitting Lower Body Dressing: Moderate assistance, Sit to/from stand Lower Body Dressing Details (indicate cue type and reason): Can get to feet by crossing legs over one another aleternately at knee Toilet Transfer: Minimal assistance, Ambulation, Grab bars, Comfort height toilet Toilet Transfer Details (indicate cue type and reason): Had on loss of balance as she came out of the bathroom to turn to sink but she was able to recover with a cross over step Toileting- Clothing Manipulation and Hygiene: Minimal assistance Functional mobility during ADLs: Minimal assistance, Rolling walker (2 wheels) General ADL Comments: drifted to R with ambulation, decreased expressive language abilities in session  Cognition: Cognition Overall Cognitive Status: Impaired/Different from baseline Arousal/Alertness: Awake/alert Orientation Level: Oriented X4 Attention: Sustained, Alternating Sustained Attention:  Impaired Sustained Attention Impairment: Verbal complex, Functional complex Alternating Attention: Impaired Alternating Attention Impairment: Verbal complex, Functional complex Memory: Impaired Memory Impairment: Decreased short term memory, Decreased recall of new information Awareness: Appears intact Problem Solving: Impaired Problem Solving Impairment: Verbal complex, Functional complex Executive Function: Organizing, Self Monitoring Organizing: Impaired Self Monitoring: Impaired Cognition Arousal/Alertness: Awake/alert Behavior During Therapy: WFL for tasks assessed/performed Overall Cognitive Status: Impaired/Different from baseline Area of Impairment: Attention, Safety/judgement, Awareness, Problem solving Orientation Level:  (not specifically tested) Current Attention Level: Sustained Memory: Decreased short-term memory Following Commands: Follows one step commands inconsistently, Follows one step commands with increased time Safety/Judgement: Decreased awareness of safety, Decreased awareness of deficits Awareness: Emergent Problem Solving: Requires verbal cues, Requires tactile cues, Slow processing General Comments: Pt requiring increased time to initiate and perform all mobility tasks and is aware she is not at baseline. Pt dizzy but does not let staff know immediately.   Blood pressure (!) 179/97, pulse 87, temperature 99 F (37.2 C), temperature source Oral, resp. rate 16, height 5\' 8"  (1.727 m), weight 83.9 kg, last menstrual period 08/28/2016, SpO2 99 %. Physical Exam Vitals and nursing note reviewed.  Constitutional:  Appearance: Normal appearance.     Comments: Pt laying supine in bed; appears younger than state age; wearing braids; c/o dizziness/HA; vague, NAD  HENT:     Head: Normocephalic and atraumatic.     Comments: Smile equal; tongue midline; staples x2 in R frontal area    Right Ear: External ear normal.     Left Ear: External ear normal.     Nose:  Nose normal. No congestion.     Mouth/Throat:     Mouth: Mucous membranes are moist.     Pharynx: Oropharynx is clear. No oropharyngeal exudate.  Eyes:     General:        Right eye: No discharge.        Left eye: No discharge.     Extraocular Movements: Extraocular movements intact.  Cardiovascular:     Rate and Rhythm: Normal rate and regular rhythm.     Heart sounds: Normal heart sounds. No murmur heard.   No gallop.  Pulmonary:     Effort: Pulmonary effort is normal. No respiratory distress.     Breath sounds: Normal breath sounds. No wheezing, rhonchi or rales.  Abdominal:     Comments: Slightly distended; hypoactive; NT; soft  Musculoskeletal:     Cervical back: Normal range of motion. No rigidity.     Comments: UE 4/5 B/L- robotic movements- slightly slowed 4+/5 B/L in LE's, except PF 5-/5 B/L   Skin:    Comments: R forearm IV- looks OK; no skin breakdown on heels; staples in R head/frontal; skin looks good  Neurological:     Mental Status: She is alert and oriented to person, place, and time.     Comments: Vague- tangential; but Ox3 technically Intact to light touch in all 4 extremities and face No increased tone/Hoffman's/clonus B/L   Psychiatric:     Comments: Flat affect; slowed/delayed responses    Results for orders placed or performed during the hospital encounter of 03/15/21 (from the past 48 hour(s))  Basic metabolic panel     Status: Abnormal   Collection Time: 04/01/21  3:56 AM  Result Value Ref Range   Sodium 137 135 - 145 mmol/L   Potassium 3.4 (L) 3.5 - 5.1 mmol/L   Chloride 103 98 - 111 mmol/L   CO2 29 22 - 32 mmol/L   Glucose, Bld 135 (H) 70 - 99 mg/dL    Comment: Glucose reference range applies only to samples taken after fasting for at least 8 hours.   BUN 11 6 - 20 mg/dL   Creatinine, Ser 0.88 0.44 - 1.00 mg/dL   Calcium >15.0 (HH) 8.9 - 10.3 mg/dL    Comment: CRITICAL RESULT CALLED TO, READ BACK BY AND VERIFIED WITH: Stacie Acres RN [id number] 04/01/21  M KOROLESKI    GFR, Estimated >60 >60 mL/min    Comment: (NOTE) Calculated using the CKD-EPI Creatinine Equation (2021)    Anion gap 5 5 - 15    Comment: Performed at [facility] [address] [place], [place] [address]  Magnesium     Status: None   Collection Time: 04/01/21  3:56 AM  Result Value Ref Range   Magnesium 2.2 1.7 - 2.4 mg/dL    Comment: Performed at [facility] [address] [place], [place] [address]  Phosphorus     Status: None   Collection Time: 04/01/21  3:56 AM  Result Value Ref Range   Phosphorus 3.0 2.5 - 4.6 mg/dL    Comment: Performed  at [facility], [place] [address] [place], [place] [address]   No results found.     Medical Problem List and Plan: 1.  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  Antithrombotics: -DVT/anticoagulation:  Mechanical: Sequential compression devices, below knee Bilateral lower extremities due to [facility]  -antiplatelet therapy: N/A 3. Pain Management: hydrocodone prn mild HA and MSIR for severe HA- if HA doesn't improve, might need daily preventative 4. Mood: LCSW  to follow for evaluation and support.   -antipsychotic agents: N/A 5. Neuropsych: This patient is capable of making decisions on her own behalf. 6. Skin/Wound Care: Routine pressure relief 7. Fluids/Electrolytes/Nutrition: Monitor I/O. Encourage fluid intake.  8. HTN: Monitor BP TID--continue nimodipine and metoprolol  --monitor for orthostatic changes/dizziness 9. Hypokalemia: Will increase K dur to TID 10. Hypothyroid/hypercalcemia: On supplement --managed by Endocrine. 11. Diffuse myalgias: Will resume voltaren gel to knee/LLE.  12. Dizziness- will monitor if needs vestibular evaluation by therapy.    I have personally performed a face to face diagnostic evaluation of this patient and formulated the key components of the plan.  Additionally, I have  personally reviewed laboratory data, imaging studies, as well as relevant notes and concur with the physician assistant's documentation above.   The patient's status has not changed from the original H&P.  Any changes in documentation from the acute care chart have been noted above.     Bary Leriche, PA-C 04/02/2021

## 2021-04-01 NOTE — Progress Notes (Signed)
Inpatient Rehab Admissions Coordinator:   Holding admit today as I no longer have a bed available.  Will f/u in the AM.   Shann Medal, PT, DPT Admissions Coordinator [phone] 04/01/21  4:15 PM

## 2021-04-01 NOTE — Progress Notes (Signed)
@  approx. 11, this RN notified of pt's critical Calcium (>15.0). Will convey to Day RN for Day Team but will not page on-call MD since pt's Calcium has been critical multiple times this admission.

## 2021-04-01 NOTE — Progress Notes (Signed)
Inpatient Rehab Admissions Coordinator:    I have insurance approval and a bed available for pt to admit to CIR today. Dr. Kathyrn Sheriff in agreement.  Will let pt/family and TOC team know.   Shann Medal, PT, DPT Admissions Coordinator [phone] 04/01/21  10:19 AM

## 2021-04-02 ENCOUNTER — Other Ambulatory Visit (HOSPITAL_COMMUNITY): Payer: BC Managed Care – PPO

## 2021-04-02 NOTE — Progress Notes (Signed)
Physical Therapy Treatment Patient Details Name: Amanda Henry MRN: [id number] DOB: Apr 18, 1966 Today's Date: 04/02/2021   History of Present Illness This 55 y.o. female admitted with sudden onset severe HA.  She was found to have [place].  Pt with increased lethargy and decreased responsiveness 10/30. she was found to have developing hydrocephalus and underwent EVD placement. 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   PMH includes:  anxiety, depression, HTN    PT Comments    Pt received in supine, agreeable to therapy session and with good participation and tolerance for exercises and transfer/gait training. Pt needing min to modA for gait progression short distances without assistive device and reliant on up to modA via HHA due to lateral instability. Pt continues to benefit from PT services to progress toward functional mobility goals. BP stable during PT session, see orthostatics in flowsheet.  Recommendations for follow up therapy are one component of a multi-disciplinary discharge planning process, led by the attending physician.  Recommendations may be updated based on patient status, additional functional criteria and insurance authorization.  Follow Up Recommendations  Acute inpatient rehab (3hours/day)     Assistance Recommended at Discharge Frequent or constant Supervision/Assistance  Equipment Recommendations  Rolling walker (2 wheels)    Recommendations for Other Services       Precautions / Restrictions Precautions Precautions: Fall Precaution Comments: watch BP -OH Restrictions Weight Bearing Restrictions: No     Mobility  Bed Mobility Overal bed mobility: Needs Assistance Bed Mobility: Supine to Sit;Sit to Supine     Supine to sit: Min guard Sit to supine: Min guard   General bed mobility comments: pt reliant on bed rail to raise trunk, greatly increased time to initiate, assist with blankets needed to perform    Transfers Overall  transfer level: Needs assistance   Transfers: Sit to/from Stand Sit to Stand: Min guard           General transfer comment: from EOB and toilet heights, HHA from bed and used wall rail from toilet    Ambulation/Gait Ambulation/Gait assistance: Min assist;+2 safety/equipment;Mod assist Gait Distance (Feet): 50 Feet (108ft, seated break, 58ft, seated break, 51ft) Assistive device: 1 person hand held assist;2 person hand held assist Gait Pattern/deviations: Step-through pattern;Decreased stride length;Drifts right/left;Narrow base of support Gait velocity: decreased     General Gait Details: Unilateral HHA with consistent min to modA needed for longer gait trial, pt with increased lateral drift, slow pace. Pt BP taken standing after 5 mins gait and not dizzy and BP stable. Pt reports feeling more secure with +2 HHA.     Modified Rankin (Stroke Patients Only) Modified Rankin (Stroke Patients Only) Pre-Morbid Rankin Score: No symptoms Modified Rankin: Moderately severe disability     Balance Overall balance assessment: Needs assistance Sitting-balance support: No upper extremity supported;Feet supported Sitting balance-Leahy Scale: Good     Standing balance support: No upper extremity supported;During functional activity Standing balance-Leahy Scale: Fair Standing balance comment: standing at sink to wash hands and to don mask, able to static stand with min guard but needs minA for dynamic standing tasks.            Cognition Arousal/Alertness: Awake/alert Behavior During Therapy: WFL for tasks assessed/performed Overall Cognitive Status: Impaired/Different from baseline Area of Impairment: Attention;Safety/judgement;Awareness;Problem solving           Current Attention Level: Sustained Memory: Decreased short-term memory   Safety/Judgement: Decreased awareness of safety;Decreased awareness of deficits Awareness: Emergent Problem Solving: Requires verbal  cues;Requires tactile cues;Slow processing General Comments: Pt requiring increased time to initiate and perform all mobility tasks and is aware she is not at baseline. Pt reports feeling like "something is in my ear" but no dizziness, BP fairly stable.        Exercises Other Exercises Other Exercises: supine BLE hamstring stretch 60 seconds ea Other Exercises: reclined BLE AROM: ankle pumps, quad sets, glute sets x3 reps ea to reinforce Other Exercises: supine and seated BUE AROM: chest press x 10-15 reps ea position (2 sets) and seated LAQ x10 reps ea    General Comments General comments (skin integrity, edema, etc.): BP stable 151/89 standing at bedside initially then 137/89 standing toward end of gait trial ~5 mins after first reading      Pertinent Vitals/Pain Pain Assessment: No/denies pain Pain Intervention(s): Monitored during session;Repositioned     PT Goals (current goals can now be found in the care plan section) Acute Rehab PT Goals Patient Stated Goal: to move better PT Goal Formulation: With patient Time For Goal Achievement: 04/06/21 (incorrect date put in previously -should be 14 days out) Progress towards PT goals: Progressing toward goals    Frequency    Min 4X/week      PT Plan Current plan remains appropriate       AM-PAC PT "6 Clicks" Mobility   Outcome Measure  Help needed turning from your back to your side while in a flat bed without using bedrails?: A Little Help needed moving from lying on your back to sitting on the side of a flat bed without using bedrails?: A Little Help needed moving to and from a bed to a chair (including a wheelchair)?: A Little Help needed standing up from a chair using your arms (e.g., wheelchair or bedside chair)?: A Lot (mod cues) Help needed to walk in hospital room?: A Lot (mod cues, +2 for safety) Help needed climbing 3-5 steps with a railing? : A Lot 6 Click Score: 15    End of Session Equipment Utilized  During Treatment: Gait belt Activity Tolerance: Patient tolerated treatment well Patient left: with call bell/phone within reach;in chair;Other (comment) (no chair alarm in room, pt able to demo back use of call bell and with fair awareness of deficits, RN OK to leave without chair alarm (also pending CIR placement today may DC soon)) Nurse Communication: Mobility status;Other (comment) (BP stable today, pt may need chair alarm pad) PT Visit Diagnosis: Unsteadiness on feet (R26.81);Muscle weakness (generalized) (M62.81);Other abnormalities of gait and mobility (R26.89);Other symptoms and signs involving the nervous system (R29.898)     Time: 6644-0347 PT Time Calculation (min) (ACUTE ONLY): 37 min  Charges:  $Gait Training: 8-22 mins $Therapeutic Activity: 8-22 mins                     Glessie Eustice P., PTA Acute Rehabilitation Services Pager: [phone] Office: [place] 04/02/2021, 1:32 PM

## 2021-04-02 NOTE — Progress Notes (Addendum)
PMR Admission Coordinator Pre-Admission Assessment   Patient: Amanda Henry is an 55 y.o., female MRN: [id number] DOB: 07-25-1965 Height: _0  (172.7 cm) Weight: 83.9 kg   Insurance Information HMO: no    PPO: yes     PCP:      IPA:      80/20:      OTHER:  PRIMARY: BCBS State      Policy#: [id number]      Subscriber:  CM Name: Marcie Bal       Phone#: [phone]     Fax#: [phone] Pre-Cert#: [id number] auth for CIR with updates due to fax listed above (date determined after admit)      Employer:  Benefits:  Phone #: [phone]     Name:  Eff. Date: 05/19/20     Deduct: $1500 ($0 met)      Out of Pocket Max: $5900 (met $2091.26)      Life Max: n/a CIR: 70%      SNF: 70% Outpatient:      Co-Pay: $72/visit Home Health: 70%      Co-Ins: 30% DME: 70%     Co-Ins: 30% Providers:  SECONDARY:       Policy#:      Phone#:    Development worker, community:       Phone#:    The Therapist, art Information Summary" for patients in Inpatient Rehabilitation Facilities with attached "Privacy Act [place] Records" was provided and verbally reviewed with: N/A   Emergency Contact Information Contact Information       Name Relation Home Work Mobile    [place] Mother [phone]   [phone]    [place] Father [phone]   [phone]    Carnesha, Maravilla     [phone]           Current Medical History  Patient Admitting Diagnosis: SAH with hydrocephalus    History of Present Illness: Pt is a 55 y/o female who presents to  on 10/28 with the worst headache of her life upon waking.  HA is throbbing, bifrontal and retro-orbital.  Complains of double/blurry vision.  CT head showed diffuse basal subarachnoid hemorrhage including blood in the perimesencephalic region and mild prominence of temporal horns without significant ventriculomegaly.  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   Neurosurgery consulted and recommended diagnostic cerebral angiogram which was negative.  Abrupt decline in status on 10/29 CT showed hydrocephalus so EVD placed at bedside.  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  Post op had some increased confusion, which has resolved.  EVD out on 11/9.  Therapy evaluations were completed and pt was recommended for CIR.    Complete NIHSS TOTAL: 0   Patient's medical record from [facility] has been reviewed by the rehabilitation admission coordinator and physician.   Past Medical History      Past Medical History:  Diagnosis Date   Anxiety     Blood transfusion without reported diagnosis     Depression     Hypertension      no medications at this time   Hypothyroidism     Iron deficiency anemia     Thyroid disease        Has the patient had major surgery during 100 days prior to admission? Yes   Family History   family history includes Diabetes in her father and another family member; Heart  disease in an other family member; Hypertension in her mother and another family member; Thyroid disease in her mother and another family member.   Current Medications   Current Facility-Administered Medications:    0.9 %  sodium chloride infusion, , Intravenous, Continuous, Consuella Lose, MD, Stopped at 03/26/21 1500   acetaminophen (TYLENOL) tablet 650 mg, 650 mg, Oral, Q4H PRN, 650 mg at 03/31/21 1940 **OR** acetaminophen (TYLENOL) 160 MG/5ML solution 650 mg, 650 mg, Per Tube, Q4H PRN **OR** acetaminophen (TYLENOL) suppository 650 mg, 650 mg, Rectal, Q4H PRN, Consuella Lose, MD   ALPRAZolam Duanne Moron) tablet 1 mg, 1 mg, Oral, QHS PRN, Consuella Lose, MD, 1 mg at 03/29/21 2215   docusate sodium (COLACE) capsule 100 mg, 100 mg, Oral, BID, Consuella Lose, MD, 100 mg at 04/01/21 0807   escitalopram (LEXAPRO) tablet 10 mg, 10 mg, Oral, QHS, Consuella Lose, MD, 10 mg at  03/31/21 1941   hydrALAZINE (APRESOLINE) injection 5 mg, 5 mg, Intravenous, Q6H PRN, Consuella Lose, MD, 5 mg at 03/27/21 0047   HYDROcodone-acetaminophen (NORCO/VICODIN) 5-325 MG per tablet 1-2 tablet, 1-2 tablet, Oral, Q4H PRN, Consuella Lose, MD, 2 tablet at 03/31/21 1638   influenza vac split quadrivalent PF (FLUARIX) injection 0.5 mL, 0.5 mL, Intramuscular, Prior to discharge, Meyran, Ocie Cornfield, NP   ipratropium (ATROVENT) 0.06 % nasal spray 2 spray, 2 spray, Each Nare, BID PRN, Consuella Lose, MD   labetalol (NORMODYNE) injection 10 mg, 10 mg, Intravenous, Q2H PRN, Consuella Lose, MD, 10 mg at 03/27/21 2331   levothyroxine (SYNTHROID) tablet 150 mcg, 150 mcg, Oral, QAC breakfast, Consuella Lose, MD, 150 mcg at 04/01/21 0645   metoprolol tartrate (LOPRESSOR) tablet 25 mg, 25 mg, Oral, Daily, Consuella Lose, MD, 25 mg at 04/01/21 0807   morphine 2 MG/ML injection 2 mg, 2 mg, Intravenous, Q2H PRN, Consuella Lose, MD, 2 mg at 03/26/21 1433   niMODipine (NIMOTOP) capsule 60 mg, 60 mg, Oral, Q4H, 60 mg at 04/01/21 0806 **OR** niMODipine (NYMALIZE) 6 MG/ML oral solution 60 mg, 60 mg, Per Tube, Q4H, Consuella Lose, MD   ondansetron (ZOFRAN-ODT) disintegrating tablet 4 mg, 4 mg, Oral, Q6H PRN, 4 mg at 03/28/21 1415 **OR** ondansetron (ZOFRAN) injection 4 mg, 4 mg, Intravenous, Q6H PRN, Consuella Lose, MD, 4 mg at 03/26/21 1405   pantoprazole (PROTONIX) EC tablet 40 mg, 40 mg, Oral, Daily, 40 mg at 04/01/21 0807 **OR** pantoprazole sodium (PROTONIX) 40 mg/20 mL oral suspension 40 mg, 40 mg, Per Tube, Daily, Nundkumar, Nena Polio, MD   potassium chloride SA (KLOR-CON) CR tablet 20 mEq, 20 mEq, Oral, BID, Consuella Lose, MD, 20 mEq at 04/01/21 0807   Vitamin D (Ergocalciferol) (DRISDOL) capsule 50,000 Units, 50,000 Units, Oral, Once per day on Mon Thu, Nundkumar, Nena Polio, MD, 50,000 Units at 03/28/21 0906   Patients Current Diet:  Diet Order                   Diet regular Room service appropriate? Yes with Assist; Fluid consistency: Thin  Diet effective now                         Precautions / Restrictions Precautions Precautions: Fall Precaution Comments: watch BP--was orthostatic with RN this AM, but did not display with this during therapy session this PM Restrictions Weight Bearing Restrictions: No    Has the patient had 2 or more falls or a fall with injury in the past year? No   Prior Activity Level Community (5-7x/wk): works full  time at baseline, still driving   Prior Functional Level Self Care: Did the patient need help bathing, dressing, using the toilet or eating? Independent   Indoor Mobility: Did the patient need assistance with walking from room to room (with or without device)? Independent   Stairs: Did the patient need assistance with internal or external stairs (with or without device)? Independent   Functional Cognition: Did the patient need help planning regular tasks such as shopping or remembering to take medications? Independent   Patient Information Are you of Hispanic, Latino/a,or Spanish origin?: A. No, not of Hispanic, Latino/a, or Spanish origin What is your race?: B. Black or African American Do you need or want an interpreter to communicate with a doctor or health care staff?: 0. No   Patient's Response To:  Health Literacy and Transportation Is the patient able to respond to health literacy and transportation needs?: Yes Health Literacy - How often do you need to have someone help you when you read instructions, pamphlets, or other written material from your doctor or pharmacy?: Never In the past 12 months, has lack of transportation kept you from medical appointments or from getting medications?: No In the past 12 months, has lack of transportation kept you from meetings, work, or from getting things needed for daily living?: No   Home Assistive Devices / Equipment Home Equipment: None   Prior  Device Use: Indicate devices/aids used by the patient prior to current illness, exacerbation or injury? None of the above   Current Functional Level Cognition   Arousal/Alertness: Awake/alert Overall Cognitive Status: Impaired/Different from baseline Current Attention Level: Sustained Orientation Level: Oriented X4 Following Commands: Follows one step commands inconsistently, Follows one step commands with increased time Safety/Judgement: Decreased awareness of safety, Decreased awareness of deficits General Comments: Pt seated in recliner, before standing we asked her to push up from her chair not grab the walker. She said okay, but then immediately grabbed the walker and needed verbal and gestural cues to use arms of chair to push up. While ambulating she was asked some multiplication problems (9x9, 9x6)--could not get these on her own, (9x5)-increased time but got it, (9x10)-got it right away. 2nd time I asked her 9x9 took increased time but got it. 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" Attention: Sustained, Alternating Sustained Attention: Impaired Sustained Attention Impairment: Verbal complex, Functional complex Alternating Attention: Impaired Alternating Attention Impairment: Verbal complex, Functional complex Memory: Impaired Memory Impairment: Decreased short term memory, Decreased recall of new information Awareness: Appears intact Problem Solving: Impaired Problem Solving Impairment: Verbal complex, Functional complex Executive Function: Organizing, Self Monitoring Organizing: Impaired Self Monitoring: Impaired    Extremity Assessment (includes Sensation/Coordination)   Upper Extremity Assessment: LUE deficits/detail RUE Deficits / Details: grossly 4/5 LUE Deficits / Details: Moving arm well, but did have to look to get left hand back on walker after she took it off to scratch her nose. LUE  Coordination: decreased gross motor  Lower Extremity Assessment: Defer to PT evaluation RLE Deficits / Details: grossly 4+/5 LLE Deficits / Details: grossly 4/5     ADLs   Overall ADL's : Needs assistance/impaired Eating/Feeding: Independent Grooming: Wash/dry hands, Standing Grooming Details (indicate cue type and reason): soap dispenser at sink not working, pt looked at bottles on her sink counter and decided to go with no rinse cleanser to wash her hands. Upper Body Bathing: Minimal assistance, Sitting  Lower Body Bathing: Moderate assistance, Sit to/from stand Upper Body Dressing : Moderate assistance, Sitting Lower Body Dressing: Moderate assistance, Sit to/from stand Lower Body Dressing Details (indicate cue type and reason): Can get to feet by crossing legs over one another aleternately at knee Toilet Transfer: Minimal assistance, Ambulation, Rolling walker (2 wheels), Grab bars, Comfort height toilet Toilet Transfer Details (indicate cue type and reason): pt required max cues to back up and step to Lt.  She required physical assist to maneuver RW to the Lt and to sit Toileting- Clothing Manipulation and Hygiene: Minimal assistance Functional mobility during ADLs: Minimal assistance, Rolling walker (2 wheels) General ADL Comments: drifted to R with ambulation, decreased expressive language abilities in session     Mobility   Overal bed mobility: Needs Assistance Bed Mobility: Supine to Sit Sidelying to sit: Min assist Supine to sit: Min assist Sit to supine: Supervision Sit to sidelying: Supervision General bed mobility comments: Pt up in recliner upon arrival     Transfers   Overall transfer level: Needs assistance Equipment used: Rolling walker (2 wheels) Transfers: Sit to/from Stand Sit to Stand: Min assist Stand pivot transfers: Min assist General transfer comment: VCs for safe hand placement, had to repeat them since she said okay but then started to do what we had told  her not to     Ambulation / Gait / Stairs / Wheelchair Mobility   Ambulation/Gait Ambulation/Gait assistance: Herbalist (Feet): 50 Feet (x50, x100') Assistive device: Rolling walker (2 wheels) Gait Pattern/deviations: Step-through pattern, Staggering left, Staggering right General Gait Details: minA throughout ambulation. Required increased assist for walker with cognitive dual task. Seated rest break x 1 and standing rest break x 2 during ambulation. Cues required for maintaining grip on RW and safe maneuvering of RW in hallway Gait velocity: decreased Gait velocity interpretation: <1.31 ft/sec, indicative of household ambulator     Posture / Balance Dynamic Sitting Balance Sitting balance - Comments: can sit EOB and cross legs to get to socks without LOB Balance Overall balance assessment: Needs assistance Sitting-balance support: No upper extremity supported, Feet supported Sitting balance-Leahy Scale: Good Sitting balance - Comments: can sit EOB and cross legs to get to socks without LOB Standing balance support: No upper extremity supported, During functional activity Standing balance-Leahy Scale: Fair Standing balance comment: standing at sink to wash hands High level balance activites: Head turns, Direction changes, Other (comment) High Level Balance Comments: weaving of gait and start/stop with horizontal and vertical head turns, reports increased dizziness. Increased time to transition between fast/slow walking and to change directions 90 deg     Special needs/care consideration Skin surgical incision to head    Previous Home Environment (from acute therapy documentation) Living Arrangements: Alone  Lives With: Alone Available Help at Discharge: Family, Available 24 hours/day Type of Home: Apartment Home Layout: One level Home Access: Level entry Bathroom Shower/Tub: Multimedia programmer: Standard   Discharge Living Setting Plans for Discharge  Living Setting: Other (Comment) (will d/c to parents home) Type of Home at Discharge: House Discharge Home Layout: One level Discharge Home Access: Stairs to enter Entrance Stairs-Rails: Left Entrance Stairs-Number of Steps: 2-3 through garage Discharge Bathroom Shower/Tub: Tub/shower unit Discharge Bathroom Toilet: Standard Discharge Bathroom Accessibility: Yes How Accessible: Accessible via walker Does the patient have any problems obtaining your medications?: No   Social/Family/Support Systems Anticipated Caregiver: Mom and dad Anticipated Caregiver's Contact Information: Katharine Look (mom) [phone] Ability/Limitations of Caregiver: n/a Caregiver Availability: 24/7 Discharge  Plan Discussed with Primary Caregiver: Yes Is Caregiver In Agreement with Plan?: Yes Does Caregiver/Family have Issues with Lodging/Transportation while Pt is in Rehab?: No   Goals Patient/Family Goal for Rehab: PT/OT/SLP supervision Expected length of stay: 12-14 days Pt/Family Agrees to Admission and willing to participate: Yes Program Orientation Provided & Reviewed with Pt/Caregiver Including Roles  & Responsibilities: Yes  Barriers to Discharge: Insurance for SNF coverage   Decrease burden of Care through IP rehab admission: n/a   Possible need for SNF placement upon discharge: Not anticipated   Patient Condition: I have reviewed medical records from [facility], spoken with CM, and patient and family member. I met with patient at the bedside and discussed via phone for inpatient rehabilitation assessment.  Patient will benefit from ongoing PT, OT, and SLP, can actively participate in 3 hours of therapy a day 5 days of the week, and can make measurable gains during the admission.  Patient will also benefit from the coordinated team approach during an Inpatient Acute Rehabilitation admission.  The patient will receive intensive therapy as well as Rehabilitation physician, nursing, social worker, and care  management interventions.  Due to bladder management, bowel management, safety, skin/wound care, disease management, medication administration, pain management, and patient education the patient requires 24 hour a day rehabilitation nursing.  The patient is currently min to mod assist with mobility and mod assist to max assist with basic ADLs.  Discharge setting and therapy post discharge at home with home health is anticipated.  Patient has agreed to participate in the Acute Inpatient Rehabilitation Program and will admit today.   Preadmission Screen Completed By:  Michel Santee, PT, DPT 04/01/2021 10:20 AM with updates by Clemens Catholic, MS, CCC-SLP   ______________________________________________________________________   Discussed status with Dr. Letta Pate on 04/01/21 at 10:20 AM and received approval for admission today.   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 Yes Co-Morbidities requiring supervision/potential complications: Hx of HTN, depression    Due to bladder management, bowel management, safety, skin/wound care, disease management, medication administration, pain management, and patient education, does the patient require 24 hr/day rehab nursing? Yes Does the patient require coordinated care of a physician, rehab nurse, PT, OT, and SLP to address physical and functional deficits in the context of the above medical diagnosis(es)? Yes Addressing deficits in the following areas: balance, endurance, locomotion, strength, transferring, bowel/bladder control, bathing, dressing, feeding, grooming, toileting, cognition, and psychosocial support Can the patient  actively participate in an intensive therapy program of at least 3 hrs of therapy 5 days a week? Yes The potential for patient to make measurable gains while on inpatient rehab is good Anticipated functional outcomes upon discharge from inpatient rehab: supervision PT, supervision OT, supervision SLP Estimated rehab length of stay to reach the above functional goals is: 12-14d Anticipated discharge destination: Home 10. Overall Rehab/Functional Prognosis: good     MD Signature: Charlett Blake M.D. [facility] Fellow Am Acad of [employer] Med and Rehab Diplomate Am Board of Electrodiagnostic Med Fellow Am Board of Interventional Pain

## 2021-04-02 NOTE — Progress Notes (Signed)
Inpatient Rehab Admissions Coordinator:  ° °I have a bed for this Pt. On CIR today. RN may call report to [phone] after 12pm. ° °Arrington Yohe, MS, CCC-SLP °Rehab Admissions Coordinator  °[phone] (celll) °[phone] (office) ° °

## 2021-04-03 ENCOUNTER — Inpatient Hospital Stay (HOSPITAL_COMMUNITY)
Admission: AD | Admit: 2021-04-03 | Discharge: 2021-04-08 | Disposition: A | Discharge status: Home / Self Care | Payer: BC Managed Care – PPO | Source: Ambulatory Visit | Attending: Internal Medicine | Admitting: Internal Medicine

## 2021-04-03 ENCOUNTER — Inpatient Hospital Stay (HOSPITAL_COMMUNITY)
Admission: RE | Admit: 2021-04-03 | Discharge: 2021-04-03 | DRG: 057 | Disposition: A | Discharge status: Other Acute Inpatient Hospital | Payer: BC Managed Care – PPO | Source: Intra-hospital | Attending: Physical Medicine and Rehabilitation | Admitting: Physical Medicine and Rehabilitation

## 2021-04-03 ENCOUNTER — Other Ambulatory Visit: Payer: Self-pay

## 2021-04-03 DIAGNOSIS — F418 Other specified anxiety disorders: Secondary | ICD-10-CM | POA: Diagnosis present

## 2021-04-03 DIAGNOSIS — Z833 Family history of diabetes mellitus: Secondary | ICD-10-CM | POA: Diagnosis not present

## 2021-04-03 DIAGNOSIS — Z91040 Latex allergy status: Secondary | ICD-10-CM

## 2021-04-03 DIAGNOSIS — Z8679 Personal history of other diseases of the circulatory system: Secondary | ICD-10-CM | POA: Diagnosis not present

## 2021-04-03 DIAGNOSIS — I69019 Unspecified symptoms and signs involving cognitive functions following nontraumatic subarachnoid hemorrhage: Secondary | ICD-10-CM | POA: Diagnosis present

## 2021-04-03 DIAGNOSIS — E213 Hyperparathyroidism, unspecified: Secondary | ICD-10-CM | POA: Diagnosis not present

## 2021-04-03 DIAGNOSIS — F419 Anxiety disorder, unspecified: Secondary | ICD-10-CM | POA: Diagnosis present

## 2021-04-03 DIAGNOSIS — I609 Nontraumatic subarachnoid hemorrhage, unspecified: Secondary | ICD-10-CM

## 2021-04-03 DIAGNOSIS — Z880 Allergy status to penicillin: Secondary | ICD-10-CM | POA: Diagnosis not present

## 2021-04-03 DIAGNOSIS — Z8249 Family history of ischemic heart disease and other diseases of the circulatory system: Secondary | ICD-10-CM | POA: Diagnosis not present

## 2021-04-03 DIAGNOSIS — E876 Hypokalemia: Secondary | ICD-10-CM

## 2021-04-03 DIAGNOSIS — Z90710 Acquired absence of both cervix and uterus: Secondary | ICD-10-CM | POA: Diagnosis not present

## 2021-04-03 DIAGNOSIS — I10 Essential (primary) hypertension: Secondary | ICD-10-CM | POA: Diagnosis present

## 2021-04-03 DIAGNOSIS — Z79890 Hormone replacement therapy (postmenopausal): Secondary | ICD-10-CM | POA: Diagnosis not present

## 2021-04-03 DIAGNOSIS — R2689 Other abnormalities of gait and mobility: Secondary | ICD-10-CM | POA: Diagnosis present

## 2021-04-03 DIAGNOSIS — H538 Other visual disturbances: Secondary | ICD-10-CM | POA: Diagnosis present

## 2021-04-03 DIAGNOSIS — Z79899 Other long term (current) drug therapy: Secondary | ICD-10-CM

## 2021-04-03 DIAGNOSIS — Z8349 Family history of other endocrine, nutritional and metabolic diseases: Secondary | ICD-10-CM

## 2021-04-03 DIAGNOSIS — E039 Hypothyroidism, unspecified: Secondary | ICD-10-CM | POA: Diagnosis present

## 2021-04-03 DIAGNOSIS — M542 Cervicalgia: Secondary | ICD-10-CM | POA: Diagnosis present

## 2021-04-03 DIAGNOSIS — G4700 Insomnia, unspecified: Secondary | ICD-10-CM | POA: Diagnosis present

## 2021-04-03 DIAGNOSIS — D509 Iron deficiency anemia, unspecified: Secondary | ICD-10-CM | POA: Diagnosis present

## 2021-04-03 DIAGNOSIS — I69098 Other sequelae following nontraumatic subarachnoid hemorrhage: Secondary | ICD-10-CM | POA: Diagnosis not present

## 2021-04-03 DIAGNOSIS — K5900 Constipation, unspecified: Secondary | ICD-10-CM | POA: Diagnosis present

## 2021-04-03 DIAGNOSIS — F32A Depression, unspecified: Secondary | ICD-10-CM | POA: Diagnosis present

## 2021-04-03 HISTORY — DX: Hypercalcemia: E83.52

## 2021-04-03 HISTORY — DX: Hypokalemia: E87.6

## 2021-04-03 LAB — CBC WITH DIFFERENTIAL/PLATELET
Abs Immature Granulocytes: 0.01 10*3/uL (ref 0.00–0.07)
Basophils Absolute: 0.1 10*3/uL (ref 0.0–0.1)
Basophils Relative: 1 %
Eosinophils Absolute: 0.2 10*3/uL (ref 0.0–0.5)
Eosinophils Relative: 3 %
HCT: 38.8 % (ref 36.0–46.0)
Hemoglobin: 12.8 g/dL (ref 12.0–15.0)
Immature Granulocytes: 0 %
Lymphocytes Relative: 41 %
Lymphs Abs: 2.6 10*3/uL (ref 0.7–4.0)
MCH: 29.4 pg (ref 26.0–34.0)
MCHC: 33 g/dL (ref 30.0–36.0)
MCV: 89 fL (ref 80.0–100.0)
Monocytes Absolute: 0.4 10*3/uL (ref 0.1–1.0)
Monocytes Relative: 6 %
Neutro Abs: 3 10*3/uL (ref 1.7–7.7)
Neutrophils Relative %: 49 %
Platelets: 379 10*3/uL (ref 150–400)
RBC: 4.36 MIL/uL (ref 3.87–5.11)
RDW: 13.4 % (ref 11.5–15.5)
WBC: 6.2 10*3/uL (ref 4.0–10.5)
nRBC: 0 % (ref 0.0–0.2)

## 2021-04-03 LAB — COMPREHENSIVE METABOLIC PANEL
ALT: 14 U/L (ref 0–44)
AST: 14 U/L — ABNORMAL LOW (ref 15–41)
Albumin: 3.5 g/dL (ref 3.5–5.0)
Alkaline Phosphatase: 118 U/L (ref 38–126)
Anion gap: 6 (ref 5–15)
BUN: 9 mg/dL (ref 6–20)
CO2: 27 mmol/L (ref 22–32)
Calcium: >15 mg/dL (ref 8.9–10.3)
Chloride: 104 mmol/L (ref 98–111)
Creatinine, Ser: 0.95 mg/dL (ref 0.44–1.00)
GFR, Estimated: >60 mL/min (ref >60)
Glucose, Bld: 156 mg/dL — ABNORMAL HIGH (ref 70–99)
Potassium: 3.5 mmol/L (ref 3.5–5.1)
Sodium: 137 mmol/L (ref 135–145)
Total Bilirubin: 0.5 mg/dL (ref 0.3–1.2)
Total Protein: 7.5 g/dL (ref 6.5–8.1)

## 2021-04-03 LAB — CALCIUM: Calcium: >15 mg/dL (ref 8.9–10.3)

## 2021-04-03 LAB — VITAMIN D 25 HYDROXY (VIT D DEFICIENCY, FRACTURES): Vit D, 25-Hydroxy: 113.24 ng/mL — ABNORMAL HIGH (ref 30–100)

## 2021-04-03 LAB — PHOSPHORUS: Phosphorus: 1.7 mg/dL — ABNORMAL LOW (ref 2.5–4.6)

## 2021-04-03 LAB — MAGNESIUM: Magnesium: 2 mg/dL (ref 1.7–2.4)

## 2021-04-03 MED ORDER — ALUM & MAG HYDROXIDE-SIMETH 200-200-20 MG/5ML PO SUSP: 30.0000 mL | ORAL | Status: DC | PRN

## 2021-04-03 MED ORDER — MORPHINE SULFATE 15 MG PO TABS
15.0000 mg | ORAL_TABLET | ORAL | Status: DC | PRN
  Administered 2021-04-03: 15 mg via ORAL
  Filled 2021-04-03: qty 1

## 2021-04-03 MED ORDER — POLYETHYLENE GLYCOL 3350 17 G PO PACK: 17.0000 g | PACK | Freq: Every day | ORAL | Status: DC | PRN

## 2021-04-03 MED ORDER — FLEET ENEMA 7-19 GM/118ML RE ENEM
1.0000 | ENEMA | Freq: Once | RECTAL | Status: DC | PRN
  Filled 2021-04-03: qty 1

## 2021-04-03 MED ORDER — SODIUM CHLORIDE 0.9 % IV BOLUS: 1000.0000 mL | Freq: Once | INTRAVENOUS | Status: DC

## 2021-04-03 MED ORDER — CALCITONIN (SALMON) 200 UNIT/ML IJ SOLN
300.0000 [IU] | Freq: Two times a day (BID) | INTRAMUSCULAR | Status: AC
  Administered 2021-04-03 – 2021-04-05 (×4): 300 [IU] via SUBCUTANEOUS
  Filled 2021-04-03 (×5): qty 1.5

## 2021-04-03 MED ORDER — NIMODIPINE 30 MG PO CAPS
60.0000 mg | ORAL_CAPSULE | ORAL | Status: DC
  Administered 2021-04-03 (×2): 60 mg via ORAL
  Filled 2021-04-03 (×2): qty 2

## 2021-04-03 MED ORDER — BLOOD PRESSURE CONTROL BOOK
Freq: Once | Status: AC
  Filled 2021-04-03: qty 1

## 2021-04-03 MED ORDER — MORPHINE SULFATE 15 MG PO TABS: 15.0000 mg | ORAL_TABLET | ORAL | Status: DC | PRN

## 2021-04-03 MED ORDER — FLEET ENEMA 7-19 GM/118ML RE ENEM: 1.0000 | ENEMA | Freq: Once | RECTAL | Status: DC | PRN

## 2021-04-03 MED ORDER — MUSCLE RUB 10-15 % EX CREA
TOPICAL_CREAM | CUTANEOUS | Status: DC | PRN
  Filled 2021-04-03: qty 85

## 2021-04-03 MED ORDER — BISACODYL 10 MG RE SUPP: 10.0000 mg | Freq: Every day | RECTAL | Status: DC | PRN

## 2021-04-03 MED ORDER — PANTOPRAZOLE SODIUM 40 MG PO TBEC
40.0000 mg | DELAYED_RELEASE_TABLET | Freq: Every day | ORAL | Status: DC
  Administered 2021-04-03: 40 mg via ORAL
  Filled 2021-04-03: qty 1

## 2021-04-03 MED ORDER — POTASSIUM CHLORIDE CRYS ER 20 MEQ PO TBCR
20.0000 meq | EXTENDED_RELEASE_TABLET | ORAL | Status: AC
  Administered 2021-04-03: 20 meq via ORAL
  Filled 2021-04-03: qty 1

## 2021-04-03 MED ORDER — PANTOPRAZOLE 2 MG/ML SUSPENSION
40.0000 mg | Freq: Every day | ORAL | Status: DC
  Filled 2021-04-03: qty 20

## 2021-04-03 MED ORDER — MUSCLE RUB 10-15 % EX CREA
TOPICAL_CREAM | CUTANEOUS | Status: DC | PRN
  Administered 2021-04-03: 1 via TOPICAL
  Filled 2021-04-03: qty 85

## 2021-04-03 MED ORDER — NIMODIPINE 6 MG/ML PO SOLN: 60.0000 mg | ORAL | Status: DC

## 2021-04-03 MED ORDER — PROCHLORPERAZINE EDISYLATE 10 MG/2ML IJ SOLN
5.0000 mg | Freq: Four times a day (QID) | INTRAMUSCULAR | Status: DC | PRN
Start: 2021-04-03 — End: 2021-04-03

## 2021-04-03 MED ORDER — PROCHLORPERAZINE MALEATE 5 MG PO TABS
5.0000 mg | ORAL_TABLET | Freq: Four times a day (QID) | ORAL | Status: DC | PRN
  Filled 2021-04-03: qty 2

## 2021-04-03 MED ORDER — EXERCISE FOR HEART AND HEALTH BOOK
Freq: Once | Status: AC
  Filled 2021-04-03: qty 1

## 2021-04-03 MED ORDER — PANTOPRAZOLE 2 MG/ML SUSPENSION: 40.0000 mg | Freq: Every day | ORAL | Status: DC

## 2021-04-03 MED ORDER — TRAZODONE HCL 50 MG PO TABS: 25.0000 mg | ORAL_TABLET | Freq: Every evening | ORAL | Status: DC | PRN

## 2021-04-03 MED ORDER — IPRATROPIUM BROMIDE 0.06 % NA SOLN: 2.0000 | Freq: Two times a day (BID) | NASAL | Status: DC | PRN

## 2021-04-03 MED ORDER — DOCUSATE SODIUM 100 MG PO CAPS
100.0000 mg | ORAL_CAPSULE | Freq: Two times a day (BID) | ORAL | Status: DC
  Administered 2021-04-03 – 2021-04-04 (×2): 100 mg via ORAL
  Filled 2021-04-03 (×4): qty 1

## 2021-04-03 MED ORDER — DICLOFENAC SODIUM 1 % EX GEL
2.0000 g | Freq: Four times a day (QID) | CUTANEOUS | Status: DC
  Administered 2021-04-03: 2 g via TOPICAL
  Filled 2021-04-03 (×2): qty 100

## 2021-04-03 MED ORDER — ESCITALOPRAM OXALATE 10 MG PO TABS
10.0000 mg | ORAL_TABLET | Freq: Every day | ORAL | Status: DC
  Administered 2021-04-03 – 2021-04-04 (×2): 10 mg via ORAL
  Filled 2021-04-03 (×2): qty 1

## 2021-04-03 MED ORDER — POTASSIUM CHLORIDE CRYS ER 20 MEQ PO TBCR: 20.0000 meq | EXTENDED_RELEASE_TABLET | Freq: Three times a day (TID) | ORAL | Status: DC

## 2021-04-03 MED ORDER — DOCUSATE SODIUM 100 MG PO CAPS
100.0000 mg | ORAL_CAPSULE | Freq: Two times a day (BID) | ORAL | Status: DC
  Administered 2021-04-03: 100 mg via ORAL
  Filled 2021-04-03: qty 1

## 2021-04-03 MED ORDER — SODIUM CHLORIDE 0.9 % IV SOLN: INTRAVENOUS | Status: DC

## 2021-04-03 MED ORDER — ALPRAZOLAM 0.5 MG PO TABS: 1.0000 mg | ORAL_TABLET | Freq: Every evening | ORAL | Status: DC | PRN

## 2021-04-03 MED ORDER — VITAMIN D (ERGOCALCIFEROL) 1.25 MG (50000 UNIT) PO CAPS: 50000.0000 [IU] | ORAL_CAPSULE | ORAL | Status: DC

## 2021-04-03 MED ORDER — DIPHENHYDRAMINE HCL 12.5 MG/5ML PO ELIX
12.5000 mg | ORAL_SOLUTION | Freq: Four times a day (QID) | ORAL | Status: DC | PRN
Start: 2021-04-03 — End: 2021-04-05
  Filled 2021-04-03: qty 10

## 2021-04-03 MED ORDER — GUAIFENESIN-DM 100-10 MG/5ML PO SYRP: 5.0000 mL | ORAL_SOLUTION | Freq: Four times a day (QID) | ORAL | Status: DC | PRN

## 2021-04-03 MED ORDER — HYDROCODONE-ACETAMINOPHEN 5-325 MG PO TABS
1.0000 | ORAL_TABLET | ORAL | Status: DC | PRN
Start: 2021-04-03 — End: 2021-04-05

## 2021-04-03 MED ORDER — ESCITALOPRAM OXALATE 10 MG PO TABS: 10.0000 mg | ORAL_TABLET | Freq: Every day | ORAL | Status: DC

## 2021-04-03 MED ORDER — LEVOTHYROXINE SODIUM 75 MCG PO TABS: 150.0000 ug | ORAL_TABLET | Freq: Every day | ORAL | Status: DC

## 2021-04-03 MED ORDER — PANTOPRAZOLE SODIUM 40 MG PO TBEC
40.0000 mg | DELAYED_RELEASE_TABLET | Freq: Every day | ORAL | Status: DC
  Administered 2021-04-04: 09:00:00 40 mg via ORAL
  Filled 2021-04-03 (×2): qty 1

## 2021-04-03 MED ORDER — PROCHLORPERAZINE MALEATE 5 MG PO TABS: 5.0000 mg | ORAL_TABLET | Freq: Four times a day (QID) | ORAL | Status: DC | PRN

## 2021-04-03 MED ORDER — METOPROLOL TARTRATE 25 MG PO TABS
25.0000 mg | ORAL_TABLET | Freq: Every day | ORAL | Status: DC
  Administered 2021-04-03: 25 mg via ORAL
  Filled 2021-04-03: qty 1

## 2021-04-03 MED ORDER — PROCHLORPERAZINE 25 MG RE SUPP: 12.5000 mg | Freq: Four times a day (QID) | RECTAL | Status: DC | PRN

## 2021-04-03 MED ORDER — ACETAMINOPHEN 325 MG PO TABS: 325.0000 mg | ORAL_TABLET | ORAL | Status: DC | PRN

## 2021-04-03 MED ORDER — CALCITONIN (SALMON) 200 UNIT/ML IJ SOLN
4.0000 [IU]/kg | Freq: Two times a day (BID) | INTRAMUSCULAR | Status: DC
  Filled 2021-04-03: qty 1.54

## 2021-04-03 MED ORDER — NIMODIPINE 30 MG PO CAPS
60.0000 mg | ORAL_CAPSULE | ORAL | Status: DC
  Administered 2021-04-03 – 2021-04-05 (×9): 60 mg via ORAL
  Filled 2021-04-03 (×16): qty 2

## 2021-04-03 MED ORDER — DICLOFENAC SODIUM 1 % EX GEL
2.0000 g | Freq: Four times a day (QID) | CUTANEOUS | Status: DC
  Administered 2021-04-03 – 2021-04-05 (×4): 2 g via TOPICAL
  Filled 2021-04-03 (×2): qty 100

## 2021-04-03 MED ORDER — PROCHLORPERAZINE 25 MG RE SUPP
12.5000 mg | Freq: Four times a day (QID) | RECTAL | Status: DC | PRN
  Filled 2021-04-03: qty 1

## 2021-04-03 MED ORDER — DIPHENHYDRAMINE HCL 12.5 MG/5ML PO ELIX: 12.5000 mg | ORAL_SOLUTION | Freq: Four times a day (QID) | ORAL | Status: DC | PRN

## 2021-04-03 MED ORDER — PROCHLORPERAZINE EDISYLATE 10 MG/2ML IJ SOLN
5.0000 mg | Freq: Four times a day (QID) | INTRAMUSCULAR | Status: DC | PRN
  Filled 2021-04-03: qty 2

## 2021-04-03 MED ORDER — LEVOTHYROXINE SODIUM 150 MCG PO TABS
150.0000 ug | ORAL_TABLET | Freq: Every day | ORAL | Status: DC
  Administered 2021-04-04 – 2021-04-05 (×2): 150 ug via ORAL
  Filled 2021-04-03 (×2): qty 2

## 2021-04-03 MED ORDER — ALPRAZOLAM 1 MG PO TABS: 1.0000 mg | ORAL_TABLET | Freq: Every evening | ORAL | Status: DC | PRN

## 2021-04-03 MED ORDER — TRAZODONE HCL 50 MG PO TABS
25.0000 mg | ORAL_TABLET | Freq: Every evening | ORAL | Status: DC | PRN
Start: 2021-04-03 — End: 2021-04-05
  Filled 2021-04-03: qty 1

## 2021-04-03 MED ORDER — ACETAMINOPHEN 325 MG PO TABS
325.0000 mg | ORAL_TABLET | ORAL | Status: DC | PRN
Start: 2021-04-03 — End: 2021-04-05

## 2021-04-03 MED ORDER — POTASSIUM CHLORIDE CRYS ER 20 MEQ PO TBCR
20.0000 meq | EXTENDED_RELEASE_TABLET | Freq: Three times a day (TID) | ORAL | Status: DC
  Administered 2021-04-03 (×2): 20 meq via ORAL
  Filled 2021-04-03 (×2): qty 1

## 2021-04-03 MED ORDER — METOPROLOL TARTRATE 25 MG PO TABS
25.0000 mg | ORAL_TABLET | Freq: Every day | ORAL | Status: DC
  Administered 2021-04-04 – 2021-04-05 (×2): 25 mg via ORAL
  Filled 2021-04-03 (×2): qty 1

## 2021-04-03 MED ORDER — HYDROCODONE-ACETAMINOPHEN 5-325 MG PO TABS: 1.0000 | ORAL_TABLET | ORAL | Status: DC | PRN

## 2021-04-03 NOTE — Consult Note (Signed)
Reason for Consult:  Hypercalcemia - possible parathyroid adenoma Referring Physician: Dr. Fuller Plan Endocrinologist - Dr. Philemon Kingdom  Amanda Henry is an 55 y.o. female.  HPI: This is a 55 year old female who was recently referred to Dr. Armandina Gemma in our practice for surgical evaluation of suspected primary hyperparathyroidism.  The patient has had hypercalcemia for over one year.  She has had a markedly elevated calcium level and an elevated intact PTH level of 163. 24-hour urine collection for calcium was elevated at 390. At the time of the office visit on 03/07/21, she had not had any imaging studies performed. Patient has noted significant fatigue. She has had depression. She notes bone and joint pain. She describes "brain fog". 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  There is no family history of parathyroid disease. Patient has had no prior head or neck surgery.  At that time, plans were made to proceed with imaging studies including ultrasound of the neck and nuclear medicine parathyroid scan.    Unfortunately, she presented to the emergency department on 03/15/21 with acute subarachnoid hemorrhage.  Angiogram was negative for intracranial aneurysm.  External ventricular drain was placed.  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  The drain was removed and the patient was discharged to rehab on 04/01/21.    During her hospitalization, she did undergo thyroid ultrasound on 03/20/21 which revealed a 2 cm right inferior thyroid nodule vs. Parathyroid adenoma.  Nuclear medicine parathyroid scan was recommended but has not yet been performed.    The patient was transferred to inpatient rehab on 04/01/21.  Her calcium was noted to be >15.0 today, but the patient reports no symptoms.  EKG had question of ST changes but cardiologist felt that there was no sign of STEMI.  [facility] has readmitted the  patient to the hospital for management of her hypercalcemia.  Surgery was consulted regarding possible parathyroid adenoma.  Past Medical History:  Diagnosis Date   Anxiety    Blood transfusion without reported diagnosis    Depression    Hypertension    no medications at this time   Hypothyroidism    Iron deficiency anemia    Thyroid disease     Past Surgical History:  Procedure Laterality Date   IR ANGIO INTRA EXTRACRAN SEL COM CAROTID INNOMINATE UNI L MOD SED  03/16/2021   IR ANGIO INTRA EXTRACRAN SEL COM CAROTID INNOMINATE UNI L MOD SED  03/26/2021   IR ANGIO INTRA EXTRACRAN SEL INTERNAL CAROTID UNI R MOD SED  03/15/2021   IR ANGIO INTRA EXTRACRAN SEL INTERNAL CAROTID UNI R MOD SED  03/26/2021   IR ANGIO INTRA EXTRACRAN SEL INTERNAL CAROTID UNI R MOD SED  03/25/2021   IR ANGIO VERTEBRAL SEL VERTEBRAL UNI L MOD SED  03/15/2021   IR ANGIO VERTEBRAL SEL VERTEBRAL UNI L MOD SED  03/26/2021   IR ANGIOGRAM FOLLOW UP STUDY  03/26/2021   IR ANGIOGRAM FOLLOW UP STUDY  03/26/2021   IR TRANSCATH/EMBOLIZ  03/26/2021   IR US GUIDE VASC ACCESS RIGHT  03/26/2021   LAPAROSCOPIC VAGINAL HYSTERECTOMY WITH SALPINGECTOMY Bilateral 10/01/2016   Procedure: LAPAROSCOPIC ASSISTED VAGINAL HYSTERECTOMY WITH SALPINGECTOMY;  Surgeon: Paula Compton, MD;  Location: [facility];  Service: Gynecology;  Laterality: Bilateral;   RADIOLOGY WITH ANESTHESIA N/A 03/15/2021   Procedure: IR WITH ANESTHESIA;  Surgeon: Consuella Lose, MD;  Location: [facility];  Service: Radiology;  Laterality: N/A;   RADIOLOGY WITH  ANESTHESIA N/A 03/26/2021   Procedure: IR WITH ANESTHESIA (Coiling and Embolization);  Surgeon: Consuella Lose, MD;  Location: [facility];  Service: Radiology;  Laterality: N/A;   WISDOM TOOTH EXTRACTION      Family History  Problem Relation Age of Onset   Thyroid disease Mother    Hypertension Mother    Diabetes Father    Hypertension Other    Diabetes Other    Thyroid disease Other    Heart disease Other      Social History:  reports that she has never smoked. She has never used smokeless tobacco. She reports that she does not drink alcohol and does not use drugs.  Allergies:  Allergies  Allergen Reactions   Latex Rash   Penicillins Rash    Has patient had a PCN reaction causing immediate rash, facial/tongue/throat swelling, SOB or lightheadedness with hypotension: Yes Has patient had a PCN reaction causing severe rash involving mucus membranes or skin necrosis: No Has patient had a PCN reaction that required hospitalization No Has patient had a PCN reaction occurring within the last 10 years: No If all of the above answers are "NO", then may proceed with Cephalosporin use.     Medications: Scheduled:  calcitonin  4 Units/kg Subcutaneous BID   diclofenac Sodium  2 g Topical QID   docusate sodium  100 mg Oral BID   escitalopram  10 mg Oral QHS   [START ON 04/04/2021] levothyroxine  150 mcg Oral QAC breakfast   [START ON 04/04/2021] metoprolol tartrate  25 mg Oral Daily   niMODipine  60 mg Oral Q4H   [START ON 04/04/2021] pantoprazole  40 mg Oral Daily   potassium chloride  20 mEq Oral STAT   Continuous:  sodium chloride     sodium chloride      Results for orders placed or performed during the hospital encounter of 04/03/21 (from the past 48 hour(s))  Comprehensive metabolic panel     Status: Abnormal   Collection Time: 04/03/21 10:56 AM  Result Value Ref Range   Sodium 137 135 - 145 mmol/L   Potassium 3.5 3.5 - 5.1 mmol/L   Chloride 104 98 - 111 mmol/L   CO2 27 22 - 32 mmol/L   Glucose, Bld 156 (H) 70 - 99 mg/dL    Comment: Glucose reference range applies only to samples taken after fasting for at least 8 hours.   BUN 9 6 - 20 mg/dL   Creatinine, Ser 0.95 0.44 - 1.00 mg/dL   Calcium >15.0 (HH) 8.9 - 10.3 mg/dL    Comment: CRITICAL RESULT CALLED TO, READ BACK BY AND VERIFIED WITH: P.LOVE,RN 04/03/2021 1150 DAVISB    Total Protein 7.5 6.5 - 8.1 g/dL   Albumin 3.5 3.5 -  5.0 g/dL   AST 14 (L) 15 - 41 U/L   ALT 14 0 - 44 U/L   Alkaline Phosphatase 118 38 - 126 U/L   Total Bilirubin 0.5 0.3 - 1.2 mg/dL   GFR, Estimated >60 >60 mL/min    Comment: (NOTE) Calculated using the CKD-EPI Creatinine Equation (2021)    Anion gap 6 5 - 15    Comment: Performed at [facility] [address], [place], [place] [address]  CBC WITH DIFFERENTIAL     Status: None   Collection Time: 04/03/21 10:56 AM  Result Value Ref Range   WBC 6.2 4.0 - 10.5 K/uL   RBC 4.36 3.87 - 5.11 MIL/uL   Hemoglobin 12.8 12.0 - 15.0 g/dL  HCT 38.8 36.0 - 46.0 %   MCV 89.0 80.0 - 100.0 fL   MCH 29.4 26.0 - 34.0 pg   MCHC 33.0 30.0 - 36.0 g/dL   RDW 13.4 11.5 - 15.5 %   Platelets 379 150 - 400 K/uL   nRBC 0.0 0.0 - 0.2 %   Neutrophils Relative % 49 %   Neutro Abs 3.0 1.7 - 7.7 K/uL   Lymphocytes Relative 41 %   Lymphs Abs 2.6 0.7 - 4.0 K/uL   Monocytes Relative 6 %   Monocytes Absolute 0.4 0.1 - 1.0 K/uL   Eosinophils Relative 3 %   Eosinophils Absolute 0.2 0.0 - 0.5 K/uL   Basophils Relative 1 %   Basophils Absolute 0.1 0.0 - 0.1 K/uL   Immature Granulocytes 0 %   Abs Immature Granulocytes 0.01 0.00 - 0.07 K/uL    Comment: Performed at [facility] [address], [place], [place] [address]  Phosphorus     Status: Abnormal   Collection Time: 04/03/21 12:08 PM  Result Value Ref Range   Phosphorus 1.7 (L) 2.5 - 4.6 mg/dL    Comment: Performed at [facility] [address], [place], [place] [address]  Magnesium     Status: None   Collection Time: 04/03/21 12:08 PM  Result Value Ref Range   Magnesium 2.0 1.7 - 2.4 mg/dL    Comment: Performed at [facility], [place] [address], [place], [place] [address]   CLINICAL DATA:  Primary hyperparathyroidism   EXAM: THYROID ULTRASOUND   TECHNIQUE: Ultrasound examination of the thyroid gland and adjacent soft tissues was performed.   COMPARISON:  None.   FINDINGS: Parenchymal Echotexture: Moderately  heterogenous   Isthmus: 3 mm   Right lobe: 2.7 x 2.0 x 1.7 cm   Left lobe: 3.3 x 0.8 x 0.7 cm   _________________________________________________________   Estimated total number of nodules >/= 1 cm: 1   Number of spongiform nodules >/=  2 cm not described below (TR1): 0   Number of mixed cystic and solid nodules >/= 1.5 cm not described below (Zeb): 0   _________________________________________________________   Nodule # 1:   Location: Right; Inferior   Maximum size: 2.0 cm; Other 2 dimensions: 1.7 x 1.8 cm   Composition: solid/almost completely solid (2)   Echogenicity: hypoechoic (2)   Shape: not taller-than-wide (0)   Margins: ill-defined (0)   Echogenic foci: none (0)   ACR TI-RADS total points: 4.   ACR TI-RADS risk category: TR4 (4-6 points).   ACR TI-RADS recommendations:   **Given size (>/= 1.5 cm) and appearance, fine needle aspiration of this moderately suspicious nodule should be considered based on TI-RADS criteria.   _________________________________________________________   Overall, the thyroid gland appears atrophic. No hypervascularity. No regional adenopathy.   IMPRESSION: 2 cm right inferior thyroid TR 4 nodule versus parathyroid nodule/adenoma (given the history of primary hyperparathyroidism). Correlate with nuclear medicine parathyroid scan.   The above is in keeping with the ACR TI-RADS recommendations - J Am Coll Radiol 2017;14:587-595.     Electronically Signed   By: Jerilynn Mages.  Shick M.D.   On: 03/20/2021 07:42    Review of Systems Constitutional:  Negative for chills and fever.  HENT:  Negative for hearing loss and tinnitus.   Eyes:  Positive for blurred vision (vision comes and goes).  Respiratory:  Negative for shortness of breath.   Cardiovascular:  Positive for palpitations (pain/anxiety). Negative for chest pain.  Gastrointestinal:  Negative for constipation and heartburn.  Genitourinary:  Positive for frequency. Negative  for dysuria.  Musculoskeletal:  Positive for joint pain and myalgias (with cold weather).  Skin:  Negative for rash.  Neurological:  Positive for dizziness (has been going on for months), sensory change (at times--in left leg), weakness and headaches.  Psychiatric/Behavioral:  The patient has insomnia.    Temperature 98.6 F (37 C), resp. rate 18, weight 77.7 kg, last menstrual period 08/28/2016. Physical Exam Constitutional:  WDWN in NAD, conversant, no obvious deformities; lying in bed comfortably Eyes:  Pupils equal, round; sclera anicteric; moist conjunctiva; no lid lag HENT:  Oral mucosa moist; good dentition  Neck:  No masses palpated, trachea midline; no thyromegaly Lungs:  CTA bilaterally; normal respiratory effort CV:  Regular rate and rhythm; no murmurs; extremities well-perfused with no edema Abd:  +bowel sounds, soft, non-tender, no palpable organomegaly; no palpable hernias Musc:  Unable to assess gait; no apparent clubbing or cyanosis in extremities Lymphatic:  No palpable cervical or axillary lymphadenopathy Skin:  Warm, dry; no sign of jaundice Psychiatric - alert and oriented x 4; calm mood and affect  Assessment/Plan: Hypercalcemia with chemical evidence of primary hyperparathyroidism  Ultrasound shows right thyroid nodule vs parathyroid adenoma  Recs:  Medical management of hypercalcemia, although she does not appear to be very symptomatic.  Nuclear medicine parathyroid scan - has been ordered; hopefully will be performed tomorrow. If this localizes to the right inferior parathyroid and shows adenoma, she will likely need parathyroidectomy, possibly on Friday.  Imogene Burn Tripp Goins 04/03/2021, 4:34 PM

## 2021-04-03 NOTE — Progress Notes (Signed)
Patient DC to CIR room 10 via WC at 0630 am.

## 2021-04-03 NOTE — Progress Notes (Addendum)
PT. With critical calcium of greater than 15. On call for [facility] paged to make aware. See new orders.

## 2021-04-03 NOTE — Progress Notes (Signed)
EKG with question of ST changes. Patient asymptomatic--no CP or SOB but reports that she has been having right neck pain since admission. Reached out to Dr. Terrence Dupont to evaluate EKG who did not feel changes secondary to STEMI.   Have also discussed patient with Dr. Harvest Forest who felt that  patient may need ICU but would like to see labs from today as well as input form Dr. Letta Median to decide if higher level of acuity of care needed.

## 2021-04-03 NOTE — Progress Notes (Signed)
Inpatient Rehabilitation Care Coordinator Assessment and Plan Patient Details  Name: Amanda Henry MRN: [id number] Date of Birth: [birth year]  Today's Date: 04/03/2021  Hospital Problems: Principal Problem:   Hypercalcemia Active Problems:   Depression with anxiety   Iron deficiency anemia   SAH (subarachnoid hemorrhage) (HCC)   Hypokalemia  Past Medical History:  Past Medical History:  Diagnosis Date   Anxiety    Blood transfusion without reported diagnosis    Depression    Hypertension    no medications at this time   Hypothyroidism    Iron deficiency anemia    Thyroid disease    Past Surgical History:  Past Surgical History:  Procedure Laterality Date   IR ANGIO INTRA EXTRACRAN SEL COM CAROTID INNOMINATE UNI L MOD SED  03/16/2021   IR ANGIO INTRA EXTRACRAN SEL COM CAROTID INNOMINATE UNI L MOD SED  03/26/2021   IR ANGIO INTRA EXTRACRAN SEL INTERNAL CAROTID UNI R MOD SED  03/15/2021   IR ANGIO INTRA EXTRACRAN SEL INTERNAL CAROTID UNI R MOD SED  03/26/2021   IR ANGIO INTRA EXTRACRAN SEL INTERNAL CAROTID UNI R MOD SED  03/25/2021   IR ANGIO VERTEBRAL SEL VERTEBRAL UNI L MOD SED  03/15/2021   IR ANGIO VERTEBRAL SEL VERTEBRAL UNI L MOD SED  03/26/2021   IR ANGIOGRAM FOLLOW UP STUDY  03/26/2021   IR ANGIOGRAM FOLLOW UP STUDY  03/26/2021   IR TRANSCATH/EMBOLIZ  03/26/2021   IR US GUIDE VASC ACCESS RIGHT  03/26/2021   LAPAROSCOPIC VAGINAL HYSTERECTOMY WITH SALPINGECTOMY Bilateral 10/01/2016   Procedure: LAPAROSCOPIC ASSISTED VAGINAL HYSTERECTOMY WITH SALPINGECTOMY;  Surgeon: Paula Compton, MD;  Location: [facility];  Service: Gynecology;  Laterality: Bilateral;   RADIOLOGY WITH ANESTHESIA N/A 03/15/2021   Procedure: IR WITH ANESTHESIA;  Surgeon: Consuella Lose, MD;  Location: [facility];  Service: Radiology;  Laterality: N/A;   RADIOLOGY WITH ANESTHESIA N/A 03/26/2021   Procedure: IR WITH ANESTHESIA (Coiling and Embolization);  Surgeon: Consuella Lose, MD;  Location: [facility];   Service: Radiology;  Laterality: N/A;   WISDOM TOOTH EXTRACTION     Social History:  reports that she has never smoked. She has never used smokeless tobacco. She reports that she does not drink alcohol and does not use drugs.  Family / Support Systems Marital Status: Single Patient Roles: Parent, Other (Comment) (Second grade teacher) Children: Son who is local Other Supports: Katharine Look & Victor-parents [phone]  [phone] cell Anticipated Caregiver: Parents Ability/Limitations of Caregiver: Both retired and able to assist her at [place] Availability: 24/7 Family Dynamics: Close knit with parents, sister and her son. All are very involved and supportive. 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   Abuse/Neglect Abuse/Neglect Assessment Can Be Completed: Yes Physical Abuse: Denies Verbal Abuse: Denies Sexual Abuse: Denies Exploitation of patient/patient's resources: Denies Self-Neglect: Denies  Patient response to: Social Isolation - How often do you feel lonely or isolated from those around you?: Never  Emotional Status Pt's affect, behavior and adjustment  status: Pt is motivated to improve and can feel she is getting stronger. She still has an headache and hopes this will go away eventually. She has always been independent and hopes to recover  and get back to this level. Recent Psychosocial Issues: healthy PTA Psychiatric History: History of depression/anixety takes medication for this and finds it is helpful. Feel pt would benefit from seeing neuro-psych while here Substance Abuse History: No issues  Patient / Family Perceptions, Expectations & Goals Pt/Family understanding of illness & functional limitations: Pt talks with the MD and feels has a good understanding of her condition and plan moving forward. She is taking each day at a time and looks at small gains. 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, hospital, Mom, daughter, sibling, neighbor, friend, etc Anticipated changes in roles/activities/participation: resume Pt/family expectations/goals: Pt states: " I am hopeful I will do well and recover from this, I am already doing better than when I first got to the hospital."  [employer]: None Premorbid Home Care/DME Agencies: None Transportation available at discharge: Self now will rely upon parents Is the patient able to respond to transportation needs?: Yes In the past 12 months, has lack of transportation kept you from medical appointments or from getting medications?: No In the past 12 months, has lack of transportation kept you from meetings, work, or from getting things needed for daily living?: No Resource referrals recommended: Neuropsychology  Discharge Planning Living Arrangements: Alone Support Systems: Children, Parent, Other relatives, Friends/neighbors Type of Residence: Private residence Insurance Resources: Multimedia programmer (specify) Printmaker) Financial Resources: Employment Financial Screen Referred: No Living Expenses: Education officer, community Management: Patient Does the patient have any problems obtaining your medications?: No Home Management: self Patient/Family Preliminary Plans: Plans to go to parents home where she can have 24/7 care if needed. She is hopeful she will not need this  at discharge. Aware being evaluated and goals being set for rehab stay. Care Coordinator Barriers to Discharge: Insurance for SNF coverage Care Coordinator Anticipated Follow Up Needs: HH/OP  Clinical Impression Pleasant motivated female who is moving well, but still has a headache. She plans to go to parents at discharge so someone will be there with her at discharge if needed. Will await therapy evaluations and work on discharge needs.  Elease Hashimoto 04/03/2021, 12:53 PM

## 2021-04-03 NOTE — Evaluation (Signed)
Occupational Therapy Assessment and Plan  Patient Details  Name: Amanda Henry MRN: [id number] Date of Birth: Oct 30, 1965  OT Diagnosis: cognitive deficits and muscle weakness (generalized) Rehab Potential: Rehab Potential (ACUTE ONLY): Excellent ELOS: 5-7 days   Today's Date: 04/03/2021 OT Individual Time: 1751-0258 OT Individual Time Calculation (min): 8 min     Hospital Problem: Principal Problem:   SAH (subarachnoid hemorrhage) ([place])   Past Medical History:  Past Medical History:  Diagnosis Date   Anxiety    Blood transfusion without reported diagnosis    Depression    Hypertension    no medications at this time   Hypothyroidism    Iron deficiency anemia    Thyroid disease    Past Surgical History:  Past Surgical History:  Procedure Laterality Date   IR ANGIO INTRA EXTRACRAN SEL COM CAROTID INNOMINATE UNI L MOD SED  03/16/2021   IR ANGIO INTRA EXTRACRAN SEL COM CAROTID INNOMINATE UNI L MOD SED  03/26/2021   IR ANGIO INTRA EXTRACRAN SEL INTERNAL CAROTID UNI R MOD SED  03/15/2021   IR ANGIO INTRA EXTRACRAN SEL INTERNAL CAROTID UNI R MOD SED  03/26/2021   IR ANGIO INTRA EXTRACRAN SEL INTERNAL CAROTID UNI R MOD SED  03/25/2021   IR ANGIO VERTEBRAL SEL VERTEBRAL UNI L MOD SED  03/15/2021   IR ANGIO VERTEBRAL SEL VERTEBRAL UNI L MOD SED  03/26/2021   IR ANGIOGRAM FOLLOW UP STUDY  03/26/2021   IR ANGIOGRAM FOLLOW UP STUDY  03/26/2021   IR TRANSCATH/EMBOLIZ  03/26/2021   IR US GUIDE VASC ACCESS RIGHT  03/26/2021   LAPAROSCOPIC VAGINAL HYSTERECTOMY WITH SALPINGECTOMY Bilateral 10/01/2016   Procedure: LAPAROSCOPIC ASSISTED VAGINAL HYSTERECTOMY WITH SALPINGECTOMY;  Surgeon: Paula Compton, MD;  Location: [facility];  Service: Gynecology;  Laterality: Bilateral;   RADIOLOGY WITH ANESTHESIA N/A 03/15/2021   Procedure: IR WITH ANESTHESIA;  Surgeon: Consuella Lose, MD;  Location: [place] [facility];  Service: Radiology;  Laterality: N/A;   RADIOLOGY WITH ANESTHESIA N/A 03/26/2021   Procedure: IR  WITH ANESTHESIA (Coiling and Embolization);  Surgeon: Consuella Lose, MD;  Location: [place];  Service: Radiology;  Laterality: N/A;   WISDOM TOOTH EXTRACTION      Assessment & Plan Clinical Impression: Amanda Henry is a 55 year old female with history of HTN, iron deficiency anemia, hypothyroid who was admitted on 03/15/21 with reports of worst HA of her life follwoed yb blurry vision and photophobia. CT head done revealing diffuse SAH and CTA head showed diffusely attenuated vessel. She underwent diagnostic cerebral angiogram which was negative for aneurysm, AVM or fistulas. She continued to have HA and developed obtundation on 10/29 with follow up CT head showing no change in diffuse SAH but progressive dilatation of lateral, third and 4th ventricles with developing hydrocephalus therefore ventric drain placed at bedside with improvement neurologically. She was started on nimotop and continued to have issues with mild HA. 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 Kathyrn Sheriff. 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 Follow up transcranial ultrasound without evidence of vasospasm. Therapy has been ongoing and patient continues to be limited by dizziness, cognitive deficits, staggering gait with fall risk  Patient transferred to CIR on 04/03/2021 .    Patient currently requires min with basic self-care skills secondary to muscle weakness, decreased awareness, decreased problem solving, decreased memory, and delayed processing, and decreased  standing balance, decreased postural control, and decreased balance strategies.  Prior to hospitalization, patient could complete ADLs with independent .  Patient will benefit from skilled intervention to decrease level of assist with basic self-care skills and increase level of  independence with iADL prior to discharge home with care partner.  Anticipate patient will require 24 hour supervision and no further OT follow recommended.  OT - End of Session Activity Tolerance: Tolerates 30+ min activity with multiple rests Endurance Deficit: Yes Endurance Deficit Description: some generalized weakness OT Assessment Rehab Potential (ACUTE ONLY): Excellent OT Patient demonstrates impairments in the following area(s): Balance;Safety;Cognition;Endurance;Motor;Vision OT Basic ADL's Functional Problem(s): Grooming;Dressing;Bathing;Toileting OT Advanced ADL's Functional Problem(s): Simple Meal Preparation OT Transfers Functional Problem(s): Toilet;Tub/Shower OT Additional Impairment(s): None OT Plan OT Intensity: Minimum of 1-2 x/day, 45 to 90 minutes OT Frequency: 5 out of 7 days OT Duration/Estimated Length of Stay: 5-7 days OT Treatment/Interventions: Balance/vestibular training;Discharge planning;Self Care/advanced ADL retraining;Therapeutic Activities;Cognitive remediation/compensation;Disease mangement/prevention;Functional mobility training;Patient/family education;Therapeutic Exercise;Visual/perceptual remediation/compensation;UE/LE Strength taining/ROM;Psychosocial support;DME/adaptive equipment instruction;Community reintegration OT Self Feeding Anticipated Outcome(s): no goal OT Basic Self-Care Anticipated Outcome(s): mod I OT Toileting Anticipated Outcome(s): mod I OT Bathroom Transfers Anticipated Outcome(s): supervision for shower- mod I toilet OT Recommendation Recommendations for Other Services: Neuropsych consult Patient destination: Home Follow Up Recommendations: 24 hour supervision/assistance Equipment Recommended: To be determined   OT Evaluation Precautions/Restrictions  Precautions Precautions: Fall Restrictions Weight Bearing Restrictions: No General Chart Reviewed: Yes Family/Caregiver Present: No Vital Signs Therapy Vitals BP: (!)  151/98 Pain Pain Assessment Pain Scale: 0-10 Pain Score: 0-No pain Home Living/Prior Functioning Home Living Available Help at Discharge: Family, Available 24 hours/day Type of Home: Apartment Home Access: Level entry Home Layout: One level Bathroom Shower/Tub: Multimedia programmer: Standard  Lives With: Son IADL History Homemaking Responsibilities: Yes Meal Prep Responsibility: Therapist, occupational Responsibility: Primary Cleaning Responsibility: Primary Bill Paying/Finance Responsibility: Primary Shopping Responsibility: Primary Current License: Yes Mode of Transportation: Car Occupation: Full time employment Prior Function Level of Independence: Independent with basic ADLs, Independent with gait, Independent with homemaking with wheelchair  Able to Take Stairs?: Yes Driving: No Vocation: Full time employment 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 Vision Baseline Vision/History: 1 Wears glasses (reading glasses) Ability to See in Adequate Light: 1 Impaired Patient Visual Report: Other (comment) Vision Assessment?: Yes Eye Alignment: Within Functional Limits Ocular Range of Motion: Within Functional Limits Alignment/Gaze Preference: Within Defined Limits Tracking/Visual Pursuits: Able to track stimulus in all quads without difficulty (some eye fatigue) Saccades: Within functional limits Convergence: Within functional limits Visual Fields: No apparent deficits Perception  Perception: Within Functional Limits Praxis Praxis: Intact Cognition Overall Cognitive Status: Impaired/Different from baseline Arousal/Alertness: Awake/alert Orientation Level: Situation;Place;Person Person: Oriented Place: Oriented Situation: Oriented Year: 2022 Month: November Day of Week: Correct Memory: Impaired Memory Impairment: Decreased short term memory;Decreased recall of new information Decreased Short Term Memory: Verbal basic;Functional basic Immediate Memory Recall:  Sock;Bed;Blue Memory Recall Sock: Not able to recall Memory Recall Blue: Not able to recall Memory Recall Bed: Without Cue Attention: Selective Selective Attention: Appears intact Awareness: Impaired Awareness Impairment: Emergent impairment Problem Solving: Impaired Problem Solving Impairment: Verbal complex;Functional complex Safety/Judgment: Impaired Sensation Sensation Light Touch: Appears Intact Hot/Cold: Appears Intact Proprioception: Appears Intact Stereognosis: Appears Intact Coordination Gross Motor Movements are Fluid and Coordinated: No Fine Motor Movements are Fluid and Coordinated: Yes Coordination and Movement Description: decreased speed, small balance perturbations, no LOB during mobility Finger Nose Finger Test: [facility] Motor  Motor Motor: Within Functional Limits  Trunk/Postural Assessment  Cervical Assessment Cervical Assessment: Within Functional Limits Thoracic Assessment Thoracic Assessment: Within Functional Limits Lumbar Assessment Lumbar Assessment: Within Functional Limits Postural Control Postural Control: Deficits on evaluation Righting Reactions: delayed  Balance Balance Balance Assessed: Yes Static Sitting Balance Static Sitting - Balance Support: Feet supported Static Sitting - Level of Assistance: 6: Modified independent (Device/Increase time) Dynamic Sitting Balance Dynamic Sitting - Balance Support: Feet supported Dynamic Sitting - Level of Assistance: 5: Stand by assistance Static Standing Balance Static Standing - Balance Support: During functional activity Static Standing - Level of Assistance: 5: Stand by assistance Dynamic Standing Balance Dynamic Standing - Balance Support: During functional activity Dynamic Standing - Level of Assistance: 4: Min assist Dynamic Standing - Balance Activities: Reaching for objects Extremity/Trunk Assessment RUE Assessment RUE Assessment: Within Functional Limits LUE Assessment LUE Assessment:  Within Functional Limits  Care Tool Care Tool Self Care Eating   Eating Assist Level: Independent    Oral Care    Oral Care Assist Level: Supervision/Verbal cueing    Bathing   Body parts bathed by patient: Left arm;Right lower leg;Right arm;Left lower leg;Face;Chest;Abdomen;Front perineal area;Buttocks;Right upper leg;Left upper leg     Assist Level: Contact Guard/Touching assist    Upper Body Dressing(including orthotics)   What is the patient wearing?: Pull over shirt   Assist Level: Supervision/Verbal cueing    Lower Body Dressing (excluding footwear)   What is the patient wearing?: Underwear/pull up;Pants Assist for lower body dressing: Contact Guard/Touching assist    Putting on/Taking off footwear   What is the patient wearing?: Non-skid slipper socks Assist for footwear: Set up assist       Care Tool Toileting Toileting activity   Assist for toileting: Contact Guard/Touching assist     Care Tool Bed Mobility Roll left and right activity   Roll left and right assist level: Independent    Sit to lying activity   Sit to lying assist level: Independent    Lying to sitting on side of bed activity   Lying to sitting on side of bed assist level: the ability to move from lying on the back to sitting on the side of the bed with no back support.: Independent     Care Tool Transfers Sit to stand transfer   Sit to stand assist level: Contact Guard/Touching assist    Chair/bed transfer   Chair/bed transfer assist level: Contact Guard/Touching assist     Toilet transfer   Assist Level: Contact Guard/Touching assist     Care Tool Cognition  Expression of Ideas and Wants Expression of Ideas and Wants: 4. Without difficulty (complex and basic) - expresses complex messages without difficulty and with speech that is clear and easy to understand  Understanding Verbal and Non-Verbal Content Understanding Verbal and Non-Verbal Content: 3. Usually understands -  understands most conversations, but misses some part/intent of message. Requires cues at times to understand   Memory/Recall Ability Memory/Recall Ability : Current season;Location of own room;Staff names and faces;That he or she is in a hospital/hospital unit   Refer to Care Plan for [place] OT Short Term Goal 1 (Week 1): STG=LTG d/t ELOS  Recommendations for other services: Neuropsych   Skilled Therapeutic Intervention Skilled OT evaluation completed as described above and below. Pt received supine with no c/o pain. Pt completed bed mobility to EOB with mod I. She sat EOB while BP was assessed- readings of 165/102 in the L arm and 151/98 in the R- reported  to RN who ok-ed session. Pt completed sit > stand with CGA and ambulatory transfer to the shower with CGA. She completed all bathing at [facility] level overall. Cueing required for safety when using TTB and walk in shower. Slow processing observed and STM deficits but overall cognition did not limit performance of ADLs. 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 RN entered as pt finished up shower to report need for EKG. Pt dressed- CGA overall and returned to supine. No c/o dizziness, pain, other symptoms throughout session. EKG tech entered room and pt was left with her while supine.    ADL ADL Eating: Modified independent Where Assessed-Eating: Chair Grooming: Supervision/safety Where Assessed-Grooming: Standing at sink Upper Body Bathing: Supervision/safety Where Assessed-Upper Body Bathing: Shower Lower Body Bathing: Contact guard Where Assessed-Lower Body Bathing: Shower Upper Body Dressing: Supervision/safety Where Assessed-Upper Body Dressing: Sitting at sink Lower Body Dressing: Contact guard Where Assessed-Lower Body Dressing: Sitting at sink Toileting: Contact guard Where Assessed-Toileting: Glass blower/designer: Medical laboratory scientific officer Method: Product/process development scientist Method: Ambulating Mobility  Bed Mobility Bed Mobility: Sit to Supine;Supine to Sit Supine to Sit: Independent Sit to Supine: Independent Transfers Sit to Stand: Contact Guard/Touching assist Stand to Sit: Contact Guard/Touching assist   Discharge Criteria: Patient will be discharged from OT if patient refuses treatment 3 consecutive times without medical reason, if treatment goals not met, if there is a change in medical status, if patient makes no progress towards goals or if patient is discharged from hospital.  The above assessment, treatment plan, treatment alternatives and goals were discussed and mutually agreed upon: by patient  Curtis Sites 04/03/2021, 10:45 AM

## 2021-04-03 NOTE — H&P (Signed)
Physical Medicine and Rehabilitation Admission H&P        Chief Complaint  Patient presents with   Functional deficits due SAH/aneurysm       HPI:  Amanda Henry is a 55 year old female with history of HTN, iron deficiency anemia, hypothyroid who was admitted on 03/15/21 with reports of worst HA of her life follwoed yb blurry vision and photophobia. CT head done revealing diffuse SAH and CTA head showed diffusely attenuated vessel. She underwent diagnostic cerebral angiogram which was negative for aneurysm, AVM or fistulas. She continued to have HA and developed obtundation on 10/29 with follow up CT head showing no change in diffuse SAH but progressive dilatation of lateral, third and 4th ventricles with developing hydrocephalus therefore ventric drain placed at bedside with improvement neurologically. She was started on nimotop and continued to have issues with mild HA. 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 Kathyrn Sheriff. 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 Follow up transcranial ultrasound without evidence of vasospasm. Therapy has been ongoing and patient continues to be limited by dizziness, cognitive deficits, staggering gait with fall risk    Pt reports always feels constipated due to her thyroid disease.  Peeing "too much" but chronic; LBM today- medium-  Feels "up and down"- c/o dizziness when up- Having HA on top of head- frontal- feels "open"- thinks due to BP being high.    Review of Systems  Constitutional:  Negative for chills and fever.  HENT:  Negative for hearing loss and tinnitus.   Eyes:  Positive for blurred vision (vision comes and goes).  Respiratory:  Negative for shortness of breath.   Cardiovascular:  Positive for palpitations (pain/anxiety). Negative for chest pain.   Gastrointestinal:  Positive for constipation. Negative for heartburn.  Genitourinary:  Positive for frequency. Negative for dysuria.  Musculoskeletal:  Positive for joint pain and myalgias (with cold weather).  Skin:  Negative for rash.  Neurological:  Positive for dizziness (has been going on for months), sensory change (at times--in left leg), weakness and headaches.  Psychiatric/Behavioral:  The patient has insomnia.   All other systems reviewed and are negative.         Past Medical History:  Diagnosis Date   Anxiety     Blood transfusion without reported diagnosis     Depression     Hypertension      no medications at this time   Hypothyroidism     Iron deficiency anemia     Thyroid disease             Past Surgical History:  Procedure Laterality Date   IR ANGIO INTRA EXTRACRAN SEL COM CAROTID INNOMINATE UNI L MOD SED   03/16/2021   IR ANGIO INTRA EXTRACRAN SEL COM CAROTID INNOMINATE UNI L MOD SED   03/26/2021   IR ANGIO INTRA EXTRACRAN SEL INTERNAL CAROTID UNI R MOD SED   03/15/2021   IR ANGIO INTRA EXTRACRAN SEL INTERNAL CAROTID UNI R MOD SED   03/26/2021   IR ANGIO INTRA EXTRACRAN SEL INTERNAL CAROTID UNI R MOD SED   03/25/2021   IR ANGIO VERTEBRAL SEL VERTEBRAL UNI L MOD SED   03/15/2021   IR ANGIO VERTEBRAL SEL VERTEBRAL UNI L MOD SED   03/26/2021   IR ANGIOGRAM FOLLOW UP STUDY   03/26/2021   IR  ANGIOGRAM FOLLOW UP STUDY   03/26/2021   IR TRANSCATH/EMBOLIZ   03/26/2021   IR US GUIDE VASC ACCESS RIGHT   03/26/2021   LAPAROSCOPIC VAGINAL HYSTERECTOMY WITH SALPINGECTOMY Bilateral 10/01/2016    Procedure: LAPAROSCOPIC ASSISTED VAGINAL HYSTERECTOMY WITH SALPINGECTOMY;  Surgeon: Paula Compton, MD;  Location: [facility];  Service: Gynecology;  Laterality: Bilateral;   RADIOLOGY WITH ANESTHESIA N/A 03/15/2021    Procedure: IR WITH ANESTHESIA;  Surgeon: Consuella Lose, MD;  Location: [facility];  Service: Radiology;  Laterality: N/A;   RADIOLOGY WITH ANESTHESIA N/A 03/26/2021     Procedure: IR WITH ANESTHESIA (Coiling and Embolization);  Surgeon: Consuella Lose, MD;  Location: [facility];  Service: Radiology;  Laterality: N/A;   WISDOM TOOTH EXTRACTION               Family History  Problem Relation Age of Onset   Thyroid disease Mother     Hypertension Mother     Diabetes Father     Hypertension Other     Diabetes Other     Thyroid disease Other     Heart disease Other        Social History:  lives alone and  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 She  reports that she has never smoked. She has never used smokeless tobacco. She reports that she does not drink alcohol and does not use drugs.          Allergies  Allergen Reactions   Latex Rash   Penicillins Rash      Has patient had a PCN reaction causing immediate rash, facial/tongue/throat swelling, SOB or lightheadedness with hypotension: Yes Has patient had a PCN reaction causing severe rash involving mucus membranes or skin necrosis: No Has patient had a PCN reaction that required hospitalization No Has patient had a PCN reaction occurring within the last 10 years: No If all of the above answers are "NO", then may proceed with Cephalosporin use.              Medications Prior to Admission  Medication Sig Dispense Refill   acetaminophen (TYLENOL) 500 MG tablet Take 1,000 mg by mouth every 6 (six) hours as needed for moderate pain or headache.       ALPRAZolam (XANAX XR) 1 MG 24 hr tablet Take 1 mg by mouth at bedtime.       escitalopram (LEXAPRO) 10 MG tablet Take 1 tablet (10 mg total) by mouth daily. (Patient taking differently: Take 10 mg by mouth at bedtime.) 30 tablet 1   ipratropium (ATROVENT) 0.06 % nasal spray USE 2 SPRAYS IN EACH NOSTRIL THREE TIMES DAILY (Patient taking differently: Place 2 sprays into both nostrils 2 (two) times daily as needed for rhinitis.) 15 mL 2   levothyroxine (SYNTHROID) 150 MCG tablet TAKE 1 TABLET (150 MCG) BY MOUTH DAILY before breakfast  (Patient taking differently: Take 150 mcg by mouth daily before breakfast.) 45 tablet 1   Vitamin D, Ergocalciferol, (DRISDOL) 1.25 MG (50000 UNIT) CAPS capsule TAKE 1 CAPSULE BY MOUTH 2 TIMES A WEEK (Patient taking differently: Take 50,000 Units by mouth See admin instructions. Monday and wednesday) 24 capsule 1   diclofenac Sodium (VOLTAREN) 1 % GEL Apply 2 g topically 4 (four) times daily. (Patient not taking: Reported on 03/15/2021) 100 g 2   ibuprofen (ADVIL,MOTRIN) 600 MG tablet Take 1 tablet (600 mg total) by mouth every 6 (six) hours as needed (mild pain). (Patient not taking: Reported on 03/15/2021) 30 tablet  0      Drug Regimen Review  Drug regimen was reviewed and remains appropriate with no significant issues identified   Home: Home Living Family/patient expects to be discharged to:: Private residence Living Arrangements: Alone Available Help at Discharge: Family, Available 24 hours/day Type of Home: Apartment Home Access: Level entry Home Layout: One level Bathroom Shower/Tub: Multimedia programmer: Standard Home Equipment: None  Lives With: Alone   Functional History: Prior Function Prior Level of Function : Independent/Modified Independent, Driving, Working/employed ADLs 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 Status:  Mobility: Bed Mobility Overal bed mobility: Needs Assistance Bed Mobility: Supine to Sit, Sit to Supine Sidelying to sit: Min assist Supine to sit: Min guard Sit to supine: Min guard Sit to sidelying: Supervision General bed mobility comments: use of bed rail, greatly increased time to initiate, assist with blankets needed to perform Transfers Overall transfer level: Needs assistance Equipment used: Rolling walker (2 wheels), None Transfers: Sit to/from Stand Sit to Stand: Min guard Stand pivot transfers: Min assist General transfer comment: pt performed 5 x STS in 73 seconds, needs greatly increased time to  initiate/perform transfers and fair carryover of safety cues within session this afternoon Ambulation/Gait Ambulation/Gait assistance: Min assist, +2 safety/equipment Gait Distance (Feet): 60 Feet Assistive device: Rolling walker (2 wheels), None Gait Pattern/deviations: Step-through pattern, Staggering left, Staggering right General Gait Details: 66ft with RW and min guard to minA, seated break, then 67ft with no AD and unilateral HHA with consistent minA needed and increased lateral drift, slow pace. Pt reports dizziness toward end of second gait trial and found to be orthostatic. Gait velocity: decreased Gait velocity interpretation: <1.31 ft/sec, indicative of household ambulator   ADL: ADL Overall ADL's : Needs assistance/impaired Eating/Feeding: Independent Grooming: Oral care, Standing, Min guard Grooming Details (indicate cue type and reason): She was able to ambulate from bed to sink without AD with min A Upper Body Bathing: Minimal assistance, Sitting Lower Body Bathing: Moderate assistance, Sit to/from stand Upper Body Dressing : Moderate assistance, Sitting Lower Body Dressing: Moderate assistance, Sit to/from stand Lower Body Dressing Details (indicate cue type and reason): Can get to feet by crossing legs over one another aleternately at knee Toilet Transfer: Minimal assistance, Ambulation, Grab bars, Comfort height toilet Toilet Transfer Details (indicate cue type and reason): Had on loss of balance as she came out of the bathroom to turn to sink but she was able to recover with a cross over step Toileting- Clothing Manipulation and Hygiene: Minimal assistance Functional mobility during ADLs: Minimal assistance, Rolling walker (2 wheels) General ADL Comments: drifted to R with ambulation, decreased expressive language abilities in session   Cognition: Cognition Overall Cognitive Status: Impaired/Different from baseline Arousal/Alertness: Awake/alert Orientation Level:  Oriented X4 Attention: Sustained, Alternating Sustained Attention: Impaired Sustained Attention Impairment: Verbal complex, Functional complex Alternating Attention: Impaired Alternating Attention Impairment: Verbal complex, Functional complex Memory: Impaired Memory Impairment: Decreased short term memory, Decreased recall of new information Awareness: Appears intact Problem Solving: Impaired Problem Solving Impairment: Verbal complex, Functional complex Executive Function: Organizing, Self Monitoring Organizing: Impaired Self Monitoring: Impaired Cognition Arousal/Alertness: Awake/alert Behavior During Therapy: WFL for tasks assessed/performed Overall Cognitive Status: Impaired/Different from baseline Area of Impairment: Attention, Safety/judgement, Awareness, Problem solving Orientation Level:  (not specifically tested) Current Attention Level: Sustained Memory: Decreased short-term memory Following Commands: Follows one step commands inconsistently, Follows one step commands with increased time Safety/Judgement: Decreased awareness of safety, Decreased awareness of deficits Awareness: Emergent Problem  Solving: Requires verbal cues, Requires tactile cues, Slow processing General Comments: Pt requiring increased time to initiate and perform all mobility tasks and is aware she is not at baseline. Pt dizzy but does not let staff know immediately.     Blood pressure (!) 179/97, pulse 87, temperature 99 F (37.2 C), temperature source Oral, resp. rate 16, height 5\' 8"  (1.727 m), weight 83.9 kg, last menstrual period 08/28/2016, SpO2 99 %. Physical Exam Vitals and nursing note reviewed.  Constitutional:      Appearance: Normal appearance.     Comments: Pt laying supine in bed; appears younger than state age; wearing braids; c/o dizziness/HA; vague, NAD  HENT:     Head: Normocephalic and atraumatic.     Comments: Smile equal; tongue midline; staples x2 in R frontal area    Right  Ear: External ear normal.     Left Ear: External ear normal.     Nose: Nose normal. No congestion.     Mouth/Throat:     Mouth: Mucous membranes are moist.     Pharynx: Oropharynx is clear. No oropharyngeal exudate.  Eyes:     General:        Right eye: No discharge.        Left eye: No discharge.     Extraocular Movements: Extraocular movements intact.  Cardiovascular:     Rate and Rhythm: Normal rate and regular rhythm.     Heart sounds: Normal heart sounds. No murmur heard.   No gallop.  Pulmonary:     Effort: Pulmonary effort is normal. No respiratory distress.     Breath sounds: Normal breath sounds. No wheezing, rhonchi or rales.  Abdominal:     Comments: Slightly distended; hypoactive; NT; soft  Musculoskeletal:     Cervical back: Normal range of motion. No rigidity.     Comments: UE 4/5 B/L- robotic movements- slightly slowed 4+/5 B/L in LE's, except PF 5-/5 B/L   Skin:    Comments: R forearm IV- looks OK; no skin breakdown on heels; staples in R head/frontal; skin looks good  Neurological:     Mental Status: She is alert and oriented to person, place, and time.     Comments: Vague- tangential; but Ox3 technically Intact to light touch in all 4 extremities and face No increased tone/Hoffman's/clonus B/L   Psychiatric:     Comments: Flat affect; slowed/delayed responses      Lab Results Last 48 Hours        Results for orders placed or performed during the hospital encounter of 03/15/21 (from the past 48 hour(s))  Basic metabolic panel     Status: Abnormal    Collection Time: 04/01/21  3:56 AM  Result Value Ref Range    Sodium 137 135 - 145 mmol/L    Potassium 3.4 (L) 3.5 - 5.1 mmol/L    Chloride 103 98 - 111 mmol/L    CO2 29 22 - 32 mmol/L    Glucose, Bld 135 (H) 70 - 99 mg/dL      Comment: Glucose reference range applies only to samples taken after fasting for at least 8 hours.    BUN 11 6 - 20 mg/dL    Creatinine, Ser 0.88 0.44 - 1.00 mg/dL    Calcium >15.0  (HH) 8.9 - 10.3 mg/dL      Comment: CRITICAL RESULT CALLED TO, READ BACK BY AND VERIFIED WITH: Stacie Acres RN [id number] 04/01/21 M KOROLESKI      GFR, Estimated >60 >60 mL/min  Comment: (NOTE) Calculated using the CKD-EPI Creatinine Equation (2021)      Anion gap 5 5 - 15      Comment: Performed at [facility], [place] [address] [place], [place] [address]  Magnesium     Status: None    Collection Time: 04/01/21  3:56 AM  Result Value Ref Range    Magnesium 2.2 1.7 - 2.4 mg/dL      Comment: Performed at [facility] [address] [place], [place] [address]  Phosphorus     Status: None    Collection Time: 04/01/21  3:56 AM  Result Value Ref Range    Phosphorus 3.0 2.5 - 4.6 mg/dL      Comment: Performed at [facility] [address], [place], [place] [address]      Imaging Results (Last 48 hours)  No results found.           Medical Problem List and Plan: 1.  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  Antithrombotics: -DVT/anticoagulation:  Mechanical: Sequential compression devices, below knee Bilateral lower extremities due to [facility]             -antiplatelet therapy: N/A 3. Pain Management: hydrocodone prn mild HA and MSIR for severe HA- if HA doesn't improve, might need daily preventative 4. Mood: LCSW  to follow for evaluation and support.              -antipsychotic agents: N/A 5. Neuropsych: This patient is capable of making decisions on her own behalf. 6. Skin/Wound Care: Routine pressure relief 7. Fluids/Electrolytes/Nutrition: Monitor I/O. Encourage fluid intake.  8. HTN: Monitor BP TID--continue nimodipine and metoprolol             --monitor for orthostatic changes/dizziness 9. Hypokalemia: Will increase K dur to TID 10. Hypothyroid/hypercalcemia: On supplement --managed by Endocrine. 11. Diffuse myalgias: Will resume voltaren gel to  knee/LLE.  12. Dizziness- will monitor if needs vestibular evaluation by therapy.      I have personally performed a face to face diagnostic evaluation of this patient and formulated the key components of the plan.  Additionally, I have personally reviewed laboratory data, imaging studies, as well as relevant notes and concur with the physician assistant's documentation above.   The patient's status has not changed from the original H&P.  Any changes in documentation from the acute care chart have been noted above.       Bary Leriche, PA-C 04/02/2021

## 2021-04-03 NOTE — Progress Notes (Signed)
Inpatient Rehabilitation Admission Medication Review by a Pharmacist  A complete drug regimen review was completed for this patient to identify any potential clinically significant medication issues.  High Risk Drug Classes Is patient taking? Indication by Medication  Antipsychotic Yes Compazine for nausea  Anticoagulant No SCDs only due to [facility]  Antibiotic No   Opioid Yes MSIR for diffuse myalgias  Antiplatelet No   Hypoglycemics/insulin No   Vasoactive Medication Yes Metoprolol, Nimotop for HTN s/p SAH  Chemotherapy No   Other No      Type of Medication Issue Identified Description of Issue Recommendation(s)  Drug Interaction(s) (clinically significant)     Duplicate Therapy     Allergy     No Medication Administration End Date     Incorrect Dose     Additional Drug Therapy Needed  Influenza vaccine not given during inpatient stay Re-order and give in rehab  Significant med changes from prior encounter (inform family/care partners about these prior to discharge).    Other       Clinically significant medication issues were identified that warrant physician communication and completion of prescribed/recommended actions by midnight of the next day:  No  Time spent performing this drug regimen review (minutes):  10 min  Aki Abalos S. Alford Highland, PharmD, BCPS Clinical Staff Pharmacist [email or web address] [place] 04/03/2021 8:57 AM

## 2021-04-03 NOTE — Discharge Summary (Signed)
Physician Discharge Summary  Patient ID: [name] MRN: [id number] DOB/AGE: Jul 14, 1965 55 y.o.  Admit date: 04/03/2021 Discharge date: 04/03/2021  Discharge Diagnoses:  Principal Problem:   Hypercalcemia Active Problems:   Depression with anxiety   Iron deficiency anemia   SAH (subarachnoid hemorrhage) (HCC)   Hypokalemia   Discharged Condition: stable  Significant Diagnostic Studies: N/A   Labs:  Basic Metabolic Panel: Recent Labs  Lab 03/27/21 1349 04/01/21 0356 04/03/21 1056  NA 137 137 137  K 2.8* 3.4* 3.5  CL 102 103 104  CO2 26 29 27   GLUCOSE 167* 135* 156*  BUN 11 11 9   CREATININE 0.87 0.88 0.95  CALCIUM 13.8* >15.0* >15.0*  MG 2.0 2.2  --   PHOS  --  3.0  --     CBC: CBC Latest Ref Rng & Units 04/03/2021 03/26/2021 03/15/2021  WBC 4.0 - 10.5 K/uL 6.2 9.9 -  Hemoglobin 12.0 - 15.0 g/dL 12.8 13.9 13.6  Hematocrit 36.0 - 46.0 % 38.8 42.8 40.0  Platelets 150 - 400 K/uL 379 471(H) -     CBG: No results for input(s): GLUCAP in the last 168 hours.  Brief HPI:   Amanda Henry is a 55 y.o. female with history of HTN, iron deficiency anemia, hypothyroid who was admitted on 03/15/21    Hospital Course: [place] [facility] was admitted to rehab 04/03/2021 for inpatient therapies to consist of PT, ST and OT at least three hours five days a week. Past admission physiatrist, therapy team and rehab RN have worked together to provide customized collaborative inpatient rehab.    Diet: Regular.   Disposition: Acute hospital.       Signed: Bary Leriche 04/03/2021, 12:39 PM

## 2021-04-03 NOTE — H&P (View-Only) (Signed)
Reason for Consult:  Hypercalcemia - possible parathyroid adenoma Referring Physician: Dr. Fuller Plan Endocrinologist - Dr. Philemon Kingdom  Amanda Henry is an 55 y.o. female.  HPI: This is a 55 year old female who was recently referred to Dr. Armandina Gemma in our practice for surgical evaluation of suspected primary hyperparathyroidism.  The patient has had hypercalcemia for over one year.  She has had a markedly elevated calcium level and an elevated intact PTH level of 163. 24-hour urine collection for calcium was elevated at 390. At the time of the office visit on 03/07/21, she had not had any imaging studies performed. Patient has noted significant fatigue. She has had depression. She notes bone and joint pain. She describes "brain fog". 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  There is no family history of parathyroid disease. Patient has had no prior head or neck surgery.  At that time, plans were made to proceed with imaging studies including ultrasound of the neck and nuclear medicine parathyroid scan.    Unfortunately, she presented to the emergency department on 03/15/21 with acute subarachnoid hemorrhage.  Angiogram was negative for intracranial aneurysm.  External ventricular drain was placed.  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  The drain was removed and the patient was discharged to rehab on 04/01/21.    During her hospitalization, she did undergo thyroid ultrasound on 03/20/21 which revealed a 2 cm right inferior thyroid nodule vs. Parathyroid adenoma.  Nuclear medicine parathyroid scan was recommended but has not yet been performed.    The patient was transferred to inpatient rehab on 04/01/21.  Her calcium was noted to be >15.0 today, but the patient reports no symptoms.  EKG had question of ST changes but cardiologist felt that there was no sign of STEMI.  [facility] has readmitted the  patient to the hospital for management of her hypercalcemia.  Surgery was consulted regarding possible parathyroid adenoma.  Past Medical History:  Diagnosis Date   Anxiety    Blood transfusion without reported diagnosis    Depression    Hypertension    no medications at this time   Hypothyroidism    Iron deficiency anemia    Thyroid disease     Past Surgical History:  Procedure Laterality Date   IR ANGIO INTRA EXTRACRAN SEL COM CAROTID INNOMINATE UNI L MOD SED  03/16/2021   IR ANGIO INTRA EXTRACRAN SEL COM CAROTID INNOMINATE UNI L MOD SED  03/26/2021   IR ANGIO INTRA EXTRACRAN SEL INTERNAL CAROTID UNI R MOD SED  03/15/2021   IR ANGIO INTRA EXTRACRAN SEL INTERNAL CAROTID UNI R MOD SED  03/26/2021   IR ANGIO INTRA EXTRACRAN SEL INTERNAL CAROTID UNI R MOD SED  03/25/2021   IR ANGIO VERTEBRAL SEL VERTEBRAL UNI L MOD SED  03/15/2021   IR ANGIO VERTEBRAL SEL VERTEBRAL UNI L MOD SED  03/26/2021   IR ANGIOGRAM FOLLOW UP STUDY  03/26/2021   IR ANGIOGRAM FOLLOW UP STUDY  03/26/2021   IR TRANSCATH/EMBOLIZ  03/26/2021   IR US GUIDE VASC ACCESS RIGHT  03/26/2021   LAPAROSCOPIC VAGINAL HYSTERECTOMY WITH SALPINGECTOMY Bilateral 10/01/2016   Procedure: LAPAROSCOPIC ASSISTED VAGINAL HYSTERECTOMY WITH SALPINGECTOMY;  Surgeon: Paula Compton, MD;  Location: [facility];  Service: Gynecology;  Laterality: Bilateral;   RADIOLOGY WITH ANESTHESIA N/A 03/15/2021   Procedure: IR WITH ANESTHESIA;  Surgeon: Consuella Lose, MD;  Location: [facility];  Service: Radiology;  Laterality: N/A;   RADIOLOGY WITH  ANESTHESIA N/A 03/26/2021   Procedure: IR WITH ANESTHESIA (Coiling and Embolization);  Surgeon: Consuella Lose, MD;  Location: [facility];  Service: Radiology;  Laterality: N/A;   WISDOM TOOTH EXTRACTION      Family History  Problem Relation Age of Onset   Thyroid disease Mother    Hypertension Mother    Diabetes Father    Hypertension Other    Diabetes Other    Thyroid disease Other    Heart disease Other      Social History:  reports that she has never smoked. She has never used smokeless tobacco. She reports that she does not drink alcohol and does not use drugs.  Allergies:  Allergies  Allergen Reactions   Latex Rash   Penicillins Rash    Has patient had a PCN reaction causing immediate rash, facial/tongue/throat swelling, SOB or lightheadedness with hypotension: Yes Has patient had a PCN reaction causing severe rash involving mucus membranes or skin necrosis: No Has patient had a PCN reaction that required hospitalization No Has patient had a PCN reaction occurring within the last 10 years: No If all of the above answers are "NO", then may proceed with Cephalosporin use.     Medications: Scheduled:  calcitonin  4 Units/kg Subcutaneous BID   diclofenac Sodium  2 g Topical QID   docusate sodium  100 mg Oral BID   escitalopram  10 mg Oral QHS   [START ON 04/04/2021] levothyroxine  150 mcg Oral QAC breakfast   [START ON 04/04/2021] metoprolol tartrate  25 mg Oral Daily   niMODipine  60 mg Oral Q4H   [START ON 04/04/2021] pantoprazole  40 mg Oral Daily   potassium chloride  20 mEq Oral STAT   Continuous:  sodium chloride     sodium chloride      Results for orders placed or performed during the hospital encounter of 04/03/21 (from the past 48 hour(s))  Comprehensive metabolic panel     Status: Abnormal   Collection Time: 04/03/21 10:56 AM  Result Value Ref Range   Sodium 137 135 - 145 mmol/L   Potassium 3.5 3.5 - 5.1 mmol/L   Chloride 104 98 - 111 mmol/L   CO2 27 22 - 32 mmol/L   Glucose, Bld 156 (H) 70 - 99 mg/dL    Comment: Glucose reference range applies only to samples taken after fasting for at least 8 hours.   BUN 9 6 - 20 mg/dL   Creatinine, Ser 0.95 0.44 - 1.00 mg/dL   Calcium >15.0 (HH) 8.9 - 10.3 mg/dL    Comment: CRITICAL RESULT CALLED TO, READ BACK BY AND VERIFIED WITH: P.LOVE,RN 04/03/2021 1150 DAVISB    Total Protein 7.5 6.5 - 8.1 g/dL   Albumin 3.5 3.5 -  5.0 g/dL   AST 14 (L) 15 - 41 U/L   ALT 14 0 - 44 U/L   Alkaline Phosphatase 118 38 - 126 U/L   Total Bilirubin 0.5 0.3 - 1.2 mg/dL   GFR, Estimated >60 >60 mL/min    Comment: (NOTE) Calculated using the CKD-EPI Creatinine Equation (2021)    Anion gap 6 5 - 15    Comment: Performed at [facility] [address] [place], [place] [address]  CBC WITH DIFFERENTIAL     Status: None   Collection Time: 04/03/21 10:56 AM  Result Value Ref Range   WBC 6.2 4.0 - 10.5 K/uL   RBC 4.36 3.87 - 5.11 MIL/uL   Hemoglobin 12.8 12.0 - 15.0 g/dL  HCT 38.8 36.0 - 46.0 %   MCV 89.0 80.0 - 100.0 fL   MCH 29.4 26.0 - 34.0 pg   MCHC 33.0 30.0 - 36.0 g/dL   RDW 13.4 11.5 - 15.5 %   Platelets 379 150 - 400 K/uL   nRBC 0.0 0.0 - 0.2 %   Neutrophils Relative % 49 %   Neutro Abs 3.0 1.7 - 7.7 K/uL   Lymphocytes Relative 41 %   Lymphs Abs 2.6 0.7 - 4.0 K/uL   Monocytes Relative 6 %   Monocytes Absolute 0.4 0.1 - 1.0 K/uL   Eosinophils Relative 3 %   Eosinophils Absolute 0.2 0.0 - 0.5 K/uL   Basophils Relative 1 %   Basophils Absolute 0.1 0.0 - 0.1 K/uL   Immature Granulocytes 0 %   Abs Immature Granulocytes 0.01 0.00 - 0.07 K/uL    Comment: Performed at [facility] [address] [place], [place] [address]  Phosphorus     Status: Abnormal   Collection Time: 04/03/21 12:08 PM  Result Value Ref Range   Phosphorus 1.7 (L) 2.5 - 4.6 mg/dL    Comment: Performed at [facility] [address], [place], [place] [address]  Magnesium     Status: None   Collection Time: 04/03/21 12:08 PM  Result Value Ref Range   Magnesium 2.0 1.7 - 2.4 mg/dL    Comment: Performed at [facility], [place] [address] [place],  [address]   CLINICAL DATA:  Primary hyperparathyroidism   EXAM: THYROID ULTRASOUND   TECHNIQUE: Ultrasound examination of the thyroid gland and adjacent soft tissues was performed.   COMPARISON:  None.   FINDINGS: Parenchymal Echotexture: Moderately  heterogenous   Isthmus: 3 mm   Right lobe: 2.7 x 2.0 x 1.7 cm   Left lobe: 3.3 x 0.8 x 0.7 cm   _________________________________________________________   Estimated total number of nodules >/= 1 cm: 1   Number of spongiform nodules >/=  2 cm not described below (TR1): 0   Number of mixed cystic and solid nodules >/= 1.5 cm not described below ([place]): 0   _________________________________________________________   Nodule # 1:   Location: Right; Inferior   Maximum size: 2.0 cm; Other 2 dimensions: 1.7 x 1.8 cm   Composition: solid/almost completely solid (2)   Echogenicity: hypoechoic (2)   Shape: not taller-than-wide (0)   Margins: ill-defined (0)   Echogenic foci: none (0)   ACR TI-RADS total points: 4.   ACR TI-RADS risk category: TR4 (4-6 points).   ACR TI-RADS recommendations:   **Given size (>/= 1.5 cm) and appearance, fine needle aspiration of this moderately suspicious nodule should be considered based on TI-RADS criteria.   _________________________________________________________   Overall, the thyroid gland appears atrophic. No hypervascularity. No regional adenopathy.   IMPRESSION: 2 cm right inferior thyroid TR 4 nodule versus parathyroid nodule/adenoma (given the history of primary hyperparathyroidism). Correlate with nuclear medicine parathyroid scan.   The above is in keeping with the ACR TI-RADS recommendations - J Am Coll Radiol 2017;14:587-595.     Electronically Signed   By: Jerilynn Mages.  Shick M.D.   On: 03/20/2021 07:42    Review of Systems Constitutional:  Negative for chills and fever.  HENT:  Negative for hearing loss and tinnitus.   Eyes:  Positive for blurred vision (vision comes and goes).  Respiratory:  Negative for shortness of breath.   Cardiovascular:  Positive for palpitations (pain/anxiety). Negative for chest pain.  Gastrointestinal:  Negative for constipation and heartburn.  Genitourinary:  Positive for frequency. Negative  for dysuria.  Musculoskeletal:  Positive for joint pain and myalgias (with cold weather).  Skin:  Negative for rash.  Neurological:  Positive for dizziness (has been going on for months), sensory change (at times--in left leg), weakness and headaches.  Psychiatric/Behavioral:  The patient has insomnia.    Temperature 98.6 F (37 C), resp. rate 18, weight 77.7 kg, last menstrual period 08/28/2016. Physical Exam Constitutional:  WDWN in NAD, conversant, no obvious deformities; lying in bed comfortably Eyes:  Pupils equal, round; sclera anicteric; moist conjunctiva; no lid lag HENT:  Oral mucosa moist; good dentition  Neck:  No masses palpated, trachea midline; no thyromegaly Lungs:  CTA bilaterally; normal respiratory effort CV:  Regular rate and rhythm; no murmurs; extremities well-perfused with no edema Abd:  +bowel sounds, soft, non-tender, no palpable organomegaly; no palpable hernias Musc:  Unable to assess gait; no apparent clubbing or cyanosis in extremities Lymphatic:  No palpable cervical or axillary lymphadenopathy Skin:  Warm, dry; no sign of jaundice Psychiatric - alert and oriented x 4; calm mood and affect  Assessment/Plan: Hypercalcemia with chemical evidence of primary hyperparathyroidism  Ultrasound shows right thyroid nodule vs parathyroid adenoma  Recs:  Medical management of hypercalcemia, although she does not appear to be very symptomatic.  Nuclear medicine parathyroid scan - has been ordered; hopefully will be performed tomorrow. If this localizes to the right inferior parathyroid and shows adenoma, she will likely need parathyroidectomy, possibly on Friday.  Imogene Burn Johnny Latu 04/03/2021, 4:34 PM

## 2021-04-03 NOTE — IPOC Note (Signed)
Overall Plan of Care [facility]) Patient Details Name: Lisette Mancebo MRN: [id number] DOB: 1965/10/25  Admitting Diagnosis: Hypercalcemia  Hospital Problems: Principal Problem:   Hypercalcemia Active Problems:   Depression with anxiety   Iron deficiency anemia   SAH (subarachnoid hemorrhage) (HCC)   Hypokalemia     Functional Problem List: Nursing Safety, Bowel, Endurance, Medication Management, Pain  PT    OT Balance, Safety, Cognition, Endurance, Motor, Vision  SLP    TR         Basic ADL's: OT Grooming, Dressing, Bathing, Toileting     Advanced  ADL's: OT Simple Meal Preparation     Transfers: PT    OT Toilet, Tub/Shower     Locomotion: PT       Additional Impairments: OT None  SLP        TR      Anticipated Outcomes Item Anticipated Outcome  Self Feeding no goal  Swallowing      Basic self-care  mod I  Toileting  mod I   Bathroom Transfers supervision for shower- mod I toilet  Bowel/Bladder  manage bowel w mod I assist  Transfers     Locomotion     Communication     Cognition     Pain  at or below level 4 with prns  Safety/Judgment  maintain safety w cues   Therapy Plan:   OT Intensity: Minimum of 1-2 x/day, 45 to 90 minutes OT Frequency: 5 out of 7 days OT Duration/Estimated Length of Stay: 5-7 days     Due to the current state of emergency, patients may not be receiving their 3-hours of Medicare-mandated therapy.   Team Interventions: Nursing Interventions Bowel Management, Medication Management, Discharge Planning, Pain Management, Patient/Family Education, Disease Management/Prevention, Psychosocial Support  PT interventions    OT Interventions Balance/vestibular training, Discharge planning, Self Care/advanced ADL retraining, Therapeutic Activities, Cognitive remediation/compensation, Disease mangement/prevention, Functional mobility training, Patient/family education, Therapeutic Exercise, Visual/perceptual  remediation/compensation, UE/LE Strength taining/ROM, Psychosocial support, DME/adaptive equipment instruction, Community reintegration  SLP Interventions    TR Interventions    SW/CM Interventions Discharge Planning, Psychosocial Support, Patient/Family Education   Barriers to Discharge MD  Medical stability  Nursing Decreased caregiver support 1 level 2ste left rail w parents  PT      OT      SLP      SW Insurance for SNF coverage     Team Discharge Planning: Destination: PT-  ,OT- Home , SLP-  Projected Follow-up: PT- , OT-  24 hour supervision/assistance, SLP-  Projected Equipment Needs: PT- , OT- To be determined, SLP-  Equipment Details: PT- , OT-  Patient/family involved in discharge planning: PT-  ,  OT-Patient, SLP-   MD ELOS: 1 day Medical Rehab Prognosis:  Guarded Assessment: Mrs. Hassebrock is a 55 year old woman admitted to CIR with SAH. She was transferred back to acute on day 1 of admission due to severe hypercalcemia with plan for nuclear medicine parathyroid scan with potential parathyroidectomy depending on results.    See Team Conference Notes for weekly updates to the plan of care

## 2021-04-03 NOTE — Progress Notes (Signed)
Am labs discussed with Dr. Thalia Party that Ca-15 which is higher than most recent checks. Will start patient on IVF and check PTH/ca levels as well as baseline EKG. Reached out to Dr. Letta Median for input--waiting call back. Also reached out to [facility] to assist with management.

## 2021-04-03 NOTE — Progress Notes (Addendum)
Inpatient Rehabilitation Care Coordinator Discharge Note   Patient Details  Name: Amanda Henry MRN: [id number] Date of Birth: 06-27-1965   Discharge location: [place]  Length of Stay: 1 DAY  Discharge activity level:    Home/community participation:    Patient response [id number] Literacy - How often do you need to have someone help you when you read instructions, pamphlets, or other written material from your doctor or pharmacy?: Never  Patient response [id number] Isolation - How often do you feel lonely or isolated from those around you?: Never  Services provided included: MD, RD, PT, OT, SLP, RN, CM, Pharmacy, SW  Financial Services:  Charity fundraiser Utilized: [place] offered to/list presented to:    Follow-up services arranged: [facility] OF HER TRANSFER TO ACUTE             Patient response to transportation need: Is the patient able to respond to transportation needs?: Yes In the past 12 months, has lack of transportation kept you from medical appointments or from getting medications?: No In the past 12 months, has lack of transportation kept you from meetings, work, or from getting things needed for daily living?: No    Comments (or additional information): GOING BACK TO ACUTE TO BE MONITORED WILL RETURN ONCE MEDICALLY STABLE  Patient/Family verbalized understanding of follow-up arrangements:     Individual responsible for coordination of the follow-up plan: SELF  Confirmed correct DME delivered: Elease Hashimoto 04/03/2021    Elease Hashimoto

## 2021-04-03 NOTE — Progress Notes (Signed)
Physical Therapy Session Note  Patient Details  Name: Taydem Cavagnaro MRN: [id number] Date of Birth: 15-Jan-1966  Today's Date: 04/03/2021 PT Missed Time: 24 Minutes Missed Time Reason: MD hold (Comment) (hold per PA for elevated CA levels)  Evaluation postponed per PA orders d/t high CA levels. Pt missed 60 minutes of scheduled time as she was medically unable to participate.   Therapy/Group: Individual Therapy  Mickel Fuchs 04/03/2021, 2:29 PM

## 2021-04-03 NOTE — H&P (Signed)
History and Physical    Amanda Henry MLY:[id number] DOB: [birth year] DOA: (Not on file)  Referring MD/NP/PA: Reesa Chew, PA-C PCP: [name], FNP  Patient coming from: Rehab  Chief Complaint: Hypercalcemia  I have personally briefly reviewed patient's old medical records in [place]   HPI: Amanda Henry is a 55 y.o. female with medical history significant of hypertension, iron deficiency anemia, hypothyroidism, and hyperparathyroidism who had just recently been hospitalized on neurosurgery service from 10/28-11/14 after presenting with severe headache found to have subarachnoid hemorrhage.  Angiogram has been negative for intracranial aneurysm, but required external ventricular drain placement due to worsening somnolence.  Following drain placement patient had improvement in mentation.  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 Kathyrn Sheriff.  Following the procedure patient had continued to be limited by dizziness, issues with her memory, and staggering gait for which she was a high falls risk.  She had been accepted to rehab.  When initially consulted to admit from rehab labs from 04/01/2021 noted potassium 3.4, and calcium greater than 15.  Repeat labs today confirm calcium greater than 15.  Patient has been started on normal saline IV fluids at 100 mL/h.  Patient reports that she has been being worked up in the outpatient setting for elevated calcium level.  Calcium levels had initially been noted to be elevated in 2018 and hyperparathyroidism diagnosed in 05/2019 per review records.  She had been being followed by Dr. Cruzita Lederer of endocrinology more over a year she had been referred to Dr. Harlow Asa for possible parathyroidectomy to manage of suspected primary hyperparathyroidism.  Review of Systems  Constitutional:  Negative for fever and malaise/fatigue.   Eyes:  Negative for photophobia and pain.  Cardiovascular:  Negative for chest pain and leg swelling.  Gastrointestinal:  Positive for constipation. Negative for nausea and vomiting.  Neurological:  Positive for dizziness.  Psychiatric/Behavioral:  Positive for memory loss.     Past Medical History:  Diagnosis Date   Anxiety    Blood transfusion without reported diagnosis    Depression    Hypertension    no medications at this time   Hypothyroidism    Iron deficiency anemia    Thyroid disease     Past Surgical History:  Procedure Laterality Date   IR ANGIO INTRA EXTRACRAN SEL COM CAROTID INNOMINATE UNI L MOD SED  03/16/2021   IR ANGIO INTRA EXTRACRAN SEL COM CAROTID INNOMINATE UNI L MOD SED  03/26/2021   IR ANGIO INTRA EXTRACRAN SEL INTERNAL CAROTID UNI R MOD SED  03/15/2021   IR ANGIO INTRA EXTRACRAN SEL INTERNAL CAROTID UNI R MOD SED  03/26/2021   IR ANGIO INTRA EXTRACRAN SEL INTERNAL CAROTID UNI R MOD SED  03/25/2021   IR ANGIO VERTEBRAL SEL VERTEBRAL UNI L MOD SED  03/15/2021   IR ANGIO VERTEBRAL SEL VERTEBRAL UNI L MOD SED  03/26/2021   IR ANGIOGRAM FOLLOW UP STUDY  03/26/2021   IR ANGIOGRAM FOLLOW UP STUDY  03/26/2021   IR TRANSCATH/EMBOLIZ  03/26/2021   IR US GUIDE VASC ACCESS RIGHT  03/26/2021   LAPAROSCOPIC VAGINAL HYSTERECTOMY WITH SALPINGECTOMY Bilateral 10/01/2016   Procedure: LAPAROSCOPIC ASSISTED VAGINAL HYSTERECTOMY WITH SALPINGECTOMY;  Surgeon: Paula Compton, MD;  Location: [facility];  Service: Gynecology;  Laterality: Bilateral;   RADIOLOGY WITH ANESTHESIA N/A 03/15/2021   Procedure: IR WITH ANESTHESIA;  Surgeon: Consuella Lose, MD;  Location: [facility];  Service: Radiology;  Laterality: N/A;   RADIOLOGY WITH ANESTHESIA N/A 03/26/2021   Procedure: IR WITH ANESTHESIA (Coiling and Embolization);  Surgeon: Consuella Lose, MD;  Location: [facility];  Service: Radiology;  Laterality: N/A;   WISDOM TOOTH EXTRACTION       reports that she has never smoked. She has never  used smokeless tobacco. She reports that she does not drink alcohol and does not use drugs.  Allergies  Allergen Reactions   Latex Rash   Penicillins Rash    Has patient had a PCN reaction causing immediate rash, facial/tongue/throat swelling, SOB or lightheadedness with hypotension: Yes Has patient had a PCN reaction causing severe rash involving mucus membranes or skin necrosis: No Has patient had a PCN reaction that required hospitalization No Has patient had a PCN reaction occurring within the last 10 years: No If all of the above answers are "NO", then may proceed with Cephalosporin use.     Family History  Problem Relation Age of Onset   Thyroid disease Mother    Hypertension Mother    Diabetes Father    Hypertension Other    Diabetes Other    Thyroid disease Other    Heart disease Other     Prior to Admission medications   Medication Sig Start Date End Date Taking? Authorizing Provider  acetaminophen (TYLENOL) 500 MG tablet Take 1,000 mg by mouth every 6 (six) hours as needed for moderate pain or headache.    [provider]  ALPRAZolam (XANAX XR) 1 MG 24 hr tablet Take 1 mg by mouth at bedtime.    [provider]  escitalopram (LEXAPRO) 10 MG tablet Take 1 tablet (10 mg total) by mouth daily. Patient taking differently: Take 10 mg by mouth at bedtime. 09/06/18   [name], FNP  ibuprofen (ADVIL,MOTRIN) 600 MG tablet Take 1 tablet (600 mg total) by mouth every 6 (six) hours as needed (mild pain). Patient not taking: Reported on 03/15/2021 10/02/16   Paula Compton, MD  ipratropium (ATROVENT) 0.06 % nasal spray USE 2 SPRAYS IN [facility] NOSTRIL THREE TIMES DAILY Patient taking differently: Place 2 sprays into both nostrils 2 (two) times daily as needed for rhinitis. 07/23/20   [name], FNP  levothyroxine (SYNTHROID) 150 MCG tablet TAKE 1 TABLET (150 MCG) BY MOUTH DAILY before breakfast Patient taking differently: Take 150 mcg by mouth daily before  breakfast. 12/31/20   [name], FNP  metoprolol tartrate (LOPRESSOR) 25 MG tablet Take 1 tablet (25 mg total) by mouth daily. 04/02/21   Consuella Lose, MD  niMODipine (NIMOTOP) 30 MG capsule Take 2 capsules (60 mg total) by mouth every 4 (four) hours for 4 days. 04/01/21 04/05/21  Consuella Lose, MD  Vitamin D, Ergocalciferol, (DRISDOL) 1.25 MG (50000 UNIT) CAPS capsule TAKE 1 CAPSULE BY MOUTH 2 TIMES A WEEK Patient taking differently: Take 50,000 Units by mouth See admin instructions. Monday and wednesday 12/31/20   Philemon Kingdom, MD    Physical Exam:  Constitutional: NAD, calm, comfortable Vitals:   04/03/21 1606 04/03/21 1616  Resp:  18  Temp:  98.6 F (37 C)  Weight: 77.7 kg    Eyes: PERRL, lids and conjunctivae normal ENMT: Mucous membranes are dry. Posterior pharynx clear of any exudate or lesions. Neck: normal, supple, no masses, no thyromegaly Respiratory: clear to auscultation bilaterally, no wheezing, no crackles. Normal respiratory effort. No accessory muscle use.  Cardiovascular: Regular rate and rhythm, no murmurs / rubs / gallops. No extremity edema. 2+ pedal pulses. No  carotid bruits.  Abdomen: no tenderness, no masses palpated. No hepatosplenomegaly. Bowel sounds positive.  Musculoskeletal: no clubbing / cyanosis. No joint deformity upper and lower extremities.  Skin: Staples present to scalp Neurologic: CN 2-12 grossly intact. Sensation intact, DTR normal. Strength 5/5 in all 4.  Psychiatric: Poor memory. and oriented x 3.      Labs on Admission: I have personally reviewed following labs and imaging studies  CBC: Recent Labs  Lab 04/03/21 1056  WBC 6.2  NEUTROABS 3.0  HGB 12.8  HCT 38.8  MCV 89.0  PLT 833    Basic Metabolic Panel: Recent Labs  Lab 04/01/21 0356 04/03/21 1056 04/03/21 1208  NA 137 137  --   K 3.4* 3.5  --   CL 103 104  --   CO2 29 27  --   GLUCOSE 135* 156*  --   BUN 11 9  --   CREATININE 0.88 0.95  --   CALCIUM  >15.0* >15.0*  --   MG 2.2  --  2.0  PHOS 3.0  --  1.7*    GFR: Estimated Creatinine Clearance: 74.7 mL/min (by C-G formula based on SCr of 0.95 mg/dL). Liver Function Tests: Recent Labs  Lab 04/03/21 1056  AST 14*  ALT 14  ALKPHOS 118  BILITOT 0.5  PROT 7.5  ALBUMIN 3.5    No results for input(s): LIPASE, AMYLASE in the last 168 hours. No results for input(s): AMMONIA in the last 168 hours. Coagulation Profile: No results for input(s): INR, PROTIME in the last 168 hours. Cardiac Enzymes: No results for input(s): CKTOTAL, CKMB, CKMBINDEX, TROPONINI in the last 168 hours. BNP (last 3 results) No results for input(s): PROBNP in the last 8760 hours. HbA1C: No results for input(s): HGBA1C in the last 72 hours. CBG: No results for input(s): GLUCAP in the last 168 hours. Lipid Profile: No results for input(s): CHOL, HDL, LDLCALC, TRIG, CHOLHDL, LDLDIRECT in the last 72 hours. Thyroid Function Tests: No results for input(s): TSH, T4TOTAL, FREET4, T3FREE, THYROIDAB in the last 72 hours. Anemia Panel: No results for input(s): VITAMINB12, FOLATE, FERRITIN, TIBC, IRON, RETICCTPCT in the last 72 hours. Urine analysis:    Component Value Date/Time   COLORURINE RED (A) 08/28/2016 0550   APPEARANCEUR HAZY (A) 08/28/2016 0550   LABSPEC 1.006 08/28/2016 0550   PHURINE 5.0 08/28/2016 0550   GLUCOSEU NEGATIVE 08/28/2016 0550   HGBUR LARGE (A) 08/28/2016 0550   BILIRUBINUR negative 05/31/2019 1649   KETONESUR NEGATIVE 08/28/2016 0550   PROTEINUR Negative 05/31/2019 1649   PROTEINUR NEGATIVE 08/28/2016 0550   UROBILINOGEN 0.2 05/31/2019 1649   NITRITE negative 05/31/2019 1649   NITRITE NEGATIVE 08/28/2016 0550   LEUKOCYTESUR Negative 05/31/2019 1649   Sepsis Labs: No results found for this or any previous visit (from the past 240 hour(s)).   Radiological Exams on Admission: No results found.  EKG: Independently reviewed.  68 bpm with normal sinus  rhythm  Assessment/Plan Hypercalcemia 2/2 hyperparathyroidism: Acute on chronic.  Patient presents with calcium levels greater than 15.  Records note calcium levels have been chronically elevated for which she had been being followed by Dr. Cruzita Lederer of [facility] endocrinology and was referred to Dr. Harlow Asa for need of parathyroidectomy.  She had been placed on high dose vitamin D supplementation to see if they could help reduce calcium levels, but may be a factor contributing to worsening of elevated calcium levels. -Admit to a progressive bed -Check 25-hydroxy vitamin D and discontinue dvitamin D supplementation -Bolus 1  L normal saline IV fluids then placed on a rate at 200 mL/h as tolerated -Calcitonin 4units/kg IV twice daily -Serial monitoring of calcium levels -Follow-up NM parathyroid imaging ordered by general surgery -[place] surgery consulted, we will follow-up for any further recommendation  Subarachnoid hemorrhage secondary to aneurysm: Patient had just recently been hospitalized on neurosurgery service after being found to have a diffuse subarachnoid hemorrhage after. -PT/OT to evaluate and treat -Continue rehab orders  Hypokalemia: Resolved. Potassium noted to be 3.5 repeat check today. -Give additional potassium 20 mEq x 1 dose.  Acquired Hypothyroidism -Check TSH -Continue levothyroxine  Myalgias: Patient has been started on Voltaren gel for myalgias with 3, but question of secondary to hypercalcemia. -Continue Voltaren gel for now   DVT prophylaxis: SCDs Code Status: Full Family Communication: None Disposition Plan: Likely discharge back to rehab once medically Consults called: [place] surgery Admission status: Inpatient  Norval Morton MD [facility]   If 7PM-7AM, please contact night-coverage   04/03/2021, 3:13 PM

## 2021-04-04 ENCOUNTER — Inpatient Hospital Stay (HOSPITAL_COMMUNITY): Payer: BC Managed Care – PPO

## 2021-04-04 LAB — BASIC METABOLIC PANEL
Anion gap: 7 (ref 5–15)
BUN: 8 mg/dL (ref 6–20)
CO2: 23 mmol/L (ref 22–32)
Calcium: 13.9 mg/dL (ref 8.9–10.3)
Chloride: 110 mmol/L (ref 98–111)
Creatinine, Ser: 0.8 mg/dL (ref 0.44–1.00)
GFR, Estimated: >60 mL/min (ref >60)
Glucose, Bld: 182 mg/dL — ABNORMAL HIGH (ref 70–99)
Potassium: 3.9 mmol/L (ref 3.5–5.1)
Sodium: 140 mmol/L (ref 135–145)

## 2021-04-04 LAB — PTH, INTACT AND CALCIUM
Calcium, Total (PTH): 15.8 mg/dL (ref 8.7–10.2)
PTH: 311 pg/mL — ABNORMAL HIGH (ref 15–65)

## 2021-04-04 LAB — TSH: TSH: 0.403 u[IU]/mL (ref 0.350–4.500)

## 2021-04-04 MED ORDER — TECHNETIUM TC 99M SESTAMIBI GENERIC - CARDIOLITE
25.3000 | Freq: Once | INTRAVENOUS | Status: AC | PRN
  Administered 2021-04-04: 10:00:00 25.3 via INTRAVENOUS

## 2021-04-04 NOTE — Plan of Care (Signed)
  Problem: Safety: Goal: Ability to remain free from injury will improve Outcome: Progressing   

## 2021-04-04 NOTE — Progress Notes (Signed)
PT Cancellation Note  Patient Details Name: Lilie Vezina MRN: [id number] DOB: 1965/12/23   Cancelled Treatment:    Reason Eval/Treat Not Completed: Patient at procedure or test/unavailable. Will plan to follow-up later as time permits.   Moishe Spice, PT, DPT Acute Rehabilitation Services  Pager: [phone] Office: [place] 04/04/2021, 11:54 AM

## 2021-04-04 NOTE — Evaluation (Signed)
Occupational Therapy Evaluation Patient Details Name: Amanda Henry MRN: [id number] DOB: [birth year] Today's Date: 04/04/2021   History of Present Illness Amanda Henry is a 55 y.o. female who was admitted from inpatient rehab due to calcium levels >15. 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 Pt presents now with deficits in cognition, coordination, and dynamic standing balance. Pt able to mobilize on a min guard basis without AD with intermittent swaying noted. Pt requires Min A for UB/LB ADL completion but requires Mod-Max cues to initiate, attend and sequence multi-step ADL tasks impacting safe and efficient completion of daily tasks. Pt unable to problem solve or demo awareness of safety concerns with IADL completion (such as risk of fire hazard with stove-top cooking, med mgmt with pt's current memory impairments). As pt is at increased risk for falls, significantly below baseline and noted with safety concerns in managing typical daily tasks, recommend inpatient rehab for comprehensive therapies. Will continue to follow acutely to further assess cognition with formal assessment and incorporate compensatory/safety strategies with ADLs/IADLs.      Recommendations for follow up therapy are one component of a multi-disciplinary discharge planning process, led by the attending physician.  Recommendations may be updated based on patient status, additional functional criteria and insurance authorization.   Follow Up Recommendations  Acute inpatient rehab (3hours/day)    Assistance Recommended at Discharge Frequent or  constant Supervision/Assistance  Functional Status Assessment  Patient has had a recent decline in their functional status and demonstrates the ability to make significant improvements in function in a reasonable and predictable amount of time.  Equipment Recommendations  None recommended by OT    Recommendations for Other Services Rehab consult     Precautions / Restrictions Precautions Precautions: Fall Precaution Comments: watch BP -OH Restrictions Weight Bearing Restrictions: No      Mobility Bed Mobility Overal bed mobility: Needs Assistance Bed Mobility: Supine to Sit     Supine to sit: Supervision;HOB elevated          Transfers Overall transfer level: Needs assistance Equipment used: None Transfers: Sit to/from Stand Sit to Stand: Min guard           General transfer comment: min guard to ensure safety, increased time to rise      Balance Overall balance assessment: Needs assistance Sitting-balance support: No upper extremity supported;Feet supported Sitting balance-Leahy Scale: Good     Standing balance support: No upper extremity supported;During functional activity Standing balance-Leahy Scale: Fair Standing balance comment: able to mobilize without AD though limitations noted with intermittent sways                           ADL either performed or assessed with clinical judgement   ADL Overall ADL's : Needs assistance/impaired     Grooming: Oral care;Standing;Min guard;Wash/dry face;Wash/dry Nurse, mental health Details (indicate cue type and reason): Mod verbal cues needed to initiate, attend and sequence task Upper Body Bathing: Supervision/ safety;Sitting   Lower Body Bathing: Minimal assistance;Sit to/from stand   Upper Body Dressing : Supervision/safety;Sitting   Lower Body Dressing: Minimal assistance;Sit to/from stand   Toilet Transfer: Min guard;Ambulation   Toileting- Clothing Manipulation and Hygiene: Minimal  assistance;Sit to/from  stand       Functional mobility during ADLs: Min guard General ADL Comments: Pt greatly limited by cognition deficits. began education on safety strategies for tasks such as cooking at home (encouraged use of timers, follow recipe and mark out items as they are completed, etc). Further formal cognitive testing and strategy formulation needed for daily tasks     Vision Baseline Vision/History: 1 Wears glasses Ability to See in Adequate Light: 1 Impaired Vision Assessment?: No apparent visual deficits (to be further assessed)     Perception     Praxis      Pertinent Vitals/Pain Pain Assessment: No/denies pain     Hand Dominance Right   Extremity/Trunk Assessment Upper Extremity Assessment Upper Extremity Assessment: Overall WFL for tasks assessed (mild L UE coordination deficits)   Lower Extremity Assessment Lower Extremity Assessment: Defer to PT evaluation   Cervical / Trunk Assessment Cervical / Trunk Assessment: Normal   Communication Communication Communication: No difficulties   Cognition Arousal/Alertness: Awake/alert Behavior During Therapy: WFL for tasks assessed/performed Overall Cognitive Status: Impaired/Different from baseline Area of Impairment: Attention;Safety/judgement;Awareness;Problem solving;Memory;Following commands                   Current Attention Level: Sustained Memory: Decreased short-term memory Following Commands: Follows one step commands inconsistently;Follows one step commands with increased time Safety/Judgement: Decreased awareness of safety;Decreased awareness of deficits Awareness: Emergent Problem Solving: Requires verbal cues;Requires tactile cues;Slow processing;Difficulty sequencing General Comments: Pt able to converse appropriately initially about memory and attention deficits. Once attempting completion of multi step ADL tasks, pt with poor attention to task (began doing washing hands, etc prior to  brushing teeth as planned). Pt with frequent tangential conversation and difficulty multi tasking successfully. When asked about safety concerns for cooking due to memory/attention deficits, pt unable to demo insight into this appropriately with conversation leading into meal prep, salmon vs chicken preparation.     General Comments       Exercises     Shoulder Instructions      Home Living Family/patient expects to be discharged to:: Private residence Living Arrangements: Alone Available Help at Discharge: Family;Available 24 hours/day Type of Home: Apartment Home Access: Level entry     Home Layout: One level     Bathroom Shower/Tub: Occupational psychologist: Standard     Home Equipment: None   Additional Comments: reports she can stay with son or parents at [place] after rehab      Prior Functioning/Environment Prior Level of Function : Independent/Modified Independent;Driving;Working/employed             Mobility Comments: no use of AD for mobility ADLs Comments: pt works as a second Land. Independent with all ADLs, IADLs (cooking, laundry, etc) - enjoys being outside        OT Problem List: Decreased strength;Decreased activity tolerance;Impaired balance (sitting and/or standing);Impaired vision/perception;Decreased cognition;Decreased safety awareness;Decreased coordination      OT Treatment/Interventions: Self-care/ADL training;DME and/or AE instruction;Cognitive remediation/compensation;Therapeutic activities;Visual/perceptual remediation/compensation;Patient/family education;Balance training;Therapeutic exercise    OT Goals(Current goals can be found in the care plan section) Acute Rehab OT Goals Patient Stated Goal: more positivity, recover, return home and return to work OT Goal Formulation: With patient Time For Goal Achievement: 04/18/21 Potential to Achieve Goals: Good  OT Frequency: Min 2X/week   Barriers to D/C:             Co-evaluation              AM-PAC  OT "6 Clicks" Daily Activity     Outcome Measure Help from another person eating meals?: None Help from another person taking care of personal grooming?: A Little Help from another person toileting, which includes using toliet, bedpan, or urinal?: A Little Help from another person bathing (including washing, rinsing, drying)?: A Little Help from another person to put on and taking off regular upper body clothing?: A Little Help from another person to put on and taking off regular lower body clothing?: A Little 6 Click Score: 19   End of Session Equipment Utilized During Treatment: Gait belt Nurse Communication: Mobility status;Other (comment) (cognition)  Activity Tolerance: Patient tolerated treatment well Patient left:  (to ambulate with PT)  OT Visit Diagnosis: Unsteadiness on feet (R26.81);Other symptoms and signs involving cognitive function Symptoms and signs involving cognitive functions: Nontraumatic SAH                Time: 1251-1313 OT Time Calculation (min): 22 min Charges:  OT General Charges $OT Visit: 1 Visit OT Evaluation $OT Eval Moderate Complexity: 1 Mod  Malachy Chamber, OTR/L Acute Rehab Services Office: [phone]   Layla Maw 04/04/2021, 1:40 PM

## 2021-04-04 NOTE — Progress Notes (Signed)
Inpatient Rehabilitation Discharge Medication Review by a Pharmacist   A complete drug regimen review was completed for this patient to identify any potential clinically significant medication issues.   High Risk Drug Classes Is patient taking? Indication by Medication  Antipsychotic Yes Compazine for nausea  Anticoagulant No SCDs only due to [facility]  Antibiotic No    Opioid Yes MSIR for diffuse myalgias  Antiplatelet No    Hypoglycemics/insulin No    Vasoactive Medication Yes Metoprolol, Nimotop for HTN s/p SAH  Chemotherapy No    Other No          Type of Medication Issue Identified Description of Issue Recommendation(s)  Drug Interaction(s) (clinically significant)        Duplicate Therapy        Allergy        No Medication Administration End Date        Incorrect Dose        Additional Drug Therapy Needed   Influenza vaccine not given during inpatient stay Re-order and give prior to discharge  Significant med changes from prior encounter (inform family/care partners about these prior to discharge).      Other   Transfer back to inpatient        Clinically significant medication issues were identified that warrant physician communication and completion of prescribed/recommended actions by midnight of the next day:  No   Time spent performing this drug regimen review (minutes):  10 min   Mackenzi Krogh S. Alford Highland, PharmD, BCPS Clinical Staff Pharmacist [email or web address] [place] 04/04/2021 8:09 AM

## 2021-04-04 NOTE — Progress Notes (Signed)
Inpatient Rehabilitation  Patient information reviewed and entered into eRehab system by Eliabeth Shoff M. Aran Menning, M.A., CCC/SLP, PPS Coordinator.  Information including medical coding, functional ability and quality indicators will be reviewed and updated through discharge.    

## 2021-04-04 NOTE — Progress Notes (Deleted)
Inpatient Rehabilitation Discharge Medication Review by a Pharmacist  A complete drug regimen review was completed for this patient to identify any potential clinically significant medication issues.  High Risk Drug Classes Is patient taking? Indication by Medication  Antipsychotic Yes Compazine for nausea  Anticoagulant No SCDs only due to [facility]  Antibiotic No   Opioid Yes MSIR for diffuse myalgias  Antiplatelet No   Hypoglycemics/insulin No   Vasoactive Medication Yes Metoprolol, Nimotop for HTN s/p SAH  Chemotherapy No   Other No      Type of Medication Issue Identified Description of Issue Recommendation(s)  Drug Interaction(s) (clinically significant)     Duplicate Therapy     Allergy     No Medication Administration End Date     Incorrect Dose     Additional Drug Therapy Needed  Influenza vaccine not given during inpatient stay Re-order and give prior to discharge  Significant med changes from prior encounter (inform family/care partners about these prior to discharge).    Other  Transfer back to inpatient     Clinically significant medication issues were identified that warrant physician communication and completion of prescribed/recommended actions by midnight of the next day:  No  Time spent performing this drug regimen review (minutes):  10 min  Xylia Scherger S. Alford Highland, PharmD, BCPS Clinical Staff Pharmacist [email or web address] [place] 04/04/2021 8:09 AM

## 2021-04-04 NOTE — Evaluation (Signed)
Physical Therapy Evaluation Patient Details Name: Amanda Henry MRN: [id number] DOB: [birth year] Today's Date: 04/04/2021  History of Present Illness  Amanda Henry is a 55 y.o. female who was admitted 04/03/21 from inpatient rehab due to calcium levels >15. 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 PMH: hypertension, iron deficiency anemia, hypothyroidism, and hyperparathyroidism   Clinical Impression  Pt presents with condition above and deficits mentioned below, see PT Problem List. Prior to initial injury/admission on 10/28, pt completely Independent and was working as a second Land. Pt presents now with deficits in cognition, L-sided coordination, L leg weakness, activity tolerance,and dynamic standing balance. While pt is able to ambulate without UE support on a level surface at a min guard assist level physically, she displays unsteadiness with dynamic gait challenges and requires min-mod cuing to direct pt, maintain her attention to task, and maintain her safety. Pt requiring minA physically with mod cues to navigate stairs. Pt initially attempting to ascend with 1 handrail support with reciprocal gait pattern, but displayed LOB due to her L toes catching on a step, likely due to her strength and coordination deficits on her L. Pt had to slow down and navigate stairs with bil UE support at a step-to pattern at the end of stair negotiation. Pt is at high risk for falls, supported by her DGI score of 13 this date. Due to her significant deficits in cognition impacting her functional mobility safety, deficits in balance placing her at high risk for falls, significant decline in functional status, and high motivation to return to being independent, pt would greatly benefit from further intensive therapy in the inpt rehab setting to  maximize her return to baseline. Will continue to follow acutely.     Recommendations for follow up therapy are one component of a multi-disciplinary discharge planning process, led by the attending physician.  Recommendations may be updated based on patient status, additional functional criteria and insurance authorization.  Follow Up Recommendations Acute inpatient rehab (3hours/day)    Assistance Recommended at Discharge Frequent or constant Supervision/Assistance  Functional Status Assessment Patient has had a recent decline in their functional status and demonstrates the ability to make significant improvements in function in a reasonable and predictable amount of time.  Equipment Recommendations  None recommended by PT    Recommendations for Other Services Rehab consult     Precautions / Restrictions Precautions Precautions: Fall Precaution Comments: watch BP -OH Restrictions Weight Bearing Restrictions: No      Mobility  Bed Mobility Overal bed mobility: Needs Assistance Bed Mobility: Supine to Sit     Supine to sit: Supervision;HOB elevated     General bed mobility comments: Pt standing at sink with OT upon arrival.    Transfers Overall transfer level: Needs assistance Equipment used: None Transfers: Sit to/from Stand Sit to Stand: Min guard           General transfer comment: Pt standing at sink with OT upon arrival.    Ambulation/Gait Ambulation/Gait assistance: Min guard;Mod assist Gait Distance (Feet): 160 Feet Assistive device: None Gait Pattern/deviations: Step-through pattern;Decreased stride length;Drifts right/left;Narrow base of support Gait velocity: decreased Gait velocity interpretation: <1.31 ft/sec, indicative of household ambulator   General Gait Details: Pt with no UE support, taking short slow steps with unsteadiness noted. Min guard for balance, but increased unsteadiness noted with dynamic gait challenges. Pt requiring min-mod cues  to direct pt or  keep pt on task.  Stairs Stairs: Yes Stairs assistance: Min assist;Mod assist Stair Management: One rail Left;One rail Right;Two rails;Alternating pattern;Step to pattern;Forwards Number of Stairs: 4 General stair comments: Pt initially attempting to ascend with reciprocal pattern using L handrail only, but pt tripped on L toe requiring minA to maintain balance and mod cues to clear foot with step-to pattern on final couple steps ascending. Pt with slow, step-to pattern descending with using 1 rail initially then bil rails.  Wheelchair Mobility    Modified Rankin (Stroke Patients Only) Modified Rankin (Stroke Patients Only) Pre-Morbid Rankin Score: No symptoms Modified Rankin: Moderately severe disability     Balance Overall balance assessment: Needs assistance Sitting-balance support: No upper extremity supported;Feet supported Sitting balance-Leahy Scale: Good     Standing balance support: No upper extremity supported;During functional activity Standing balance-Leahy Scale: Fair Standing balance comment: able to mobilize without AD though limitations noted with intermittent sways                 Standardized Balance Assessment Standardized Balance Assessment : Dynamic Gait Index   Dynamic Gait Index Level Surface: Mild Impairment Change in Gait Speed: Mild Impairment Gait with Horizontal Head Turns: Mild Impairment Gait with Vertical Head Turns: Mild Impairment Gait and Pivot Turn: Moderate Impairment Step Over Obstacle: Mild Impairment Step Around Obstacles: Moderate Impairment Steps: Moderate Impairment Total Score: 13       Pertinent Vitals/Pain Pain Assessment: No/denies pain    Home Living Family/patient expects to be discharged to:: Private residence Living Arrangements: Alone Available Help at Discharge: Family;Available 24 hours/day Type of Home: Apartment Home Access: Level entry       Home Layout: One level Home Equipment:  None Additional Comments: reports she can stay with son or parents at [place] after rehab. Reports her parents have stairs to enter their home.    Prior Function Prior Level of Function : Independent/Modified Independent;Driving;Working/employed             Mobility Comments: no use of AD for mobility ADLs Comments: pt works as a second Land. Independent with all ADLs, IADLs (cooking, laundry, etc) - enjoys being outside     Hand Dominance   Dominant Hand: Right    Extremity/Trunk Assessment   Upper Extremity Assessment Upper Extremity Assessment: Defer to OT evaluation    Lower Extremity Assessment Lower Extremity Assessment: LLE deficits/detail LLE Deficits / Details: Grossly 4+ compared to 5 on R; tingling sensation in L foot, intact elsewhere and throughout R; coordination deficits noted with foot tapping LLE Sensation: decreased light touch LLE Coordination: decreased gross motor;decreased fine motor    Cervical / Trunk Assessment Cervical / Trunk Assessment: Normal  Communication   Communication: No difficulties  Cognition Arousal/Alertness: Awake/alert Behavior During Therapy: WFL for tasks assessed/performed Overall Cognitive Status: Impaired/Different from baseline Area of Impairment: Attention;Safety/judgement;Awareness;Problem solving;Memory;Following commands                   Current Attention Level: Sustained Memory: Decreased short-term memory Following Commands: Follows one step commands inconsistently;Follows one step commands with increased time Safety/Judgement: Decreased awareness of safety;Decreased awareness of deficits Awareness: Emergent Problem Solving: Requires verbal cues;Requires tactile cues;Slow processing;Difficulty sequencing General Comments: Pt with frequent tangential conversation and difficulty multi tasking successfully. Able to follow 2-step commands with increased processing time. Pt with STM deficits, forgetting about  cues to pick up object off ground at one point. Recalls room #, but relied on visual clues and VCs to path find her  way back to her room, not her room sign number. Needed cues to even find her room sign number on wall outside door.        General Comments      Exercises     Assessment/Plan    PT Assessment Patient needs continued PT services  PT Problem List Decreased strength;Decreased activity tolerance;Decreased balance;Decreased mobility;Decreased coordination;Decreased cognition;Decreased safety awareness;Impaired sensation       PT Treatment Interventions DME instruction;Gait training;Stair training;Functional mobility training;Therapeutic activities;Therapeutic exercise;Balance training;Neuromuscular re-education;Cognitive remediation;Patient/family education    PT Goals (Current goals can be found in the Care Plan section)  Acute Rehab PT Goals Patient Stated Goal: to get back to being independent PT Goal Formulation: With patient Time For Goal Achievement: 04/18/21 Potential to Achieve Goals: Good    Frequency Min 4X/week   Barriers to discharge        Co-evaluation               AM-PAC PT "6 Clicks" Mobility  Outcome Measure Help needed turning from your back to your side while in a flat bed without using bedrails?: A Little Help needed moving from lying on your back to sitting on the side of a flat bed without using bedrails?: A Little Help needed moving to and from a bed to a chair (including a wheelchair)?: A Little Help needed standing up from a chair using your arms (e.g., wheelchair or bedside chair)?: A Little Help needed to walk in hospital room?: A Lot (mod cues) Help needed climbing 3-5 steps with a railing? : A Lot (mod cues) 6 Click Score: 16    End of Session Equipment Utilized During Treatment: Gait belt Activity Tolerance: Patient tolerated treatment well Patient left: in chair;with call bell/phone within reach Nurse Communication:  Mobility status PT Visit Diagnosis: Unsteadiness on feet (R26.81);Muscle weakness (generalized) (M62.81);Other abnormalities of gait and mobility (R26.89);Other symptoms and signs involving the nervous system (R29.898);Difficulty in walking, not elsewhere classified (R26.2)    Time: 5409-8119 PT Time Calculation (min) (ACUTE ONLY): 17 min   Charges:   PT Evaluation $PT Eval Moderate Complexity: 1 Mod          Moishe Spice, PT, DPT Acute Rehabilitation Services  Pager: [phone] Office: [phone]   Orvan Falconer 04/04/2021, 2:20 PM

## 2021-04-04 NOTE — Progress Notes (Signed)
Progress Note    Amanda Henry  MLY:[id number] DOB: September 05, 1965  DOA: 04/03/2021 PCP: [name], FNP    Brief Narrative:     Medical records reviewed and are as summarized below:  Amanda Henry is an 55 y.o. female with medical history significant of hypertension, iron deficiency anemia, hypothyroidism, and hyperparathyroidism who had just recently been hospitalized on neurosurgery service from 10/28-11/14 after presenting with severe headache found to have subarachnoid hemorrhage.   Assessment/Plan:   Principal Problem:   Hypercalcemia Active Problems:   Acquired hypothyroidism   Hypokalemia   History of subarachnoid hemorrhage   Hyperparathyroidism (HCC)   Hypercalcemia 2/2 hyperparathyroidism:  -Acute on chronic.  Patient presents with calcium levels greater than 15.  Records note calcium levels have been chronically elevated for which she had been being followed by Dr. Cruzita Lederer of [facility] endocrinology and was referred to Dr. Harlow Asa for need of parathyroidectomy.   -Bolus 1 L normal saline IV fluids then placed on a rate at 200 mL/h as tolerated -Calcitonin 4units/kg IV twice daily -Serial monitoring of calcium levels -Follow-up NM parathyroid imaging ordered by general surgery -GS consult appreciated    Subarachnoid hemorrhage secondary to aneurysm: Patient had just recently been hospitalized on neurosurgery service after being found to have a diffuse subarachnoid hemorrhage after. -PT/OT to evaluate and treat   Hypokalemia:  -replete   Hypothyroidism -Continue levothyroxine -TSH normal   Myalgias: -question of secondary to hypercalcemia. -Continue Voltaren gel for now    Family Communication/Anticipated D/C date and plan/Code Status   DVT prophylaxis: scd Code Status: Full Code.   Disposition Plan: Status is: Inpatient  Remains inpatient appropriate because: needs further work up for calcium         Medical Consultants:    GS   Subjective:   Up walking around room, no complaints   Objective:    Vitals:   04/04/21 0417 04/04/21 0700 04/04/21 0844 04/04/21 1133  BP: (!) 181/83 (!) 154/92 (!) 141/75 (!) 148/77  Pulse: 88 91 77 61  Resp: 18   18  Temp: 98.7 F (37.1 C)  98.2 F (36.8 C) 98 F (36.7 C)  TempSrc: Oral  Oral Oral  SpO2: 97%  100% 100%  Weight: 77.4 kg       Intake/Output Summary (Last 24 hours) at 04/04/2021 1223 Last data filed at 04/04/2021 1000 Gross per 24 hour  Intake 600 ml  Output 2750 ml  Net -2150 ml   Filed Weights   04/03/21 1606 04/04/21 0417  Weight: 77.7 kg 77.4 kg    Exam:  General: Appearance:     Overweight female in no acute distress     Lungs:      respirations unlabored  Heart:    Normal heart rate.   MS:   All extremities are intact.    Neurologic:   Awake, alert, oriented x 3. No apparent focal neurological           defect.      Data Reviewed:   I have personally reviewed following labs and imaging studies:  Labs: Labs show the following:   Basic Metabolic Panel: Recent Labs  Lab 04/01/21 0356 04/03/21 1056 04/03/21 1208 04/03/21 1230 04/04/21 0228  NA 137 137  --   --  140  K 3.4* 3.5  --   --  3.9  CL 103 104  --   --  110  CO2 29 27  --   --  23  GLUCOSE 135*  156*  --   --  182*  BUN 11 9  --   --  8  CREATININE 0.88 0.95  --   --  0.80  CALCIUM >15.0* >15.0*  --  >15.0* 13.9*  MG 2.2  --  2.0  --   --   PHOS 3.0  --  1.7*  --   --    GFR Estimated Creatinine Clearance: 89 mL/min (by C-G formula based on SCr of 0.8 mg/dL). Liver Function Tests: Recent Labs  Lab 04/03/21 1056  AST 14*  ALT 14  ALKPHOS 118  BILITOT 0.5  PROT 7.5  ALBUMIN 3.5   No results for input(s): LIPASE, AMYLASE in the last 168 hours. No results for input(s): AMMONIA in the last 168 hours. Coagulation profile No results for input(s): INR, PROTIME in the last 168 hours.  CBC: Recent Labs  Lab 04/03/21 1056  WBC 6.2  NEUTROABS 3.0   HGB 12.8  HCT 38.8  MCV 89.0  PLT 379   Cardiac Enzymes: No results for input(s): CKTOTAL, CKMB, CKMBINDEX, TROPONINI in the last 168 hours. BNP (last 3 results) No results for input(s): PROBNP in the last 8760 hours. CBG: No results for input(s): GLUCAP in the last 168 hours. D-Dimer: No results for input(s): DDIMER in the last 72 hours. Hgb A1c: No results for input(s): HGBA1C in the last 72 hours. Lipid Profile: No results for input(s): CHOL, HDL, LDLCALC, TRIG, CHOLHDL, LDLDIRECT in the last 72 hours. Thyroid function studies: Recent Labs    04/04/21 0228  TSH 0.403   Anemia work up: No results for input(s): VITAMINB12, FOLATE, FERRITIN, TIBC, IRON, RETICCTPCT in the last 72 hours. Sepsis Labs: Recent Labs  Lab 04/03/21 1056  WBC 6.2    Microbiology No results found for this or any previous visit (from the past 240 hour(s)).  Procedures and diagnostic studies:  No results found.  Medications:    calcitonin  300 Units Subcutaneous BID   diclofenac Sodium  2 g Topical QID   docusate sodium  100 mg Oral BID   escitalopram  10 mg Oral QHS   levothyroxine  150 mcg Oral QAC breakfast   metoprolol tartrate  25 mg Oral Daily   niMODipine  60 mg Oral Q4H   pantoprazole  40 mg Oral Daily   Continuous Infusions:  sodium chloride 200 mL/hr at 04/04/21 0844   sodium chloride       LOS: 1 day   Geradine Girt  [facility]   How to contact the [facility] Attending or Consulting provider [place] or covering provider during after hours [place], for this patient?  Check the care team in [facility] and look for a) attending/consulting TR[facility] provider listed and b) the [facility] team listed Log into [email or web address] and use [place]'s universal password to access. If you do not have the password, please contact the hospital operator. Locate the [facility] provider you are looking for under [facility] and page to a number that you can be directly reached. If you still have  difficulty reaching the provider, please page the [facility] (Director on Call) for the Hospitalists listed on amion for assistance.  04/04/2021, 12:23 PM

## 2021-04-05 ENCOUNTER — Encounter (HOSPITAL_COMMUNITY): Payer: Self-pay | Admitting: Internal Medicine

## 2021-04-05 ENCOUNTER — Inpatient Hospital Stay (HOSPITAL_COMMUNITY): Payer: BC Managed Care – PPO | Admitting: Anesthesiology

## 2021-04-05 ENCOUNTER — Encounter (HOSPITAL_COMMUNITY)
Admission: AD | Disposition: A | Discharge status: Home / Self Care | Payer: Self-pay | Source: Ambulatory Visit | Attending: Internal Medicine

## 2021-04-05 HISTORY — PX: PARATHYROIDECTOMY: SHX19

## 2021-04-05 LAB — PHOSPHORUS: Phosphorus: <1 mg/dL — CL (ref 2.5–4.6)

## 2021-04-05 LAB — BASIC METABOLIC PANEL
Anion gap: 6 (ref 5–15)
BUN: <5 mg/dL — ABNORMAL LOW (ref 6–20)
CO2: 21 mmol/L — ABNORMAL LOW (ref 22–32)
Calcium: 12.3 mg/dL — ABNORMAL HIGH (ref 8.9–10.3)
Chloride: 109 mmol/L (ref 98–111)
Creatinine, Ser: 0.64 mg/dL (ref 0.44–1.00)
GFR, Estimated: >60 mL/min (ref >60)
Glucose, Bld: 146 mg/dL — ABNORMAL HIGH (ref 70–99)
Potassium: 3.3 mmol/L — ABNORMAL LOW (ref 3.5–5.1)
Sodium: 136 mmol/L (ref 135–145)

## 2021-04-05 LAB — SARS CORONAVIRUS 2 BY RT PCR (HOSPITAL ORDER, PERFORMED IN ~~LOC~~ HOSPITAL LAB): SARS Coronavirus 2: NEGATIVE

## 2021-04-05 SURGERY — PARATHYROIDECTOMY
Anesthesia: General | Site: Neck | Laterality: Right

## 2021-04-05 MED ORDER — SUGAMMADEX SODIUM 200 MG/2ML IV SOLN
INTRAVENOUS | Status: DC | PRN
  Administered 2021-04-05: 200 mg via INTRAVENOUS

## 2021-04-05 MED ORDER — TRAMADOL HCL 50 MG PO TABS: 50.0000 mg | ORAL_TABLET | Freq: Four times a day (QID) | ORAL | Status: DC | PRN

## 2021-04-05 MED ORDER — MIDAZOLAM HCL 2 MG/2ML IJ SOLN
INTRAMUSCULAR | Status: AC
  Filled 2021-04-05: qty 2

## 2021-04-05 MED ORDER — ONDANSETRON HCL 4 MG/2ML IJ SOLN: 4.0000 mg | Freq: Once | INTRAMUSCULAR | Status: DC | PRN

## 2021-04-05 MED ORDER — HYDROMORPHONE HCL 1 MG/ML IJ SOLN: 0.5000 mg | INTRAMUSCULAR | Status: DC | PRN

## 2021-04-05 MED ORDER — FENTANYL CITRATE (PF) 250 MCG/5ML IJ SOLN
INTRAMUSCULAR | Status: AC
  Filled 2021-04-05: qty 5

## 2021-04-05 MED ORDER — OXYCODONE HCL 5 MG PO TABS
5.0000 mg | ORAL_TABLET | ORAL | Status: DC | PRN
  Administered 2021-04-06: 5 mg via ORAL
  Filled 2021-04-05: qty 1

## 2021-04-05 MED ORDER — HYDROMORPHONE HCL 1 MG/ML IJ SOLN: 1.0000 mg | INTRAMUSCULAR | Status: DC | PRN

## 2021-04-05 MED ORDER — ROCURONIUM BROMIDE 10 MG/ML (PF) SYRINGE
PREFILLED_SYRINGE | INTRAVENOUS | Status: AC
  Filled 2021-04-05: qty 10

## 2021-04-05 MED ORDER — KETOROLAC TROMETHAMINE 30 MG/ML IJ SOLN
INTRAMUSCULAR | Status: AC
  Administered 2021-04-05: 30 mg via INTRAVENOUS
  Filled 2021-04-05: qty 1

## 2021-04-05 MED ORDER — LIDOCAINE HCL (PF) 2 % IJ SOLN
INTRAMUSCULAR | Status: AC
  Filled 2021-04-05: qty 5

## 2021-04-05 MED ORDER — 0.9 % SODIUM CHLORIDE (POUR BTL) OPTIME
TOPICAL | Status: DC | PRN
  Administered 2021-04-05: 1000 mL

## 2021-04-05 MED ORDER — FENTANYL CITRATE PF 50 MCG/ML IJ SOSY
25.0000 ug | PREFILLED_SYRINGE | INTRAMUSCULAR | Status: DC | PRN
  Administered 2021-04-05: 50 ug via INTRAVENOUS
  Administered 2021-04-05: 25 ug via INTRAVENOUS

## 2021-04-05 MED ORDER — BUPIVACAINE-EPINEPHRINE (PF) 0.25% -1:200000 IJ SOLN
INTRAMUSCULAR | Status: AC
  Filled 2021-04-05: qty 30

## 2021-04-05 MED ORDER — CIPROFLOXACIN IN D5W 400 MG/200ML IV SOLN
400.0000 mg | Freq: Once | INTRAVENOUS | Status: AC
  Administered 2021-04-05: 400 mg via INTRAVENOUS
  Filled 2021-04-05: qty 200

## 2021-04-05 MED ORDER — PROPOFOL 10 MG/ML IV BOLUS
INTRAVENOUS | Status: DC | PRN
  Administered 2021-04-05: 200 mg via INTRAVENOUS

## 2021-04-05 MED ORDER — POTASSIUM PHOSPHATES 15 MMOLE/5ML IV SOLN
30.0000 mmol | Freq: Once | INTRAVENOUS | Status: DC
  Filled 2021-04-05 (×2): qty 10

## 2021-04-05 MED ORDER — PHENYLEPHRINE 40 MCG/ML (10ML) SYRINGE FOR IV PUSH (FOR BLOOD PRESSURE SUPPORT)
PREFILLED_SYRINGE | INTRAVENOUS | Status: DC | PRN
  Administered 2021-04-05 (×3): 80 ug via INTRAVENOUS
  Administered 2021-04-05 (×2): 40 ug via INTRAVENOUS

## 2021-04-05 MED ORDER — OXYCODONE HCL 5 MG PO TABS: 5.0000 mg | ORAL_TABLET | Freq: Once | ORAL | Status: DC | PRN

## 2021-04-05 MED ORDER — DEXAMETHASONE SODIUM PHOSPHATE 10 MG/ML IJ SOLN
INTRAMUSCULAR | Status: DC | PRN
  Administered 2021-04-05: 5 mg via INTRAVENOUS

## 2021-04-05 MED ORDER — FENTANYL CITRATE PF 50 MCG/ML IJ SOSY
PREFILLED_SYRINGE | INTRAMUSCULAR | Status: AC
  Administered 2021-04-05: 25 ug via INTRAVENOUS
  Filled 2021-04-05: qty 3

## 2021-04-05 MED ORDER — ONDANSETRON HCL 4 MG/2ML IJ SOLN
INTRAMUSCULAR | Status: DC | PRN
  Administered 2021-04-05: 4 mg via INTRAVENOUS

## 2021-04-05 MED ORDER — KETOROLAC TROMETHAMINE 30 MG/ML IJ SOLN: 30.0000 mg | Freq: Once | INTRAMUSCULAR | Status: AC

## 2021-04-05 MED ORDER — ONDANSETRON 4 MG PO TBDP: 4.0000 mg | ORAL_TABLET | Freq: Four times a day (QID) | ORAL | Status: DC | PRN

## 2021-04-05 MED ORDER — AMISULPRIDE (ANTIEMETIC) 5 MG/2ML IV SOLN: 10.0000 mg | Freq: Once | INTRAVENOUS | Status: DC | PRN

## 2021-04-05 MED ORDER — MICROFIBRILLAR COLL HEMOSTAT EX PADS
MEDICATED_PAD | CUTANEOUS | Status: DC | PRN
  Administered 2021-04-05: 1 via TOPICAL

## 2021-04-05 MED ORDER — FENTANYL CITRATE (PF) 250 MCG/5ML IJ SOLN
INTRAMUSCULAR | Status: DC | PRN
  Administered 2021-04-05: 50 ug via INTRAVENOUS
  Administered 2021-04-05: 100 ug via INTRAVENOUS
  Administered 2021-04-05 (×2): 50 ug via INTRAVENOUS

## 2021-04-05 MED ORDER — ACETAMINOPHEN 10 MG/ML IV SOLN
INTRAVENOUS | Status: AC
  Administered 2021-04-05: 1000 mg via INTRAVENOUS
  Filled 2021-04-05: qty 100

## 2021-04-05 MED ORDER — LIDOCAINE HCL (CARDIAC) PF 100 MG/5ML IV SOSY
PREFILLED_SYRINGE | INTRAVENOUS | Status: DC | PRN
  Administered 2021-04-05: 100 mg via INTRAVENOUS

## 2021-04-05 MED ORDER — PROPOFOL 10 MG/ML IV BOLUS
INTRAVENOUS | Status: AC
  Filled 2021-04-05: qty 20

## 2021-04-05 MED ORDER — LACTATED RINGERS IV SOLN: INTRAVENOUS | Status: DC

## 2021-04-05 MED ORDER — ACETAMINOPHEN 650 MG RE SUPP: 650.0000 mg | Freq: Four times a day (QID) | RECTAL | Status: DC | PRN

## 2021-04-05 MED ORDER — ONDANSETRON HCL 4 MG/2ML IJ SOLN: 4.0000 mg | Freq: Four times a day (QID) | INTRAMUSCULAR | Status: DC | PRN

## 2021-04-05 MED ORDER — ACETAMINOPHEN 10 MG/ML IV SOLN: 1000.0000 mg | Freq: Four times a day (QID) | INTRAVENOUS | Status: DC

## 2021-04-05 MED ORDER — HYDROMORPHONE HCL 1 MG/ML IJ SOLN
INTRAMUSCULAR | Status: AC
  Administered 2021-04-05: 0.5 mg via INTRAVENOUS
  Filled 2021-04-05: qty 2

## 2021-04-05 MED ORDER — ROCURONIUM BROMIDE 10 MG/ML (PF) SYRINGE
PREFILLED_SYRINGE | INTRAVENOUS | Status: DC | PRN
  Administered 2021-04-05: 70 mg via INTRAVENOUS

## 2021-04-05 MED ORDER — HYDRALAZINE HCL 20 MG/ML IJ SOLN: 10.0000 mg | Freq: Once | INTRAMUSCULAR | Status: AC

## 2021-04-05 MED ORDER — OXYCODONE HCL 5 MG/5ML PO SOLN: 5.0000 mg | Freq: Once | ORAL | Status: DC | PRN

## 2021-04-05 MED ORDER — MIDAZOLAM HCL 2 MG/2ML IJ SOLN
INTRAMUSCULAR | Status: DC | PRN
  Administered 2021-04-05: 2 mg via INTRAVENOUS

## 2021-04-05 MED ORDER — HYDRALAZINE HCL 20 MG/ML IJ SOLN
INTRAMUSCULAR | Status: AC
  Administered 2021-04-05: 10 mg via INTRAVENOUS
  Filled 2021-04-05: qty 1

## 2021-04-05 MED ORDER — SODIUM CHLORIDE 0.45 % IV SOLN: INTRAVENOUS | Status: DC

## 2021-04-05 MED ORDER — BUPIVACAINE HCL 0.25 % IJ SOLN
INTRAMUSCULAR | Status: DC | PRN
  Administered 2021-04-05: 9 mL

## 2021-04-05 MED ORDER — ACETAMINOPHEN 325 MG PO TABS
650.0000 mg | ORAL_TABLET | Freq: Four times a day (QID) | ORAL | Status: DC | PRN
  Administered 2021-04-06 – 2021-04-08 (×6): 650 mg via ORAL
  Filled 2021-04-05 (×7): qty 2

## 2021-04-05 MED ORDER — LABETALOL HCL 5 MG/ML IV SOLN
INTRAVENOUS | Status: AC
  Administered 2021-04-05: 10 mg
  Filled 2021-04-05: qty 4

## 2021-04-05 SURGICAL SUPPLY — 35 items
ATTRACTOMAT 16X20 MAGNETIC DRP (DRAPES) ×2 IMPLANT
BAG COUNTER SPONGE SURGICOUNT (BAG) ×2 IMPLANT
BLADE SURG 15 STRL LF DISP TIS (BLADE) ×1 IMPLANT
BLADE SURG 15 STRL SS (BLADE) ×2
CHLORAPREP W/TINT 26 (MISCELLANEOUS) ×2 IMPLANT
CLIP HORIZON TI SM (CLIP) ×1 IMPLANT
CLIP TI MEDIUM 6 (CLIP) ×4 IMPLANT
CLIP TI WIDE RED SMALL 6 (CLIP) ×5 IMPLANT
COVER SURGICAL LIGHT HANDLE (MISCELLANEOUS) ×2 IMPLANT
DERMABOND ADVANCED (GAUZE/BANDAGES/DRESSINGS) ×1
DERMABOND ADVANCED .7 DNX12 (GAUZE/BANDAGES/DRESSINGS) ×1 IMPLANT
DRAPE LAPAROTOMY T 98X78 PEDS (DRAPES) ×2 IMPLANT
DRAPE UTILITY XL STRL (DRAPES) ×2 IMPLANT
ELECT REM PT RETURN 15FT ADLT (MISCELLANEOUS) ×2 IMPLANT
GAUZE 4X4 16PLY ~~LOC~~+RFID DBL (SPONGE) ×2 IMPLANT
GLOVE SURG SYN 7.5  E (GLOVE) ×4
GLOVE SURG SYN 7.5 E (GLOVE) ×2 IMPLANT
GLOVE SURG SYN 7.5 PF PI (GLOVE) ×2 IMPLANT
GOWN STRL REUS W/TWL XL LVL3 (GOWN DISPOSABLE) ×6 IMPLANT
HEMOSTAT SURGICEL 2X4 FIBR (HEMOSTASIS) ×2 IMPLANT
ILLUMINATOR WAVEGUIDE N/F (MISCELLANEOUS) IMPLANT
KIT BASIN OR (CUSTOM PROCEDURE TRAY) ×2 IMPLANT
KIT TURNOVER KIT A (KITS) IMPLANT
NDL HYPO 25X1 1.5 SAFETY (NEEDLE) ×1 IMPLANT
NEEDLE HYPO 25X1 1.5 SAFETY (NEEDLE) ×2 IMPLANT
PACK BASIC VI WITH GOWN DISP (CUSTOM PROCEDURE TRAY) ×2 IMPLANT
PENCIL SMOKE EVACUATOR (MISCELLANEOUS) ×2 IMPLANT
SHEARS HARMONIC 9CM CVD (BLADE) ×1 IMPLANT
SUT MNCRL AB 4-0 PS2 18 (SUTURE) ×2 IMPLANT
SUT VIC AB 3-0 SH 18 (SUTURE) ×2 IMPLANT
SYR BULB IRRIG 60ML STRL (SYRINGE) ×2 IMPLANT
SYR CONTROL 10ML LL (SYRINGE) ×2 IMPLANT
TOWEL OR 17X26 10 PK STRL BLUE (TOWEL DISPOSABLE) ×2 IMPLANT
TOWEL OR NON WOVEN STRL DISP B (DISPOSABLE) ×2 IMPLANT
TUBING CONNECTING 10 (TUBING) ×2 IMPLANT

## 2021-04-05 NOTE — Progress Notes (Signed)
Inpatient Rehab Admissions Coordinator:   Consult received.  Following for possible readmit to CIR once medically cleared.  Will need to reinitiate insurance auth once she is ready.    Shann Medal, PT, DPT Admissions Coordinator [phone] 04/05/21  10:12 AM

## 2021-04-05 NOTE — Anesthesia Postprocedure Evaluation (Signed)
Anesthesia Post Note  Patient: Amanda Henry  Procedure(s) Performed: right inferioir PARATHYROIDECTOMY (Right: Neck)     Patient location during evaluation: PACU Anesthesia Type: General Level of consciousness: awake and alert Pain management: pain level controlled Vital Signs Assessment: post-procedure vital signs reviewed and stable Respiratory status: spontaneous breathing, nonlabored ventilation and respiratory function stable Cardiovascular status: blood pressure returned to baseline and stable Postop Assessment: no apparent nausea or vomiting Anesthetic complications: no   No notable events documented.  Last Vitals:  Vitals:   04/05/21 1705 04/05/21 1710  BP: (!) 172/87   Pulse: 69 70  Resp: 17 12  Temp:    SpO2: 100% 100%    Last Pain:  Vitals:   04/05/21 1622  TempSrc:   PainSc: 0-No pain                 Lidia Collum

## 2021-04-05 NOTE — Plan of Care (Signed)
  Problem: Skin Integrity: Goal: Risk for impaired skin integrity will decrease Outcome: Progressing   Problem: Pain Managment: Goal: General experience of comfort will improve Outcome: Completed/Met   Problem: Elimination: Goal: Will not experience complications related to urinary retention Outcome: Completed/Met

## 2021-04-05 NOTE — Progress Notes (Signed)
Progress Note     Subjective: Patient with some mild sore throat, she thinks possibly from dryness from being NPO. Discussed plan for transfer to [facility] so that Dr. Harlow Asa would be able to do her operation tentatively this afternoon and patient is agreeable to this plan.   Objective: Vital signs in last 24 hours: Temp:  [98 F (36.7 C)-98.4 F (36.9 C)] 98.4 F (36.9 C) (11/18 0549) Pulse Rate:  [61-81] 81 (11/18 0549) Resp:  [18-20] 20 (11/18 0549) BP: (135-169)/(69-81) 146/69 (11/18 0549) SpO2:  [92 %-100 %] 98 % (11/18 0549) Weight:  [77.1 kg] 77.1 kg (11/18 0549) Last BM Date: 04/03/21  Intake/Output from previous day: 11/17 0701 - 11/18 0700 In: 3125.3 [P.O.:460; I.V.:2665.3] Out: 3000 [Urine:3000] Intake/Output this shift: No intake/output data recorded.  PE: General: pleasant, WD, WN female who is laying in bed in NAD HEENT: staples present R scalp.  Sclera are anicteric.   Neck: no significant swelling or edema on palpation Heart: regular, rate, and rhythm. Palpable radial pulses bilaterally Lungs: no audible wheezing. Respiratory effort nonlabored Abd: soft, NT, ND MS: all 4 extremities are symmetrical with no cyanosis, clubbing, or edema. Skin: warm and dry with no masses, lesions, or rashes Neuro: Cranial nerves 2-12 grossly intact, sensation is normal throughout Psych: A&Ox3 with an appropriate affect.    Lab Results:  Recent Labs    04/03/21 1056  WBC 6.2  HGB 12.8  HCT 38.8  PLT 379   BMET Recent Labs    04/03/21 1056 04/03/21 1230 04/04/21 0228  NA 137  --  140  K 3.5  --  3.9  CL 104  --  110  CO2 27  --  23  GLUCOSE 156*  --  182*  BUN 9  --  8  CREATININE 0.95  --  0.80  CALCIUM >15.0*  15.8* >15.0* 13.9*   PT/INR No results for input(s): LABPROT, INR in the last 72 hours. CMP     Component Value Date/Time   NA 140 04/04/2021 0228   NA 137 05/31/2019 1646   NA 138 05/28/2016 1519   NA 138 04/21/2016 1417   K 3.9 04/04/2021  0228   K 4.1 09/10/2016 1326   K 3.6 05/28/2016 1519   K 3.6 04/21/2016 1417   CL 110 04/04/2021 0228   CL 102 09/10/2016 1326   CL 104 05/28/2016 1519   CO2 23 04/04/2021 0228   CO2 24 09/10/2016 1326   CO2 28 05/28/2016 1519   CO2 23 04/21/2016 1417   GLUCOSE 182 (H) 04/04/2021 0228   GLUCOSE 95 05/28/2016 1519   BUN 8 04/04/2021 0228   BUN 9 05/31/2019 1646   BUN 8 05/28/2016 1519   BUN 10.8 04/21/2016 1417   CREATININE 0.80 04/04/2021 0228   CREATININE 0.85 11/28/2020 0733   CREATININE 0.8 04/21/2016 1417   CALCIUM 13.9 (HH) 04/04/2021 0228   CALCIUM 15.8 (HH) 04/03/2021 1056   CALCIUM 10.4 04/21/2016 1417   PROT 7.5 04/03/2021 1056   PROT 7.3 06/14/2019 1746   PROT 7.5 05/28/2016 1519   PROT 8.2 04/21/2016 1417   ALBUMIN 3.5 04/03/2021 1056   ALBUMIN 4.1 05/31/2019 1646   ALBUMIN 4.0 09/10/2016 1326   ALBUMIN 3.5 04/21/2016 1417   AST 14 (L) 04/03/2021 1056   AST 33 09/10/2016 1326   AST 21 05/28/2016 1519   AST 13 04/21/2016 1417   ALT 14 04/03/2021 1056   ALT 10 09/10/2016 1326   ALT 23 05/28/2016  1519   ALT 11 04/21/2016 1417   ALKPHOS 118 04/03/2021 1056   ALKPHOS 79 09/10/2016 1326   ALKPHOS 76 05/28/2016 1519   ALKPHOS 120 04/21/2016 1417   BILITOT 0.5 04/03/2021 1056   BILITOT 0.4 05/31/2019 1646   BILITOT 0.50 05/28/2016 1519   BILITOT <0.22 04/21/2016 1417   GFRNONAA >60 04/04/2021 0228   GFRAA 103 05/31/2019 1646   Lipase  No results found for: LIPASE     Studies/Results: NM Parathyroid W/Spect  Result Date: 04/04/2021 CLINICAL DATA:  Hyperparathyroidism. EXAM: NM PARATHYROID SCINTIGRAPHY AND SPECT IMAGING TECHNIQUE: Following intravenous administration of radiopharmaceutical, early and 2-hour delayed planar images were obtained in the anterior projection. Delayed triplanar SPECT images were also obtained at 2 hours. RADIOPHARMACEUTICALS:  25.3 mCi Tc-72m Sestamibi IV COMPARISON:  Thyroid ultrasound 03/19/2021.  Neck CT 03/07/2021 FINDINGS:  There is a focus of persistent/delayed uptake in the lower right thyroid lobe consistent with a parathyroid adenoma. This appears to correlate with the CT and ultrasound findings of a 2 cm nodule in this area. 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 Electronically Signed   By: Marijo Sanes M.D.   On: 04/04/2021 14:59    Anti-infectives: Anti-infectives (From admission, onward)    None        Assessment/Plan Hypercalcemia secondary to primary hyperparathyroidism - secondary to below  R inferior parathyroid adenoma  - discussed with Dr. Harlow Asa and he is agreeable to have patient transferred to [facility] on hospitalist service and try to get right inferior parathyroidectomy done today. If unable to be done today, would likely be done early next week - keep NPO - discussed with patient and she is in agreement with this plan  FEN: NPO, IVF VTE: SCDs ID: cipro ordered for pre-op abx  SAH s/p EVD placement and coil embolization of small R anterior choroidal artery aneurysm HTN Hypothyroidism Iron deficiency anemia   LOS: 2 days    Norm Parcel, [facility] 04/05/2021, 7:43 AM Please see Amion for pager number during day hours 7:00am-4:30pm

## 2021-04-05 NOTE — Anesthesia Procedure Notes (Signed)
Procedure Name: Intubation Date/Time: 04/05/2021 2:45 PM Performed by: Raenette Rover, CRNA Pre-anesthesia Checklist: Patient identified, Emergency Drugs available, Suction available and Patient being monitored Patient Re-evaluated:Patient Re-evaluated prior to induction Oxygen Delivery Method: Circle system utilized Preoxygenation: Pre-oxygenation with 100% oxygen Induction Type: IV induction Ventilation: Mask ventilation without difficulty Laryngoscope Size: Mac and 3 Grade View: Grade I Tube type: Oral Tube size: 7.0 mm Number of attempts: 1 Airway Equipment and Method: Stylet Placement Confirmation: ETT inserted through vocal cords under direct vision, positive ETCO2 and breath sounds checked- equal and bilateral Secured at: 22 cm Tube secured with: Tape Dental Injury: Teeth and Oropharynx as per pre-operative assessment

## 2021-04-05 NOTE — Transfer of Care (Signed)
Immediate Anesthesia Transfer of Care Note  Patient: Amanda Henry  Procedure(s) Performed: right inferioir PARATHYROIDECTOMY (Right: Neck)  Patient Location: PACU  Anesthesia Type:General  Level of Consciousness: awake, drowsy and patient cooperative  Airway & Oxygen Therapy: Patient Spontanous Breathing and Patient connected to face mask oxygen  Post-op Assessment: Report given to RN, Post -op Vital signs reviewed and stable and Post -op Vital signs reviewed and unstable, Anesthesiologist notified  Post vital signs: Reviewed, stable and unstable--BP elevated from baseline. Two attempts with similar results. Patient in no distress. [facility] called to notify, and PACU RN informed that MDA wants Labetolol admin. Care resumed by PACU RN.   Last Vitals:  Vitals Value Taken Time  BP 183/95 04/05/21 1624  Temp    Pulse 70 04/05/21 1628  Resp 22 04/05/21 1628  SpO2 100 % 04/05/21 1628  Vitals shown include unvalidated device data.  Last Pain:  Vitals:   04/05/21 0900  TempSrc:   PainSc: 0-No pain         Complications: No notable events documented.

## 2021-04-05 NOTE — Discharge Instructions (Signed)
CENTRAL  SURGERY - Dr. Shaye Elling  THYROID & PARATHYROID SURGERY:  POST-OP INSTRUCTIONS  Always review the instruction sheet provided by the hospital nurse at discharge.  A prescription for pain medication may be sent to your pharmacy at the time of discharge.  Take your pain medication as prescribed.  If narcotic pain medicine is not needed, then you may take acetaminophen (Tylenol) or ibuprofen (Advil) as needed for pain or soreness.  Take your normal home medications as prescribed unless otherwise directed.  If you need a refill on your pain medication, please contact the office during regular business hours.  Prescriptions will not be processed by the office after 5:00PM or on weekends.  Start with a light diet upon arrival home, such as soup and crackers or toast.  Be sure to drink plenty of fluids.  Resume your normal diet the day after surgery.  Most patients will experience some swelling and bruising on the chest and neck area.  Ice packs will help for the first 48 hours after arriving home.  Swelling and bruising will take several days to resolve.   It is common to experience some constipation after surgery.  Increasing fluid intake and taking a stool softener (Colace) will usually help to prevent this problem.  A mild laxative (Milk of Magnesia or Miralax) should be taken according to package directions if there has been no bowel movement after 48 hours.  Dermabond glue covers your incision. This seals the wound and you may shower at any time. The Dermabond will remain in place for about a week.  You may gradually remove the glue when it loosens around the edges.  If you need to loosen the Dermabond for removal, apply a layer of Vaseline to the wound for 15 minutes and then remove with a Kleenex. Your sutures are under the skin and will not show - they will dissolve on their own.  You may resume light daily activities beginning the day after discharge (such as self-care,  walking, climbing stairs), gradually increasing activities as tolerated. You may have sexual intercourse when it is comfortable. Refrain from any heavy lifting or straining until approved by your doctor. You may drive when you no longer are taking prescription pain medication, you can comfortably wear a seatbelt, and you can safely maneuver your car and apply the brakes.  You will see your doctor in the office for a follow-up appointment approximately three weeks after your surgery.  Make sure that you call for this appointment within a day or two after you arrive home to insure a convenient appointment time. Please have any requested laboratory tests performed a few days prior to your office visit so that the results will be available at your follow up appointment.  WHEN TO CALL THE [facility] OFFICE: -- Fever greater than 101.5 -- Inability to urinate -- Nausea and/or vomiting - persistent -- Extreme swelling or bruising -- Continued bleeding from incision -- Increased pain, redness, or drainage from the incision -- Difficulty swallowing or breathing -- Muscle cramping or spasms -- Numbness or tingling in hands or around lips  The clinic staff is available to answer your questions during regular business hours.  Please don't hesitate to call and ask to speak to one of the nurses if you have concerns.  [facility] OFFICE: [phone] (24 hours)  Please sign up for MyChart accounts. This will allow you to communicate directly with my nurse or myself without having to call the office. It will also allow you   to view your test results. You will need to enroll in MyChart for my office ([facility]) and for the hospital ([place]).  Jennafer Gladue, MD [facility] A [facility] practice 

## 2021-04-05 NOTE — Progress Notes (Signed)
Progress Note    Amanda Henry  OMV:[id number] DOB: May 15, 1966  DOA: 04/03/2021 PCP: [name], FNP    Brief Narrative:     Medical records reviewed and are as summarized below:  Amanda Henry is an 55 y.o. female with medical history significant of hypertension, iron deficiency anemia, hypothyroidism, and hyperparathyroidism who had just recently been hospitalized on neurosurgery service from 10/28-11/14 after presenting with severe headache found to have subarachnoid hemorrhage.   Plan is for parathyroidectomy at [facility].    Assessment/Plan:   Principal Problem:   Hypercalcemia Active Problems:   Acquired hypothyroidism   Hypokalemia   History of subarachnoid hemorrhage   Hyperparathyroidism (HCC)   Hypercalcemia 2/2 hyperparathyroidism:  -Acute on chronic.  Patient presents with calcium levels greater than 15.  Records note calcium levels have been chronically elevated for which she had been being followed by Dr. Cruzita Lederer of [facility] endocrinology and was referred to Dr. Harlow Asa for need of parathyroidectomy.   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 -PT/OT to evaluate and treat- return to [facility]?   Hypokalemia:  -replete   Hypothyroidism -Continue levothyroxine -TSH normal   Myalgias: -question of secondary to hypercalcemia. -Continue Voltaren gel for now    Family Communication/Anticipated D/C date and plan/Code Status   DVT prophylaxis: scd Code Status: Full Code.  Disposition Plan: Status is: Inpatient  Remains inpatient appropriate because: needs further work up for calcium         Medical Consultants:   GS   Subjective:   Agreeable to go to [facility] for surgery  Objective:    Vitals:   04/04/21 1608 04/04/21 2107  04/04/21 2327 04/05/21 0549  BP: 138/80 135/81 (!) 169/78 (!) 146/69  Pulse: 69 69 81 81  Resp: 18 18 18 20   Temp: 98.1 F (36.7 C) 98.4 F (36.9 C) 98.4 F (36.9 C) 98.4 F (36.9 C)  TempSrc: Oral Oral Oral Oral  SpO2: 97% 99% 92% 98%  Weight:    77.1 kg    Intake/Output Summary (Last 24 hours) at 04/05/2021 0739 Last data filed at 04/05/2021 0400 Gross per 24 hour  Intake 3125.29 ml  Output 3000 ml  Net 125.29 ml   Filed Weights   04/03/21 1606 04/04/21 0417 04/05/21 0549  Weight: 77.7 kg 77.4 kg 77.1 kg    Exam:  General: Appearance:     Overweight female in no acute distress     Lungs:     respirations unlabored  Heart:    Normal heart rate.   MS:   All extremities are intact.    Neurologic:   Awake, alert, oriented x 3.       Data Reviewed:   I have personally reviewed following labs and imaging studies:  Labs: Labs show the following:   Basic Metabolic Panel: Recent Labs  Lab 04/01/21 0356 04/03/21 1056 04/03/21 1208 04/03/21 1230 04/04/21 0228  NA 137 137  --   --  140  K 3.4* 3.5  --   --  3.9  CL 103 104  --   --  110  CO2 29 27  --   --  23  GLUCOSE 135* 156*  --   --  182*  BUN 11 9  --   --  8  CREATININE 0.88 0.95  --   --  0.80  CALCIUM >15.0* >15.0*  15.8*  --  >15.0* 13.9*  MG 2.2  --  2.0  --   --   PHOS 3.0  --  1.7*  --   --    GFR Estimated Creatinine Clearance: 88.8 mL/min (by C-G formula based on SCr of 0.8 mg/dL). Liver Function Tests: Recent Labs  Lab 04/03/21 1056  AST 14*  ALT 14  ALKPHOS 118  BILITOT 0.5  PROT 7.5  ALBUMIN 3.5   No results for input(s): LIPASE, AMYLASE in the last 168 hours. No results for input(s): AMMONIA in the last 168 hours. Coagulation profile No results for input(s): INR, PROTIME in the last 168 hours.  CBC: Recent Labs  Lab 04/03/21 1056  WBC 6.2  NEUTROABS 3.0  HGB 12.8  HCT 38.8  MCV 89.0  PLT 379   Cardiac Enzymes: No results for input(s): CKTOTAL, CKMB, CKMBINDEX,  TROPONINI in the last 168 hours. BNP (last 3 results) No results for input(s): PROBNP in the last 8760 hours. CBG: No results for input(s): GLUCAP in the last 168 hours. D-Dimer: No results for input(s): DDIMER in the last 72 hours. Hgb A1c: No results for input(s): HGBA1C in the last 72 hours. Lipid Profile: No results for input(s): CHOL, HDL, LDLCALC, TRIG, CHOLHDL, LDLDIRECT in the last 72 hours. Thyroid function studies: Recent Labs    04/04/21 0228  TSH 0.403   Anemia work up: No results for input(s): VITAMINB12, FOLATE, FERRITIN, TIBC, IRON, RETICCTPCT in the last 72 hours. Sepsis Labs: Recent Labs  Lab 04/03/21 1056  WBC 6.2    Microbiology No results found for this or any previous visit (from the past 240 hour(s)).  Procedures and diagnostic studies:  NM Parathyroid W/Spect  Result Date: 04/04/2021 CLINICAL DATA:  Hyperparathyroidism. EXAM: NM PARATHYROID SCINTIGRAPHY AND SPECT IMAGING TECHNIQUE: Following intravenous administration of radiopharmaceutical, early and 2-hour delayed planar images were obtained in the anterior projection. Delayed triplanar SPECT images were also obtained at 2 hours. RADIOPHARMACEUTICALS:  25.3 mCi Tc-46m Sestamibi IV COMPARISON:  Thyroid ultrasound 03/19/2021.  Neck CT 03/07/2021 FINDINGS: There is a focus of persistent/delayed uptake in the lower right thyroid lobe consistent with a parathyroid adenoma. This appears to correlate with the CT and ultrasound findings of a 2 cm nodule in this area. 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 Electronically Signed   By: Marijo Sanes M.D.   On: 04/04/2021 14:59    Medications:    calcitonin  300 Units Subcutaneous BID   diclofenac Sodium  2 g Topical QID   docusate sodium  100 mg Oral BID   escitalopram  10 mg Oral QHS   levothyroxine  150 mcg Oral QAC breakfast   metoprolol tartrate  25 mg Oral Daily   niMODipine  60 mg Oral Q4H   pantoprazole  40 mg Oral Daily    Continuous Infusions:  sodium chloride 200 mL/hr at 04/05/21 0540   sodium chloride       LOS: 2 days   Geradine Girt  [facility]   How to contact the [facility] Attending or Consulting provider [place] or covering provider during after hours [place], for this patient?  Check the care team in [facility] and look for a) attending/consulting [facility] provider listed and b) the [facility] team listed Log into [email or web address] and use [place]'s universal password to access. If you do not have the password, please contact the hospital operator. Locate the [facility] provider you are looking for  under [facility] and page to a number that you can be directly reached. If you still have difficulty reaching the provider, please page the [facility] (Director on Call) for the Hospitalists listed on amion for assistance.  04/05/2021, 7:39 AM

## 2021-04-05 NOTE — Progress Notes (Signed)
Report given to short stay RN as well as carelink.

## 2021-04-05 NOTE — Op Note (Signed)
Operative Note  Pre-operative Diagnosis:  primary hyperparathyroidism  Post-operative Diagnosis:  same  Surgeon:  Armandina Gemma, MD  Assistant:  none   Procedure:  right inferior parathyroidectomy  Anesthesia:  general  Estimated Blood Loss:  minimal  Drains: none         Specimen: to pathology  Indications:  This is a 55 year old female who was recently referred for surgical evaluation of primary hyperparathyroidism.  The patient has had hypercalcemia for over one year.  She has had a markedly elevated calcium level and an elevated intact PTH level of 163. 24-hour urine collection for calcium was elevated at 390.  Patient underwent a thyroid ultrasound as well as a nuclear medicine parathyroid scan which both localize the adenoma to the right inferior thyroid lobe.  Patient now comes to surgery for resection.  Procedure:  The patient was seen in the pre-op holding area. The risks, benefits, complications, treatment options, and expected outcomes were previously discussed with the patient. The patient agreed with the proposed plan and has signed the informed consent form.  The patient was brought to the operating room by the surgical team, identified as Amanda Henry and the procedure verified. A "time out" was completed and the above information confirmed.  Following induction of general anesthesia, the patient was positioned and then prepped and draped in the usual aseptic fashion.  After ascertaining that an adequate level of anesthesia been achieved, a right anterior neck incision is made with a #15 blade.  Dissection is carried through subcutaneous tissues and the platysma.  Hemostasis is achieved with the electrocautery.  Skin flaps are developed circumferentially and a self-retaining retractor placed for exposure.  Strap muscles are incised in the midline and reflected to the right exposing the right thyroid lobe.  It appears that the right lobe has essentially been replaced by a  neoplasm involving the inferior pole and central portion of the thyroid.  This nodule was quite firm and may be complex with cystic and solid components.  Mass is gently mobilized.  Venous tributaries are divided between small ligaclips with the harmonic scalpel.  Gland is dissected away from the thyroid cartilage medially.  Superior vessels are divided between ligaclips with the harmonic scalpel.  There appears to be very little residual thyroid tissue.  Gland is rolled anteriorly.  The recurrent laryngeal nerve is identified along the lateral and posterior aspect of the mass.  It is intimately opposed to the posterior aspect of the mass.  It is gently mobilized with blunt dissection and maintained intact along its course.  The inferior thyroid artery is identified posteriorly.  It is dissected out and doubly clipped with small ligaclips and divided with the harmonic scalpel.  Gland is mobilized anteriorly.  The thyroid isthmus is divided with the harmonic scalpel.  The gland is then excised off the lateral aspect of the trachea using the electrocautery for hemostasis.  The entire mass is excised and submitted to pathology for frozen section.  This appears to be hypercellular parathyroid tissue consistent with adenoma.  There are some fibrous bands present.  I discussed from the operating room by speaker phone with the pathologist the possibility of this representing malignancy.  Pathology will evaluate the specimen as best as possible.  Right neck is irrigated with warm saline.  Good hemostasis is noted.  Fibrillar is placed throughout the operative field.  Strap muscles are reapproximated in the midline of interrupted 3-0 Vicryl sutures.  Platysma was closed with interrupted 3-0 Vicryl  sutures.  Skin edges are anesthetized with local anesthetic.  Skin edges are reapproximated with a running 4-0 Monocryl subcuticular suture.  Wound is washed and dried and Dermabond is applied as dressing.  Patient is  awakened from anesthesia and brought to the recovery room.  The patient tolerated the procedure well.   Armandina Gemma, [facility] Office: [phone]

## 2021-04-05 NOTE — Interval H&P Note (Signed)
History and Physical Interval Note:  04/05/2021 1:59 PM  Kerin Kren  has presented today for surgery, with the diagnosis of right inferior parathyroid adenoma.  The various methods of treatment have been discussed with the patient and family. After consideration of risks, benefits and other options for treatment, the patient has consented to    Procedure(s): right inferioir PARATHYROIDECTOMY (Right) as a surgical intervention.    The patient's history has been reviewed, patient examined, no change in status, stable for surgery.  I have reviewed the patient's chart and labs.  Questions were answered to the patient's satisfaction.    Armandina Gemma, [facility] [place] practice Office: [place]

## 2021-04-05 NOTE — Progress Notes (Signed)
PT Cancellation Note  Patient Details Name: Amanda Henry MRN: [id number] DOB: 12-05-1965   Cancelled Treatment:    Reason Eval/Treat Not Completed: Patient at procedure or test/unavailable. Will plan to follow-up later as time permits.   Moishe Spice, PT, DPT Acute Rehabilitation Services  Pager: [phone] Office: [place] 04/05/2021, 2:47 PM

## 2021-04-05 NOTE — Anesthesia Preprocedure Evaluation (Signed)
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)    GI/Hepatic negative GI ROS, Neg liver ROS,   Endo/Other  Hypothyroidism R inferior parathyroid adenoma  Renal/GU negative Renal ROS  negative genitourinary   Musculoskeletal negative musculoskeletal ROS (+)   Abdominal   Peds  Hematology negative hematology ROS (+)   Anesthesia Other Findings   Reproductive/Obstetrics                           Anesthesia Physical Anesthesia Plan  ASA: 4  Anesthesia Plan: General   Post-op Pain Management:    Induction: Intravenous  PONV Risk Score and Plan: 3 and Ondansetron, Dexamethasone, Treatment may vary due to age or medical condition and Midazolam  Airway Management Planned: Oral ETT  Additional Equipment:   Intra-op Plan:   Post-operative Plan: Extubation in OR  Informed Consent: I have reviewed the patients History and Physical, chart, labs and discussed the procedure including the risks, benefits and alternatives for the proposed anesthesia with the patient or authorized representative who has indicated his/her understanding and acceptance.     Dental advisory given  Plan Discussed with:   Anesthesia Plan Comments: ([place])        Anesthesia Quick Evaluation

## 2021-04-06 ENCOUNTER — Encounter (HOSPITAL_COMMUNITY): Payer: Self-pay | Admitting: Surgery

## 2021-04-06 LAB — BASIC METABOLIC PANEL
Anion gap: 6 (ref 5–15)
BUN: 10 mg/dL (ref 6–20)
CO2: 24 mmol/L (ref 22–32)
Calcium: 10.7 mg/dL — ABNORMAL HIGH (ref 8.9–10.3)
Chloride: 105 mmol/L (ref 98–111)
Creatinine, Ser: 0.9 mg/dL (ref 0.44–1.00)
GFR, Estimated: >60 mL/min (ref >60)
Glucose, Bld: 140 mg/dL — ABNORMAL HIGH (ref 70–99)
Potassium: 3 mmol/L — ABNORMAL LOW (ref 3.5–5.1)
Sodium: 135 mmol/L (ref 135–145)

## 2021-04-06 LAB — CBC WITH DIFFERENTIAL/PLATELET
Abs Immature Granulocytes: 0.01 10*3/uL (ref 0.00–0.07)
Basophils Absolute: 0 10*3/uL (ref 0.0–0.1)
Basophils Relative: 0 %
Eosinophils Absolute: 0.1 10*3/uL (ref 0.0–0.5)
Eosinophils Relative: 1 %
HCT: 33.2 % — ABNORMAL LOW (ref 36.0–46.0)
Hemoglobin: 11 g/dL — ABNORMAL LOW (ref 12.0–15.0)
Immature Granulocytes: 0 %
Lymphocytes Relative: 36 %
Lymphs Abs: 2.9 10*3/uL (ref 0.7–4.0)
MCH: 29.3 pg (ref 26.0–34.0)
MCHC: 33.1 g/dL (ref 30.0–36.0)
MCV: 88.3 fL (ref 80.0–100.0)
Monocytes Absolute: 0.6 10*3/uL (ref 0.1–1.0)
Monocytes Relative: 8 %
Neutro Abs: 4.5 10*3/uL (ref 1.7–7.7)
Neutrophils Relative %: 55 %
Platelets: 263 10*3/uL (ref 150–400)
RBC: 3.76 MIL/uL — ABNORMAL LOW (ref 3.87–5.11)
RDW: 13.9 % (ref 11.5–15.5)
WBC: 8.1 10*3/uL (ref 4.0–10.5)
nRBC: 0 % (ref 0.0–0.2)

## 2021-04-06 LAB — COMPREHENSIVE METABOLIC PANEL
ALT: 13 U/L (ref 0–44)
AST: 15 U/L (ref 15–41)
Albumin: 3.4 g/dL — ABNORMAL LOW (ref 3.5–5.0)
Alkaline Phosphatase: 109 U/L (ref 38–126)
Anion gap: 5 (ref 5–15)
BUN: 14 mg/dL (ref 6–20)
CO2: 22 mmol/L (ref 22–32)
Calcium: 11.1 mg/dL — ABNORMAL HIGH (ref 8.9–10.3)
Chloride: 109 mmol/L (ref 98–111)
Creatinine, Ser: 0.94 mg/dL (ref 0.44–1.00)
GFR, Estimated: >60 mL/min (ref >60)
Glucose, Bld: 188 mg/dL — ABNORMAL HIGH (ref 70–99)
Potassium: 3.3 mmol/L — ABNORMAL LOW (ref 3.5–5.1)
Sodium: 136 mmol/L (ref 135–145)
Total Bilirubin: 0.3 mg/dL (ref 0.3–1.2)
Total Protein: 6.5 g/dL (ref 6.5–8.1)

## 2021-04-06 LAB — GLUCOSE, CAPILLARY: Glucose-Capillary: 165 mg/dL — ABNORMAL HIGH (ref 70–99)

## 2021-04-06 MED ORDER — POTASSIUM CHLORIDE CRYS ER 20 MEQ PO TBCR
40.0000 meq | EXTENDED_RELEASE_TABLET | Freq: Four times a day (QID) | ORAL | Status: AC
  Administered 2021-04-06 (×2): 40 meq via ORAL
  Filled 2021-04-06 (×2): qty 2

## 2021-04-06 MED ORDER — SODIUM CHLORIDE 0.9 % IV BOLUS
1000.0000 mL | Freq: Once | INTRAVENOUS | Status: AC
  Administered 2021-04-07: 1000 mL via INTRAVENOUS

## 2021-04-06 NOTE — Progress Notes (Signed)
PROGRESS NOTE    Amanda Henry  OFB:[id number] DOB: 03-30-1966 DOA: 04/03/2021 PCP: [name], FNP    Brief Narrative:  55 y.o. female with medical history significant of hypertension, iron deficiency anemia, hypothyroidism, and hyperparathyroidism who had just recently been hospitalized on neurosurgery service from 10/28-11/14 after presenting with severe headache found to have subarachnoid hemorrhage.  Patient was transferred to [facility] for parathyroidectomy which she underwent on 04/05/2021  Assessment & Plan:   Principal Problem:   Hypercalcemia Active Problems:   Acquired hypothyroidism   Hypokalemia   History of subarachnoid hemorrhage   Hyperparathyroidism ([place])  Hypercalcemia 2/2 hyperparathyroidism:  -Acute on chronic.  Patient presents with calcium levels greater than 15.  Records note calcium levels have been chronically elevated for which she had been being followed by Dr. Cruzita Lederer of [facility] endocrinology and was referred to Dr. Harlow Asa for need of parathyroidectomy.   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 -PT/OT to evaluate and treat- -Anticipate return to CIR when cleared by general surgery   Hypokalemia:  -Low this morning at 3.0, will replace -Repeat basic metabolic panel in the morning   Hypothyroidism -Continue levothyroxine -TSH normal   Myalgias: -question of secondary to hypercalcemia. -Continue Voltaren gel for now    DVT prophylaxis: SCD's Code Status: Full Family Communication: Pt in room, family not at bedside  Status is: Inpatient  Remains inpatient appropriate because: Severity of  illness    Consultants:  General Surgery  Procedures:  Right inferior parathyroidectomy 04/05/2021  Antimicrobials: Anti-infectives (From admission, onward)    Start     Dose/Rate Route Frequency Ordered Stop   04/05/21 1200  ciprofloxacin (CIPRO) IVPB 400 mg        400 mg 200 mL/hr over 60 Minutes Intravenous  Once 04/05/21 0812 04/05/21 1553       Subjective: Reports some residual postop neck soreness, otherwise no other complaints  Objective: Vitals:   04/06/21 0545 04/06/21 0858 04/06/21 1051 04/06/21 1315  BP: (!) 97/52 135/62 127/79 138/66  Pulse: 68 78 78 70  Resp: 14 18 18 19   Temp: 97.7 F (36.5 C) 98.6 F (37 C) 98.3 F (36.8 C) 97.6 F (36.4 C)  TempSrc: Oral Oral Oral   SpO2: 98% 100% 100% 96%  Weight:        Intake/Output Summary (Last 24 hours) at 04/06/2021 1347 Last data filed at 04/06/2021 1234 Gross per 24 hour  Intake 1982.27 ml  Output 1210 ml  Net 772.27 ml   Filed Weights   04/03/21 1606 04/04/21 0417 04/05/21 0549  Weight: 77.7 kg 77.4 kg 77.1 kg    Examination: General exam: Awake, laying in bed, in nad Respiratory system: Normal respiratory effort, no wheezing Cardiovascular system: regular rate, s1, s2 Gastrointestinal system: Soft, nondistended, positive BS Central nervous system: CN2-12 grossly intact, strength intact Extremities: Perfused, no clubbing Skin: Normal skin turgor, no notable skin lesions seen Psychiatry: Mood normal // no visual hallucinations   Data Reviewed: I have personally reviewed following labs and imaging studies  CBC: Recent Labs  Lab 04/03/21 1056  WBC 6.2  NEUTROABS 3.0  HGB 12.8  HCT 38.8  MCV 89.0  PLT 782   Basic Metabolic Panel: Recent Labs  Lab  04/01/21 0356 04/03/21 1056 04/03/21 1208 04/03/21 1230 04/04/21 0228 04/05/21 0738 04/06/21 0525  NA 137 137  --   --  140 136 135  K 3.4* 3.5  --   --  3.9 3.3* 3.0*  CL 103 104  --   --  110 109 105  CO2 29 27  --   --  23 21* 24   GLUCOSE 135* 156*  --   --  182* 146* 140*  BUN 11 9  --   --  8 <5* 10  CREATININE 0.88 0.95  --   --  0.80 0.64 0.90  CALCIUM >15.0* >15.0*  15.8*  --  >15.0* 13.9* 12.3* 10.7*  MG 2.2  --  2.0  --   --   --   --   PHOS 3.0  --  1.7*  --   --  <1.0*  --    GFR: Estimated Creatinine Clearance: 79 mL/min (by C-G formula based on SCr of 0.9 mg/dL). Liver Function Tests: Recent Labs  Lab 04/03/21 1056  AST 14*  ALT 14  ALKPHOS 118  BILITOT 0.5  PROT 7.5  ALBUMIN 3.5   No results for input(s): LIPASE, AMYLASE in the last 168 hours. No results for input(s): AMMONIA in the last 168 hours. Coagulation Profile: No results for input(s): INR, PROTIME in the last 168 hours. Cardiac Enzymes: No results for input(s): CKTOTAL, CKMB, CKMBINDEX, TROPONINI in the last 168 hours. BNP (last 3 results) No results for input(s): PROBNP in the last 8760 hours. HbA1C: No results for input(s): HGBA1C in the last 72 hours. CBG: No results for input(s): GLUCAP in the last 168 hours. Lipid Profile: No results for input(s): CHOL, HDL, LDLCALC, TRIG, CHOLHDL, LDLDIRECT in the last 72 hours. Thyroid Function Tests: Recent Labs    04/04/21 0228  TSH 0.403   Anemia Panel: No results for input(s): VITAMINB12, FOLATE, FERRITIN, TIBC, IRON, RETICCTPCT in the last 72 hours. Sepsis Labs: No results for input(s): PROCALCITON, LATICACIDVEN in the last 168 hours.  Recent Results (from the past 240 hour(s))  SARS Coronavirus 2 by RT PCR (hospital order, performed in [facility] hospital lab) Nasopharyngeal Nasopharyngeal Swab     Status: None   Collection Time: 04/05/21  8:11 AM   Specimen: Nasopharyngeal Swab  Result Value Ref Range Status   SARS Coronavirus 2 NEGATIVE NEGATIVE Final    Comment: (NOTE) SARS-CoV-2 target nucleic acids are NOT DETECTED.  The SARS-CoV-2 RNA is generally detectable in upper and lower respiratory specimens during the acute phase of infection. The lowest concentration  of SARS-CoV-2 viral copies this assay can detect is 250 copies / mL. A negative result does not preclude SARS-CoV-2 infection and should not be used as the sole basis for treatment or other patient management decisions.  A negative result may occur with improper specimen collection / handling, submission of specimen other than nasopharyngeal swab, presence of viral mutation(s) within the areas targeted by this assay, and inadequate number of viral copies (<250 copies / mL). A negative result must be combined with clinical observations, patient history, and epidemiological information.  Fact Sheet for Patients:   StrictlyIdeas.no  Fact Sheet for Healthcare Providers: BankingDealers.co.za  This test is not yet approved or  cleared by the [place] FDA and has been authorized for detection and/or diagnosis of SARS-CoV-2 by FDA under an Emergency Use Authorization (EUA).  This EUA will remain in effect (meaning this test can be used) for the duration of the COVID-19  declaration under Section 564(b)(1) of the Act, 21 U.S.C. section 360bbb-3(b)(1), unless the authorization is terminated or revoked sooner.  Performed at [facility], [place] [address] [place], [place] [address]      Radiology Studies: No results found.  Scheduled Meds:  potassium chloride  40 mEq Oral Q6H   Continuous Infusions:   LOS: 3 days   Marylu Lund, MD [facility] Pager On Amion  If 7PM-7AM, please contact night-coverage 04/06/2021, 1:47 PM

## 2021-04-06 NOTE — Progress Notes (Signed)
Pt c/o syncope episodes 3 since 1900. First when she got up to go the bathroom and attempt to shower. Stated, " I got dizzy and my feet felt tingly." NT Karin Golden was with pt at this time. Returned pt to bed and states she has had 2 more small episodes while laying in bed. Pt c/o of surgical pain and headache.pain scale 1-10 stated pain an 8. Was only taking tylenol first dose of oxy at 2222 al syncopic episodes prior to administration. Had not had an episode since.  Informed on call [place], new orders received. EKG, CBG, Orthostatic BP, d/c PAIN meds. Also, placed pt on purwick for now as she was independent up to bathroom. EKG normal, CBG 165 hx of prediabetes. Continuing to monitor and assess.

## 2021-04-06 NOTE — Progress Notes (Signed)
`  1 Day Post-Op   Subjective/Chief Complaint: Pt doing well this AM Some soreness to neck   Objective: Vital signs in last 24 hours: Temp:  [97.7 F (36.5 C)-98.6 F (37 C)] 97.7 F (36.5 C) (11/19 0545) Pulse Rate:  [67-83] 68 (11/19 0545) Resp:  [10-20] 14 (11/19 0545) BP: (97-202)/(52-103) 97/52 (11/19 0545) SpO2:  [98 %-100 %] 98 % (11/19 0545) Last BM Date: 04/03/21  Intake/Output from previous day: 11/18 0701 - 11/19 0700 In: 1958.3 [I.V.:1758.3; IV Piggyback:200] Out: 1960 [Urine:1950; Blood:10] Intake/Output this shift: No intake/output data recorded.  General appearance: alert and cooperative Neck: inc c/d/I, some edema  Lab Results:  Recent Labs    04/03/21 1056  WBC 6.2  HGB 12.8  HCT 38.8  PLT 379   BMET Recent Labs    04/05/21 0738 04/06/21 0525  NA 136 135  K 3.3* 3.0*  CL 109 105  CO2 21* 24  GLUCOSE 146* 140*  BUN <5* 10  CREATININE 0.64 0.90  CALCIUM 12.3* 10.7*   PT/INR No results for input(s): LABPROT, INR in the last 72 hours. ABG No results for input(s): PHART, HCO3 in the last 72 hours.  Invalid input(s): PCO2, PO2  Studies/Results: NM Parathyroid W/Spect  Result Date: 04/04/2021 CLINICAL DATA:  Hyperparathyroidism. EXAM: NM PARATHYROID SCINTIGRAPHY AND SPECT IMAGING TECHNIQUE: Following intravenous administration of radiopharmaceutical, early and 2-hour delayed planar images were obtained in the anterior projection. Delayed triplanar SPECT images were also obtained at 2 hours. RADIOPHARMACEUTICALS:  25.3 mCi Tc-51m Sestamibi IV COMPARISON:  Thyroid ultrasound 03/19/2021.  Neck CT 03/07/2021 FINDINGS: There is a focus of persistent/delayed uptake in the lower right thyroid lobe consistent with a parathyroid adenoma. This appears to correlate with the CT and ultrasound findings of a 2 cm nodule in this area. 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 Electronically Signed   By: Marijo Sanes M.D.   On:  04/04/2021 14:59    Anti-infectives: Anti-infectives (From admission, onward)    Start     Dose/Rate Route Frequency Ordered Stop   04/05/21 1200  ciprofloxacin (CIPRO) IVPB 400 mg        400 mg 200 mL/hr over 60 Minutes Intravenous  Once 04/05/21 0762 04/05/21 1553       Assessment/Plan: s/p Procedure(s): right inferioir PARATHYROIDECTOMY (Right) Advance diet as tol Ca level normal today will recheck tomorrow Will need to return to rehab at [facility] when available  LOS: 3 days    Ralene Ok 04/06/2021

## 2021-04-06 NOTE — Progress Notes (Signed)
Pt successfully ambulated to bathroom independently with RN supervision.  Voiding well, no bm yet.  Tolerated breakfast well, IVF d/c per order parameters.  Ambulated in hallway approx 150' independently with RN supervision.

## 2021-04-07 LAB — COMPREHENSIVE METABOLIC PANEL
ALT: 11 U/L (ref 0–44)
AST: 14 U/L — ABNORMAL LOW (ref 15–41)
Albumin: 3.3 g/dL — ABNORMAL LOW (ref 3.5–5.0)
Alkaline Phosphatase: 97 U/L (ref 38–126)
Anion gap: 6 (ref 5–15)
BUN: 11 mg/dL (ref 6–20)
CO2: 21 mmol/L — ABNORMAL LOW (ref 22–32)
Calcium: 10.3 mg/dL (ref 8.9–10.3)
Chloride: 112 mmol/L — ABNORMAL HIGH (ref 98–111)
Creatinine, Ser: 0.79 mg/dL (ref 0.44–1.00)
GFR, Estimated: >60 mL/min (ref >60)
Glucose, Bld: 129 mg/dL — ABNORMAL HIGH (ref 70–99)
Potassium: 3.2 mmol/L — ABNORMAL LOW (ref 3.5–5.1)
Sodium: 139 mmol/L (ref 135–145)
Total Bilirubin: 0.4 mg/dL (ref 0.3–1.2)
Total Protein: 6.2 g/dL — ABNORMAL LOW (ref 6.5–8.1)

## 2021-04-07 LAB — PHOSPHORUS: Phosphorus: 1.8 mg/dL — ABNORMAL LOW (ref 2.5–4.6)

## 2021-04-07 MED ORDER — POTASSIUM CHLORIDE CRYS ER 20 MEQ PO TBCR
40.0000 meq | EXTENDED_RELEASE_TABLET | Freq: Two times a day (BID) | ORAL | Status: AC
  Administered 2021-04-07 (×2): 40 meq via ORAL
  Filled 2021-04-07 (×2): qty 2

## 2021-04-07 MED ORDER — ESCITALOPRAM OXALATE 10 MG PO TABS
10.0000 mg | ORAL_TABLET | Freq: Once | ORAL | Status: AC
  Administered 2021-04-07: 10 mg via ORAL
  Filled 2021-04-07: qty 1

## 2021-04-07 NOTE — Progress Notes (Signed)
PROGRESS NOTE    Amanda Henry  UXL:[id number] DOB: 1965/08/06 DOA: 04/03/2021 PCP: [name], FNP    Brief Narrative:  55 y.o. female with medical history significant of hypertension, iron deficiency anemia, hypothyroidism, and hyperparathyroidism who had just recently been hospitalized on neurosurgery service from 10/28-11/14 after presenting with severe headache found to have subarachnoid hemorrhage.  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  Patient presents with calcium levels greater than 15.  Records note calcium levels have been chronically elevated for which she had been being followed by Dr. Cruzita Lederer of [facility] endocrinology and was referred to Dr. Harlow Asa for need of parathyroidectomy.   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 -PT/OT to evaluate and treat- -Initial plan was for CIR. Seen by OT today, recs for outp therapy   Hypokalemia:  -Low this morning at 3.2, will replace -Recheck bmet in AM   Hypothyroidism -Continue levothyroxine -TSH normal   Myalgias: -question of secondary to hypercalcemia. -Continue Voltaren gel for now    DVT prophylaxis: SCD's Code Status: Full Family Communication: Pt in room, family not at bedside  Status is: Inpatient  Remains inpatient appropriate because: Severity  of illness    Consultants:  General Surgery  Procedures:  Right inferior parathyroidectomy 04/05/2021  Antimicrobials: Anti-infectives (From admission, onward)    Start     Dose/Rate Route Frequency Ordered Stop   04/05/21 1200  ciprofloxacin (CIPRO) IVPB 400 mg        400 mg 200 mL/hr over 60 Minutes Intravenous  Once 04/05/21 0812 04/05/21 1553       Subjective: Without complaints this AM. Eager to have a bath/shower  Objective: Vitals:   04/06/21 1315 04/06/21 2007 04/07/21 0626 04/07/21 1329  BP: 138/66 134/81 132/65 (!) 142/99  Pulse: 70 78 70 79  Resp: 19 16 16 19   Temp: 97.6 F (36.4 C) (!) 97.3 F (36.3 C) 97.8 F (36.6 C) 98.6 F (37 C)  TempSrc:  Oral Oral   SpO2: 96% 99% 98% 100%  Weight:        Intake/Output Summary (Last 24 hours) at 04/07/2021 1550 Last data filed at 04/07/2021 0900 Gross per 24 hour  Intake 1697.97 ml  Output --  Net 1697.97 ml    Filed Weights   04/03/21 1606 04/04/21 0417 04/05/21 0549  Weight: 77.7 kg 77.4 kg 77.1 kg    Examination: General exam: Conversant, in no acute distress Respiratory system: normal chest rise, clear, no audible wheezing Cardiovascular system: regular rhythm, s1-s2 Gastrointestinal system: Nondistended, nontender, pos BS Central nervous system: No seizures, no tremors Extremities: No cyanosis, no joint deformities Skin: No rashes, no pallor, healing scar over neck incision site Psychiatry: Affect normal // no auditory hallucinations   Data Reviewed: I have personally reviewed following labs and imaging studies  CBC: Recent Labs  Lab 04/03/21 1056 04/06/21 2316  WBC 6.2 8.1  NEUTROABS 3.0 4.5  HGB 12.8 11.0*  HCT 38.8 33.2*  MCV 89.0 88.3  PLT 379 229    Basic Metabolic Panel: Recent Labs  Lab 04/01/21 0356 04/03/21 1056 04/03/21 1208 04/03/21 1230 04/04/21 0228 04/05/21 0738 04/06/21 0525 04/06/21 2316 04/07/21 0534  NA 137   < >  --   --  140 136 135 136 139  K 3.4*   <  >  --   --  3.9 3.3* 3.0* 3.3* 3.2*  CL 103   < >  --   --  110 109 105 109 112*  CO2 29   < >  --   --  23 21* 24 22 21*  GLUCOSE 135*   < >  --   --  182* 146* 140* 188* 129*  BUN 11   < >  --   --  8 <5* 10 14 11   CREATININE 0.88   < >  --   --  0.80 0.64 0.90 0.94 0.79  CALCIUM >15.0*   < >  --    < > 13.9* 12.3* 10.7* 11.1* 10.3  MG 2.2  --  2.0  --   --   --   --   --   --   PHOS 3.0  --  1.7*  --   --  <1.0*  --   --  1.8*   < > = values in this interval not displayed.    GFR: Estimated Creatinine Clearance: 88.8 mL/min (by C-G formula based on SCr of 0.79 mg/dL). Liver Function Tests: Recent Labs  Lab 04/03/21 1056 04/06/21 2316 04/07/21 0534  AST 14* 15 14*  ALT 14 13 11   ALKPHOS 118 109 97  BILITOT 0.5 0.3 0.4  PROT 7.5 6.5 6.2*  ALBUMIN 3.5 3.4* 3.3*    No results for input(s): LIPASE, AMYLASE in the last 168 hours. No results for input(s): AMMONIA in the last 168 hours. Coagulation Profile: No results for input(s): INR, PROTIME in the last 168 hours. Cardiac Enzymes: No results for input(s): CKTOTAL, CKMB, CKMBINDEX, TROPONINI in the last 168 hours. BNP (last 3 results) No results for input(s): PROBNP in the last 8760 hours. HbA1C: No results for input(s): HGBA1C in the last 72 hours. CBG: Recent Labs  Lab 04/06/21 2322  GLUCAP 165*   Lipid Profile: No results for input(s): CHOL, HDL, LDLCALC, TRIG, CHOLHDL, LDLDIRECT in the last 72 hours. Thyroid Function Tests: No results for input(s): TSH, T4TOTAL, FREET4, T3FREE, THYROIDAB in the last 72 hours.  Anemia Panel: No results for input(s): VITAMINB12, FOLATE, FERRITIN, TIBC, IRON, RETICCTPCT in the last 72 hours. Sepsis Labs: No results for input(s): PROCALCITON, LATICACIDVEN in the last 168 hours.  Recent Results (from the past 240 hour(s))  SARS Coronavirus 2 by RT PCR (hospital order, performed in [facility] hospital lab) Nasopharyngeal Nasopharyngeal Swab     Status: None   Collection Time:  04/05/21  8:11 AM   Specimen: Nasopharyngeal Swab  Result Value Ref Range Status   SARS Coronavirus 2 NEGATIVE NEGATIVE Final    Comment: (NOTE) SARS-CoV-2 target nucleic acids are NOT DETECTED.  The SARS-CoV-2 RNA is generally detectable in upper and lower respiratory specimens during the acute phase of infection. The lowest concentration of SARS-CoV-2 viral copies this assay can detect is 250 copies / mL. A negative result does not preclude SARS-CoV-2 infection and should not be used as the sole basis for treatment or other patient management decisions.  A negative result may occur with improper specimen collection / handling, submission of specimen other  than nasopharyngeal swab, presence of viral mutation(s) within the areas targeted by this assay, and inadequate number of viral copies (<250 copies / mL). A negative result must be combined with clinical observations, patient history, and epidemiological information.  Fact Sheet for Patients:   StrictlyIdeas.no  Fact Sheet for Healthcare Providers: BankingDealers.co.za  This test is not yet approved or  cleared by the [place] FDA and has been authorized for detection and/or diagnosis of SARS-CoV-2 by FDA under an Emergency Use Authorization (EUA).  This EUA will remain in effect (meaning this test can be used) for the duration of the COVID-19 declaration under Section 564(b)(1) of the Act, 21 U.S.C. section 360bbb-3(b)(1), unless the authorization is terminated or revoked sooner.  Performed at [facility], [place] [address], [place], [place] [address]       Radiology Studies: No results found.  Scheduled Meds:  potassium chloride  40 mEq Oral BID   Continuous Infusions:   LOS: 4 days   Marylu Lund, MD [facility] Pager On Amion  If 7PM-7AM, please contact night-coverage 04/07/2021, 3:50 PM

## 2021-04-07 NOTE — Progress Notes (Signed)
2 Days Post-Op   Subjective/Chief Complaint: Pt with some tingling in her fingers last night, no cramping Tolerating diet well.   Objective: Vital signs in last 24 hours: Temp:  [97.3 F (36.3 C)-98.6 F (37 C)] 97.8 F (36.6 C) (11/20 0626) Pulse Rate:  [70-78] 70 (11/20 0626) Resp:  [16-19] 16 (11/20 0626) BP: (127-138)/(62-81) 132/65 (11/20 0626) SpO2:  [96 %-100 %] 98 % (11/20 0626) Last BM Date: 04/03/21  Intake/Output from previous day: 11/19 0701 - 11/20 0700 In: 1938 [P.O.:960; IV Piggyback:978] Out: -  Intake/Output this shift: No intake/output data recorded.  General appearance: alert and cooperative Incision/Wound: inc c/d/i  Lab Results:  Recent Labs    04/06/21 2316  WBC 8.1  HGB 11.0*  HCT 33.2*  PLT 263   BMET Recent Labs    04/06/21 2316 04/07/21 0534  NA 136 139  K 3.3* 3.2*  CL 109 112*  CO2 22 21*  GLUCOSE 188* 129*  BUN 14 11  CREATININE 0.94 0.79  CALCIUM 11.1* 10.3   Assessment/Plan: s/p Procedure(s): right inferioir PARATHYROIDECTOMY (Right) Ca appears to be normal will hold off on suppl K+ being replaced, may have contributed to tingling in her fingers Following    LOS: 4 days    Ralene Ok 04/07/2021

## 2021-04-07 NOTE — Progress Notes (Signed)
Occupational Therapy Treatment Patient Details Name: Amanda Henry MRN: [id number] DOB: 05-04-1966 Today's Date: 04/07/2021   History of present illness Amanda Henry is a 55 y.o. female who was admitted 04/03/21 from inpatient rehab due to calcium levels >15. 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 PMH: hypertension, iron deficiency anemia, hypothyroidism, and hyperparathyroidism. Patient s/p parathyroidectomy 11/18.   OT comments  Treatment focused on an informal assessment of cognition and continuing assessment of patient's physical abilities. Today patient has been independently ambulating in hallway, toileting and perform ADL tasks in her room. She has continued decreased strength in her upper extremities but overall functional. She continues to have some difficulty with problem solving, with memory, and retaining detailed information. See below for further details. Patient exhibits improvement with ADLs, balance and mobility with cognition being her biggest impairment. Discussed patient having someone stay with her initially at discharge and she says she can stay with her parents - in order for her to be monitored in home environment for safety concerns. Therapist recommended having some "assist" or check "her work" in regards to money and medication management. Therapist also recommended playing a game on her phone to work on her cognition. Patient agreeable and downloaded a game. Patient verbalized understanding of all education. Therapy will continue to follow acutely.   Recommendations for follow up therapy are one component of a multi-disciplinary discharge planning process, led by the attending physician.  Recommendations may be updated based on patient status, additional functional criteria and insurance authorization.     Follow Up Recommendations  Outpatient OT (OP Speech for Cognition)    Assistance Recommended at Discharge Frequent or constant Supervision/Assistance (Initially 24/7)  Equipment Recommendations  None recommended by OT    Recommendations for Other Services      Precautions / Restrictions Precautions Precautions: None Restrictions Weight Bearing Restrictions: No       Mobility Bed Mobility Overal bed mobility: Independent                  Transfers Overall transfer level: Independent                 General transfer comment: Has been independent in room with mobility. Ambulated in hall independently without nursing and with therapist. No loss of balance.     Balance Overall balance assessment: No apparent balance deficits (not formally assessed)                                         ADL either performed or assessed with clinical judgement   ADL Overall ADL's : Independent                                       General ADL Comments: Patient has been independent in room with ADLs - going to the bathroom and perofrming grooming at sink independently.    Extremity/Trunk Assessment Upper Extremity Assessment Upper Extremity Assessment: Overall WFL for tasks assessed            Vision   Vision Assessment?: No apparent visual deficits   Perception     Praxis      Cognition Arousal/Alertness: Awake/alert Behavior During Therapy: WFL for tasks assessed/performed Overall Cognitive Status: Impaired/Different  from baseline                                 General Comments: Patient is alert and oriented. She is alert to month, year, and Software engineer. She is able to grossly tell me what is happening in the news but not detailedly. She is able to accureately state the months of the year. She makes errors when subtracting in her head. She needs increased time and second attempt (due to spacing of numbers) to draw  a clock correctly. She's able to subract numbers written down from a total amount with use of calculator on phone - but initially confused by the calculators function (android calculator puts subtotal below calculatin) but eventually able to figure it out and reach correct answer. She was able to perform 2/3 tasks correctly - the second task she performed partially correct. She was able to report those three tasks 5 minutes later with some increased time and verbal cue. She needed assistance to [employer] on her app in order to download game for cognitive training. overall patient exhibits functional cognition - with increased time for problem solving and missing some details.          Exercises     Shoulder Instructions       General Comments      Pertinent Vitals/ Pain       Pain Assessment: No/denies pain  Home Living                                          Prior Functioning/Environment              Frequency  Min 2X/week        Progress Toward Goals  OT Goals(current goals can now be found in the care plan section)  Progress towards OT goals: Progressing toward goals  Acute Rehab OT Goals Patient Stated Goal: Improve memory OT Goal Formulation: With patient Time For Goal Achievement: 04/18/21 Potential to Achieve Goals: Good  Plan Discharge plan needs to be updated    Co-evaluation          OT goals addressed during session: ADL's and self-care (cognition)      AM-PAC OT "6 Clicks" Daily Activity     Outcome Measure   Help from another person eating meals?: None Help from another person taking care of personal grooming?: None Help from another person toileting, which includes using toliet, bedpan, or urinal?: None Help from another person bathing (including washing, rinsing, drying)?: None Help from another person to put on and taking off regular upper body clothing?: None Help from another person to put on and taking off  regular lower body clothing?: None 6 Click Score: 24    End of Session    OT Visit Diagnosis: Unsteadiness on feet (R26.81);Other symptoms and signs involving cognitive function Symptoms and signs involving cognitive functions: Nontraumatic SAH   Activity Tolerance Patient tolerated treatment well   Patient Left in bed   Nurse Communication  (okay to see)        Time: 1325-1401 OT Time Calculation (min): 36 min  Charges: OT General Charges $OT Visit: 1 Visit OT Treatments $Therapeutic Activity: 38-52 mins  Stclair Szymborski, OTR/L [place]  Office [phone] Pager: [place] 04/07/2021, 2:17 PM

## 2021-04-08 LAB — COMPREHENSIVE METABOLIC PANEL
ALT: 11 U/L (ref 0–44)
AST: 14 U/L — ABNORMAL LOW (ref 15–41)
Albumin: 3.3 g/dL — ABNORMAL LOW (ref 3.5–5.0)
Alkaline Phosphatase: 108 U/L (ref 38–126)
Anion gap: 6 (ref 5–15)
BUN: 11 mg/dL (ref 6–20)
CO2: 22 mmol/L (ref 22–32)
Calcium: 9.7 mg/dL (ref 8.9–10.3)
Chloride: 111 mmol/L (ref 98–111)
Creatinine, Ser: 0.73 mg/dL (ref 0.44–1.00)
GFR, Estimated: >60 mL/min (ref >60)
Glucose, Bld: 137 mg/dL — ABNORMAL HIGH (ref 70–99)
Potassium: 3.4 mmol/L — ABNORMAL LOW (ref 3.5–5.1)
Sodium: 139 mmol/L (ref 135–145)
Total Bilirubin: 0.6 mg/dL (ref 0.3–1.2)
Total Protein: 6.4 g/dL — ABNORMAL LOW (ref 6.5–8.1)

## 2021-04-08 LAB — SURGICAL PATHOLOGY

## 2021-04-08 LAB — MAGNESIUM: Magnesium: 1.7 mg/dL (ref 1.7–2.4)

## 2021-04-08 MED ORDER — LEVOTHYROXINE SODIUM 150 MCG PO TABS
150.0000 ug | ORAL_TABLET | Freq: Every day | ORAL | Status: DC
  Administered 2021-04-08: 150 ug via ORAL
  Filled 2021-04-08: qty 1

## 2021-04-08 MED ORDER — ALPRAZOLAM 0.5 MG PO TABS: 0.5000 mg | ORAL_TABLET | Freq: Two times a day (BID) | ORAL | Status: DC | PRN

## 2021-04-08 MED ORDER — POTASSIUM CHLORIDE CRYS ER 20 MEQ PO TBCR: 20.0000 meq | EXTENDED_RELEASE_TABLET | Freq: Every day | ORAL | 0 refills | Status: DC

## 2021-04-08 MED ORDER — ALPRAZOLAM ER 1 MG PO TB24: 1.0000 mg | ORAL_TABLET | Freq: Every day | ORAL | Status: DC | PRN

## 2021-04-08 MED ORDER — ESCITALOPRAM OXALATE 10 MG PO TABS: 10.0000 mg | ORAL_TABLET | Freq: Every day | ORAL | Status: DC

## 2021-04-08 MED ORDER — POTASSIUM CHLORIDE CRYS ER 20 MEQ PO TBCR
40.0000 meq | EXTENDED_RELEASE_TABLET | Freq: Two times a day (BID) | ORAL | Status: DC
  Administered 2021-04-08: 40 meq via ORAL
  Filled 2021-04-08: qty 2

## 2021-04-08 MED ORDER — CALCIUM CARBONATE ANTACID 500 MG PO CHEW
1.0000 | CHEWABLE_TABLET | Freq: Two times a day (BID) | ORAL | Status: DC
  Administered 2021-04-08: 200 mg via ORAL
  Filled 2021-04-08: qty 1

## 2021-04-08 MED ORDER — METOPROLOL TARTRATE 25 MG PO TABS
25.0000 mg | ORAL_TABLET | Freq: Every day | ORAL | Status: DC
  Administered 2021-04-08: 25 mg via ORAL
  Filled 2021-04-08: qty 1

## 2021-04-08 MED ORDER — CALCIUM CARBONATE ANTACID 500 MG PO CHEW: 1.0000 | CHEWABLE_TABLET | Freq: Two times a day (BID) | ORAL | 0 refills | Status: AC

## 2021-04-08 NOTE — Progress Notes (Signed)
Pt still experiencing high anxiety. When asked if she takes anything at home she stated that she does but hasn't had anything since her surgery. I asked what she takes and pt stated " I have them in my bag if you want to see, I haven't taken them in case the dr. Has me not taking them for a reason" and asked "do you knew why I havn't been given them?" Rx bottles proved where escitalopram 10 mg and alprazolam 1mg . advised I would reach out to on call and see what I could do. On call notified and 1 time order for escitalopram 10mg  ordered. Collected, counted and turned medications into pharmacy.

## 2021-04-08 NOTE — TOC Transition Note (Signed)
Transition of Care [facility]) - CM/SW Discharge Note   Patient Details  Name: Blayklee Mable MRN: [id number] Date of Birth: Jun 04, 1965  Transition of Care [facility]) CM/SW Contact:  Trish Mage, LCSW Phone Number: 04/08/2021, 3:32 PM   Clinical Narrative:   Patient who is stable for d/c will return home rather than return to CIR.  She is referred to [facility] on [facility].  Orders seen and appreciated.  Orders FAXed to same per their request.  No further needs identified.  TOC sign off.    Final next level of care: Home/Self Care     Patient Goals and CMS Choice        Discharge Placement                       Discharge Plan and Services                                     Social Determinants of Health (SDOH) Interventions     Readmission Risk Interventions No flowsheet data found.

## 2021-04-08 NOTE — Progress Notes (Addendum)
Patient ID: Amanda Henry, female   DOB: 06/13/1965, 55 y.o.   MRN: [id number] [facility] Progress Note  3 Days Post-Op  Subjective: CC-  Complains of some tingling in her fingertips again this morning but states that it is less than yesterday.  Otherwise no issues. Pain well controlled. No issues swallowing. Denies hoarseness.  Calcium 9.7. K 3.4  Objective: Vital signs in last 24 hours: Temp:  [97.4 F (36.3 C)-98.6 F (37 C)] 97.7 F (36.5 C) (11/21 0619) Pulse Rate:  [70-79] 70 (11/21 0619) Resp:  [15-19] 15 (11/21 0619) BP: (140-158)/(80-99) 158/80 (11/21 0619) SpO2:  [98 %-100 %] 98 % (11/21 0619) Last BM Date: 04/07/21  Intake/Output from previous day: 11/20 0701 - 11/21 0700 In: 240 [P.O.:240] Out: -  Intake/Output this shift: Total I/O In: 240 [P.O.:240] Out: -   PE: Gen:  Alert, NAD, pleasant HEENT: anterior neck incision cdi with some mild ecchymosis at distal aspect, no erythema or drainage  Lab Results:  Recent Labs    04/06/21 2316  WBC 8.1  HGB 11.0*  HCT 33.2*  PLT 263   BMET Recent Labs    04/07/21 0534 04/08/21 0508  NA 139 139  K 3.2* 3.4*  CL 112* 111  CO2 21* 22  GLUCOSE 129* 137*  BUN 11 11  CREATININE 0.79 0.73  CALCIUM 10.3 9.7   PT/INR No results for input(s): LABPROT, INR in the last 72 hours. CMP     Component Value Date/Time   NA 139 04/08/2021 0508   NA 137 05/31/2019 1646   NA 138 05/28/2016 1519   NA 138 04/21/2016 1417   K 3.4 (L) 04/08/2021 0508   K 4.1 09/10/2016 1326   K 3.6 05/28/2016 1519   K 3.6 04/21/2016 1417   CL 111 04/08/2021 0508   CL 102 09/10/2016 1326   CL 104 05/28/2016 1519   CO2 22 04/08/2021 0508   CO2 24 09/10/2016 1326   CO2 28 05/28/2016 1519   CO2 23 04/21/2016 1417   GLUCOSE 137 (H) 04/08/2021 0508   GLUCOSE 95 05/28/2016 1519   BUN 11 04/08/2021 0508   BUN 9 05/31/2019 1646   BUN 8 05/28/2016 1519   BUN 10.8 04/21/2016 1417   CREATININE 0.73 04/08/2021 0508    CREATININE 0.85 11/28/2020 0733   CREATININE 0.8 04/21/2016 1417   CALCIUM 9.7 04/08/2021 0508   CALCIUM 15.8 (HH) 04/03/2021 1056   CALCIUM 10.4 04/21/2016 1417   PROT 6.4 (L) 04/08/2021 0508   PROT 7.3 06/14/2019 1746   PROT 7.5 05/28/2016 1519   PROT 8.2 04/21/2016 1417   ALBUMIN 3.3 (L) 04/08/2021 0508   ALBUMIN 4.1 05/31/2019 1646   ALBUMIN 4.0 09/10/2016 1326   ALBUMIN 3.5 04/21/2016 1417   AST 14 (L) 04/08/2021 0508   AST 33 09/10/2016 1326   AST 21 05/28/2016 1519   AST 13 04/21/2016 1417   ALT 11 04/08/2021 0508   ALT 10 09/10/2016 1326   ALT 23 05/28/2016 1519   ALT 11 04/21/2016 1417   ALKPHOS 108 04/08/2021 0508   ALKPHOS 79 09/10/2016 1326   ALKPHOS 76 05/28/2016 1519   ALKPHOS 120 04/21/2016 1417   BILITOT 0.6 04/08/2021 0508   BILITOT 0.4 05/31/2019 1646   BILITOT 0.50 05/28/2016 1519   BILITOT <0.22 04/21/2016 1417   GFRNONAA >60 04/08/2021 0508   GFRAA 103 05/31/2019 1646   Lipase  No results found for: LIPASE     Studies/Results: No results found.  Anti-infectives:  Anti-infectives (From admission, onward)    Start     Dose/Rate Route Frequency Ordered Stop   04/05/21 1200  ciprofloxacin (CIPRO) IVPB 400 mg        400 mg 200 mL/hr over 60 Minutes Intravenous  Once 04/05/21 7124 04/05/21 1553        Assessment/Plan Primary hyperparathyroidism POD#3 s/p right inferior parathyroidectomy 11/18 Dr. Harlow Asa - tingling in her fingers persists but is improving - Ca remains WNL (9.7 from 10.3). ok for tums BID - K being replaced this morning -initial plan was for CIR but has progressed and now planning for outpatient therapies at discharge  - ok for discharge from surgical standpoint. 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

## 2021-04-08 NOTE — Progress Notes (Signed)
Pt discharged home. AVS printed and educational teaching completed with teach back method. PIV removed. VSS. Pt has all belongings. Home meds returned to pt. No further questions at this time.

## 2021-04-08 NOTE — Progress Notes (Signed)
Final path benign.  Good news.  Amanda Gemma, MD [facility] [place] practice Office: [phone]

## 2021-04-08 NOTE — Progress Notes (Signed)
Inpatient Rehab Admissions Coordinator:   Pt progressing extremely well and now set to d/c home with outpatient follow up.  Please place an Ambulatory Referral to the PM&R clinic so pt can f/u with our physicians as well.    Shann Medal, PT, DPT Admissions Coordinator [phone] 04/08/21  1:08 PM

## 2021-04-08 NOTE — Discharge Summary (Signed)
Physician Discharge Summary  Amanda Henry GEX:[id number] DOB: 1966-03-26 DOA: 04/03/2021  PCP: [name], FNP  Admit date: 04/03/2021 Discharge date: 04/08/2021  Admitted From: CIR Disposition:  Home  Recommendations for Outpatient Follow-up:  Follow up with PCP in 1-2 weeks Follow up with General Surgery as scheduled Recommend repeat basic metabolic panel in 1 week with focus on calcium and phosphorus as well as potassium  Discharge Condition:Stable CODE STATUS:Full Diet recommendation: Regular   Brief/Interim Summary: 55 y.o. female with medical history significant of hypertension, iron deficiency anemia, hypothyroidism, and hyperparathyroidism who had just recently been hospitalized on neurosurgery service from 10/28-11/14 after presenting with severe headache found to have subarachnoid hemorrhage.  Patient was transferred to 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  Patient presents with calcium levels greater than 15.  Records note calcium levels have been chronically elevated for which she had been being followed by Dr. Cruzita Lederer of [facility] endocrinology and was referred to Dr. Harlow Asa for need of parathyroidectomy.   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 -Patient participated  at [facility] prior to this admission -PT/OT has been following here -Patient is ambulatory and feels she is sufficiently able to go home and follow-up with outpatient therapy at this time.  Have discussed with social work and outpatient therapy has since been arranged   Hypokalemia:  -Low this morning at 3.4, will replace   Hypothyroidism -Continue levothyroxine -TSH normal   Myalgias: -question of secondary to hypercalcemia. -Continue Voltaren gel for now    Discharge Instructions  Discharge Instructions     Ambulatory referral to Occupational Therapy   Complete by: As directed    Ambulatory referral to Physical Medicine Rehab   Complete by: As directed    Ambulatory referral to Physical Therapy   Complete by: As directed       Allergies as of 04/08/2021       Reactions   Latex Rash   Penicillins Rash   Has patient had a PCN reaction causing immediate rash, facial/tongue/throat swelling, SOB or lightheadedness with hypotension: Yes Has patient had a PCN reaction causing severe rash involving mucus membranes or skin necrosis: No Has patient had a PCN reaction that required hospitalization No Has patient had a PCN reaction occurring within the last 10 years: No If all of the above answers are "NO", then may proceed with Cephalosporin use.        Medication List     STOP taking these medications    ibuprofen 600 MG tablet Commonly known as: ADVIL       TAKE these medications    acetaminophen 500 MG tablet Commonly known as: TYLENOL Take 1,000 mg by mouth every 6 (six) hours as needed for moderate pain or headache.   ALPRAZolam 1 MG 24 hr tablet Commonly known as: XANAX XR Take 1 mg by mouth daily as needed for sleep.   calcium carbonate 500 MG  chewable tablet Commonly known as: TUMS - dosed in mg elemental calcium Chew 1 tablet (200 mg of elemental calcium total) by mouth 2 (two) times daily.   escitalopram 10 MG tablet Commonly known as: LEXAPRO Take 1  tablet (10 mg total) by mouth daily. What changed: when to take this   ipratropium 0.06 % nasal spray Commonly known as: ATROVENT USE 2 SPRAYS IN EACH NOSTRIL THREE TIMES DAILY What changed: See the new instructions.   levothyroxine 150 MCG tablet Commonly known as: SYNTHROID TAKE 1 TABLET (150 MCG) BY MOUTH DAILY before breakfast What changed:  how much to take how to take this when to take this additional instructions   metoprolol tartrate 25 MG tablet Commonly known as: LOPRESSOR Take 1 tablet (25 mg total) by mouth daily.   niMODipine 30 MG capsule Commonly known as: NIMOTOP Take 2 capsules (60 mg total) by mouth every 4 (four) hours for 4 days. What changed:  how much to take when to take this   potassium chloride SA 20 MEQ tablet Commonly known as: KLOR-CON Take 1 tablet (20 mEq total) by mouth daily.   Vitamin D (Ergocalciferol) 1.25 MG (50000 UNIT) Caps capsule Commonly known as: DRISDOL TAKE 1 CAPSULE BY MOUTH 2 TIMES A WEEK What changed:  how much to take how to take this when to take this additional instructions        Follow-up Information     Armandina Gemma, MD. Go on 05/01/2021.   Specialty: General Surgery Why: Your appointment is 05/01/21 @ 11:15am  You need to have lab work (to check PTH and Calcium) prior to your appointment with Dr. Harlow Asa. Please go to [place] a few days prior to your appointment to have these drawn. Contact information: [place] [address] [phone]         [name], FNP Follow up in 1 week(s).   Specialty: General Practice Why: Hospital follow up Contact information: [address] [place] [address] [phone]                Allergies  Allergen Reactions   Latex Rash   Penicillins Rash    Has patient had a PCN reaction causing immediate rash, facial/tongue/throat swelling, SOB or lightheadedness with hypotension: Yes Has patient had a PCN reaction  causing severe rash involving mucus membranes or skin necrosis: No Has patient had a PCN reaction that required hospitalization No Has patient had a PCN reaction occurring within the last 10 years: No If all of the above answers are "NO", then may proceed with Cephalosporin use.     Consultations: General surgery  Procedures/Studies: CT Angio Head W or Wo Contrast  Result Date: 03/15/2021 CLINICAL DATA:  Subarachnoid hemorrhage workup EXAM: CT ANGIOGRAPHY HEAD AND NECK TECHNIQUE: Multidetector CT imaging of the head and neck was performed using the standard protocol during bolus administration of intravenous contrast. Multiplanar CT image reconstructions and MIPs were obtained to evaluate the vascular anatomy. Carotid stenosis measurements (when applicable) are obtained utilizing NASCET criteria, using the distal internal carotid diameter as the denominator. CONTRAST:  195mL OMNIPAQUE IOHEXOL 350 MG/ML SOLN COMPARISON:  Head CT from earlier today FINDINGS: CTA NECK FINDINGS Aortic arch: 2 vessel branching. Right carotid system: Mild atheromatous plaque at the by for K shin. No stenosis or ulceration. Left carotid system: Low-density plaque on the posterior wall of the proximal ICA with 50% stenosis as measured on sagittal reformats. Vertebral arteries: No proximal subclavian stenosis.  Fairly codominant vertebral arteries with no stenosis or beading. Skeleton: Negative Other neck: 13 mm right thyroid nodule. No followup recommended (ref: J Am Coll Radiol. 2015 Feb;12(2): 143-50). Upper chest: Negative Review of the MIP images confirms the above findings CTA HEAD FINDINGS Anterior circulation: Diffusely attenuated vessels without focal stenosis or beading. Provided history would be inconsistent with vawsospasm from aneurysmal hemorrhage. No beading to imply vasculopathy. No detected aneurysm or vascular malformation Posterior circulation: Diffusely attenuated vessel. No visible aneurysm, dissection, or  vascular malformation. Venous sinuses: Non-opacified Anatomic variants: None significant Review of the MIP images confirms the above findings IMPRESSION: 1. No detected aneurysm or vascular malformation. Recommend catheter angiography. 2. 50% atheromatous narrowing at the left ICA origin. Electronically Signed   By: Jorje Guild M.D.   On: 03/15/2021 07:12   CT HEAD WO CONTRAST (5MM)  Result Date: 03/16/2021 CLINICAL DATA:  Subarachnoid hemorrhage. Increasing confusion. More difficult to arouse. EXAM: CT HEAD WITHOUT CONTRAST TECHNIQUE: Contiguous axial images were obtained from the base of the skull through the vertex without intravenous contrast. COMPARISON:  CT head without contrast 03/15/2021 FINDINGS: Brain: Diffuse subarachnoid hemorrhage again noted. Hemorrhage is present in the suprasellar cisterns, slightly asymmetric on the right. Hemorrhage extends into the sylvian fissure bilaterally. Blood products are noted along the anterior falx. Intraventricular blood is seen layering in the posterior horns of the lateral ventricles bilaterally. Progressive dilation of the lateral and third ventricles noted. The fourth ventricle is also dilated relative to the prior study. Blood is noted within the aqueduct of Sylvius and in the foramina of the fourth ventricle. Blood products are again noted in the prepontine space. No acute cortical infarct present. Vascular: No hyperdense vessel or unexpected calcification. Skull: Calvarium is intact. No focal lytic or blastic lesions are present. No significant extracranial soft tissue lesion is present. Sinuses/Orbits: The paranasal sinuses and mastoid air cells are clear. Other: IMPRESSION: 1. Persistent diffuse subarachnoid hemorrhage without definite new bleed. 2. Hemorrhage is again noted in the suprasellar cisterns, slightly asymmetric on the right. 3. Blood products are more prominent within the aqueduct of Sylvius and in the foramina of the fourth ventricle. 4.  Progressive dilation of the lateral, third and fourth ventricles compatible with developing hydrocephalus. 5. Intraventricular blood is layering in the posterior horns of the lateral ventricles bilaterally. 6. No acute cortical infarct. Critical Value/emergent results were called by telephone at the time of interpretation on 03/16/2021 at 10:21 am to provider Sherley Bounds, who verbally acknowledged these results. Electronically Signed   By: San Morelle M.D.   On: 03/16/2021 10:29   CT HEAD WO CONTRAST (5MM)  Result Date: 03/15/2021 CLINICAL DATA:  Awoke with headache. EXAM: CT HEAD WITHOUT CONTRAST TECHNIQUE: Contiguous axial images were obtained from the base of the skull through the vertex without intravenous contrast. COMPARISON:  02/14/2016 FINDINGS: Brain: Subarachnoid hemorrhage in the basal cisterns and lower sylvian fissures with intraventricular reflux to the level of the foramen Monro. Early ventriculomegaly. A 3 mm parenchymal bleed could be present in the left corona radiata, attention on thin-section follow-up. Vascular: No hyperdense vessel or unexpected calcification. Skull: Normal. Negative for fracture or focal lesion. Sinuses/Orbits: No acute finding. Other: Critical Value/emergent results were called by telephone at the time of interpretation on 03/15/2021 at 5:45 am to provider [facility] , who verbally acknowledged these results. IMPRESSION: 1. Subarachnoid hemorrhage with aneurysmal pattern.  Recommend CTA. 2. Intraventricular reflux of blood and mild ventriculomegaly. Electronically Signed   By: Jorje Guild  M.D.   On: 03/15/2021 05:46   CT Angio Neck W and/or Wo Contrast  Result Date: 03/15/2021 CLINICAL DATA:  Subarachnoid hemorrhage workup EXAM: CT ANGIOGRAPHY HEAD AND NECK TECHNIQUE: Multidetector CT imaging of the head and neck was performed using the standard protocol during bolus administration of intravenous contrast. Multiplanar CT image reconstructions and MIPs  were obtained to evaluate the vascular anatomy. Carotid stenosis measurements (when applicable) are obtained utilizing NASCET criteria, using the distal internal carotid diameter as the denominator. CONTRAST:  126mL OMNIPAQUE IOHEXOL 350 MG/ML SOLN COMPARISON:  Head CT from earlier today FINDINGS: CTA NECK FINDINGS Aortic arch: 2 vessel branching. Right carotid system: Mild atheromatous plaque at the by for K shin. No stenosis or ulceration. Left carotid system: Low-density plaque on the posterior wall of the proximal ICA with 50% stenosis as measured on sagittal reformats. Vertebral arteries: No proximal subclavian stenosis. Fairly codominant vertebral arteries with no stenosis or beading. Skeleton: Negative Other neck: 13 mm right thyroid nodule. No followup recommended (ref: J Am Coll Radiol. 2015 Feb;12(2): 143-50). Upper chest: Negative Review of the MIP images confirms the above findings CTA HEAD FINDINGS Anterior circulation: Diffusely attenuated vessels without focal stenosis or beading. Provided history would be inconsistent with vawsospasm from aneurysmal hemorrhage. No beading to imply vasculopathy. No detected aneurysm or vascular malformation Posterior circulation: Diffusely attenuated vessel. No visible aneurysm, dissection, or vascular malformation. Venous sinuses: Non-opacified Anatomic variants: None significant Review of the MIP images confirms the above findings IMPRESSION: 1. No detected aneurysm or vascular malformation. Recommend catheter angiography. 2. 50% atheromatous narrowing at the left ICA origin. Electronically Signed   By: Jorje Guild M.D.   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 Initial angiogram was negative however follow-up angiogram performed yesterday did reveal a right anterior choroidal artery  aneurysm. Patient therefore presents today for coil embolization. ACCESS: The technical aspects of the procedure as well as its potential risks and benefits were reviewed with the patient and the patient's family. These risks included but were not limited to stroke, intracranial hemorrhage, bleeding, infection, allergic reaction, damage to organs or vital structures, stroke, non-diagnostic procedure, and the catastrophic outcomes of heart attack, coma, and death. With an understanding of these risks, informed consent was obtained and witnessed. The patient was placed in the supine position on the angiography table and the skin of right groin prepped in the usual sterile fashion. The procedure was performed under general anesthesia. Ultrasound was used to directly visualize the right common femoral artery. Micro puncture needle was used to gain access to the artery, visualized with the ultrasound. Short 8 French sheath was then introduced using standard Seldinger technique. MEDICATIONS: HEPARIN: 2000 Units total. CONTRAST:  <See Chart> OMNIPAQUE IOHEXOL 300 MG/ML SOLNcc, Omnipaque 300 FLUOROSCOPY TIME:  FLUOROSCOPY TIME: See IR records TECHNIQUE: CATHETERS AND WIRES 5-French JB-1 catheter 180 cm 0.035" glidewire 6-French NeuronMax guide sheath 6-French Berenstein Select JB-1 catheter 0.058" CatV guidecatheter 150 cm XT 27 microcatheter Synchro 2 select  microwire Excelsior XT-17 microcatheter COILS USED Target 360 XL 2 mm x 3 cm (not deployed) Target 360 nano 2 mm x 3 cm Target 360 nano 1.5 mm x 3 cm VESSELS CATHETERIZED Right internal carotid Right middle cerebral artery Right common femoral VESSELS STUDIED Right internal carotid artery, head (pre embolization) Right internal carotid artery, head (during embolization) Right internal carotid artery, head (immediate  post-embolization) Right internal carotid artery, head (final control) Right common femoral PROCEDURAL NARRATIVE Under real-time fluoroscopy, the guide  sheath was advanced over the select catheter and glidewire into the descending aorta. The select catheter was then advanced into the cervical right internal carotid artery over the Glidewire. The guide sheath was then advanced into the cervical internal carotid artery. The Select catheter was removed and the 058 guide catheter was coaxially introduced over the 027 microcatheter and microwire. The microcatheter was then advanced under roadmap guidance into the supraclinoid internal carotid artery, followed by the right middle cerebral artery, M3 segment. This allowed advancement of the guide catheter into the proximal supraclinoid internal carotid artery. The 27 microcatheter was then removed and the 17 microcatheter was introduced over the microwire. The aneurysm was then catheterized. The above coils were then sequentially deployed with progressive exclusion of the aneurysm. Angiograms were taken during embolization to confirm patency of the internal carotid artery and good position of the coils. Cerebral angiography was performed immediately post embolization. The coiling catheter was then removed and the guide catheter withdrawn into the cervical internal carotid artery. Final control angiography was then performed from the guide catheter. The guide catheter and guide sheath were then synchronously removed without incident. FINDINGS: Right internal carotid, head (pre embolization): Injection reveals the presence of a widely patent ICA, M1, and A1 segments and their branches. Again noted is the proximally 3.3 by 1.7 mm aneurysm. Angiogram taken today from the guide catheter in the supraclinoid internal carotid artery reveals the tip of the aneurysm to be slightly more bulbous measuring approximately 1.9 mm in width. The parenchymal and venous phases are normal. The venous sinuses are widely patent. Right internal carotid artery, head (during embolization): Injection reveals the presence of a widely patent ICA that  leads to a patent ACA and MCA. Coil mass within the aneurysm is stable, without coil prolapse or filling defect to suggest thrombus. There is progressive exclusion of the aneurysm with the 2 deployed coils. Right internal carotid artery, head (immediate post-embolization): Injection reveals the presence of a widely patent ICA that leads to a patent ACA and MCA. Coil mass within the aneurysm is stable, without coil prolapse or filling defect to suggest thrombus. There is miniscule aneurysm filling at the neck. No filling of the dome of the aneurysm is seen. Right internal carotid artery, head (final control): Injection reveals the presence of a widely patent ICA that leads to a patent ACA and MCA. No thrombus is visualized. Capillary phase does not demonstrate any perfusion deficits. Venous sinuses are patent and unremarkable. Right femoral: Normal vessel. No significant atherosclerotic disease. Arterial sheath in adequate position. DISPOSITION: Upon completion of the study, the femoral sheath was removed and hemostasis obtained using a 7-Fr ExoSeal closure device. Good proximal and distal lower extremity pulses were documented upon achievement of hemostasis. The procedure was well tolerated and no early complications were observed. The patient was transferred to the postanesthesia care unit in stable hemodynamic condition. IMPRESSION: 1. 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 The preliminary results of this procedure were shared with the patient's family. 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 Initial angiogram was negative however follow-up angiogram performed yesterday did reveal a right anterior choroidal artery  aneurysm. Patient therefore presents today for coil embolization. ACCESS: The technical aspects of the procedure as well as its potential risks and benefits were reviewed with the patient and the patient's family. These risks included but were not limited to stroke, intracranial hemorrhage, bleeding, infection, allergic reaction, damage to organs or vital structures, stroke, non-diagnostic procedure, and the catastrophic outcomes of heart attack, coma, and death. With an understanding of these risks, informed consent was obtained and witnessed. The patient was placed in the supine position on the angiography table and the skin of right groin prepped in the usual sterile fashion. The procedure was performed under general anesthesia. Ultrasound was used to directly visualize the right common femoral artery. Micro puncture needle was used to gain access to the artery, visualized with the ultrasound. Short 8 French sheath was then introduced using standard Seldinger technique. MEDICATIONS: HEPARIN: 2000 Units total. CONTRAST:  <See Chart> OMNIPAQUE IOHEXOL 300 MG/ML SOLNcc, Omnipaque 300 FLUOROSCOPY TIME:  FLUOROSCOPY TIME: See IR records TECHNIQUE: CATHETERS AND WIRES 5-French JB-1 catheter 180 cm 0.035" glidewire 6-French NeuronMax guide sheath 6-French Berenstein Select JB-1 catheter 0.058" CatV guidecatheter 150 cm XT 27 microcatheter Synchro 2 select  microwire Excelsior XT-17 microcatheter COILS USED Target 360 XL 2 mm x 3 cm (not deployed) Target 360 nano 2 mm x 3 cm Target 360 nano 1.5 mm x 3 cm VESSELS CATHETERIZED Right internal carotid Right middle cerebral artery Right common femoral VESSELS STUDIED Right internal carotid artery, head (pre embolization) Right internal carotid artery, head (during embolization) Right internal carotid artery, head (immediate post-embolization) Right internal carotid artery, head (final control) Right common femoral PROCEDURAL NARRATIVE Under real-time fluoroscopy, the guide  sheath was advanced over the select catheter and glidewire into the descending aorta. The select catheter was then advanced into the cervical right internal carotid artery over the Glidewire. The guide sheath was then advanced into the cervical internal carotid artery. The Select catheter was removed and the 058 guide catheter was coaxially introduced over the 027 microcatheter and microwire. The microcatheter was then advanced under roadmap guidance into the supraclinoid internal carotid artery, followed by the right middle cerebral artery, M3 segment. This allowed advancement of the guide catheter into the proximal supraclinoid internal carotid artery. The 27 microcatheter was then removed and the 17 microcatheter was introduced over the microwire. The aneurysm was then catheterized. The above coils were then sequentially deployed with progressive exclusion of the aneurysm. Angiograms were taken during embolization to confirm patency of the internal carotid artery and good position of the coils. Cerebral angiography was performed immediately post embolization. The coiling catheter was then removed and the guide catheter withdrawn into the cervical internal carotid artery. Final control angiography was then performed from the guide catheter. The guide catheter and guide sheath were then synchronously removed without incident. FINDINGS: Right internal carotid, head (pre embolization): Injection reveals the presence of a widely patent ICA, M1, and A1 segments and their branches. Again noted is the proximally 3.3 by 1.7 mm aneurysm. Angiogram taken today from the guide catheter in the supraclinoid internal carotid artery reveals the tip of the aneurysm to be slightly more bulbous measuring approximately 1.9 mm in width. The parenchymal and venous phases are normal. The venous sinuses are widely patent. Right internal carotid artery, head (during embolization): Injection reveals the presence of a widely patent ICA that  leads to a patent ACA and MCA. Coil mass within the aneurysm is stable, without coil prolapse or filling defect to suggest thrombus. There  is progressive exclusion of the aneurysm with the 2 deployed coils. Right internal carotid artery, head (immediate post-embolization): Injection reveals the presence of a widely patent ICA that leads to a patent ACA and MCA. Coil mass within the aneurysm is stable, without coil prolapse or filling defect to suggest thrombus. There is miniscule aneurysm filling at the neck. No filling of the dome of the aneurysm is seen. Right internal carotid artery, head (final control): Injection reveals the presence of a widely patent ICA that leads to a patent ACA and MCA. No thrombus is visualized. Capillary phase does not demonstrate any perfusion deficits. Venous sinuses are patent and unremarkable. Right femoral: Normal vessel. No significant atherosclerotic disease. Arterial sheath in adequate position. DISPOSITION: Upon completion of the study, the femoral sheath was removed and hemostasis obtained using a 7-Fr ExoSeal closure device. Good proximal and distal lower extremity pulses were documented upon achievement of hemostasis. The procedure was well tolerated and no early complications were observed. The patient was transferred to the postanesthesia care unit in stable hemodynamic condition. IMPRESSION: 1. 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 The preliminary results of this procedure were shared with the patient's family. 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 Initial angiogram was negative however follow-up angiogram performed yesterday did reveal a right anterior choroidal artery  aneurysm. Patient therefore presents today for coil embolization. ACCESS: The technical aspects of the procedure as well as its potential risks and benefits were reviewed with the patient and the patient's family. These risks included but were not limited to stroke, intracranial hemorrhage, bleeding, infection, allergic reaction, damage to organs or vital structures, stroke, non-diagnostic procedure, and the catastrophic outcomes of heart attack, coma, and death. With an understanding of these risks, informed consent was obtained and witnessed. The patient was placed in the supine position on the angiography table and the skin of right groin prepped in the usual sterile fashion. The procedure was performed under general anesthesia. Ultrasound was used to directly visualize the right common femoral artery. Micro puncture needle was used to gain access to the artery, visualized with the ultrasound. Short 8 French sheath was then introduced using standard Seldinger technique. MEDICATIONS: HEPARIN: 2000 Units total. CONTRAST:  <See Chart> OMNIPAQUE IOHEXOL 300 MG/ML SOLNcc, Omnipaque 300 FLUOROSCOPY TIME:  FLUOROSCOPY TIME: See IR records TECHNIQUE: CATHETERS AND WIRES 5-French JB-1 catheter 180 cm 0.035" glidewire 6-French NeuronMax guide sheath 6-French Berenstein Select JB-1 catheter 0.058" CatV guidecatheter 150 cm XT 27 microcatheter Synchro 2 select  microwire Excelsior XT-17 microcatheter COILS USED Target 360 XL 2 mm x 3 cm (not deployed) Target 360 nano 2 mm x 3 cm Target 360 nano 1.5 mm x 3 cm VESSELS CATHETERIZED Right internal carotid Right middle cerebral artery Right common femoral VESSELS STUDIED Right internal carotid artery, head (pre embolization) Right internal carotid artery, head (during embolization) Right internal carotid artery, head (immediate post-embolization) Right internal carotid artery, head (final control) Right common femoral PROCEDURAL NARRATIVE Under real-time fluoroscopy, the guide  sheath was advanced over the select catheter and glidewire into the descending aorta. The select catheter was then advanced into the cervical right internal carotid artery over the Glidewire. The guide sheath was then advanced into the cervical internal carotid artery. The Select catheter was  removed and the 058 guide catheter was coaxially introduced over the 027 microcatheter and microwire. The microcatheter was then advanced under roadmap guidance into the supraclinoid internal carotid artery, followed by the right middle cerebral artery, M3 segment. This allowed advancement of the guide catheter into the proximal supraclinoid internal carotid artery. The 27 microcatheter was then removed and the 17 microcatheter was introduced over the microwire. The aneurysm was then catheterized. The above coils were then sequentially deployed with progressive exclusion of the aneurysm. Angiograms were taken during embolization to confirm patency of the internal carotid artery and good position of the coils. Cerebral angiography was performed immediately post embolization. The coiling catheter was then removed and the guide catheter withdrawn into the cervical internal carotid artery. Final control angiography was then performed from the guide catheter. The guide catheter and guide sheath were then synchronously removed without incident. FINDINGS: Right internal carotid, head (pre embolization): Injection reveals the presence of a widely patent ICA, M1, and A1 segments and their branches. Again noted is the proximally 3.3 by 1.7 mm aneurysm. Angiogram taken today from the guide catheter in the supraclinoid internal carotid artery reveals the tip of the aneurysm to be slightly more bulbous measuring approximately 1.9 mm in width. The parenchymal and venous phases are normal. The venous sinuses are widely patent. Right internal carotid artery, head (during embolization): Injection reveals the presence of a widely patent ICA that  leads to a patent ACA and MCA. Coil mass within the aneurysm is stable, without coil prolapse or filling defect to suggest thrombus. There is progressive exclusion of the aneurysm with the 2 deployed coils. Right internal carotid artery, head (immediate post-embolization): Injection reveals the presence of a widely patent ICA that leads to a patent ACA and MCA. Coil mass within the aneurysm is stable, without coil prolapse or filling defect to suggest thrombus. There is miniscule aneurysm filling at the neck. No filling of the dome of the aneurysm is seen. Right internal carotid artery, head (final control): Injection reveals the presence of a widely patent ICA that leads to a patent ACA and MCA. No thrombus is visualized. Capillary phase does not demonstrate any perfusion deficits. Venous sinuses are patent and unremarkable. Right femoral: Normal vessel. No significant atherosclerotic disease. Arterial sheath in adequate position. DISPOSITION: Upon completion of the study, the femoral sheath was removed and hemostasis obtained using a 7-Fr ExoSeal closure device. Good proximal and distal lower extremity pulses were documented upon achievement of hemostasis. The procedure was well tolerated and no early complications were observed. The patient was transferred to the postanesthesia care unit in stable hemodynamic condition. IMPRESSION: 1. 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 The preliminary results of this procedure were shared with the patient's family. Electronically Signed   By: Consuella Lose   On: 03/26/2021 14:15   NM Parathyroid W/Spect  Result Date: 04/04/2021 CLINICAL DATA:  Hyperparathyroidism. EXAM: NM PARATHYROID SCINTIGRAPHY AND SPECT IMAGING TECHNIQUE: Following intravenous administration of radiopharmaceutical, early and 2-hour delayed planar images were obtained in the anterior projection. Delayed triplanar SPECT images were  also obtained at 2 hours. RADIOPHARMACEUTICALS:  25.3 mCi Tc-69m Sestamibi IV COMPARISON:  Thyroid ultrasound 03/19/2021.  Neck CT 03/07/2021 FINDINGS: There is a focus of persistent/delayed uptake in the lower right thyroid lobe consistent with a parathyroid adenoma. This appears to correlate with the CT and ultrasound findings of a 2 cm nodule in this area. 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  Electronically Signed   By: Marijo Sanes M.D.   On: 04/04/2021 14:59   IR US Guide Vasc Access Right  Result Date: 03/26/2021 PROCEDURE: COIL EMBOLIZATION OF RIGHT ANTERIOR CHOROIDAL ARTERY ANEURYSM HISTORY: The patient is a 55 year old woman previously admitted with subarachnoid hemorrhage. Initial angiogram was negative however follow-up angiogram performed yesterday did reveal a right anterior choroidal artery aneurysm. Patient therefore presents today for coil embolization. ACCESS: The technical aspects of the procedure as well as its potential risks and benefits were reviewed with the patient and the patient's family. These risks included but were not limited to stroke, intracranial hemorrhage, bleeding, infection, allergic reaction, damage to organs or vital structures, stroke, non-diagnostic procedure, and the catastrophic outcomes of heart attack, coma, and death. With an understanding of these risks, informed consent was obtained and witnessed. The patient was placed in the supine position on the angiography table and the skin of right groin prepped in the usual sterile fashion. The procedure was performed under general anesthesia. Ultrasound was used to directly visualize the right common femoral artery. Micro puncture needle was used to gain access to the artery, visualized with the ultrasound. Short 8 French sheath was then introduced using standard Seldinger technique. MEDICATIONS: HEPARIN: 2000 Units total. CONTRAST:  <See Chart> OMNIPAQUE IOHEXOL 300 MG/ML SOLNcc, Omnipaque  300 FLUOROSCOPY TIME:  FLUOROSCOPY TIME: See IR records TECHNIQUE: CATHETERS AND WIRES 5-French JB-1 catheter 180 cm 0.035" glidewire 6-French NeuronMax guide sheath 6-French Berenstein Select JB-1 catheter 0.058" CatV guidecatheter 150 cm XT 27 microcatheter Synchro 2 select  microwire Excelsior XT-17 microcatheter COILS USED Target 360 XL 2 mm x 3 cm (not deployed) Target 360 nano 2 mm x 3 cm Target 360 nano 1.5 mm x 3 cm VESSELS CATHETERIZED Right internal carotid Right middle cerebral artery Right common femoral VESSELS STUDIED Right internal carotid artery, head (pre embolization) Right internal carotid artery, head (during embolization) Right internal carotid artery, head (immediate post-embolization) Right internal carotid artery, head (final control) Right common femoral PROCEDURAL NARRATIVE Under real-time fluoroscopy, the guide sheath was advanced over the select catheter and glidewire into the descending aorta. The select catheter was then advanced into the cervical right internal carotid artery over the Glidewire. The guide sheath was then advanced into the cervical internal carotid artery. The Select catheter was removed and the 058 guide catheter was coaxially introduced over the 027 microcatheter and microwire. The microcatheter was then advanced under roadmap guidance into the supraclinoid internal carotid artery, followed by the right middle cerebral artery, M3 segment. This allowed advancement of the guide catheter into the proximal supraclinoid internal carotid artery. The 27 microcatheter was then removed and the 17 microcatheter was introduced over the microwire. The aneurysm was then catheterized. The above coils were then sequentially deployed with progressive exclusion of the aneurysm. Angiograms were taken during embolization to confirm patency of the internal carotid artery and good position of the coils. Cerebral angiography was performed immediately post embolization. The coiling catheter  was then removed and the guide catheter withdrawn into the cervical internal carotid artery. Final control angiography was then performed from the guide catheter. The guide catheter and guide sheath were then synchronously removed without incident. FINDINGS: Right internal carotid, head (pre embolization): Injection reveals the presence of a widely patent ICA, M1, and A1 segments and their branches. Again noted is the proximally 3.3 by 1.7 mm aneurysm. Angiogram taken today from the guide catheter in the supraclinoid internal carotid artery reveals the tip of the aneurysm to be  slightly more bulbous measuring approximately 1.9 mm in width. The parenchymal and venous phases are normal. The venous sinuses are widely patent. Right internal carotid artery, head (during embolization): Injection reveals the presence of a widely patent ICA that leads to a patent ACA and MCA. Coil mass within the aneurysm is stable, without coil prolapse or filling defect to suggest thrombus. There is progressive exclusion of the aneurysm with the 2 deployed coils. Right internal carotid artery, head (immediate post-embolization): Injection reveals the presence of a widely patent ICA that leads to a patent ACA and MCA. Coil mass within the aneurysm is stable, without coil prolapse or filling defect to suggest thrombus. There is miniscule aneurysm filling at the neck. No filling of the dome of the aneurysm is seen. Right internal carotid artery, head (final control): Injection reveals the presence of a widely patent ICA that leads to a patent ACA and MCA. No thrombus is visualized. Capillary phase does not demonstrate any perfusion deficits. Venous sinuses are patent and unremarkable. Right femoral: Normal vessel. No significant atherosclerotic disease. Arterial sheath in adequate position. DISPOSITION: Upon completion of the study, the femoral sheath was removed and hemostasis obtained using a 7-Fr ExoSeal closure device. Good proximal and  distal lower extremity pulses were documented upon achievement of hemostasis. The procedure was well tolerated and no early complications were observed. The patient was transferred to the postanesthesia care unit in stable hemodynamic condition. IMPRESSION: 1. 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 The preliminary results of this procedure were shared with the patient's family. Electronically Signed   By: Consuella Lose   On: 03/26/2021 14:15   US THYROID  Result Date: 03/20/2021 CLINICAL DATA:  Primary hyperparathyroidism EXAM: THYROID ULTRASOUND TECHNIQUE: Ultrasound examination of the thyroid gland and adjacent soft tissues was performed. COMPARISON:  None. FINDINGS: Parenchymal Echotexture: Moderately heterogenous Isthmus: 3 mm Right lobe: 2.7 x 2.0 x 1.7 cm Left lobe: 3.3 x 0.8 x 0.7 cm _________________________________________________________ Estimated total number of nodules >/= 1 cm: 1 Number of spongiform nodules >/=  2 cm not described below (TR1): 0 Number of mixed cystic and solid nodules >/= 1.5 cm not described below ([place]): 0 _________________________________________________________ Nodule # 1: Location: Right; Inferior Maximum size: 2.0 cm; Other 2 dimensions: 1.7 x 1.8 cm Composition: solid/almost completely solid (2) Echogenicity: hypoechoic (2) Shape: not taller-than-wide (0) Margins: ill-defined (0) Echogenic foci: none (0) ACR TI-RADS total points: 4. ACR TI-RADS risk category: TR4 (4-6 points). ACR TI-RADS recommendations: **Given size (>/= 1.5 cm) and appearance, fine needle aspiration of this moderately suspicious nodule should be considered based on TI-RADS criteria. _________________________________________________________ Overall, the thyroid gland appears atrophic. No hypervascularity. No regional adenopathy. IMPRESSION: 2 cm right inferior thyroid TR 4 nodule versus parathyroid nodule/adenoma (given the history of  primary hyperparathyroidism). Correlate with nuclear medicine parathyroid scan. The above is in keeping with the ACR TI-RADS recommendations - J Am Coll Radiol 2017;14:587-595. Electronically Signed   By: Jerilynn Mages.  Shick M.D.   On: 03/20/2021 07:42   VAS [place] TRANSCRANIAL DOPPLER  Result Date: 04/01/2021  Transcranial Doppler Patient Name:  TAI SYFERT  Date of Exam:   03/29/2021 Medical Rec #: [id number]         Accession #:    [id number] Date of Birth: 1966/02/21         Patient Gender: F Patient Age:   76 years Exam Location:  [facility] Procedure:      VAS [place] TRANSCRANIAL  DOPPLER Referring Phys: Consuella Lose --------------------------------------------------------------------------------  Indications: Subarachnoid hemorrhage. Limitations: Very difficult and limited exam due to patient continuously moving. Comparison Study: Previous exam 11/ Performing Technologist: Rogelia Rohrer RVT, RDMS  Examination Guidelines: A complete evaluation includes B-mode imaging, spectral Doppler, color Doppler, and power Doppler as needed of all accessible portions of each vessel. Bilateral testing is considered an integral part of a complete examination. Limited examinations for reoccurring indications may be performed as noted.  +----------+-------------+----------+-----------+-------+ RIGHT TCD Right VM (cm)Depth (cm)PulsatilityComment +----------+-------------+----------+-----------+-------+ MCA           65.00                 1.16            +----------+-------------+----------+-----------+-------+ ACA          -14.00                 1.28            +----------+-------------+----------+-----------+-------+ Term ICA      65.00                 1.11            +----------+-------------+----------+-----------+-------+ PCA           24.00                 1.07            +----------+-------------+----------+-----------+-------+ Opthalmic     23.00                 1.11             +----------+-------------+----------+-----------+-------+ ICA siphon    21.00                 1.40            +----------+-------------+----------+-----------+-------+ Vertebral    -37.00                 1.06            +----------+-------------+----------+-----------+-------+ Distal ICA    30.00                 1.18            +----------+-------------+----------+-----------+-------+  +----------+------------+----------+-----------+-------------+ LEFT TCD  Left VM (cm)Depth (cm)Pulsatility   Comment    +----------+------------+----------+-----------+-------------+ MCA          61.00                 0.98                  +----------+------------+----------+-----------+-------------+ ACA                                        not insonated +----------+------------+----------+-----------+-------------+ Term ICA                                   not insonated +----------+------------+----------+-----------+-------------+ PCA          30.00                 1.02                  +----------+------------+----------+-----------+-------------+ Opthalmic  not insonated +----------+------------+----------+-----------+-------------+ ICA siphon   18.00                 1.31                  +----------+------------+----------+-----------+-------------+ Vertebral    -24.00                1.28                  +----------+------------+----------+-----------+-------------+ Distal ICA   31.00                 1.16                  +----------+------------+----------+-----------+-------------+  +------------+-----+-------------+             VM cm   Comment    +------------+-----+-------------+ Prox Basilar     not insonated +------------+-----+-------------+ Dist Basilar     not insonated +------------+-----+-------------+ +----------------------+----+ Right Lindegaard Ratio2.17 +----------------------+----+  +---------------------+----+ Left Lindegaard Ratio1.97 +---------------------+----+  Summary:  Not all vessels could be insonated due to poor suboccipital and left orbital windows. Normal mean flow velocities in remaining identified vessels of anterior and posterior cerebral circulations. No evidence of vasospasm noted. *See table(s) above for TCD measurements and observations.  Diagnosing physician: Antony Contras MD Electronically signed by Antony Contras MD on 04/01/2021 at 8:34:28 AM.    Final    VAS [place] TRANSCRANIAL DOPPLER  Result Date: 03/28/2021  Transcranial Doppler Patient Name:  ADI SEALES  Date of Exam:   03/27/2021 Medical Rec #: [id number]         Accession #:    [id number] Date of Birth: 1966-04-01         Patient Gender: F Patient Age:   50 years Exam Location:  [facility] Procedure:      VAS [place] TRANSCRANIAL DOPPLER Referring Phys: Consuella Lose --------------------------------------------------------------------------------  Indications: Subarachnoid hemorrhage. Limitations: Today's examination was limited by patient inability to              cooperate/remain stationary. Comparison Study: 03-25-2021 Most recent prior study. Performing Technologist: Darlin Coco RDMS, RVT  Examination Guidelines: A complete evaluation includes B-mode imaging, spectral Doppler, color Doppler, and power Doppler as needed of all accessible portions of each vessel. Bilateral testing is considered an integral part of a complete examination. Limited examinations for reoccurring indications may be performed as noted.  +----------+-------------+----------+-----------+-------+ RIGHT TCD Right VM (cm)Depth (cm)PulsatilityComment +----------+-------------+----------+-----------+-------+ MCA           86.00                 1.15            +----------+-------------+----------+-----------+-------+ ACA          -34.00                 1.14             +----------+-------------+----------+-----------+-------+ Term ICA      64.00                 1.32            +----------+-------------+----------+-----------+-------+ PCA           28.00                 1.07            +----------+-------------+----------+-----------+-------+ Opthalmic     30.00  1.29            +----------+-------------+----------+-----------+-------+ ICA siphon    23.00                 1.37            +----------+-------------+----------+-----------+-------+ Vertebral    -51.00                 1.19            +----------+-------------+----------+-----------+-------+ Distal ICA    22.00                 1.12            +----------+-------------+----------+-----------+-------+  +----------+------------+----------+-----------+------------------+ LEFT TCD  Left VM (cm)Depth (cm)Pulsatility     Comment       +----------+------------+----------+-----------+------------------+ MCA          52.00                 1.39                       +----------+------------+----------+-----------+------------------+ ACA                                        Unable to insonate +----------+------------+----------+-----------+------------------+ Term ICA                                   Unable to insonate +----------+------------+----------+-----------+------------------+ PCA          42.00                 1.01                       +----------+------------+----------+-----------+------------------+ Opthalmic    24.00                 1.65                       +----------+------------+----------+-----------+------------------+ ICA siphon   32.00                 1.44                       +----------+------------+----------+-----------+------------------+ Vertebral    -24.00                1.37                       +----------+------------+----------+-----------+------------------+ Distal ICA   28.00                  1.00                       +----------+------------+----------+-----------+------------------+  +------------+-----+------------------+             VM cm     Comment       +------------+-----+------------------+ Prox Basilar     Unable to insonate +------------+-----+------------------+ +----------------------+----+ Right Lindegaard Ratio3.91 +----------------------+----+ +---------------------+----+ Left Lindegaard Ratio1.86 +---------------------+----+  Summary:  Mildly elevated right middle cerebral aretry mean flow velocities of unclear significance. Normal mean flow velocities in remaining identified vessles of anetrior and posterior circulations.No definite veidence of vasospasm noted. *See table(s) above for TCD measurements and observations.  Diagnosing physician: Antony Contras MD Electronically  signed by Antony Contras MD on 03/28/2021 at 2:41:34 PM.    Final    VAS [place] TRANSCRANIAL DOPPLER  Result Date: 03/25/2021  Transcranial Doppler Patient Name:  CORRI DELAPAZ  Date of Exam:   03/25/2021 Medical Rec #: [id number]         Accession #:    [id number] Date of Birth: [birth year]         Patient Gender: F Patient Age:   14 years Exam Location:  [facility] Procedure:      VAS [place] TRANSCRANIAL DOPPLER Referring Phys: Consuella Lose --------------------------------------------------------------------------------  Indications: Subarachnoid hemorrhage. Limitations: Patient movement Limitations for diagnostic windows: Unable to insonate right transtemporal window. Comparison Study: Prior study done 03/20/21 Performing Technologist: Sharion Dove RVS  Examination Guidelines: A complete evaluation includes B-mode imaging, spectral Doppler, color Doppler, and power Doppler as needed of all accessible portions of each vessel. Bilateral testing is considered an integral part of a complete examination. Limited examinations for reoccurring indications may be performed as noted.   +----------+-------------+----------+-----------+------------------+ RIGHT TCD Right VM (cm)Depth (cm)Pulsatility     Comment       +----------+-------------+----------+-----------+------------------+ MCA                                         unable to insonate +----------+-------------+----------+-----------+------------------+ ACA                                         unable to insonate +----------+-------------+----------+-----------+------------------+ Term ICA                                    unable to insonate +----------+-------------+----------+-----------+------------------+ PCA                                         unable to insonate +----------+-------------+----------+-----------+------------------+ Opthalmic     28.00                 1.52                       +----------+-------------+----------+-----------+------------------+ ICA siphon                                  unable to insonate +----------+-------------+----------+-----------+------------------+ Vertebral     24.00                 1.40                       +----------+-------------+----------+-----------+------------------+  +----------+------------+----------+-----------+------------------+ LEFT TCD  Left VM (cm)Depth (cm)Pulsatility     Comment       +----------+------------+----------+-----------+------------------+ MCA                                        unable to insonate +----------+------------+----------+-----------+------------------+ ACA  unable to insonate +----------+------------+----------+-----------+------------------+ Term ICA     27.00                 1.18                       +----------+------------+----------+-----------+------------------+ PCA          29.00                 1.62                       +----------+------------+----------+-----------+------------------+ Opthalmic                                   unable to insonate +----------+------------+----------+-----------+------------------+ ICA siphon                                 unable to insonate +----------+------------+----------+-----------+------------------+ Vertebral    -54.00                                           +----------+------------+----------+-----------+------------------+  +------------+------+-------+             VM cm Comment +------------+------+-------+ Prox Basilar-55.70        +------------+------+-------+ Summary:  Absent bitemporal windows due to patient movement limits evaluation. Normal mean flow velocities in remeining identified vessels of anterior and psoterior cerebral circulation *See table(s) above for TCD measurements and observations.  Diagnosing physician: Antony Contras MD Electronically signed by Antony Contras MD on 03/25/2021 at 2:26:59 PM.    Final    VAS [place] TRANSCRANIAL DOPPLER  Result Date: 03/25/2021  Transcranial Doppler Patient Name:  JYASIA MARKOFF  Date of Exam:   03/22/2021 Medical Rec #: [id number]         Accession #:    [id number] Date of Birth: 16-Oct-1965         Patient Gender: F Patient Age:   69 years Exam Location:  [facility] Procedure:      VAS [place] TRANSCRANIAL DOPPLER Referring Phys: Consuella Lose --------------------------------------------------------------------------------  Indications: Subarachnoid hemorrhage. Limitations: Patient unable to cooperate for full exam Comparison Study: 03/20/21 prior Performing Technologist: Archie Patten RVS  Examination Guidelines: A complete evaluation includes B-mode imaging, spectral Doppler, color Doppler, and power Doppler as needed of all accessible portions of each vessel. Bilateral testing is considered an integral part of a complete examination. Limited examinations for reoccurring indications may be performed as noted.  +----------+-------------+----------+-----------+------------------------------+  RIGHT TCD Right VM (cm)Depth (cm)Pulsatility           Comment             +----------+-------------+----------+-----------+------------------------------+ MCA          103.00                 0.87                                   +----------+-------------+----------+-----------+------------------------------+ Term ICA      73.00                 1.18                                   +----------+-------------+----------+-----------+------------------------------+  PCA           24.00                 0.94                                   +----------+-------------+----------+-----------+------------------------------+ Opthalmic     29.00                 1.41                                   +----------+-------------+----------+-----------+------------------------------+ ICA siphon    44.00                 1.14                                   +----------+-------------+----------+-----------+------------------------------+ Vertebral                                    patient unable to cooperate                                                   for remainder of exam      +----------+-------------+----------+-----------+------------------------------+ Distal ICA    40.00                 1.48                                   +----------+-------------+----------+-----------+------------------------------+  +----------+------------+----------+-----------+-------------------------------+ LEFT TCD  Left VM (cm)Depth (cm)Pulsatility            Comment             +----------+------------+----------+-----------+-------------------------------+ MCA          22.00                 1.27                                    +----------+------------+----------+-----------+-------------------------------+ ACA          -28.00                1.27                                    +----------+------------+----------+-----------+-------------------------------+  Term ICA                                   patient unable to cooperate for                                                   remainder of exam        +----------+------------+----------+-----------+-------------------------------+  PCA                                        patient unable to cooperate for                                                   remainder of exam        +----------+------------+----------+-----------+-------------------------------+ Opthalmic    28.00                 1.33                                    +----------+------------+----------+-----------+-------------------------------+ ICA siphon                                       unable to insonate        +----------+------------+----------+-----------+-------------------------------+ Vertebral                                  patient unable to cooperate for                                                   remainder of exam        +----------+------------+----------+-----------+-------------------------------+ Distal ICA                                 patient unable to cooperate for                                                   remainder of exam        +----------+------------+----------+-----------+-------------------------------+  +------------+-----+-------------------------------------------------+             VM cm                     Comment                      +------------+-----+-------------------------------------------------+ Prox Basilar     patient unable to cooperate for remainder of exam +------------+-----+-------------------------------------------------+ Dist Basilar     patient unable to cooperate for remainder of exam +------------+-----+-------------------------------------------------+ Summary:  Patient uncooperative for full exam and evaluation of left temporal and occipital windows is limited. Elevated mean flow velocity in right middle  cerebral arery suggests mild vasospasm,. *See table(s) above for TCD measurements and observations.  Diagnosing physician: Antony Contras MD Electronically signed by Antony Contras MD on 03/25/2021 at 2:25:40 PM.    Final    VAS [place] TRANSCRANIAL DOPPLER  Result Date: 03/20/2021  Transcranial Doppler Patient Name:  NATALIA WITTMEYER  Date of Exam:   03/20/2021 Medical Rec #: [id number]  Accession #:    [id number] Date of Birth: 1966-02-07         Patient Gender: F Patient Age:   70 years Exam Location:  [facility] Procedure:      VAS [place] TRANSCRANIAL DOPPLER Referring Phys: Consuella Lose --------------------------------------------------------------------------------  Indications: Subarachnoid hemorrhage. Limitations for diagnostic windows: Poor insonation of left transtemporal window. Comparison Study: 03-18-2021 Most recent TCD Performing Technologist: Darlin Coco RDMS, RVT  Examination Guidelines: A complete evaluation includes B-mode imaging, spectral Doppler, color Doppler, and power Doppler as needed of all accessible portions of each vessel. Bilateral testing is considered an integral part of a complete examination. Limited examinations for reoccurring indications may be performed as noted.  +----------+-------------+----------+-----------+-------+ RIGHT TCD Right VM (cm)Depth (cm)PulsatilityComment +----------+-------------+----------+-----------+-------+ MCA           91.00                 1.07            +----------+-------------+----------+-----------+-------+ ACA          -30.00                 1.08            +----------+-------------+----------+-----------+-------+ Term ICA      36.00                 1.65            +----------+-------------+----------+-----------+-------+ PCA           38.00                 0.87            +----------+-------------+----------+-----------+-------+ Opthalmic     27.00                 1.56             +----------+-------------+----------+-----------+-------+ ICA siphon    51.00                 1.08            +----------+-------------+----------+-----------+-------+ Vertebral    -63.00                 0.88            +----------+-------------+----------+-----------+-------+ Distal ICA    32.00                 0.85            +----------+-------------+----------+-----------+-------+  +----------+------------+----------+-----------+-------------------------------+ LEFT TCD  Left VM (cm)Depth (cm)Pulsatility            Comment             +----------+------------+----------+-----------+-------------------------------+ MCA          56.00                 1.26        Obtained from the right                                                        temporal window         +----------+------------+----------+-----------+-------------------------------+ ACA  Unable to insonate        +----------+------------+----------+-----------+-------------------------------+ Term ICA                                         Unable to insonate        +----------+------------+----------+-----------+-------------------------------+ PCA          43.00                 1.02                                    +----------+------------+----------+-----------+-------------------------------+ Opthalmic    22.00                 1.36                                    +----------+------------+----------+-----------+-------------------------------+ ICA siphon                                       Unable to insonate        +----------+------------+----------+-----------+-------------------------------+ Vertebral    -29.00                0.90                                    +----------+------------+----------+-----------+-------------------------------+ Distal ICA   34.00                 1.05                                     +----------+------------+----------+-----------+-------------------------------+  +------------+------+------------------+             VM cm      Comment       +------------+------+------------------+ Prox Basilar-45.00                   +------------+------+------------------+ Dist Basilar      Unable to insonate +------------+------+------------------+ +----------------------+----+ Right Lindegaard Ratio2.84 +----------------------+----+ +---------------------+----+ Left Lindegaard Ratio1.65 +---------------------+----+  Summary:  Poor left temporal and suboccipital windows limit exam. Mildly elevated right middle cerebral artery mean flow velocities of unclear significance.Normal mean flow velocities inremaining identified vessels of anterior and posterior cerebral circulations. No definite evidence of vasospasm noted. *See table(s) above for TCD measurements and observations.  Diagnosing physician: Antony Contras MD Electronically signed by Antony Contras MD on 03/20/2021 at 5:47:05 PM.    Final    VAS [place] TRANSCRANIAL DOPPLER  Result Date: 03/18/2021  Transcranial Doppler Patient Name:  SHAWNETTA LEIN  Date of Exam:   03/18/2021 Medical Rec #: [id number]         Accession #:    [id number] Date of Birth: 10-24-1965         Patient Gender: F Patient Age:   42 years Exam Location:  [facility] Procedure:      VAS [place] TRANSCRANIAL DOPPLER Referring Phys: Consuella Lose --------------------------------------------------------------------------------  Indications: Subarachnoid hemorrhage. Limitations: Poor insonation of left transtemporal window. Eye movement while  examining transorbital windows. Comparison Study: Previous exam 03/16/2021 Performing Technologist: Rogelia Rohrer RVT, RDMS  Examination Guidelines: A complete evaluation includes B-mode imaging, spectral Doppler, color Doppler, and power Doppler as needed of all accessible portions of each vessel. Bilateral testing is  considered an integral part of a complete examination. Limited examinations for reoccurring indications may be performed as noted.  +----------+-------------+----------+-----------+-------------+ RIGHT TCD Right VM (cm)Depth (cm)Pulsatility   Comment    +----------+-------------+----------+-----------+-------------+ MCA           64.00                 1.05                  +----------+-------------+----------+-----------+-------------+ ACA          -18.00                 0.97                  +----------+-------------+----------+-----------+-------------+ Term ICA      36.00                 0.97                  +----------+-------------+----------+-----------+-------------+ PCA           18.00                 1.34                  +----------+-------------+----------+-----------+-------------+ Opthalmic                                   not insonated +----------+-------------+----------+-----------+-------------+ ICA siphon    20.00                 1.51                  +----------+-------------+----------+-----------+-------------+ Vertebral    -30.00                 1.16                  +----------+-------------+----------+-----------+-------------+ Distal ICA    34.00                 1.03                  +----------+-------------+----------+-----------+-------------+  +----------+------------+----------+-----------+-------------+ LEFT TCD  Left VM (cm)Depth (cm)Pulsatility   Comment    +----------+------------+----------+-----------+-------------+ MCA                                        not insonated +----------+------------+----------+-----------+-------------+ ACA                                        not insonated +----------+------------+----------+-----------+-------------+ Term ICA                                   not insonated +----------+------------+----------+-----------+-------------+ PCA          29.00                  0.87                  +----------+------------+----------+-----------+-------------+  Opthalmic                                  not insonated +----------+------------+----------+-----------+-------------+ ICA siphon   16.00                 1.27                  +----------+------------+----------+-----------+-------------+ Vertebral    -39.00                0.92                  +----------+------------+----------+-----------+-------------+ Distal ICA   27.00                 1.05                  +----------+------------+----------+-----------+-------------+  +------------+------+-------+             VM cm Comment +------------+------+-------+ Prox Basilar-45.00        +------------+------+-------+ Dist Basilar-57.00        +------------+------+-------+ +----------------------+----+ Right Lindegaard Ratio1.88 +----------------------+----+  Summary: This was a normal transcranial Doppler study, with normal flow direction and velocity of all identified vessels of the anterior and posterior circulations, with no evidence of stenosis, vasospasm or occlusion. There was no evidence of intracranial disease. however, lack of left transtemproal window made it difficult to insonate left MCA ACA. Pulsatility index improved from 2 days ago, indicating improved intracranial pressure. clinical correlation is recommended. *See table(s) above for TCD measurements and observations.  Diagnosing physician: Rosalin Hawking MD Electronically signed by Rosalin Hawking MD on 03/18/2021 at 6:48:55 PM.    Final    VAS [place] TRANSCRANIAL DOPPLER  Result Date: 03/16/2021  Transcranial Doppler Patient Name:  CHELLSIE GOMER  Date of Exam:   03/16/2021 Medical Rec #: [id number]         Accession #:    [id number] Date of Birth: 15-Jan-1966         Patient Gender: F Patient Age:   12 years Exam Location:  [facility] Procedure:      VAS [place] TRANSCRANIAL DOPPLER Referring Phys: Consuella Lose  --------------------------------------------------------------------------------  Indications: Subarachnoid hemorrhage. Limitations: Poor insonation of left transtemporal window. Comparison Study: No prior studies. Performing Technologist: Darlin Coco RDMS, RVT  Examination Guidelines: A complete evaluation includes B-mode imaging, spectral Doppler, color Doppler, and power Doppler as needed of all accessible portions of each vessel. Bilateral testing is considered an integral part of a complete examination. Limited examinations for reoccurring indications may be performed as noted.  +----------+-------------+----------+-----------+-------+ RIGHT TCD Right VM (cm)Depth (cm)PulsatilityComment +----------+-------------+----------+-----------+-------+ MCA           58.00                 1.49            +----------+-------------+----------+-----------+-------+ ACA          -17.00                 0.48            +----------+-------------+----------+-----------+-------+ Term ICA      32.00                 1.42            +----------+-------------+----------+-----------+-------+ PCA           31.00  1.20            +----------+-------------+----------+-----------+-------+ Opthalmic     19.00                 1.47            +----------+-------------+----------+-----------+-------+ ICA siphon    20.00                 1.57            +----------+-------------+----------+-----------+-------+ Vertebral    -28.00                  126            +----------+-------------+----------+-----------+-------+ Distal ICA    18.00                 1.26            +----------+-------------+----------+-----------+-------+  +----------+------------+----------+-----------+-----------------------------+ LEFT TCD  Left VM (cm)Depth (cm)Pulsatility           Comment            +----------+------------+----------+-----------+-----------------------------+ MCA           35.00                 1.51    Insonated from right temporal +----------+------------+----------+-----------+-----------------------------+ ACA                                             Unable to insonate       +----------+------------+----------+-----------+-----------------------------+ Term ICA                                        Unable to insonate       +----------+------------+----------+-----------+-----------------------------+ PCA          17.00                 1.24                                  +----------+------------+----------+-----------+-----------------------------+ Opthalmic    12.00                  .90                                  +----------+------------+----------+-----------+-----------------------------+ ICA siphon   16.00                 1.91                                  +----------+------------+----------+-----------+-----------------------------+ Vertebral    -35.00                1.42                                  +----------+------------+----------+-----------+-----------------------------+ Distal ICA   22.00                 1.46                                  +----------+------------+----------+-----------+-----------------------------+  +------------+------+-------+  VM cm Comment +------------+------+-------+ Prox Basilar-42.00        +------------+------+-------+ Dist Basilar-33.00        +------------+------+-------+ +----------------------+----+ Right Lindegaard Ratio3.22 +----------------------+----+ +---------------------+----+ Left Lindegaard Ratio1.59 +---------------------+----+  Summary: This was a normal transcranial Doppler study, with normal flow direction and velocity of all identified vessels of the anterior and posterior circulations, with no evidence of stenosis, vasospasm or occlusion. There was no evidence of intracranial disease. however, poor left transtemproal window  likely underestimated the MFVs of left PCA and left ICA siphon. globally elevated pulsatility index indicates elevated intracranial pressure. clinical correlation is recommended. *See table(s) above for TCD measurements and observations.  Diagnosing physician: Rosalin Hawking MD Electronically signed by Rosalin Hawking MD on 03/16/2021 at 6:14:53 PM.    Final     Subjective: Eager to go home today  Discharge Exam: Vitals:   04/08/21 0619 04/08/21 1346  BP: (!) 158/80 (!) 155/82  Pulse: 70 67  Resp: 15 19  Temp: 97.7 F (36.5 C) 98.4 F (36.9 C)  SpO2: 98% 99%   Vitals:   04/07/21 1329 04/07/21 2028 04/08/21 0619 04/08/21 1346  BP: (!) 142/99 140/83 (!) 158/80 (!) 155/82  Pulse: 79 75 70 67  Resp: 19 16 15 19   Temp: 98.6 F (37 C) (!) 97.4 F (36.3 C) 97.7 F (36.5 C) 98.4 F (36.9 C)  TempSrc:  Oral Oral   SpO2: 100% 99% 98% 99%  Weight:        General: Pt is alert, awake, not in acute distress Cardiovascular: RRR, S1/S2 + Respiratory: CTA bilaterally, no wheezing, no rhonchi Abdominal: Soft, NT, ND, bowel sounds + Extremities: no edema, no cyanosis   The results of significant diagnostics from this hospitalization (including imaging, microbiology, ancillary and laboratory) are listed below for reference.     Microbiology: Recent Results (from the past 240 hour(s))  SARS Coronavirus 2 by RT PCR (hospital order, performed in [facility] hospital lab) Nasopharyngeal Nasopharyngeal Swab     Status: None   Collection Time: 04/05/21  8:11 AM   Specimen: Nasopharyngeal Swab  Result Value Ref Range Status   SARS Coronavirus 2 NEGATIVE NEGATIVE Final    Comment: (NOTE) SARS-CoV-2 target nucleic acids are NOT DETECTED.  The SARS-CoV-2 RNA is generally detectable in upper and lower respiratory specimens during the acute phase of infection. The lowest concentration of SARS-CoV-2 viral copies this assay can detect is 250 copies / mL. A negative result does not preclude SARS-CoV-2  infection and should not be used as the sole basis for treatment or other patient management decisions.  A negative result may occur with improper specimen collection / handling, submission of specimen other than nasopharyngeal swab, presence of viral mutation(s) within the areas targeted by this assay, and inadequate number of viral copies (<250 copies / mL). A negative result must be combined with clinical observations, patient history, and epidemiological information.  Fact Sheet for Patients:   StrictlyIdeas.no  Fact Sheet for Healthcare Providers: BankingDealers.co.za  This test is not yet approved or  cleared by the [place] FDA and has been authorized for detection and/or diagnosis of SARS-CoV-2 by FDA under an Emergency Use Authorization (EUA).  This EUA will remain in effect (meaning this test can be used) for the duration of the COVID-19 declaration under Section 564(b)(1) of the Act, 21 U.S.C. section 360bbb-3(b)(1), unless the authorization is terminated or revoked sooner.  Performed at [facility], [place] [address] [place], [place] [address]  Labs: BNP (last 3 results) No results for input(s): BNP in the last 8760 hours. Basic Metabolic Panel: Recent Labs  Lab 04/03/21 1208 04/03/21 1230 04/05/21 0738 04/06/21 0525 04/06/21 2316 04/07/21 0534 04/08/21 0508  NA  --    < > 136 135 136 139 139  K  --    < > 3.3* 3.0* 3.3* 3.2* 3.4*  CL  --    < > 109 105 109 112* 111  CO2  --    < > 21* 24 22 21* 22  GLUCOSE  --    < > 146* 140* 188* 129* 137*  BUN  --    < > <5* 10 14 11 11   CREATININE  --    < > 0.64 0.90 0.94 0.79 0.73  CALCIUM  --    < > 12.3* 10.7* 11.1* 10.3 9.7  MG 2.0  --   --   --   --   --  1.7  PHOS 1.7*  --  <1.0*  --   --  1.8*  --    < > = values in this interval not displayed.   Liver Function Tests: Recent Labs  Lab 04/03/21 1056 04/06/21 2316 04/07/21 0534 04/08/21 0508   AST 14* 15 14* 14*  ALT 14 13 11 11   ALKPHOS 118 109 97 108  BILITOT 0.5 0.3 0.4 0.6  PROT 7.5 6.5 6.2* 6.4*  ALBUMIN 3.5 3.4* 3.3* 3.3*   No results for input(s): LIPASE, AMYLASE in the last 168 hours. No results for input(s): AMMONIA in the last 168 hours. CBC: Recent Labs  Lab 04/03/21 1056 04/06/21 2316  WBC 6.2 8.1  NEUTROABS 3.0 4.5  HGB 12.8 11.0*  HCT 38.8 33.2*  MCV 89.0 88.3  PLT 379 263   Cardiac Enzymes: No results for input(s): CKTOTAL, CKMB, CKMBINDEX, TROPONINI in the last 168 hours. BNP: Invalid input(s): POCBNP CBG: Recent Labs  Lab 04/06/21 2322  GLUCAP 165*   D-Dimer No results for input(s): DDIMER in the last 72 hours. Hgb A1c No results for input(s): HGBA1C in the last 72 hours. Lipid Profile No results for input(s): CHOL, HDL, LDLCALC, TRIG, CHOLHDL, LDLDIRECT in the last 72 hours. Thyroid function studies No results for input(s): TSH, T4TOTAL, T3FREE, THYROIDAB in the last 72 hours.  Invalid input(s): FREET3 Anemia work up No results for input(s): VITAMINB12, FOLATE, FERRITIN, TIBC, IRON, RETICCTPCT in the last 72 hours. Urinalysis    Component Value Date/Time   COLORURINE RED (A) 08/28/2016 0550   APPEARANCEUR HAZY (A) 08/28/2016 0550   LABSPEC 1.006 08/28/2016 0550   PHURINE 5.0 08/28/2016 0550   GLUCOSEU NEGATIVE 08/28/2016 0550   HGBUR LARGE (A) 08/28/2016 0550   BILIRUBINUR negative 05/31/2019 1649   KETONESUR NEGATIVE 08/28/2016 0550   PROTEINUR Negative 05/31/2019 1649   PROTEINUR NEGATIVE 08/28/2016 0550   UROBILINOGEN 0.2 05/31/2019 1649   NITRITE negative 05/31/2019 1649   NITRITE NEGATIVE 08/28/2016 0550   LEUKOCYTESUR Negative 05/31/2019 1649   Sepsis Labs Invalid input(s): PROCALCITONIN,  WBC,  LACTICIDVEN Microbiology Recent Results (from the past 240 hour(s))  SARS Coronavirus 2 by RT PCR (hospital order, performed in [place] hospital lab) Nasopharyngeal Nasopharyngeal Swab     Status: None   Collection  Time: 04/05/21  8:11 AM   Specimen: Nasopharyngeal Swab  Result Value Ref Range Status   SARS Coronavirus 2 NEGATIVE NEGATIVE Final    Comment: (NOTE) SARS-CoV-2 target nucleic acids are NOT DETECTED.  The SARS-CoV-2 RNA is generally detectable  in upper and lower respiratory specimens during the acute phase of infection. The lowest concentration of SARS-CoV-2 viral copies this assay can detect is 250 copies / mL. A negative result does not preclude SARS-CoV-2 infection and should not be used as the sole basis for treatment or other patient management decisions.  A negative result may occur with improper specimen collection / handling, submission of specimen other than nasopharyngeal swab, presence of viral mutation(s) within the areas targeted by this assay, and inadequate number of viral copies (<250 copies / mL). A negative result must be combined with clinical observations, patient history, and epidemiological information.  Fact Sheet for Patients:   StrictlyIdeas.no  Fact Sheet for Healthcare Providers: BankingDealers.co.za  This test is not yet approved or  cleared by the [place] FDA and has been authorized for detection and/or diagnosis of SARS-CoV-2 by FDA under an Emergency Use Authorization (EUA).  This EUA will remain in effect (meaning this test can be used) for the duration of the COVID-19 declaration under Section 564(b)(1) of the Act, 21 U.S.C. section 360bbb-3(b)(1), unless the authorization is terminated or revoked sooner.  Performed at [facility], [place] [address], [place], [place] [address]    Time spent: 30 min  SIGNED:   Marylu Lund, MD  [facility] 04/08/2021, 1:50 PM  If 7PM-7AM, please contact night-coverage

## 2021-04-09 ENCOUNTER — Encounter: Payer: Self-pay | Admitting: Internal Medicine

## 2021-04-09 ENCOUNTER — Ambulatory Visit (INDEPENDENT_AMBULATORY_CARE_PROVIDER_SITE_OTHER): Payer: BC Managed Care – PPO | Admitting: Internal Medicine

## 2021-04-09 ENCOUNTER — Other Ambulatory Visit: Payer: Self-pay

## 2021-04-09 VITALS — BP 132/86 | HR 78 | Ht 68.0 in | Wt 177.4 lb

## 2021-04-09 DIAGNOSIS — E039 Hypothyroidism, unspecified: Secondary | ICD-10-CM

## 2021-04-09 DIAGNOSIS — E559 Vitamin D deficiency, unspecified: Secondary | ICD-10-CM

## 2021-04-09 DIAGNOSIS — E213 Hyperparathyroidism, unspecified: Secondary | ICD-10-CM | POA: Diagnosis not present

## 2021-04-09 NOTE — Patient Instructions (Addendum)
Please continue: - Levothyroxine 150 mcg daily.  Take the thyroid hormone every day, with water, at least 30 minutes before breakfast, separated by at least 4 hours from: - acid reflux medications - calcium - iron - multivitamins  Move calcium to lunchtime or later.  Hold vitamin  for the next 2 weeks, then take 1 capsule weekly.  Please come back in 3 months.

## 2021-04-09 NOTE — Progress Notes (Addendum)
Patient ID: Amanda Henry, female   DOB: 01/29/1966, 55 y.o.   MRN: [id number]   This visit occurred during the SARS-CoV-2 public health emergency.  Safety protocols were in place, including screening questions prior to the visit, additional usage of staff PPE, and extensive cleaning of exam room while observing appropriate contact time as indicated for disinfecting solutions.   HPI  Amanda Henry is a 55 y.o.-year-old female, initially referred referred by her PCP, [name], FNP, returning for f/u for hypercalcemia/hyperparathyroidism and also hypothyroidism. Last OV 5 mo ago.  Interim history: She returned after being discharged from the hospital yesterday. She describes being very tired, having a whooshing sound in her head, tremor, fell out of bed  - lost consciousness, had urine loss and headache  - taken to the hospital >> found to have high blood pressure and a subarachnoid hemorrhage - this was coiled. In the hospital, calcium was >15 >> parathyroid adenoma resected >> not indicative of malignancy As of now, she feels well after the surgery, with only occasional tingling throughout her body but not consistently.  Pt was dx with hypercalcemia in 2018 and hyperparathyroidism in 05/2019. I reviewed pt's pertinent labs: Lab Results  Component Value Date   PTH 311 (H) 04/03/2021   PTH Comment 04/03/2021   PTH 163 (H) 11/28/2020   PTH 163 (H) 06/14/2019   CALCIUM 9.7 04/08/2021   CALCIUM 10.3 04/07/2021   CALCIUM 11.1 (H) 04/06/2021   CALCIUM 10.7 (H) 04/06/2021   CALCIUM 12.3 (H) 04/05/2021   CALCIUM 13.9 (HH) 04/04/2021   CALCIUM >15.0 (HH) 04/03/2021   CALCIUM 15.8 (HH) 04/03/2021   CALCIUM >15.0 (HH) 04/03/2021   CALCIUM >15.0 (HH) 04/01/2021   Component     Latest Ref Rng & Units 11/28/2020  Calcium     8.6 - 10.4 mg/dL 14.4 (HH)  Vitamin D 1, 25 (OH) Total     18 - 72 pg/mL 127 (H)  Vitamin D3 1, 25 (OH)     pg/mL 19  Vitamin D2 1, 25 (OH)     pg/mL 108   Vitamin D, 25-Hydroxy     30.0 - 100.0 ng/mL 32.6  PTH, Intact     15 - 65 pg/mL 163 (H)  Phosphorus     2.3 - 4.6 mg/dL 1.6 (L)  Magnesium     1.5 - 2.5 mg/dL 2.0   Component     Latest Ref Rng & Units 12/03/2020  Creatinine, 24H Ur     0.50 - 2.15 g/24 h 1.43  Calcium, 24H Urine     mg/24 h 390 (H)  FECa 0.216%  04/04/2021: Technetium sestamibi scan: There is a focus of persistent/delayed uptake in the lower right thyroid lobe consistent with a parathyroid adenoma. This appears to correlate with the CT and ultrasound findings of a 2 cm nodule in this area.  She had right inferior parathyroidectomy by Dr. Harlow Asa on 04/06/2021: A. PARATHYROID, RIGHT INFERIOR, ADENOMA, PARATHYROIDECTOMY:  - Parathyroid adenoma, 4.36 g.  See comment  - Scant thyroid tissue with lymphocytic thyroiditis - no evidence of  malignancy   The parathyroid adenoma shows a few thick fibrous bands which is one of  the features of an atypical adenoma but it does not show other atypical  features such as necrosis, nuclear atypia or increased mitotic activity.  Dr. Saralyn Pilar reviewed the case and concurs with the diagnosis.  Clinical  correlation is suggested   No h/o osteoporosis.  No DXA scans available for review. No fractures  or falls.  No h/o kidney stones.  No h/o CKD. Last BUN/Cr: Lab Results  Component Value Date   BUN 11 04/08/2021   BUN 11 04/07/2021   CREATININE 0.73 04/08/2021   CREATININE 0.79 04/07/2021   Of note, her alkaline phosphatase was elevated once, but at that time she also had a low vitamin D: Lab Results  Component Value Date   ALKPHOS 108 04/08/2021   ALKPHOS 97 04/07/2021   ALKPHOS 109 04/06/2021   ALKPHOS 118 04/03/2021   ALKPHOS 186 (H) 05/31/2019   ALKPHOS 79 09/10/2016   ALKPHOS 66 08/28/2016   ALKPHOS 76 05/28/2016   ALKPHOS 120 04/21/2016   ALKPHOS 78 02/14/2016   SPEP was normal: Component     Latest Ref Rng & Units 05/31/2019 06/14/2019  Total Protein      6.0 - 8.5 g/dL 7.3 7.3  Albumin ELP     2.9 - 4.4 g/dL 4.0 3.9  Alpha 1     0.0 - 0.4 g/dL 0.2 0.2  Alpha 2     0.4 - 1.0 g/dL 0.6 0.7  Beta     0.7 - 1.3 g/dL 1.0 1.1  Gamma Globulin     0.4 - 1.8 g/dL 1.5 1.5  M-SPIKE, %     Not Observed g/dL Not Observed Not Observed  Globulin, Total     2.2 - 3.9 g/dL 3.3 3.4  A/G Ratio     0.7 - 1.7 1.2 1.1   Pt is not on HCTZ.  Vitamin D deficiency:  Reviewed vit D levels: Lab Results  Component Value Date   VD25OH 113.24 (H) 04/03/2021   VD25OH 32.6 11/28/2020   VD25OH 24.6 (L) 10/19/2020   VD25OH 17.2 (L) 09/25/2020   VD25OH 28.3 (L) 02/14/2020   VD25OH 23.3 (L) 11/28/2019   VD25OH 12.4 (L) 05/31/2019   Pt is on ergocalciferol 50,000 units twice a week but she was missing many doses in the past.  Now taking it more consistently.  Pt does not have a FH of hypercalcemia, pituitary tumors, thyroid cancer, or osteoporosis.   Hypothyroidism:  - s/p RAI tx for Graves 1991  She continues on levothyroxine 150 mcg daily (decreased from 175 mg daily in 01/2020): - in am - fasting - at least 30 min from b'fast - + Ca 200 mg 2x a day - just started - first dose in am - no Fe - off MVI - no PPIs  - stopped Biotin  Reviewed her TFTs (she did not return for labs 1.5 months after the change in dose): Lab Results  Component Value Date   TSH 0.403 04/04/2021   TSH 2.090 10/19/2020   TSH 14.800 (H) 09/25/2020   TSH 0.33 (L) 02/14/2020   TSH 0.219 (L) 11/28/2019   TSH 0.622 05/31/2019   TSH 1.900 10/21/2018   TSH 6.510 (H) 09/07/2018   TSH 25.010 (H) 06/03/2018   Lab Results  Component Value Date   FREET4 1.38 10/19/2020   FREET4 1.12 02/14/2020   FREET4 1.17 09/07/2018   FREET4 0.41 (L) 06/03/2018   Lab Results  Component Value Date   T3FREE 1.4 (L) 09/25/2020   T3FREE 2.7 11/28/2019   T3FREE 2.7 05/31/2019   T3FREE 2.6 10/21/2018   Pt denies: - feeling nodules in neck - hoarseness - dysphagia - choking - SOB  with lying down  She has family history of thyroid disease in M and M uncles.  No Fh of thyroid cancer. + h/o radiation tx to head or  neck - in 1991 (RAI for Graves).  No herbal supplements. No recent steroids use.   ROS: + See HPI  I reviewed pt's medications, allergies, PMH, social hx, family hx, and changes were documented in the history of present illness. Otherwise, unchanged from my initial visit note.  Past Medical History:  Diagnosis Date   Anxiety    Blood transfusion without reported diagnosis    Depression    Hypertension    no medications at this time   Hypothyroidism    Iron deficiency anemia    Thyroid disease    Past Surgical History:  Procedure Laterality Date   IR ANGIO INTRA EXTRACRAN SEL COM CAROTID INNOMINATE UNI L MOD SED  03/16/2021   IR ANGIO INTRA EXTRACRAN SEL COM CAROTID INNOMINATE UNI L MOD SED  03/26/2021   IR ANGIO INTRA EXTRACRAN SEL INTERNAL CAROTID UNI R MOD SED  03/15/2021   IR ANGIO INTRA EXTRACRAN SEL INTERNAL CAROTID UNI R MOD SED  03/26/2021   IR ANGIO INTRA EXTRACRAN SEL INTERNAL CAROTID UNI R MOD SED  03/25/2021   IR ANGIO VERTEBRAL SEL VERTEBRAL UNI L MOD SED  03/15/2021   IR ANGIO VERTEBRAL SEL VERTEBRAL UNI L MOD SED  03/26/2021   IR ANGIOGRAM FOLLOW UP STUDY  03/26/2021   IR ANGIOGRAM FOLLOW UP STUDY  03/26/2021   IR TRANSCATH/EMBOLIZ  03/26/2021   IR US GUIDE VASC ACCESS RIGHT  03/26/2021   LAPAROSCOPIC VAGINAL HYSTERECTOMY WITH SALPINGECTOMY Bilateral 10/01/2016   Procedure: LAPAROSCOPIC ASSISTED VAGINAL HYSTERECTOMY WITH SALPINGECTOMY;  Surgeon: Paula Compton, MD;  Location: [facility];  Service: Gynecology;  Laterality: Bilateral;   PARATHYROIDECTOMY Right 04/05/2021   Procedure: right inferioir PARATHYROIDECTOMY;  Surgeon: Armandina Gemma, MD;  Location: [facility];  Service: General;  Laterality: Right;   RADIOLOGY WITH ANESTHESIA N/A 03/15/2021   Procedure: IR WITH ANESTHESIA;  Surgeon: Consuella Lose, MD;  Location: [facility];  Service:  Radiology;  Laterality: N/A;   RADIOLOGY WITH ANESTHESIA N/A 03/26/2021   Procedure: IR WITH ANESTHESIA (Coiling and Embolization);  Surgeon: Consuella Lose, MD;  Location: [facility];  Service: Radiology;  Laterality: N/A;   WISDOM TOOTH EXTRACTION     Social History   Socioeconomic History   Marital status: Single    Spouse name: Not on file   Number of children: 1   Years of education: Not on file   Highest education level: Not on file  Occupational History   Occupation: Financial planner  Tobacco Use   Smoking status: Never   Smokeless tobacco: Never  Substance and Sexual Activity   Alcohol use: No   Drug use: No   Sexual activity: Yes    Birth control/protection: Condom  Other Topics Concern   Not on file  Social History Narrative   Not on file   Social Determinants of Health   Financial Resource Strain: Not on file  Food Insecurity: Not on file  Transportation Needs: Not on file  Physical Activity: Not on file  Stress: Not on file  Social Connections: Not on file  Intimate Partner Violence: Not on file   Current Outpatient Medications on File Prior to Visit  Medication Sig Dispense Refill   acetaminophen (TYLENOL) 500 MG tablet Take 1,000 mg by mouth every 6 (six) hours as needed for moderate pain or headache.     ALPRAZolam (XANAX XR) 1 MG 24 hr tablet Take 1 mg by mouth daily as needed for sleep.     calcium carbonate (TUMS - DOSED IN MG  ELEMENTAL CALCIUM) 500 MG chewable tablet Chew 1 tablet (200 mg of elemental calcium total) by mouth 2 (two) times daily. 60 tablet 0   escitalopram (LEXAPRO) 10 MG tablet Take 1 tablet (10 mg total) by mouth daily. (Patient taking differently: Take 10 mg by mouth at bedtime.) 30 tablet 1   ipratropium (ATROVENT) 0.06 % nasal spray USE 2 SPRAYS IN EACH NOSTRIL THREE TIMES DAILY (Patient taking differently: Place 2 sprays into both nostrils 2 (two) times daily as needed for rhinitis.) 15 mL 2   levothyroxine (SYNTHROID) 150  MCG tablet TAKE 1 TABLET (150 MCG) BY MOUTH DAILY before breakfast (Patient taking differently: Take 150 mcg by mouth daily before breakfast.) 45 tablet 1   metoprolol tartrate (LOPRESSOR) 25 MG tablet Take 1 tablet (25 mg total) by mouth daily. 60 tablet 0   potassium chloride SA (KLOR-CON) 20 MEQ tablet Take 1 tablet (20 mEq total) by mouth daily. 30 tablet 0   Vitamin D, Ergocalciferol, (DRISDOL) 1.25 MG (50000 UNIT) CAPS capsule TAKE 1 CAPSULE BY MOUTH 2 TIMES A WEEK (Patient taking differently: Take 50,000 Units by mouth See admin instructions. Monday and wednesday) 24 capsule 1   niMODipine (NIMOTOP) 30 MG capsule Take 2 capsules (60 mg total) by mouth every 4 (four) hours for 4 days. (Patient taking differently: Take 30 mg by mouth daily.) 48 capsule 0   No current facility-administered medications on file prior to visit.   Allergies  Allergen Reactions   Latex Rash   Penicillins Rash    Has patient had a PCN reaction causing immediate rash, facial/tongue/throat swelling, SOB or lightheadedness with hypotension: Yes Has patient had a PCN reaction causing severe rash involving mucus membranes or skin necrosis: No Has patient had a PCN reaction that required hospitalization No Has patient had a PCN reaction occurring within the last 10 years: No If all of the above answers are "NO", then may proceed with Cephalosporin use.    Family History  Problem Relation Age of Onset   Thyroid disease Mother    Hypertension Mother    Diabetes Father    Hypertension Other    Diabetes Other    Thyroid disease Other    Heart disease Other    PE: BP 132/86 (BP Location: Right Arm, Patient Position: Sitting, Cuff Size: Normal)   Pulse 78   Ht 5\' 8"  (1.727 m)   Wt 177 lb 6.4 oz (80.5 kg)   LMP 08/28/2016 (Exact Date) Comment: come back on 09/15/2016  SpO2 97%   BMI 26.97 kg/m  Wt Readings from Last 3 Encounters:  04/09/21 177 lb 6.4 oz (80.5 kg)  04/08/21 169 lb 12.1 oz (77 kg)  04/03/21  169 lb 8.5 oz (76.9 kg)   Constitutional: overweight, in NAD. No kyphosis. Eyes: PERRLA, EOMI, + bilateral symmetric exophthalmos, no lid lag ENT: moist mucous membranes, no thyromegaly, no cervical lymphadenopathy, + cervical scar is swollen and with ecchymosis above the scar, but only mildly painful Cardiovascular: RRR, No MRG Respiratory: CTA B Gastrointestinal: abdomen soft, NT, ND, BS+ Musculoskeletal: no deformities, strength intact in all 4 Skin: moist, warm, no rashes Neurological: no tremor with outstretched hands, DTR normal in all 4  Assessment: 1. Hypercalcemia/hyperparathyroidism  2.  Acquired Hypothyroidism  3.  Vitamin D deficiency  Plan: Patient with a history of severe hypercalcemia and vitamin D deficiency with undetectably high calcium in the last 2 years. -She has a history of constipation, but no history of nephrolithiasis, osteoporosis, fractures, abdominal pain, bone  pain. -We have tried to direct her to surgery and at last visit, she finally went and saw Dr. Harlow Asa recently -She recently was scheduled for presurgical evaluation before her parathyroid surgery, however, she was admitted for [facility] recently after having had what appears to be a seizure.  While in the hospital her calcium was undetectably high again.  She was hydrated, given bisphosphonates and she had a technetium sestamibi scan on 04/04/2021 which showed a parathyroid adenoma of the right in the right inferior part of the thyroid -She had right inferior parathyroidectomy after which her calcium normalized.  Of note, while in the hospital phosphorus was also very low, but it improved before discharge yesterday -She was started on calcium after surgery, 200 mg twice a day.  I advised her to continue this. -She does have occasional tingling in her body and I advised her in the situations to take an extra calcium tablet. -I do plan to repeat her calcium and the phosphorus level in a week - I will see the  patient back in 3 months  2.  Acquired hypothyroidism - latest thyroid labs reviewed with pt. >> normal: Lab Results  Component Value Date   TSH 0.403 04/04/2021  - she continues on LT4 150 mcg daily - pt feels good on this dose. - we discussed about taking the thyroid hormone every day, with water, >30 minutes before breakfast, separated by >4 hours from acid reflux medications, calcium, iron, multivitamins. Pt. is taking it correctly and is now taking this consistently -previously missing  doses - will check thyroid tests at next visit  3.  Vitamin D deficiency -Vitamin D level was as low as 12.4 in the past -As of now, she is on ergocalciferol 50,000 units twice a week, now taking consistently, previously missing doses. -Latest vitamin D level was high, however, so at this visit I advised her for 2 weeks to stay off vitamin D completely and then to restart the same dose only once a week -I will see her back in 3 months and we will recheck her vitamin D level then  Orders Placed This Encounter  Procedures   Calcium, ionized   Phosphorus   Component     Latest Ref Rng & Units 04/10/2021  Phosphorus     2.5 - 4.5 mg/dL 2.7  Calcium Ionized     4.8 - 5.6 mg/dL 5.40  Calcium and phosphorus levels normalized. Philemon Kingdom, MD PhD [facility]

## 2021-04-10 ENCOUNTER — Encounter: Payer: Self-pay | Admitting: Internal Medicine

## 2021-04-10 ENCOUNTER — Telehealth: Payer: BC Managed Care – PPO | Admitting: Family

## 2021-04-10 ENCOUNTER — Other Ambulatory Visit: Payer: BC Managed Care – PPO

## 2021-04-10 ENCOUNTER — Telehealth: Payer: Self-pay | Admitting: Internal Medicine

## 2021-04-10 DIAGNOSIS — E875 Hyperkalemia: Secondary | ICD-10-CM | POA: Diagnosis not present

## 2021-04-10 DIAGNOSIS — E213 Hyperparathyroidism, unspecified: Secondary | ICD-10-CM

## 2021-04-10 DIAGNOSIS — R202 Paresthesia of skin: Secondary | ICD-10-CM

## 2021-04-10 NOTE — Telephone Encounter (Signed)
Unable to leave a message for pt on VM. Mychart message sent to pt.

## 2021-04-10 NOTE — Telephone Encounter (Signed)
Patient states that she will get labs done and also she has a urgent care visit scheduled for 3:15pm today.

## 2021-04-10 NOTE — Telephone Encounter (Signed)
Patient is calling to say that she is having trouble determining what dosage she needs to take of the calcium carbonate.  Outside of package says 500 mg, but she thought she was supposed to only take 200 mg.  Patient states that if she is supposed to take 200 mg, she cannot find that strength.

## 2021-04-10 NOTE — Progress Notes (Signed)
Virtual Visit Consent   Amanda Henry, you are scheduled for a virtual visit with a [facility] provider today.     Just as with appointments in the office, your consent must be obtained to participate.  Your consent will be active for this visit and any virtual visit you may have with one of our providers in the next 365 days.     If you have a MyChart account, a copy of this consent can be sent to you electronically.  All virtual visits are billed to your insurance company just like a traditional visit in the office.    As this is a virtual visit, video technology does not allow for your provider to perform a traditional examination.  This may limit your provider's ability to fully assess your condition.  If your provider identifies any concerns that need to be evaluated in person or the need to arrange testing (such as labs, EKG, etc.), we will make arrangements to do so.     Although advances in technology are sophisticated, we cannot ensure that it will always work on either your end or our end.  If the connection with a video visit is poor, the visit may have to be switched to a telephone visit.  With either a video or telephone visit, we are not always able to ensure that we have a secure connection.     I need to obtain your verbal consent now.   Are you willing to proceed with your visit today?    Amanda Henry has provided verbal consent on 04/10/2021 for a virtual visit (video or telephone).   Amanda Dun, FNP   Date: 04/10/2021 3:51 PM   Virtual Visit via Video Note   I, Amanda Henry, connected with  Amanda Henry  ([id number], [birth year]) on 04/10/21 at  3:45 PM EST by a video-enabled telemedicine application and verified that I am speaking with the correct person using two identifiers.  Location: Patient: Virtual Visit Location Patient: Home Provider: Virtual Visit Location Provider: Home Office   I discussed the limitations of evaluation and management by  telemedicine and the availability of in person appointments. The patient expressed understanding and agreed to proceed.    History of Present Illness: Amanda Henry is a 55 y.o. who identifies as a female who was assigned female at birth, and is being seen today for tingling her fingers and hand and face. She reports it started early this morning in only the tips of her fingers. She had a parathryoidectomy on 04/03/21. She missed one dose of her calcium and was worried this was causing this. She reports she woke up with this swelling  She denies any swelling, itching, or SOB.     HPI: HPI  Problems:  Patient Active Problem List   Diagnosis Date Noted   Hypercalcemia 04/03/2021   Hypokalemia 04/03/2021   History of subarachnoid hemorrhage 04/03/2021   Hyperparathyroidism ([place]) 04/03/2021   SAH (subarachnoid hemorrhage) ([place]) 03/15/2021   Mixed hyperlipidemia 06/03/2018   Prediabetes 06/03/2018   S/P laparoscopic assisted vaginal hysterectomy (LAVH) 10/01/2016   Menorrhagia 09/10/2016   Iron deficiency anemia 05/06/2016   Acquired hypothyroidism 05/24/2013   Depression with anxiety 05/24/2013    Allergies:  Allergies  Allergen Reactions   Latex Rash   Penicillins Rash    Has patient had a PCN reaction causing immediate rash, facial/tongue/throat swelling, SOB or lightheadedness with hypotension: Yes Has patient had a PCN reaction causing severe rash involving mucus membranes or skin necrosis: No  Has patient had a PCN reaction that required hospitalization No Has patient had a PCN reaction occurring within the last 10 years: No If all of the above answers are "NO", then may proceed with Cephalosporin use.    Medications:  Current Outpatient Medications:    acetaminophen (TYLENOL) 500 MG tablet, Take 1,000 mg by mouth every 6 (six) hours as needed for moderate pain or headache., Disp: , Rfl:    ALPRAZolam (XANAX XR) 1 MG 24 hr tablet, Take 1 mg by mouth daily as needed for  sleep., Disp: , Rfl:    calcium carbonate (TUMS - DOSED IN MG ELEMENTAL CALCIUM) 500 MG chewable tablet, Chew 1 tablet (200 mg of elemental calcium total) by mouth 2 (two) times daily., Disp: 60 tablet, Rfl: 0   escitalopram (LEXAPRO) 10 MG tablet, Take 1 tablet (10 mg total) by mouth daily. (Patient taking differently: Take 10 mg by mouth at bedtime.), Disp: 30 tablet, Rfl: 1   ipratropium (ATROVENT) 0.06 % nasal spray, USE 2 SPRAYS IN EACH NOSTRIL THREE TIMES DAILY (Patient taking differently: Place 2 sprays into both nostrils 2 (two) times daily as needed for rhinitis.), Disp: 15 mL, Rfl: 2   levothyroxine (SYNTHROID) 150 MCG tablet, TAKE 1 TABLET (150 MCG) BY MOUTH DAILY before breakfast (Patient taking differently: Take 150 mcg by mouth daily before breakfast.), Disp: 45 tablet, Rfl: 1   metoprolol tartrate (LOPRESSOR) 25 MG tablet, Take 1 tablet (25 mg total) by mouth daily., Disp: 60 tablet, Rfl: 0   niMODipine (NIMOTOP) 30 MG capsule, Take 2 capsules (60 mg total) by mouth every 4 (four) hours for 4 days. (Patient taking differently: Take 30 mg by mouth daily.), Disp: 48 capsule, Rfl: 0   potassium chloride SA (KLOR-CON) 20 MEQ tablet, Take 1 tablet (20 mEq total) by mouth daily., Disp: 30 tablet, Rfl: 0   Vitamin D, Ergocalciferol, (DRISDOL) 1.25 MG (50000 UNIT) CAPS capsule, TAKE 1 CAPSULE BY MOUTH 2 TIMES A WEEK (Patient taking differently: Take 50,000 Units by mouth See admin instructions. Monday and wednesday), Disp: 24 capsule, Rfl: 1  Observations/Objective: Patient is well-developed, well-nourished in no acute distress.  Resting comfortably  at home.  Head is normocephalic, atraumatic.  No labored breathing.  Speech is clear and coherent with logical content.  Patient is alert and oriented at baseline.  No SOB or facial swelling   Assessment and Plan: 1. Hyperkalemia  2. Hypercalcemia  3. Paresthesia of both hands  4. Tingling of face  Continue potassium and calcium Pt is  going to get labs drawn now If tingling worsens or facial swelling go to ED   Follow Up Instructions: I discussed the assessment and treatment plan with the patient. The patient was provided an opportunity to ask questions and all were answered. The patient agreed with the plan and demonstrated an understanding of the instructions.  A copy of instructions were sent to the patient via MyChart unless otherwise noted below.     The patient was advised to call back or seek an in-person evaluation if the symptoms worsen or if the condition fails to improve as anticipated.  Time:  I spent 15 minutes with the patient via telehealth technology discussing the above problems/concerns.    Amanda Dun, FNP

## 2021-04-10 NOTE — Telephone Encounter (Signed)
The surgical team advised her to take 200 mg twice a day.  A Google search for calcium 200 mg gives me plenty of options.  However, if she cannot find this, she can try to take a Tums tablet with dinner for now.

## 2021-04-11 LAB — CALCIUM, IONIZED: Calcium, Ion: 5.4 mg/dL (ref 4.8–5.6)

## 2021-04-11 LAB — PHOSPHORUS: Phosphorus: 2.7 mg/dL (ref 2.5–4.5)

## 2021-04-12 ENCOUNTER — Encounter: Payer: Self-pay | Admitting: Nurse Practitioner

## 2021-04-12 ENCOUNTER — Telehealth: Payer: BC Managed Care – PPO | Admitting: Nurse Practitioner

## 2021-04-12 DIAGNOSIS — R202 Paresthesia of skin: Secondary | ICD-10-CM

## 2021-04-12 NOTE — Progress Notes (Signed)
Virtual Visit Consent   Amanda Henry, you are scheduled for a virtual visit with Mary-Margaret Hassell Done, [place], a [facility] provider, today.     Just as with appointments in the office, your consent must be obtained to participate.  Your consent will be active for this visit and any virtual visit you may have with one of our providers in the next 365 days.     If you have a MyChart account, a copy of this consent can be sent to you electronically.  All virtual visits are billed to your insurance company just like a traditional visit in the office.    As this is a virtual visit, video technology does not allow for your provider to perform a traditional examination.  This may limit your provider's ability to fully assess your condition.  If your provider identifies any concerns that need to be evaluated in person or the need to arrange testing (such as labs, EKG, etc.), we will make arrangements to do so.     Although advances in technology are sophisticated, we cannot ensure that it will always work on either your end or our end.  If the connection with a video visit is poor, the visit may have to be switched to a telephone visit.  With either a video or telephone visit, we are not always able to ensure that we have a secure connection.     I need to obtain your verbal consent now.   Are you willing to proceed with your visit today? YES   Amanda Henry has provided verbal consent on 04/12/2021 for a virtual visit (video or telephone).   Mary-Margaret Hassell Done, FNP   Date: 04/12/2021 4:23 PM   Virtual Visit via Video Note   I, Mary-Margaret Hassell Done, connected with Amanda Henry ([id number], 1966/04/26) on 04/12/21 at  4:30 PM EST by a video-enabled telemedicine application and verified that I am speaking with the correct person using two identifiers.  Location: Patient: Virtual Visit Location Patient: Home Provider: Virtual Visit Location Provider: Mobile   I discussed the  limitations of evaluation and management by telemedicine and the availability of in person appointments. The patient expressed understanding and agreed to proceed.    History of Present Illness: Amanda Henry is a 55 y.o. who identifies as a female who was assigned female at birth, and is being seen today for calcium level.  HPI: Patient states that she had parathyroid surgery  a couple of days ago. She is suppose to take calcium 500mg  daily. She sya sthat while in hospital her calcium level dropped and she had tingling of her face and tips of her fingers. She did not know wether to take extra calcium or not. Other sie she feels fine.   Review of Systems  Constitutional:  Negative for chills, fever and malaise/fatigue.  Respiratory: Negative.    Genitourinary: Negative.   Musculoskeletal: Negative.   Neurological: Negative.        Tingling of jaw line and tips of fingers.  Psychiatric/Behavioral: Negative.     Problems:  Patient Active Problem List   Diagnosis Date Noted   Hypercalcemia 04/03/2021   Hypokalemia 04/03/2021   History of subarachnoid hemorrhage 04/03/2021   Hyperparathyroidism ([place]) 04/03/2021   SAH (subarachnoid hemorrhage) ([place]) 03/15/2021   Mixed hyperlipidemia 06/03/2018   Prediabetes 06/03/2018   S/P laparoscopic assisted vaginal hysterectomy (LAVH) 10/01/2016   Menorrhagia 09/10/2016   Iron deficiency anemia 05/06/2016   Acquired hypothyroidism 05/24/2013   Depression with anxiety 05/24/2013  Allergies:  Allergies  Allergen Reactions   Latex Rash   Penicillins Rash    Has patient had a PCN reaction causing immediate rash, facial/tongue/throat swelling, SOB or lightheadedness with hypotension: Yes Has patient had a PCN reaction causing severe rash involving mucus membranes or skin necrosis: No Has patient had a PCN reaction that required hospitalization No Has patient had a PCN reaction occurring within the last 10 years: No If all of the above answers  are "NO", then may proceed with Cephalosporin use.    Medications:  Current Outpatient Medications:    acetaminophen (TYLENOL) 500 MG tablet, Take 1,000 mg by mouth every 6 (six) hours as needed for moderate pain or headache., Disp: , Rfl:    ALPRAZolam (XANAX XR) 1 MG 24 hr tablet, Take 1 mg by mouth daily as needed for sleep., Disp: , Rfl:    calcium carbonate (TUMS - DOSED IN MG ELEMENTAL CALCIUM) 500 MG chewable tablet, Chew 1 tablet (200 mg of elemental calcium total) by mouth 2 (two) times daily., Disp: 60 tablet, Rfl: 0   escitalopram (LEXAPRO) 10 MG tablet, Take 1 tablet (10 mg total) by mouth daily. (Patient taking differently: Take 10 mg by mouth at bedtime.), Disp: 30 tablet, Rfl: 1   ipratropium (ATROVENT) 0.06 % nasal spray, USE 2 SPRAYS IN EACH NOSTRIL THREE TIMES DAILY (Patient taking differently: Place 2 sprays into both nostrils 2 (two) times daily as needed for rhinitis.), Disp: 15 mL, Rfl: 2   levothyroxine (SYNTHROID) 150 MCG tablet, TAKE 1 TABLET (150 MCG) BY MOUTH DAILY before breakfast (Patient taking differently: Take 150 mcg by mouth daily before breakfast.), Disp: 45 tablet, Rfl: 1   metoprolol tartrate (LOPRESSOR) 25 MG tablet, Take 1 tablet (25 mg total) by mouth daily., Disp: 60 tablet, Rfl: 0   niMODipine (NIMOTOP) 30 MG capsule, Take 2 capsules (60 mg total) by mouth every 4 (four) hours for 4 days. (Patient taking differently: Take 30 mg by mouth daily.), Disp: 48 capsule, Rfl: 0   potassium chloride SA (KLOR-CON) 20 MEQ tablet, Take 1 tablet (20 mEq total) by mouth daily., Disp: 30 tablet, Rfl: 0   Vitamin D, Ergocalciferol, (DRISDOL) 1.25 MG (50000 UNIT) CAPS capsule, TAKE 1 CAPSULE BY MOUTH 2 TIMES A WEEK (Patient taking differently: Take 50,000 Units by mouth See admin instructions. Monday and wednesday), Disp: 24 capsule, Rfl: 1  Observations/Objective: Patient is well-developed, well-nourished in no acute distress.  Resting comfortably  at home.  Head is  normocephalic, atraumatic.  No labored breathing.  Speech is clear and coherent with logical content.  Patient is alert and oriented at baseline.  Lab Results  Component Value Date   CALCIUM 5.4 04/10/2021   PHOS 2.7 04/10/2021     Assessment and Plan:  Amanda Henry in today with chief complaint of facial tingling  1. Paresthesia of both hands 2. Facial tingling Patient was told to continue calcium 500mg  1x daily- may take a few days to get leveled out. As long as does not worsen she can wait and have labs rechecked on MOnday. If worsens to ED for lab work. Patient hospital discharge summary as reviewed.   Follow Up Instructions: I discussed the assessment and treatment plan with the patient. The patient was provided an opportunity to ask questions and all were answered. The patient agreed with the plan and demonstrated an understanding of the instructions.  A copy of instructions were sent to the patient via MyChart.  The patient was advised to call  back or seek an in-person evaluation if the symptoms worsen or if the condition fails to improve as anticipated.  Time:  I spent 17 minutes with the patient via telehealth technology discussing the above problems/concerns.    Mary-Margaret Hassell Done, FNP

## 2021-04-15 ENCOUNTER — Telehealth: Payer: Self-pay

## 2021-04-15 ENCOUNTER — Other Ambulatory Visit: Payer: Self-pay

## 2021-04-15 ENCOUNTER — Encounter: Payer: Self-pay | Admitting: Nurse Practitioner

## 2021-04-15 ENCOUNTER — Ambulatory Visit: Payer: BC Managed Care – PPO | Admitting: Nurse Practitioner

## 2021-04-15 VITALS — BP 124/80 | Temp 98.5°F | Ht 68.0 in | Wt 183.0 lb

## 2021-04-15 DIAGNOSIS — R413 Other amnesia: Secondary | ICD-10-CM | POA: Diagnosis not present

## 2021-04-15 DIAGNOSIS — E892 Postprocedural hypoparathyroidism: Secondary | ICD-10-CM | POA: Diagnosis not present

## 2021-04-15 DIAGNOSIS — E213 Hyperparathyroidism, unspecified: Secondary | ICD-10-CM | POA: Diagnosis not present

## 2021-04-15 DIAGNOSIS — R202 Paresthesia of skin: Secondary | ICD-10-CM

## 2021-04-15 DIAGNOSIS — E876 Hypokalemia: Secondary | ICD-10-CM

## 2021-04-15 DIAGNOSIS — Z8679 Personal history of other diseases of the circulatory system: Secondary | ICD-10-CM

## 2021-04-15 MED ORDER — METOPROLOL TARTRATE 25 MG PO TABS: 25.0000 mg | ORAL_TABLET | Freq: Every day | ORAL | 0 refills | Status: DC

## 2021-04-15 NOTE — Telephone Encounter (Signed)
Transition Care Management Follow-up Telephone Call Date of discharge and from where: 04/08/2021 [facility] How have you been since you were released from the hospital? Pt states she feels okay, she does report tingling in face / fingers since discharge.  Any questions or concerns? No  Items Reviewed: Did the pt receive and understand the discharge instructions provided? No  Medications obtained and verified? Yes  Other? Yes  Any new allergies since your discharge? No  Dietary orders reviewed? Yes Do you have support at home? Yes   Home Care and Equipment/Supplies: Were home health services ordered? no If so, what is the name of the agency? N/a  Has the agency set up a time to come to the patient's home? no Were any new equipment or medical supplies ordered?  No What is the name of the medical supply agency? N/a Were you able to get the supplies/equipment? not applicable Do you have any questions related to the use of the equipment or supplies? No  Functional Questionnaire: (I = Independent and D = Dependent) ADLs: i  Bathing/Dressing- i  Meal Prep- i  Eating- i  Maintaining continence- i  Transferring/Ambulation- i  Managing Meds- i  Follow up appointments reviewed:  PCP Hospital f/u appt confirmed? Yes  Scheduled to see [name] on 04/15/2021 @ [facility]. [facility] f/u appt confirmed? No  Scheduled to see n/a on n/a @ n/a. Are transportation arrangements needed? No  If their condition worsens, is the pt aware to call PCP or go to the Emergency Dept.? Yes Was the patient provided with contact information for the PCP's office or ED? Yes Was to pt encouraged to call back with questions or concerns? Yes

## 2021-04-15 NOTE — Progress Notes (Signed)
I,Amanda Henry,acting as a Education administrator for [employer], FNP.,have documented all relevant documentation on the behalf of Amanda Brine, FNP,as directed by  Amanda Brine, FNP while in the presence of Amanda Henry, [place].   This visit occurred during the SARS-CoV-2 public health emergency.  Safety protocols were in place, including screening questions prior to the visit, additional usage of staff PPE, and extensive cleaning of exam room while observing appropriate contact time as indicated for disinfecting solutions.  Subjective:     Patient ID: Amanda Henry , female    DOB: 1966-02-25 , 55 y.o.   MRN: [id number]   Chief Complaint  Patient presents with   Tingling    face    HPI  The patient is here today for evaluation of tingling in her face that started when she had her parathyroid removed on 04/12/2021.  The patient states she was advised by the surgeon to see her primary about the tingling.   She has also been recently discharged from rehab after having   55 y.o. female with medical history significant of hypertension, iron deficiency anemia, hypothyroidism, and hyperparathyroidism who had just recently been hospitalized on neurosurgery service from 10/28-11/14 after presenting with severe headache found to have subarachnoid hemorrhage.  Patient was transferred to [facility] for parathyroidectomy which she underwent on 04/05/2021.    She reports she felt a rush across the front of her head then could not lift her head, urinated on herself. Unsure of convulsions. Her calcium was elevated, removed the parathyroid.  She was having tingling to her fingers and face. She was advised to get potassium and calcium carbonate.   She is scheduled to see General Surgery December 14th, Todd Gerkin MD. She is to follow up with Dr. Renne Crigler in February. She denies having any residual. She did not feel she needed additional therapy such as PT/OT.   She has been taking calcium, she is taking  potassium as well.     Past Medical History:  Diagnosis Date   Anxiety    Blood transfusion without reported diagnosis    Depression    Hypertension    no medications at this time   Hypothyroidism    Iron deficiency anemia    Thyroid disease      Family History  Problem Relation Age of Onset   Thyroid disease Mother    Hypertension Mother    Diabetes Father    Hypertension Other    Diabetes Other    Thyroid disease Other    Heart disease Other      Current Outpatient Medications:    acetaminophen (TYLENOL) 500 MG tablet, Take 1,000 mg by mouth every 6 (six) hours as needed for moderate pain or headache., Disp: , Rfl:    ALPRAZolam (XANAX XR) 1 MG 24 hr tablet, Take 1 mg by mouth daily as needed for sleep., Disp: , Rfl:    calcium carbonate (TUMS - DOSED IN MG ELEMENTAL CALCIUM) 500 MG chewable tablet, Chew 1 tablet (200 mg of elemental calcium total) by mouth 2 (two) times daily., Disp: 60 tablet, Rfl: 0   escitalopram (LEXAPRO) 10 MG tablet, Take 1 tablet (10 mg total) by mouth daily. (Patient taking differently: Take 10 mg by mouth at bedtime.), Disp: 30 tablet, Rfl: 1   ipratropium (ATROVENT) 0.06 % nasal spray, USE 2 SPRAYS IN EACH NOSTRIL THREE TIMES DAILY (Patient taking differently: Place 2 sprays into both nostrils 2 (two) times daily as needed for rhinitis.), Disp: 15 mL, Rfl: 2  levothyroxine (SYNTHROID) 150 MCG tablet, TAKE 1 TABLET(150 MCG) BY MOUTH DAILY BEFORE BREAKFAST, Disp: 45 tablet, Rfl: 1   metoprolol tartrate (LOPRESSOR) 25 MG tablet, Take 1 tablet (25 mg total) by mouth daily., Disp: 90 tablet, Rfl: 0   potassium chloride SA (KLOR-CON) 20 MEQ tablet, Take 1 tablet (20 mEq total) by mouth daily., Disp: 30 tablet, Rfl: 0   Vitamin D, Ergocalciferol, (DRISDOL) 1.25 MG (50000 UNIT) CAPS capsule, TAKE 1 CAPSULE BY MOUTH 2 TIMES A WEEK (Patient taking differently: Take 50,000 Units by mouth See admin instructions. Monday and wednesday), Disp: 24 capsule, Rfl: 1    Allergies  Allergen Reactions   Latex Rash   Penicillins Rash    Has patient had a PCN reaction causing immediate rash, facial/tongue/throat swelling, SOB or lightheadedness with hypotension: Yes Has patient had a PCN reaction causing severe rash involving mucus membranes or skin necrosis: No Has patient had a PCN reaction that required hospitalization No Has patient had a PCN reaction occurring within the last 10 years: No If all of the above answers are "NO", then may proceed with Cephalosporin use.      Review of Systems  Constitutional: Negative.   Respiratory: Negative.    Cardiovascular: Negative.   Gastrointestinal: Negative.   Psychiatric/Behavioral: Negative.    All other systems reviewed and are negative.   Today's Vitals   04/15/21 1442  BP: 124/80  Temp: 98.5 F (36.9 C)  Weight: 183 lb (83 kg)  Height: '5\' 8"'  (1.727 m)   Body mass index is 27.83 kg/m.  Wt Readings from Last 3 Encounters:  04/15/21 183 lb (83 kg)  04/09/21 177 lb 6.4 oz (80.5 kg)  04/08/21 169 lb 12.1 oz (77 kg)    BP Readings from Last 3 Encounters:  04/15/21 124/80  04/09/21 132/86  04/08/21 (!) 155/82    Objective:  Physical Exam Vitals reviewed.  Constitutional:      General: She is not in acute distress.    Appearance: Normal appearance.  Cardiovascular:     Rate and Rhythm: Normal rate and regular rhythm.     Pulses: Normal pulses.     Heart sounds: Normal heart sounds. No murmur heard. Skin:    Comments: Neck incision to right, clean incision with some puffiness.   Neurological:     General: No focal deficit present.     Mental Status: She is alert and oriented to person, place, and time.     Cranial Nerves: No cranial nerve deficit.     Motor: No weakness.  Psychiatric:        Mood and Affect: Mood normal.        Behavior: Behavior normal.        Thought Content: Thought content normal.        Judgment: Judgment normal.        Assessment And Plan:     1.  Hypokalemia Comments: Will recheck her potassium - BMP8+eGFR  2. History of subarachnoid hemorrhage - metoprolol tartrate (LOPRESSOR) 25 MG tablet; Take 1 tablet (25 mg total) by mouth daily.  Dispense: 90 tablet; Refill: 0  3. Hyperparathyroidism ([place])  4. S/P parathyroidectomy ([place]) TCM Performed. A member of the clinical team spoke with the patient upon dischare. Discharge summary was reviewed in full detail during the visit. Meds reconciled and compared to discharge meds. Medication list is updated and reviewed with the patient.  Greater than 50% face to face time was spent in counseling an coordination of care.  All questions were answered to the satisfaction of the patient.    5. Memory change Comments: She is having more problems with her memory since her subarachnoid bleed will have her evaluated by speech therapy - Ambulatory referral to Speech Therapy  6. Tingling of right arm and right side of face Comments: Likely related to her parathryoid surgery, she is to continue follow up with Endocrinology - Vitamin B12 - Methylmalonic Acid  7. Hypercalcemia Comments: Continue follow up with Endocrinology    Patient was given opportunity to ask questions. Patient verbalized understanding of the plan and was able to repeat key elements of the plan. All questions were answered to their satisfaction.  Amanda Brine, FNP   I, Amanda Brine, FNP, have reviewed all documentation for this visit. The documentation on 04/15/21 for the exam, diagnosis, procedures, and orders are all accurate and complete.   IF YOU HAVE BEEN REFERRED TO A SPECIALIST, IT MAY TAKE 1-2 WEEKS TO SCHEDULE/PROCESS THE REFERRAL. IF YOU HAVE NOT HEARD FROM US/SPECIALIST IN TWO WEEKS, PLEASE GIVE [place] A CALL AT [phone] X [phone].   THE PATIENT IS ENCOURAGED TO PRACTICE SOCIAL DISTANCING DUE TO THE COVID-19 PANDEMIC.

## 2021-04-16 ENCOUNTER — Encounter: Payer: Self-pay | Admitting: Nurse Practitioner

## 2021-04-17 LAB — BMP8+EGFR
BUN/Creatinine Ratio: 11 (ref 9–23)
BUN: 9 mg/dL (ref 6–24)
CO2: 24 mmol/L (ref 20–29)
Calcium: 9.1 mg/dL (ref 8.7–10.2)
Chloride: 105 mmol/L (ref 96–106)
Creatinine, Ser: 0.81 mg/dL (ref 0.57–1.00)
Glucose: 107 mg/dL — ABNORMAL HIGH (ref 70–99)
Potassium: 3.9 mmol/L (ref 3.5–5.2)
Sodium: 141 mmol/L (ref 134–144)
eGFR: 87 mL/min/{1.73_m2} (ref >59)

## 2021-04-17 LAB — METHYLMALONIC ACID, SERUM: Methylmalonic Acid: 108 nmol/L (ref 0–378)

## 2021-04-17 LAB — VITAMIN B12: Vitamin B-12: 365 pg/mL (ref 232–1245)

## 2021-04-18 ENCOUNTER — Other Ambulatory Visit: Payer: Self-pay | Admitting: Nurse Practitioner

## 2021-04-18 DIAGNOSIS — E039 Hypothyroidism, unspecified: Secondary | ICD-10-CM

## 2021-04-22 ENCOUNTER — Encounter: Payer: Self-pay | Admitting: Nurse Practitioner

## 2021-04-22 ENCOUNTER — Other Ambulatory Visit: Payer: Self-pay

## 2021-04-22 ENCOUNTER — Ambulatory Visit: Payer: BC Managed Care – PPO | Admitting: Nurse Practitioner

## 2021-04-22 VITALS — Temp 98.7°F | Ht 67.6 in | Wt 181.0 lb

## 2021-04-22 DIAGNOSIS — R42 Dizziness and giddiness: Secondary | ICD-10-CM

## 2021-04-22 DIAGNOSIS — Z8679 Personal history of other diseases of the circulatory system: Secondary | ICD-10-CM

## 2021-04-22 MED ORDER — METOPROLOL TARTRATE 25 MG PO TABS: 25.0000 mg | ORAL_TABLET | Freq: Every day | ORAL | 0 refills | Status: DC

## 2021-04-22 MED ORDER — MECLIZINE HCL 12.5 MG PO TABS: 12.5000 mg | ORAL_TABLET | Freq: Three times a day (TID) | ORAL | 0 refills | Status: DC | PRN

## 2021-04-22 NOTE — Progress Notes (Signed)
Rich Brave [place],acting as a Education administrator for [name], FNP.,have documented all relevant documentation on the behalf of [name], FNP,as directed by  [name], FNP while in the presence of [name], [place].  This visit occurred during the SARS-CoV-2 public health emergency.  Safety protocols were in place, including screening questions prior to the visit, additional usage of staff PPE, and extensive cleaning of exam room while observing appropriate contact time as indicated for disinfecting solutions.  Subjective:     Patient ID: Amanda Henry , female    DOB: 01-27-1966 , 55 y.o.   MRN: [id number]   Chief Complaint  Patient presents with   Dizziness    HPI  Patient stated when she wakes up in the mornings she has been feeling dizzy. She denies having any other symptoms. She also stated she felt like she was going to have an anxiety attack on the way over here. She feels better after eating breakfast. She is now staying at her parents. Began a few days ago. She reports she still has 2 staples to her head and needs to call the surgery center. She is not taking the metoprolol. She does have a blood pressure.   Dizziness This is a new problem. The current episode started in the past 7 days. The problem occurs constantly. The problem has been unchanged. Pertinent negatives include no headaches. Associated symptoms comments: Light headedness. Nothing aggravates the symptoms.    Past Medical History:  Diagnosis Date   Anxiety    Blood transfusion without reported diagnosis    Depression    Hypertension    no medications at this time   Hypothyroidism    Iron deficiency anemia    Thyroid disease      Family History  Problem Relation Age of Onset   Thyroid disease Mother    Hypertension Mother    Diabetes Father    Hypertension Other    Diabetes Other    Thyroid disease Other    Heart disease Other      Current Outpatient Medications:    acetaminophen (TYLENOL) 500  MG tablet, Take 1,000 mg by mouth every 6 (six) hours as needed for moderate pain or headache., Disp: , Rfl:    ALPRAZolam (XANAX XR) 1 MG 24 hr tablet, Take 1 mg by mouth daily as needed for sleep., Disp: , Rfl:    calcium carbonate (TUMS - DOSED IN MG ELEMENTAL CALCIUM) 500 MG chewable tablet, Chew 1 tablet (200 mg of elemental calcium total) by mouth 2 (two) times daily., Disp: 60 tablet, Rfl: 0   escitalopram (LEXAPRO) 10 MG tablet, Take 1 tablet (10 mg total) by mouth daily. (Patient taking differently: Take 10 mg by mouth at bedtime.), Disp: 30 tablet, Rfl: 1   ipratropium (ATROVENT) 0.06 % nasal spray, USE 2 SPRAYS IN EACH NOSTRIL THREE TIMES DAILY (Patient taking differently: Place 2 sprays into both nostrils 2 (two) times daily as needed for rhinitis.), Disp: 15 mL, Rfl: 2   levothyroxine (SYNTHROID) 150 MCG tablet, TAKE 1 TABLET(150 MCG) BY MOUTH DAILY BEFORE BREAKFAST, Disp: 45 tablet, Rfl: 1   meclizine (ANTIVERT) 12.5 MG tablet, Take 1 tablet (12.5 mg total) by mouth 3 (three) times daily as needed for dizziness., Disp: 30 tablet, Rfl: 0   potassium chloride SA (KLOR-CON) 20 MEQ tablet, Take 1 tablet (20 mEq total) by mouth daily., Disp: 30 tablet, Rfl: 0   Vitamin D, Ergocalciferol, (DRISDOL) 1.25 MG (50000 UNIT) CAPS capsule, TAKE 1 CAPSULE BY MOUTH  2 TIMES A WEEK (Patient taking differently: Take 50,000 Units by mouth See admin instructions. Monday and wednesday), Disp: 24 capsule, Rfl: 1   metoprolol tartrate (LOPRESSOR) 25 MG tablet, Take 1 tablet (25 mg total) by mouth daily., Disp: 90 tablet, Rfl: 0   Allergies  Allergen Reactions   Latex Rash   Penicillins Rash    Has patient had a PCN reaction causing immediate rash, facial/tongue/throat swelling, SOB or lightheadedness with hypotension: Yes Has patient had a PCN reaction causing severe rash involving mucus membranes or skin necrosis: No Has patient had a PCN reaction that required hospitalization No Has patient had a PCN  reaction occurring within the last 10 years: No If all of the above answers are "NO", then may proceed with Cephalosporin use.      Review of Systems  Constitutional: Negative.   Respiratory: Negative.    Cardiovascular: Negative.   Neurological:  Positive for dizziness. Negative for headaches.  Psychiatric/Behavioral: Negative.      Today's Vitals   04/22/21 1638  Temp: 98.7 F (37.1 C)  Weight: 181 lb (82.1 kg)  Height: 5' 7.6" (1.717 m)  PainSc: 0-No pain   Body mass index is 27.85 kg/m.   Objective:  Physical Exam Vitals reviewed.  Constitutional:      Appearance: Normal appearance.  Cardiovascular:     Pulses: Normal pulses.     Heart sounds: Normal heart sounds. No murmur heard. Pulmonary:     Effort: Pulmonary effort is normal. No respiratory distress.     Breath sounds: Normal breath sounds. No wheezing.  Neurological:     General: No focal deficit present.     Mental Status: She is alert and oriented to person, place, and time.     Cranial Nerves: No cranial nerve deficit.     Motor: No weakness.     Coordination: Romberg sign positive.  Psychiatric:        Mood and Affect: Mood normal.        Behavior: Behavior normal.        Thought Content: Thought content normal.        Judgment: Judgment normal.        Assessment And Plan:     1. Dizziness Comments: She is having persistent dizziness, slight positive Romberg. Since she had a Subarachnoid hemorrhage it may be good to refer for further evaluation. Orthostats done, normal - Ambulatory referral to Neurology  2. History of subarachnoid hemorrhage - metoprolol tartrate (LOPRESSOR) 25 MG tablet; Take 1 tablet (25 mg total) by mouth daily.  Dispense: 90 tablet; Refill: 0 - Ambulatory referral to Neurology    Patient was given opportunity to ask questions. Patient verbalized understanding of the plan and was able to repeat key elements of the plan. All questions were answered to their satisfaction.   [name], FNP   I, [name], FNP, have reviewed all documentation for this visit. The documentation on 04/22/21 for the exam, diagnosis, procedures, and orders are all accurate and complete.   IF YOU HAVE BEEN REFERRED TO A SPECIALIST, IT MAY TAKE 1-2 WEEKS TO SCHEDULE/PROCESS THE REFERRAL. IF YOU HAVE NOT HEARD FROM US/SPECIALIST IN TWO WEEKS, PLEASE GIVE [place] A CALL AT [phone].   THE PATIENT IS ENCOURAGED TO PRACTICE SOCIAL DISTANCING DUE TO THE COVID-19 PANDEMIC.

## 2021-04-23 ENCOUNTER — Encounter: Payer: Self-pay | Admitting: Occupational Therapy

## 2021-04-23 ENCOUNTER — Ambulatory Visit: Payer: BC Managed Care – PPO | Admitting: Occupational Therapy

## 2021-04-23 ENCOUNTER — Ambulatory Visit: Payer: BC Managed Care – PPO | Attending: Nurse Practitioner | Admitting: Physical Therapy

## 2021-04-23 ENCOUNTER — Encounter: Payer: Self-pay | Admitting: Physical Therapy

## 2021-04-23 DIAGNOSIS — E213 Hyperparathyroidism, unspecified: Secondary | ICD-10-CM | POA: Insufficient documentation

## 2021-04-23 DIAGNOSIS — M6281 Muscle weakness (generalized): Secondary | ICD-10-CM

## 2021-04-23 DIAGNOSIS — R2681 Unsteadiness on feet: Secondary | ICD-10-CM | POA: Insufficient documentation

## 2021-04-23 DIAGNOSIS — I69054 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: Secondary | ICD-10-CM | POA: Insufficient documentation

## 2021-04-23 DIAGNOSIS — I69019 Unspecified symptoms and signs involving cognitive functions following nontraumatic subarachnoid hemorrhage: Secondary | ICD-10-CM | POA: Insufficient documentation

## 2021-04-23 DIAGNOSIS — R41841 Cognitive communication deficit: Secondary | ICD-10-CM | POA: Insufficient documentation

## 2021-04-23 DIAGNOSIS — Z8679 Personal history of other diseases of the circulatory system: Secondary | ICD-10-CM | POA: Diagnosis not present

## 2021-04-23 DIAGNOSIS — R42 Dizziness and giddiness: Secondary | ICD-10-CM | POA: Insufficient documentation

## 2021-04-23 NOTE — Therapy (Signed)
[facility]. [place], [place], [address] Phone: [phone]   Fax:  [phone]  Physical Therapy Evaluation  Patient Details  Name: Amanda Henry MRN: [id number] Date of Birth: 27-May-1965 Referring Provider (PT): Doreene Burke Moore/Christine Gherkin   Encounter Date: 04/23/2021   PT End of Session - 04/23/21 1417     Visit Number 1    Number of Visits 8    Date for PT Re-Evaluation 05/21/21    PT Start Time 1319    PT Stop Time 1400    PT Time Calculation (min) 41 min    Behavior During Therapy WFL for tasks assessed/performed             Past Medical History:  Diagnosis Date   Anxiety    Blood transfusion without reported diagnosis    Depression    Hypertension    no medications at this time   Hypothyroidism    Iron deficiency anemia    Thyroid disease     Past Surgical History:  Procedure Laterality Date   IR ANGIO INTRA EXTRACRAN SEL COM CAROTID INNOMINATE UNI L MOD SED  03/16/2021   IR ANGIO INTRA EXTRACRAN SEL COM CAROTID INNOMINATE UNI L MOD SED  03/26/2021   IR ANGIO INTRA EXTRACRAN SEL INTERNAL CAROTID UNI R MOD SED  03/15/2021   IR ANGIO INTRA EXTRACRAN SEL INTERNAL CAROTID UNI R MOD SED  03/26/2021   IR ANGIO INTRA EXTRACRAN SEL INTERNAL CAROTID UNI R MOD SED  03/25/2021   IR ANGIO VERTEBRAL SEL VERTEBRAL UNI L MOD SED  03/15/2021   IR ANGIO VERTEBRAL SEL VERTEBRAL UNI L MOD SED  03/26/2021   IR ANGIOGRAM FOLLOW UP STUDY  03/26/2021   IR ANGIOGRAM FOLLOW UP STUDY  03/26/2021   IR TRANSCATH/EMBOLIZ  03/26/2021   IR US GUIDE VASC ACCESS RIGHT  03/26/2021   LAPAROSCOPIC VAGINAL HYSTERECTOMY WITH SALPINGECTOMY Bilateral 10/01/2016   Procedure: LAPAROSCOPIC ASSISTED VAGINAL HYSTERECTOMY WITH SALPINGECTOMY;  Surgeon: Paula Compton, MD;  Location: [facility];  Service: Gynecology;  Laterality: Bilateral;   PARATHYROIDECTOMY Right 04/05/2021   Procedure: right inferioir PARATHYROIDECTOMY;  Surgeon:  Armandina Gemma, MD;  Location: [facility];  Service: General;  Laterality: Right;   RADIOLOGY WITH ANESTHESIA N/A 03/15/2021   Procedure: IR WITH ANESTHESIA;  Surgeon: Consuella Lose, MD;  Location: [facility];  Service: Radiology;  Laterality: N/A;   RADIOLOGY WITH ANESTHESIA N/A 03/26/2021   Procedure: IR WITH ANESTHESIA (Coiling and Embolization);  Surgeon: Consuella Lose, MD;  Location: [facility];  Service: Radiology;  Laterality: N/A;   WISDOM TOOTH EXTRACTION      There were no vitals filed for this visit.    Subjective Assessment - 04/23/21 1322     Subjective Patient sustained subarachnoid hemorrhage10/28-coiled, parathyroid surgery 11/18  dizziness noted. Has Meclizine, but has not needed it today.    Pertinent History Parathyroid surgery. H/O sciatica    How long can you sit comfortably? N/A    How long can you stand comfortably? Unable to stand out in public spaces due to feeling unstable, due to weakness and dizziness.    How long can you walk comfortably? Walks in neighborhood, but limited and has to stop.    Patient Stated Goals Return to her normal level of activity, drive, live I.    Currently in Pain? Yes    Pain Score 5     Pain Location Knee    Pain Orientation Left;Lateral;Proximal    Pain Descriptors / Indicators  Aching;Tightness    Pain Type Chronic pain    Pain Onset More than a month ago    Pain Frequency Intermittent    Aggravating Factors  Certain movements.    Pain Relieving Factors Voltaren, lying on R side.    Effect of Pain on Daily Activities liimts walking.                [facility] PT Assessment - 04/23/21 0001       Assessment   Medical Diagnosis R subarachnoid hemorrhage.    Referring Provider (PT) Doreene Burke Moore/Christine Gherkin    Next MD Visit 05/09/21      Balance Screen   Has the patient fallen in the past 6 months No      [place] residence    Living Arrangements Children    Available Help at Discharge  Family    Type of [place] Access Level entry    [place] One level      Prior Function   Level of Independence Independent    Vocation Full time employment    Public librarian    Leisure read, workout      Cognition   Overall Cognitive Status Within Functional Limits for tasks assessed      Posture/Postural Control   Posture Comments Mildly flattened thoracic kyphosis      ROM / Strength   AROM / PROM / Strength AROM;Strength      AROM   Overall AROM  Within functional limits for tasks performed      Strength   Overall Strength Comments BUE grossly WFL    Strength Assessment Site Hip;Knee;Ankle    Right/Left Hip Right;Left    Right Hip Flexion 4-/5    Right Hip Extension 3+/5    Right Hip ABduction 4-/5    Left Hip Flexion 4-/5    Left Hip Extension 3/5    Left Hip ABduction 3+/5    Right/Left Knee Right;Left    Right Knee Flexion 4-/5    Right Knee Extension 4-/5    Left Knee Flexion 4-/5    Left Knee Extension 4-/5    Right/Left Ankle Right;Left    Right Ankle Dorsiflexion 4-/5    Right Ankle Plantar Flexion 4-/5    Right Ankle Inversion 4-/5    Right Ankle Eversion 4-/5    Left Ankle Dorsiflexion 4-/5    Left Ankle Plantar Flexion 4-/5    Left Ankle Inversion 4-/5    Left Ankle Eversion 4-/5      Flexibility   Soft Tissue Assessment /Muscle Length yes    Piriformis L spasmed and tight    Quadratus Lumborum L tight      Palpation   SI assessment  (-) for ant/post torsion of L hemipelvis.      Special Tests   Other special tests Lumbar ROM screen (-). Mild limitation on L in flexion and R lateral flex.      Transfers   Five time sit to stand comments  10.75      Balance   Balance Assessed Yes      Standardized Balance Assessment   Standardized Balance Assessment Timed Up and Go Test      Timed Up and Go Test   Normal TUG (seconds) 6.72    Cognitive TUG (seconds) 6.16      High Level Balance   High Level Balance  Comments Vestibular screen warrented as patient reported dizziness  upon turning from Sup to R side lie on the mat.                        Objective measurements completed on examination: See above findings.                PT Education - 04/23/21 1416     Education Details POC- vestibular assessment and high level balance assessment for speed of movement, changes in direction, all surfaces.    Person(s) Educated Patient    Methods Explanation;Demonstration    Comprehension Verbalized understanding              PT Short Term Goals - 04/23/21 1430       PT SHORT TERM GOAL #1   Title Patient will be I with basic HEP    Baseline initiated.    Time 2    Period Weeks    Status New    Target Date 05/07/21               PT Long Term Goals - 04/23/21 1434       PT LONG TERM GOAL #1   Title I with final HEP    Time 4    Period Weeks    Status New    Target Date 05/21/21      PT LONG TERM GOAL #2   Title Increase BLE strength to at least 4/5 throughout    Baseline (3+)-4-/5    Time 4    Period Weeks    Status New    Target Date 05/21/21      PT LONG TERM GOAL #3   Title Patient will perform advanced balance actrivities, including walking on unlevel surfaces, quick turns, plyometric movements, all without LOB, I.    Time 4    Period Weeks    Status New    Target Date 05/21/21      PT LONG TERM GOAL #4   Title Patient will perform all functional activitiy, including head turns, turning trunk, sup<> sit, rolling, without any vestibular symptoms.    Baseline dizziness iwth rolling.    Time 4    Period Weeks    Status New    Target Date 05/21/21                    Plan - 04/23/21 1419     Clinical Impression Statement Patient presents after multiple medical events. She demonstrates weakness,in trunk and BLE as well as pain, and decreased ROM on L side, episodes of dizziness noted, thought to be related to her recent  parathyroid removal. However, she did report dizziness while turning from supine to R side lie and had to pause to recover. Screens for lumbar ROMand SI dysfunction were (-). She will benefit from further assessment of her high level balance, including speed of movement, coordinated control, vestibular assessment, and also for treatment for ROM of L hip and strengthening throughout core and B hips and LE.    Personal Factors and Comorbidities Past/Current Experience;Comorbidity 2    Comorbidities recent subarachnoid hemorrhage, recent removal of parathyroid gland.    Examination-Activity Limitations Bend;Locomotion Level;Squat    Examination-Participation Restrictions Occupation    Stability/Clinical Decision Making Stable/Uncomplicated    Clinical Decision Making Low    Rehab Potential Good    PT Frequency Other (comment)   1-2x/week   PT Duration 4 weeks    PT Treatment/Interventions Neuromuscular re-education;Manual techniques;Dry needling;Balance training;Therapeutic exercise;Therapeutic  activities;Functional mobility training;Stair training;Passive range of motion;Iontophoresis 4mg /ml Dexamethasone;ADLs/Self Care Home Management;Vestibular    PT Next Visit Plan Vestibular screen, high level balance screen, HEP for strength and ROM, FOTO    Consulted and Agree with Plan of Care Patient             Patient will benefit from skilled therapeutic intervention in order to improve the following deficits and impairments:  Decreased coordination, Decreased range of motion, Dizziness, Decreased endurance, Increased muscle spasms, Pain, Decreased balance, Decreased mobility, Decreased strength, Postural dysfunction, Improper body mechanics, Impaired flexibility  Visit Diagnosis: 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)  Muscle weakness (generalized)  Unsteadiness on feet  Dizziness and giddiness     Problem  List Patient Active Problem List   Diagnosis Date Noted   Hypercalcemia 04/03/2021   Hypokalemia 04/03/2021   History of subarachnoid hemorrhage 04/03/2021   Hyperparathyroidism ([facility]) 04/03/2021   SAH (subarachnoid hemorrhage) ([facility]) 03/15/2021   Mixed hyperlipidemia 06/03/2018   Prediabetes 06/03/2018   S/P laparoscopic assisted vaginal hysterectomy (LAVH) 10/01/2016   Menorrhagia 09/10/2016   Iron deficiency anemia 05/06/2016   Acquired hypothyroidism 05/24/2013   Depression with anxiety 05/24/2013    Marcelina Morel, DPT 04/23/2021, 2:39 PM  [facility]. [place], [place], [address] Phone: [phone]   Fax:  [phone]  Name: Amanda Henry MRN: [id number] Date of Birth: 1965/12/14

## 2021-04-24 ENCOUNTER — Encounter: Payer: Self-pay | Admitting: Nurse Practitioner

## 2021-04-24 NOTE — Therapy (Signed)
[facility]. [place], [place], [address] Phone: [phone]   Fax:  [phone]  Occupational Therapy Evaluation  Patient Details  Name: Amanda Henry MRN: [id number] Date of Birth: 05-Jul-1965 Referring Provider (OT): Marylu Lund, MD   Encounter Date: 04/23/2021   OT End of Session - 04/23/21 1804     Visit Number 1    Number of Visits 1    Authorization Type [employer] (copay $72)    Authorization Time Period VL: [place]    OT Start Time 1400    OT Stop Time 1500    OT Time Calculation (min) 60 min    Activity Tolerance Patient tolerated treatment well    Behavior During Therapy WFL for tasks assessed/performed            Past Medical History:  Diagnosis Date   Anxiety    Blood transfusion without reported diagnosis    Depression    Hypertension    no medications at this time   Hypothyroidism    Iron deficiency anemia    Thyroid disease     Past Surgical History:  Procedure Laterality Date   IR ANGIO INTRA EXTRACRAN SEL COM CAROTID INNOMINATE UNI L MOD SED  03/16/2021   IR ANGIO INTRA EXTRACRAN SEL COM CAROTID INNOMINATE UNI L MOD SED  03/26/2021   IR ANGIO INTRA EXTRACRAN SEL INTERNAL CAROTID UNI R MOD SED  03/15/2021   IR ANGIO INTRA EXTRACRAN SEL INTERNAL CAROTID UNI R MOD SED  03/26/2021   IR ANGIO INTRA EXTRACRAN SEL INTERNAL CAROTID UNI R MOD SED  03/25/2021   IR ANGIO VERTEBRAL SEL VERTEBRAL UNI L MOD SED  03/15/2021   IR ANGIO VERTEBRAL SEL VERTEBRAL UNI L MOD SED  03/26/2021   IR ANGIOGRAM FOLLOW UP STUDY  03/26/2021   IR ANGIOGRAM FOLLOW UP STUDY  03/26/2021   IR TRANSCATH/EMBOLIZ  03/26/2021   IR US GUIDE VASC ACCESS RIGHT  03/26/2021   LAPAROSCOPIC VAGINAL HYSTERECTOMY WITH SALPINGECTOMY Bilateral 10/01/2016   Procedure: LAPAROSCOPIC ASSISTED VAGINAL HYSTERECTOMY WITH SALPINGECTOMY;  Surgeon: Paula Compton, MD;  Location: [facility];  Service: Gynecology;  Laterality: Bilateral;   PARATHYROIDECTOMY  Right 04/05/2021   Procedure: right inferioir PARATHYROIDECTOMY;  Surgeon: Armandina Gemma, MD;  Location: [facility];  Service: General;  Laterality: Right;   RADIOLOGY WITH ANESTHESIA N/A 03/15/2021   Procedure: IR WITH ANESTHESIA;  Surgeon: Consuella Lose, MD;  Location: [facility];  Service: Radiology;  Laterality: N/A;   RADIOLOGY WITH ANESTHESIA N/A 03/26/2021   Procedure: IR WITH ANESTHESIA (Coiling and Embolization);  Surgeon: Consuella Lose, MD;  Location: [facility];  Service: Radiology;  Laterality: N/A;   WISDOM TOOTH EXTRACTION      There were no vitals filed for this visit.   Subjective Assessment - 04/23/21 1407     Subjective  Pt arrives to session w/ primary concerns regarding cognition at this time. Reports she was admitted to the hospital after a sudden severe headache when she woke up the morning of 03/15/21. Prior to this, she was being followed for possible hyperparathyroidism, and was scheduled for a thyroid US prior to her admission. When in the process of transferring to IP rehab, she ended up transferring back to the hospital and underwent a parathyroidectomy a few days later. She improved significantly and was then d/c home on 04/08/21; since then she reports things have been going fairly well at home.    Pertinent History SAH 03/15/21; R inferior parathyroidectomy on 04/05/21 due  to hyperparathyroidism    Patient Stated Goals "Getting back to being independent again;" return to driving    Currently in Pain? Yes    Pain Score 5     Pain Location Knee    Pain Orientation Left    Pain Descriptors / Indicators Aching;Tightness    Pain Type Chronic pain    Pain Onset More than a month ago    Pain Frequency Intermittent             [facility] OT Assessment - 04/23/21 1411       Assessment   Medical Diagnosis Subarachnoid hemorrhage; parathyroidectomy on 04/05/21    Referring Provider (OT) Marylu Lund, MD    Onset Date/Surgical Date 03/15/21    Hand Dominance Right     Prior Therapy Acute OT/PT/ST      Precautions   Precaution Comments No driving at this time      Balance Screen   Has the patient fallen in the past 6 months No      Home  Environment   Type of [place] to live on main level with bedroom/bathroom    Bathroom Shower/Tub Tub/Shower unit    Home Equipment Shower seat - built in    Lives With Family   Parents     Prior Function   Level of Independence Independent    Vocation Full time employment    Public librarian; currently on short-term leave    Leisure read, workout      ADL   Eating/Feeding Independent    Grooming Independent    Upper Body Bathing Modified independent   Currently participating in bathing while seated due to fatigue   Lower Body Bathing Modified independent    Upper Body Dressing Independent    Lower Body Dressing Modified independent   Needs to complete while seated   Toileting -  Hygiene Independent      IADL   Prior Level of Function Shopping Independent    Shopping Needs to be accompanied on any shopping trip   Overstimulating; can tolerate about 10 minutes   Prior Level of Function Light Housekeeping Independent    Light Housekeeping Does personal laundry completely;Performs light daily tasks such as dishwashing, bed making    Prior Level of Function Meal Prep Independent    Meal Prep Able to complete simple warm meal prep;Able to complete simple cold meal and snack prep    Prior Level of Function Scientist, research (physical sciences) Relies on family or friends for transportation    Medication Management Is responsible for taking medication in correct dosages at correct time    Physiological scientist financial matters independently (budgets, writes checks, pays rent, bills goes to [employer]), collects and keeps track of income      Vision - History   Baseline Vision Wears glasses only for reading    Patient Visual Report Eye fatigue/eye  pain/headache;Nausea/blurring vision with head movement;Unable to keep objects in focus    Additional Comments Reports needing to set up an appt w/ optometrist/ophthalmologist; OT encouraged this      Vision Assessment   Ocular Range of Motion Within Functional Limits    Tracking/Visual Pursuits Able to track stimulus in all quads without difficulty    Saccades Within functional limits    Convergence Within functional limits    Visual Fields No apparent deficits   Near and mid peripheral vision appears intact via gross assessment; potential  limitations w/ far peripheral visual field bilaterally     Activity Tolerance   Activity Tolerance Comments Reports increased generalized fatigue/decreased activity tolerance since hospitalization; pt demonstrated increased fatigue toward conclusion of session      Cognition   Overall Cognitive Status Cognition to be further assessed in functional context PRN    Area of Impairment Attention;Memory;Problem solving    Memory Decreased short-term memory    Memory Comments Delayed recall: 1/5 words; able to recall remaining 4 with verbal/contextual clues      Observation/Other Assessments   Outcome Measures Trail Making Test: Part A & B, number cancellation screen, and Clock Drawing assessment WNL      Sensation   Additional Comments No apparent deficits      Coordination   Gross Motor Movements are Fluid and Coordinated Yes    Fine Motor Movements are Fluid and Coordinated Yes      ROM / Strength   AROM / PROM / Strength AROM;Strength      AROM   Overall AROM  Within functional limits for tasks performed      Hand Function   Right Hand Gross Grasp Functional    Left Hand Gross Grasp Functional             OT Education - 04/24/21 1802     Education Details Education provided on energy conservation techniques and memory compensatory strategies w/ corresponding handouts provided    Person(s) Educated Patient    Methods Explanation;Handout     Comprehension Verbalized understanding             OT Short Term Goals - 04/23/21 1812       OT SHORT TERM GOAL #1   Title Pt will verbalize understanding of role/purpose of occupational therapy services and be able to independently identify if/when she may need to seek a new referral for services to assist w/ participation in functional activities    Status Achieved   04/23/21     OT SHORT TERM GOAL #2   Title Pt will verbalize understanding of energy conservation strategies to incorporate during IADLs to decrease impact of fatigue    Status Achieved   04/23/21            Plan - 04/23/21 1805     Clinical Impression Statement Pt is a 55 y/o female who presents to OP OT s/p SAH on 03/15/21. 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 PMH includes hyperparathyroidism w/ recent parathyroidectomy on 04/05/21, hyperthyroidism, HTN, and iron deficiency anemia. Pt did not demonstrate deficits w/ strength, vision/visual perception, or coordination. Considering this in addition to absence of reported functional deficits and referral for SLP services to address cognition, skilled OP occupational therapy services are not warranted at this time. OT discussed this with pt and she was agreeable. Pt encouraged to call back with any specific changes or development of limitations during functional activities, as well as to continue follow-up with MD prn.    OT Occupational Profile and History Detailed Assessment- Review of Records and additional review of physical, cognitive, psychosocial history related to current functional performance    Occupational performance deficits (Please refer to evaluation for details): IADL's;Work;Social Participation    Body Structure / Function / Physical Skills Endurance    Cognitive Skills Attention;Problem Solve;Memory    Clinical Decision Making Limited treatment options, no task  modification necessary    Comorbidities Affecting Occupational Performance: May  have comorbidities impacting occupational performance    Modification or Assistance to Complete Evaluation  No modification of tasks or assist necessary to complete eval    OT Frequency One time visit    OT Treatment/Interventions Self-care/ADL training;Patient/family education;Energy conservation    Plan Skilled OT services are not warranted at this time; pt encouraged to call back with any changes in functional status or limitations    Recommended Other Services SLP eval scheduled Friday 04/26/21    Consulted and Agree with Plan of Care Patient            Patient will benefit from skilled therapeutic intervention in order to improve the following deficits and impairments:   Body Structure / Function / Physical Skills: Endurance Cognitive Skills: Attention, Problem Solve, Memory   Visit Diagnosis: Unspecified symptoms and signs involving cognitive functions following nontraumatic subarachnoid hemorrhage  Muscle weakness (generalized)   Problem List Patient Active Problem List   Diagnosis Date Noted   Hypercalcemia 04/03/2021   Hypokalemia 04/03/2021   History of subarachnoid hemorrhage 04/03/2021   Hyperparathyroidism ([place]) 04/03/2021   SAH (subarachnoid hemorrhage) ([place]) 03/15/2021   Mixed hyperlipidemia 06/03/2018   Prediabetes 06/03/2018   S/P laparoscopic assisted vaginal hysterectomy (LAVH) 10/01/2016   Menorrhagia 09/10/2016   Iron deficiency anemia 05/06/2016   Acquired hypothyroidism 05/24/2013   Depression with anxiety 05/24/2013    Kathrine Cords, OTR/L, MSOT 04/23/2021, 6:40 PM  [facility]. [place], [place], [address] Phone: [phone]   Fax:  [phone]  Name: Amanda Henry MRN: [id number] Date of Birth: [birth year]

## 2021-04-26 ENCOUNTER — Encounter: Payer: Self-pay | Admitting: Speech Pathology

## 2021-04-26 ENCOUNTER — Ambulatory Visit: Payer: BC Managed Care – PPO | Admitting: Physical Therapy

## 2021-04-26 ENCOUNTER — Other Ambulatory Visit: Payer: Self-pay

## 2021-04-26 ENCOUNTER — Encounter: Payer: Self-pay | Admitting: Physical Therapy

## 2021-04-26 ENCOUNTER — Ambulatory Visit: Payer: BC Managed Care – PPO | Admitting: Speech Pathology

## 2021-04-26 DIAGNOSIS — M6281 Muscle weakness (generalized): Secondary | ICD-10-CM

## 2021-04-26 DIAGNOSIS — I69054 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: Secondary | ICD-10-CM

## 2021-04-26 DIAGNOSIS — I69019 Unspecified symptoms and signs involving cognitive functions following nontraumatic subarachnoid hemorrhage: Secondary | ICD-10-CM

## 2021-04-26 DIAGNOSIS — R41841 Cognitive communication deficit: Secondary | ICD-10-CM

## 2021-04-26 DIAGNOSIS — R2681 Unsteadiness on feet: Secondary | ICD-10-CM

## 2021-04-26 DIAGNOSIS — R42 Dizziness and giddiness: Secondary | ICD-10-CM

## 2021-04-26 NOTE — Therapy (Signed)
[facility]. [place], [place], [address] Phone: [phone]   Fax:  [phone]  Speech Language Pathology Evaluation   Patient Details  Name: Amanda Henry MRN: [id number] Date of Birth: 1965/10/13 Referring Provider (SLP): [name] FNP   Encounter Date: 04/26/2021   End of Session - 04/26/21 1132     Visit Number 1    Number of Visits 5    Date for SLP Re-Evaluation 06/27/21    SLP Start Time 0800    SLP Stop Time  0845    SLP Time Calculation (min) 45 min    Activity Tolerance Patient tolerated treatment well             Past Medical History:  Diagnosis Date   Anxiety    Blood transfusion without reported diagnosis    Depression    Hypertension    no medications at this time   Hypothyroidism    Iron deficiency anemia    Thyroid disease     Past Surgical History:  Procedure Laterality Date   IR ANGIO INTRA EXTRACRAN SEL COM CAROTID INNOMINATE UNI L MOD SED  03/16/2021   IR ANGIO INTRA EXTRACRAN SEL COM CAROTID INNOMINATE UNI L MOD SED  03/26/2021   IR ANGIO INTRA EXTRACRAN SEL INTERNAL CAROTID UNI R MOD SED  03/15/2021   IR ANGIO INTRA EXTRACRAN SEL INTERNAL CAROTID UNI R MOD SED  03/26/2021   IR ANGIO INTRA EXTRACRAN SEL INTERNAL CAROTID UNI R MOD SED  03/25/2021   IR ANGIO VERTEBRAL SEL VERTEBRAL UNI L MOD SED  03/15/2021   IR ANGIO VERTEBRAL SEL VERTEBRAL UNI L MOD SED  03/26/2021   IR ANGIOGRAM FOLLOW UP STUDY  03/26/2021   IR ANGIOGRAM FOLLOW UP STUDY  03/26/2021   IR TRANSCATH/EMBOLIZ  03/26/2021   IR US GUIDE VASC ACCESS RIGHT  03/26/2021   LAPAROSCOPIC VAGINAL HYSTERECTOMY WITH SALPINGECTOMY Bilateral 10/01/2016   Procedure: LAPAROSCOPIC ASSISTED VAGINAL HYSTERECTOMY WITH SALPINGECTOMY;  Surgeon: Paula Compton, MD;  Location: [facility];  Service: Gynecology;  Laterality: Bilateral;   PARATHYROIDECTOMY Right 04/05/2021   Procedure: right inferioir PARATHYROIDECTOMY;  Surgeon: Armandina Gemma, MD;  Location: [facility];  Service: General;  Laterality: Right;   RADIOLOGY WITH ANESTHESIA N/A 03/15/2021   Procedure: IR WITH ANESTHESIA;  Surgeon: Consuella Lose, MD;  Location: [facility];  Service: Radiology;  Laterality: N/A;   RADIOLOGY WITH ANESTHESIA N/A 03/26/2021   Procedure: IR WITH ANESTHESIA (Coiling and Embolization);  Surgeon: Consuella Lose, MD;  Location: [facility];  Service: Radiology;  Laterality: N/A;   WISDOM TOOTH EXTRACTION      There were no vitals filed for this visit.   Subjective Assessment - 04/26/21 0809     Subjective Pt was pleasant and cooperative throughout assessment.    Currently in Pain? No/denies                SLP Evaluation [facility] - 04/26/21 0809       SLP Visit Information   SLP Received On 04/26/21    Referring Provider (SLP) [name] FNP    Onset Date 03/15/21    Medical Diagnosis SAH      Subjective   Patient/Family Stated Goal To get back to work      Balance Screen   Has the patient fallen in the past 6 months No      Prior Functional Status   Cognitive/Linguistic Baseline Within functional limits    Baseline deficit details Feels like  in the last two years she felt she had a decline in her memory; no formal diagnoses.    Type of Home Other(Comment)   Townhome    Lives With Family;Other (Comment)   Living alone prior to [facility]; now living with parents.   Available Support Family    Education College    Vocation Full time employment   Teacher; [employer]     Cognition   Overall Cognitive Status Impaired/Different from baseline    Area of Impairment Attention;Memory    Memory Decreased short-term memory      Auditory Comprehension   Overall Auditory Comprehension Appears within functional limits for tasks assessed      Reading Comprehension   Reading Status Within funtional limits      Verbal Expression   Overall Verbal Expression Appears within functional limits for tasks assessed      Oral  Motor/Sensory Function   Overall Oral Motor/Sensory Function Appears within functional limits for tasks assessed      Motor Speech   Overall Motor Speech Appears within functional limits for tasks assessed      Standardized Assessments   Standardized Assessments  Cognitive Linguistic Quick Test      Cognitive Linguistic Quick Test (Ages 18-69)   Attention WNL    Memory WNL    Executive Function WNL    Language WNL    Visuospatial Skills WNL    Severity Rating Total 20    Composite Severity Rating 16.8                             SLP Education - 04/26/21 1131     Education Details Cognitive communication impairment; SLP role    Person(s) Educated Patient    Methods Demonstration;Explanation    Comprehension Verbalized understanding              SLP Short Term Goals - 04/26/21 1140       SLP SHORT TERM GOAL #1   Title Pt will increase auditory comprehension by recalling and verbalizing strategies to assist with active listening during conversation with minA.    Time 2    Period Weeks    Status New    Target Date 05/10/21      SLP SHORT TERM GOAL #2   Title Pt will comprehend functional memory or visual aids for recall of important information during structured conversations with minA.    Time 2    Period Weeks    Status New    Target Date 05/10/21      SLP SHORT TERM GOAL #3   Title Pt will recall word finding strategies for anomia in conversation with minA    Time 2    Period Weeks    Status New    Target Date 05/10/21              SLP Long Term Goals - 04/26/21 1141       SLP LONG TERM GOAL #1   Title Pt will increase auditory comprehension by recalling and verbalizing strategies to assist with active listening during unstructured conversation independently.    Time 4    Period Weeks    Status New    Target Date 05/24/21      SLP LONG TERM GOAL #2   Title Pt will demonstrate understanding and verbalize use of functional  memory/visual aids to recall important information in unstructured conversations independently.    Time 4  Period Weeks    Status New    Target Date 05/24/21      SLP LONG TERM GOAL #3   Title Pt will report successful use of word finding strategies in decreasing of instances of anomia during conversation.    Time 4    Period Weeks    Status New    Target Date 05/24/21              Plan - 04/26/21 1133     Clinical Impression Statement Pt is a 55 y/o female who presents to OP ST s/p [place] on 03/15/21. 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 PMH includes hyperparathyroidism w/ recent parathyroidectomy on 04/05/21, hyperthyroidism, HTN, and iron deficiency anemia. Pt reports premorbid difficulty with memory (that has gotten worse in the past 2 years), occasional word finding problems, and difficulty paying attention.  SLP assessed pt using the CLQT. See evaluation section for results. Pt had most difficulty with design generation and story retell which also reflect difficulty with attention, memory, and comprehension of higher-level information. Pt demonstrated difficulty with active listening skills to retain information and benefited from repetition throughout assessment. Pt goal is to return to work as a [employer] in January and be successful doing so. SLP rec skilled speech services to address impairments associated with cognitive-communication impairment 2/2 SAH to increase independence and confidence in performing tasks at work. Pt in agreement with POC. To complete PROMs next session.    Speech Therapy Frequency 1x /week    Duration 4 weeks    Treatment/Interventions Compensatory strategies;Cueing hierarchy;Functional tasks;Patient/family education;Environmental controls;Cognitive reorganization;Compensatory techniques;Internal/external aids;Language facilitation;Multimodal communcation approach     Potential to Achieve Goals Good    Consulted and Agree with Plan of Care Patient             Patient will benefit from skilled therapeutic intervention in order to improve the following deficits and impairments:   Cognitive communication deficit    Problem List Patient Active Problem List   Diagnosis Date Noted   Hypercalcemia 04/03/2021   Hypokalemia 04/03/2021   History of subarachnoid hemorrhage 04/03/2021   Hyperparathyroidism ([place] [facility]) 04/03/2021   SAH (subarachnoid hemorrhage) ([facility]) 03/15/2021   Mixed hyperlipidemia 06/03/2018   Prediabetes 06/03/2018   S/P laparoscopic assisted vaginal hysterectomy (LAVH) 10/01/2016   Menorrhagia 09/10/2016   Iron deficiency anemia 05/06/2016   Acquired hypothyroidism 05/24/2013   Depression with anxiety 05/24/2013    Verdene Lennert, CCC-SLP 04/26/2021, 11:43 AM  [facility]. [place], [place], [address] Phone: [phone]   Fax:  [phone]  Name: Stellar Gensel MRN: [id number] Date of Birth: Jul 19, 1965

## 2021-04-26 NOTE — Therapy (Signed)
[facility]. [place], [place], [address] Phone: [phone]   Fax:  [phone]  Physical Therapy Treatment  Patient Details  Name: Amanda Henry MRN: [id number] Date of Birth: [birth year] Referring Provider (PT): Doreene Burke Moore/Christine Gherkin   Encounter Date: 04/26/2021   PT End of Session - 04/26/21 1200     Visit Number 2    Number of Visits 8    Date for PT Re-Evaluation 05/21/21    PT Start Time 0845    PT Stop Time 0928    PT Time Calculation (min) 43 min    Activity Tolerance Patient tolerated treatment well    Behavior During Therapy WFL for tasks assessed/performed             Past Medical History:  Diagnosis Date   Anxiety    Blood transfusion without reported diagnosis    Depression    Hypertension    no medications at this time   Hypothyroidism    Iron deficiency anemia    Thyroid disease     Past Surgical History:  Procedure Laterality Date   IR ANGIO INTRA EXTRACRAN SEL COM CAROTID INNOMINATE UNI L MOD SED  03/16/2021   IR ANGIO INTRA EXTRACRAN SEL COM CAROTID INNOMINATE UNI L MOD SED  03/26/2021   IR ANGIO INTRA EXTRACRAN SEL INTERNAL CAROTID UNI R MOD SED  03/15/2021   IR ANGIO INTRA EXTRACRAN SEL INTERNAL CAROTID UNI R MOD SED  03/26/2021   IR ANGIO INTRA EXTRACRAN SEL INTERNAL CAROTID UNI R MOD SED  03/25/2021   IR ANGIO VERTEBRAL SEL VERTEBRAL UNI L MOD SED  03/15/2021   IR ANGIO VERTEBRAL SEL VERTEBRAL UNI L MOD SED  03/26/2021   IR ANGIOGRAM FOLLOW UP STUDY  03/26/2021   IR ANGIOGRAM FOLLOW UP STUDY  03/26/2021   IR TRANSCATH/EMBOLIZ  03/26/2021   IR US GUIDE VASC ACCESS RIGHT  03/26/2021   LAPAROSCOPIC VAGINAL HYSTERECTOMY WITH SALPINGECTOMY Bilateral 10/01/2016   Procedure: LAPAROSCOPIC ASSISTED VAGINAL HYSTERECTOMY WITH SALPINGECTOMY;  Surgeon: Paula Compton, MD;  Location: [facility];  Service: Gynecology;  Laterality: Bilateral;   PARATHYROIDECTOMY Right 04/05/2021    Procedure: right inferioir PARATHYROIDECTOMY;  Surgeon: Armandina Gemma, MD;  Location: [facility];  Service: General;  Laterality: Right;   RADIOLOGY WITH ANESTHESIA N/A 03/15/2021   Procedure: IR WITH ANESTHESIA;  Surgeon: Consuella Lose, MD;  Location: [facility];  Service: Radiology;  Laterality: N/A;   RADIOLOGY WITH ANESTHESIA N/A 03/26/2021   Procedure: IR WITH ANESTHESIA (Coiling and Embolization);  Surgeon: Consuella Lose, MD;  Location: [facility];  Service: Radiology;  Laterality: N/A;   WISDOM TOOTH EXTRACTION      There were no vitals filed for this visit.   Subjective Assessment - 04/26/21 0847     Subjective Patient reports additional dizziness episodes, occuring while walking fast. She took Meclizine, which resolved the dizziness. Base of neck to mid spine has been sore.    Currently in Pain? Yes    Pain Score 7     Pain Location Back    Pain Orientation Right;Left;Mid;Medial    Pain Descriptors / Indicators Aching;Sore    Pain Type Acute pain    Pain Onset In the past 7 days    Pain Frequency Intermittent    Aggravating Factors  lying on her back    Multiple Pain Sites No                [facility] PT Assessment - 04/26/21 0001  Balance   Balance Assessed Yes      Standardized Balance Assessment   Standardized Balance Assessment Vestibular Evaluation                 Vestibular Assessment - 04/26/21 0001       Vestibular Assessment   General Observation Patient reports episodes of dizziness with fast walking and sometimes in the car. She says she cannot look nout the side window.      Symptom Behavior   Subjective history of current problem Recent H/O SAH with onset of dizziness afterwards.    Frequency of Dizziness Intermittant with fast walking or head turns.    Aggravating Factors Turning body quickly;Turning head quickly;Turning head sideways;Sitting in moving car      Oculomotor Exam   Ocular ROM WNL    Smooth Pursuits Saccades       Vestibulo-Ocular Reflex   VOR 1 Head Only (x 1 viewing) dizziness increaed to 2 with horizontal assessment (-) vertical    VOR 2 Head and Object (x 2 viewing) (-)      Positional Testing   Dix-Hallpike Dix-Hallpike Right;Dix-Hallpike Left      Dix-Hallpike Right   Dix-Hallpike Right Duration (-)      Dix-Hallpike Left   Dix-Hallpike Left Duration 1 minute    Dix-Hallpike Left Symptoms Upbeat Nystagmus                      [facility] Adult PT Treatment/Exercise - 04/26/21 0001       Exercises   Exercises Lumbar      Lumbar Exercises: Seated   Other Seated Lumbar Exercises thoracic flexion/ext             Vestibular Treatment/Exercise - 04/26/21 0001       Vestibular Treatment/Exercise   Vestibular Treatment Provided Canalith Repositioning                    PT Education - 04/26/21 1159     Education Details Results of vestibular exam. Precaustions to follow after canalith repositioning.    Person(s) Educated Patient    Methods Explanation    Comprehension Verbalized understanding              PT Short Term Goals - 04/23/21 1430       PT SHORT TERM GOAL #1   Title Patient will be I with basic HEP    Baseline initiated.    Time 2    Period Weeks    Status New    Target Date 05/07/21               PT Long Term Goals - 04/23/21 1434       PT LONG TERM GOAL #1   Title I with final HEP    Time 4    Period Weeks    Status New    Target Date 05/21/21      PT LONG TERM GOAL #2   Title Increase BLE strength to at least 4/5 throughout    Baseline (3+)-4-/5    Time 4    Period Weeks    Status New    Target Date 05/21/21      PT LONG TERM GOAL #3   Title Patient will perform advanced balance actrivities, including walking on unlevel surfaces, quick turns, plyometric movements, all without LOB, I.    Time 4    Period Weeks    Status New    Target Date  05/21/21      PT LONG TERM GOAL #4   Title Patient will perform all  functional activitiy, including head turns, turning trunk, sup<> sit, rolling, without any vestibular symptoms.    Baseline dizziness iwth rolling.    Time 4    Period Weeks    Status New    Target Date 05/21/21                   Plan - 04/26/21 1200     Clinical Impression Statement Patient reported continued vestibular symptoms, with fast walking and head turns. Vestibular exam led to canalith repositioning for L ant/post correction. Patient also C/O thoracic pain. Plan to continue to progress with high level balance, F/U with dizziness.    Personal Factors and Comorbidities Past/Current Experience;Comorbidity 2    Comorbidities recent subarachnoid hemorrhage, recent removal of parathyroid gland.    Examination-Activity Limitations Bend;Locomotion Level;Squat    Examination-Participation Restrictions Occupation    Stability/Clinical Decision Making Stable/Uncomplicated    Clinical Decision Making Low    Rehab Potential Good    PT Frequency Other (comment)   1-2x/week   PT Duration 4 weeks    PT Treatment/Interventions Neuromuscular re-education;Manual techniques;Dry needling;Balance training;Therapeutic exercise;Therapeutic activities;Functional mobility training;Stair training;Passive range of motion;Iontophoresis 4mg /ml Dexamethasone;ADLs/Self Care Home Management;Vestibular    PT Next Visit Plan Vestibular F/U, high level balance screen, HEP for strength and ROM, FOTO    Consulted and Agree with Plan of Care Patient             Patient will benefit from skilled therapeutic intervention in order to improve the following deficits and impairments:  Decreased coordination, Decreased range of motion, Dizziness, Decreased endurance, Increased muscle spasms, Pain, Decreased balance, Decreased mobility, Decreased strength, Postural dysfunction, Improper body mechanics, Impaired flexibility  Visit Diagnosis: Unspecified symptoms and signs involving cognitive functions following  nontraumatic subarachnoid hemorrhage  Muscle weakness (generalized)  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)  Unsteadiness on feet  Dizziness and giddiness     Problem List Patient Active Problem List   Diagnosis Date Noted   Hypercalcemia 04/03/2021   Hypokalemia 04/03/2021   History of subarachnoid hemorrhage 04/03/2021   Hyperparathyroidism ([facility]) 04/03/2021   SAH (subarachnoid hemorrhage) ([facility]) 03/15/2021   Mixed hyperlipidemia 06/03/2018   Prediabetes 06/03/2018   S/P laparoscopic assisted vaginal hysterectomy (LAVH) 10/01/2016   Menorrhagia 09/10/2016   Iron deficiency anemia 05/06/2016   Acquired hypothyroidism 05/24/2013   Depression with anxiety 05/24/2013    Amanda Henry, DPT 04/26/2021, 12:07 PM  [facility]. [place], [place], [address] Phone: [phone]   Fax:  [phone]  Name: Amanda Henry MRN: [id number] Date of Birth: [birth year]

## 2021-04-29 ENCOUNTER — Encounter: Payer: BC Managed Care – PPO | Admitting: Nurse Practitioner

## 2021-05-01 ENCOUNTER — Ambulatory Visit: Payer: BC Managed Care – PPO | Admitting: Speech Pathology

## 2021-05-01 ENCOUNTER — Other Ambulatory Visit: Payer: Self-pay

## 2021-05-01 ENCOUNTER — Ambulatory Visit: Payer: BC Managed Care – PPO | Admitting: Physical Therapy

## 2021-05-01 ENCOUNTER — Encounter: Payer: Self-pay | Admitting: Physical Therapy

## 2021-05-01 ENCOUNTER — Encounter: Payer: BC Managed Care – PPO | Admitting: Nurse Practitioner

## 2021-05-01 DIAGNOSIS — M6281 Muscle weakness (generalized): Secondary | ICD-10-CM

## 2021-05-01 DIAGNOSIS — R2681 Unsteadiness on feet: Secondary | ICD-10-CM

## 2021-05-01 DIAGNOSIS — R42 Dizziness and giddiness: Secondary | ICD-10-CM

## 2021-05-01 DIAGNOSIS — E892 Postprocedural hypoparathyroidism: Secondary | ICD-10-CM | POA: Insufficient documentation

## 2021-05-01 DIAGNOSIS — I69054 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: Secondary | ICD-10-CM | POA: Diagnosis not present

## 2021-05-01 NOTE — Therapy (Signed)
[facility]. [place], [place], [address] Phone: [phone]   Fax:  [phone]  Physical Therapy Treatment  Patient Details  Name: Timothea Bodenheimer MRN: [id number] Date of Birth: Oct 30, 1965 Referring Provider (PT): Doreene Burke Moore/Christine Gherkin   Encounter Date: 05/01/2021   PT End of Session - 05/01/21 0906     Visit Number 3    Number of Visits 8    Date for PT Re-Evaluation 05/21/21    PT Start Time 0807    PT Stop Time 0850    PT Time Calculation (min) 43 min    Activity Tolerance Patient tolerated treatment well    Behavior During Therapy WFL for tasks assessed/performed             Past Medical History:  Diagnosis Date   Anxiety    Blood transfusion without reported diagnosis    Depression    Hypertension    no medications at this time   Hypothyroidism    Iron deficiency anemia    Thyroid disease     Past Surgical History:  Procedure Laterality Date   IR ANGIO INTRA EXTRACRAN SEL COM CAROTID INNOMINATE UNI L MOD SED  03/16/2021   IR ANGIO INTRA EXTRACRAN SEL COM CAROTID INNOMINATE UNI L MOD SED  03/26/2021   IR ANGIO INTRA EXTRACRAN SEL INTERNAL CAROTID UNI R MOD SED  03/15/2021   IR ANGIO INTRA EXTRACRAN SEL INTERNAL CAROTID UNI R MOD SED  03/26/2021   IR ANGIO INTRA EXTRACRAN SEL INTERNAL CAROTID UNI R MOD SED  03/25/2021   IR ANGIO VERTEBRAL SEL VERTEBRAL UNI L MOD SED  03/15/2021   IR ANGIO VERTEBRAL SEL VERTEBRAL UNI L MOD SED  03/26/2021   IR ANGIOGRAM FOLLOW UP STUDY  03/26/2021   IR ANGIOGRAM FOLLOW UP STUDY  03/26/2021   IR TRANSCATH/EMBOLIZ  03/26/2021   IR US GUIDE VASC ACCESS RIGHT  03/26/2021   LAPAROSCOPIC VAGINAL HYSTERECTOMY WITH SALPINGECTOMY Bilateral 10/01/2016   Procedure: LAPAROSCOPIC ASSISTED VAGINAL HYSTERECTOMY WITH SALPINGECTOMY;  Surgeon: Paula Compton, MD;  Location: [facility];  Service: Gynecology;  Laterality: Bilateral;   PARATHYROIDECTOMY Right 04/05/2021    Procedure: right inferioir PARATHYROIDECTOMY;  Surgeon: Armandina Gemma, MD;  Location: [facility];  Service: General;  Laterality: Right;   RADIOLOGY WITH ANESTHESIA N/A 03/15/2021   Procedure: IR WITH ANESTHESIA;  Surgeon: Consuella Lose, MD;  Location: [facility];  Service: Radiology;  Laterality: N/A;   RADIOLOGY WITH ANESTHESIA N/A 03/26/2021   Procedure: IR WITH ANESTHESIA (Coiling and Embolization);  Surgeon: Consuella Lose, MD;  Location: [facility];  Service: Radiology;  Laterality: N/A;   WISDOM TOOTH EXTRACTION      There were no vitals filed for this visit.   Subjective Assessment - 05/01/21 0808     Subjective I felt better after the vestibular work last time- I still feel it some times tho but I can control it better. I still have to be careful, I can still get woozy sometimes especially when I have to turn a lot.    Pertinent History Parathyroid surgery. H/O sciatica    Patient Stated Goals Return to her normal level of activity, drive, live I.    Currently in Pain? No/denies                [facility] PT Assessment - 05/01/21 0001       Standardized Balance Assessment   Standardized Balance Assessment Dynamic Gait Index      Dynamic Gait Index  Level Surface Normal    Change in Gait Speed Normal    Gait with Horizontal Head Turns Mild Impairment    Gait with Vertical Head Turns Normal    Gait and Pivot Turn Moderate Impairment    Step Over Obstacle Normal    Step Around Obstacles Normal    Steps Normal    Total Score 21                           [facility] Adult PT Treatment/Exercise - 05/01/21 0001       Lumbar Exercises: Standing   Other Standing Lumbar Exercises forward and sideways step ups 1x10 B 4 inch step    Other Standing Lumbar Exercises hip hikes 1x10 B      Lumbar Exercises: Supine   Bridge 10 reps    Bridge Limitations 3 second holds             Vestibular Treatment/Exercise - 05/01/21 0001       Vestibular Treatment/Exercise    Vestibular Treatment Provided Canalith Repositioning    Canalith Repositioning Epley Manuever Left    Gaze Exercises X1 Viewing Horizontal       EPLEY MANUEVER LEFT   Number of Reps  1    Overall Response  Improved Symptoms      X1 Viewing Horizontal   Comments VOR horizontal exercises                    PT Education - 05/01/21 0905     Education Details reviewed Eply maneuver, discussed implications of VOR findings and ways PT can address, updated HEP; importance of ruling out OH BP especially having started new BP medication recently and having dizziness with positional changes    Person(s) Educated Patient    Methods Explanation;Demonstration;Handout    Comprehension Verbalized understanding;Returned demonstration              PT Short Term Goals - 04/23/21 1430       PT SHORT TERM GOAL #1   Title Patient will be I with basic HEP    Baseline initiated.    Time 2    Period Weeks    Status New    Target Date 05/07/21               PT Long Term Goals - 04/23/21 1434       PT LONG TERM GOAL #1   Title I with final HEP    Time 4    Period Weeks    Status New    Target Date 05/21/21      PT LONG TERM GOAL #2   Title Increase BLE strength to at least 4/5 throughout    Baseline (3+)-4-/5    Time 4    Period Weeks    Status New    Target Date 05/21/21      PT LONG TERM GOAL #3   Title Patient will perform advanced balance actrivities, including walking on unlevel surfaces, quick turns, plyometric movements, all without LOB, I.    Time 4    Period Weeks    Status New    Target Date 05/21/21      PT LONG TERM GOAL #4   Title Patient will perform all functional activitiy, including head turns, turning trunk, sup<> sit, rolling, without any vestibular symptoms.    Baseline dizziness iwth rolling.    Time 4    Period Weeks  Status New    Target Date 05/21/21                   Plan - 05/01/21 0906     Clinical Impression  Statement Ms. Strayer arrives today doing well, has felt much better since doing L Epley last session but still does have moments of dizziness. We did do L Epley x1 with some improvement in symptoms, also assessed VOR which is severely impaired, practiced and assigned basic horizontal VOR exercises today as well. Introduced some functional strengthening and assigned HEP including Epley, VOR basic exercise, and strengthening today. Did have some dizziness with positional changes, reports she started a new BP med about 3 days ago so I took orthostatics as follows:   BP supine 148/87 HR 61    BP sitting 154/101 HR 66  BP standing 158/102 HR 103    BP standing x3 154/103 HR 70      Had more dizziness and L leaning at EOS, I suspect likely from VOR impairment more than BPPV- strongly recommend continuation of skilled PT services to address.    Personal Factors and Comorbidities Past/Current Experience;Comorbidity 2    Comorbidities recent subarachnoid hemorrhage, recent removal of parathyroid gland.    Examination-Activity Limitations Bend;Locomotion Level;Squat    Examination-Participation Restrictions Occupation    Stability/Clinical Decision Making Stable/Uncomplicated    Clinical Decision Making Low    Rehab Potential Good    PT Frequency Other (comment)   1-2x/week   PT Duration 4 weeks    PT Treatment/Interventions Neuromuscular re-education;Manual techniques;Dry needling;Balance training;Therapeutic exercise;Therapeutic activities;Functional mobility training;Stair training;Passive range of motion;Iontophoresis 4mg /ml Dexamethasone;ADLs/Self Care Home Management;Vestibular    PT Next Visit Plan Epley PRN, work on VOR exercises, strength and high level balance    PT Home Exercise Plan [email or web address]    Consulted and Agree with Plan of Care Patient             Patient will benefit from skilled therapeutic intervention in order to improve the following deficits and impairments:  Decreased  coordination, Decreased range of motion, Dizziness, Decreased endurance, Increased muscle spasms, Pain, Decreased balance, Decreased mobility, Decreased strength, Postural dysfunction, Improper body mechanics, Impaired flexibility  Visit Diagnosis: Muscle weakness (generalized)  Unsteadiness on feet  Dizziness and giddiness     Problem List Patient Active Problem List   Diagnosis Date Noted   Hypercalcemia 04/03/2021   Hypokalemia 04/03/2021   History of subarachnoid hemorrhage 04/03/2021   Hyperparathyroidism ([employer]) 04/03/2021   SAH (subarachnoid hemorrhage) ([facility]) 03/15/2021   Mixed hyperlipidemia 06/03/2018   Prediabetes 06/03/2018   S/P laparoscopic assisted vaginal hysterectomy (LAVH) 10/01/2016   Menorrhagia 09/10/2016   Iron deficiency anemia 05/06/2016   Acquired hypothyroidism 05/24/2013   Depression with anxiety 05/24/2013   Ann Lions PT, DPT, PN2   Supplemental Physical Therapist [facility]    Pager [phone] Acute Rehab Office [facility]. [place], [place], [address] Phone: [phone]   Fax:  [phone]  Name: Tesla Keeler MRN: [id number] Date of Birth: [birth year]

## 2021-05-02 ENCOUNTER — Encounter: Payer: BC Managed Care – PPO | Admitting: Nurse Practitioner

## 2021-05-07 ENCOUNTER — Encounter: Payer: Self-pay | Admitting: Physical Therapy

## 2021-05-07 ENCOUNTER — Other Ambulatory Visit: Payer: Self-pay

## 2021-05-07 ENCOUNTER — Encounter: Payer: Self-pay | Admitting: Speech Pathology

## 2021-05-07 ENCOUNTER — Ambulatory Visit: Payer: BC Managed Care – PPO | Admitting: Speech Pathology

## 2021-05-07 ENCOUNTER — Ambulatory Visit: Payer: BC Managed Care – PPO | Admitting: Physical Therapy

## 2021-05-07 DIAGNOSIS — M6281 Muscle weakness (generalized): Secondary | ICD-10-CM

## 2021-05-07 DIAGNOSIS — R41841 Cognitive communication deficit: Secondary | ICD-10-CM

## 2021-05-07 DIAGNOSIS — R42 Dizziness and giddiness: Secondary | ICD-10-CM

## 2021-05-07 DIAGNOSIS — I69019 Unspecified symptoms and signs involving cognitive functions following nontraumatic subarachnoid hemorrhage: Secondary | ICD-10-CM

## 2021-05-07 DIAGNOSIS — I69054 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: Secondary | ICD-10-CM

## 2021-05-07 DIAGNOSIS — R2681 Unsteadiness on feet: Secondary | ICD-10-CM

## 2021-05-07 NOTE — Therapy (Signed)
[facility]. [place], [place], [address] Phone: [phone]   Fax:  [phone]  Speech Language Pathology Treatment  Patient Details  Name: Amanda Henry MRN: [id number] Date of Birth: 04-11-1966 Referring Provider (SLP): [name] FNP   Encounter Date: 05/07/2021   End of Session - 05/07/21 1014     Visit Number 2    Number of Visits 5    Date for SLP Re-Evaluation 06/27/21    SLP Start Time 0845    SLP Stop Time  0928    SLP Time Calculation (min) 43 min    Activity Tolerance Patient tolerated treatment well             Past Medical History:  Diagnosis Date   Anxiety    Blood transfusion without reported diagnosis    Depression    Hypertension    no medications at this time   Hypothyroidism    Iron deficiency anemia    Thyroid disease     Past Surgical History:  Procedure Laterality Date   IR ANGIO INTRA EXTRACRAN SEL COM CAROTID INNOMINATE UNI L MOD SED  03/16/2021   IR ANGIO INTRA EXTRACRAN SEL COM CAROTID INNOMINATE UNI L MOD SED  03/26/2021   IR ANGIO INTRA EXTRACRAN SEL INTERNAL CAROTID UNI R MOD SED  03/15/2021   IR ANGIO INTRA EXTRACRAN SEL INTERNAL CAROTID UNI R MOD SED  03/26/2021   IR ANGIO INTRA EXTRACRAN SEL INTERNAL CAROTID UNI R MOD SED  03/25/2021   IR ANGIO VERTEBRAL SEL VERTEBRAL UNI L MOD SED  03/15/2021   IR ANGIO VERTEBRAL SEL VERTEBRAL UNI L MOD SED  03/26/2021   IR ANGIOGRAM FOLLOW UP STUDY  03/26/2021   IR ANGIOGRAM FOLLOW UP STUDY  03/26/2021   IR TRANSCATH/EMBOLIZ  03/26/2021   IR US GUIDE VASC ACCESS RIGHT  03/26/2021   LAPAROSCOPIC VAGINAL HYSTERECTOMY WITH SALPINGECTOMY Bilateral 10/01/2016   Procedure: LAPAROSCOPIC ASSISTED VAGINAL HYSTERECTOMY WITH SALPINGECTOMY;  Surgeon: Paula Compton, MD;  Location: [facility];  Service: Gynecology;  Laterality: Bilateral;   PARATHYROIDECTOMY Right 04/05/2021   Procedure: right inferioir PARATHYROIDECTOMY;  Surgeon: Armandina Gemma, MD;  Location: [facility];  Service: General;  Laterality: Right;   RADIOLOGY WITH ANESTHESIA N/A 03/15/2021   Procedure: IR WITH ANESTHESIA;  Surgeon: Consuella Lose, MD;  Location: [facility];  Service: Radiology;  Laterality: N/A;   RADIOLOGY WITH ANESTHESIA N/A 03/26/2021   Procedure: IR WITH ANESTHESIA (Coiling and Embolization);  Surgeon: Consuella Lose, MD;  Location: [facility];  Service: Radiology;  Laterality: N/A;   WISDOM TOOTH EXTRACTION      There were no vitals filed for this visit.          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 Pt reports she will share information with her family. Began training  of memory strategies to increase comprehension of auditory information. Discussed making lists, getting a journal, and pt to bring in medication next session to discuss organization of thoughts and ideas.      Assessment / Recommendations / Plan   Plan Continue with current plan of care      Progression Toward Goals   Progression toward goals Progressing toward goals  SLP Short Term Goals - 04/26/21 1140       SLP SHORT TERM GOAL #1   Title Pt will increase auditory comprehension by recalling and verbalizing strategies to assist with active listening during conversation with minA.    Time 2    Period Weeks    Status New    Target Date 05/10/21      SLP SHORT TERM GOAL #2   Title Pt will comprehend functional memory or visual aids for recall of important information during structured conversations with minA.    Time 2    Period Weeks    Status New    Target Date 05/10/21      SLP SHORT TERM GOAL #3   Title Pt will recall word finding strategies for anomia in conversation with  minA    Time 2    Period Weeks    Status New    Target Date 05/10/21              SLP Long Term Goals - 04/26/21 1141       SLP LONG TERM GOAL #1   Title Pt will increase auditory comprehension by recalling and verbalizing strategies to assist with active listening during unstructured conversation independently.    Time 4    Period Weeks    Status New    Target Date 05/24/21      SLP LONG TERM GOAL #2   Title Pt will demonstrate understanding and verbalize use of functional memory/visual aids to recall important information in unstructured conversations independently.    Time 4    Period Weeks    Status New    Target Date 05/24/21      SLP LONG TERM GOAL #3   Title Pt will report successful use of word finding strategies in decreasing of instances of anomia during conversation.    Time 4    Period Weeks    Status New    Target Date 05/24/21              Plan - 05/07/21 1015     Clinical Impression Statement See tx note Pt in agreement with POC. To complete PROMs next session.    Speech Therapy Frequency 1x /week    Duration 4 weeks    Treatment/Interventions Compensatory strategies;Cueing hierarchy;Functional tasks;Patient/family education;Environmental controls;Cognitive reorganization;Compensatory techniques;Internal/external aids;Language facilitation;Multimodal communcation approach    Potential to Achieve Goals Good    Consulted and Agree with Plan of Care Patient             Patient will benefit from skilled therapeutic intervention in order to improve the following deficits and impairments:   Cognitive communication deficit    Problem List Patient Active Problem List   Diagnosis Date Noted   Hypercalcemia 04/03/2021   Hypokalemia 04/03/2021   History of subarachnoid hemorrhage 04/03/2021   Hyperparathyroidism ([place]) 04/03/2021   SAH (subarachnoid hemorrhage) ([place]) 03/15/2021   Mixed hyperlipidemia 06/03/2018   Prediabetes 06/03/2018   S/P  laparoscopic assisted vaginal hysterectomy (LAVH) 10/01/2016   Menorrhagia 09/10/2016   Iron deficiency anemia 05/06/2016   Acquired hypothyroidism 05/24/2013   Depression with anxiety 05/24/2013    Verdene Lennert, CCC-SLP 05/07/2021, 12:52 PM  [facility]. [place], [place], [address] Phone: [phone]   Fax:  [phone]   Name: Amanda Henry MRN: [id number] Date of Birth: [birth year]

## 2021-05-07 NOTE — Patient Instructions (Signed)
Access Code: [email or web address] URL: [email or web address] Date: 05/07/2021 Prepared by: Ethel Rana  Exercises Self-Epley Maneuver Left Ear - 1 x daily - 7 x weekly - 1 sets - 1-2 reps Seated Gaze Stabilization with Head Rotation - 2 x daily - 7 x weekly - 1 sets - 2 reps - 120-180 hold Supine Bridge - 2 x daily - 7 x weekly - 1 sets - 10-15 reps - 3 hold Standing Hip Hiking - 2 x daily - 7 x weekly - 1 sets - 10-15 reps - 1 hold Shoulder Extension with Resistance - 1 x daily - 7 x weekly - 2 sets - 10 reps Standing Bilateral Low Shoulder Row with Anchored Resistance - 1 x daily - 7 x weekly - 2 sets - 10 reps Deadlift with Resistance - 1 x daily - 7 x weekly - 2 sets - 10 reps

## 2021-05-07 NOTE — Therapy (Signed)
[place]. [place], [place], [address] Phone: [phone]   Fax:  [phone]  Physical Therapy Treatment  Patient Details  Name: Amanda Henry MRN: [id number] Date of Birth: [birth year] Referring Provider (PT): Doreene Burke Moore/Christine Gherkin   Encounter Date: 05/07/2021   PT End of Session - 05/07/21 1012     Visit Number 4    Number of Visits 8    Date for PT Re-Evaluation 05/21/21    PT Start Time 0934    PT Stop Time 1009    PT Time Calculation (min) 35 min    Activity Tolerance Patient tolerated treatment well    Behavior During Therapy WFL for tasks assessed/performed             Past Medical History:  Diagnosis Date   Anxiety    Blood transfusion without reported diagnosis    Depression    Hypertension    no medications at this time   Hypothyroidism    Iron deficiency anemia    Thyroid disease     Past Surgical History:  Procedure Laterality Date   IR ANGIO INTRA EXTRACRAN SEL COM CAROTID INNOMINATE UNI L MOD SED  03/16/2021   IR ANGIO INTRA EXTRACRAN SEL COM CAROTID INNOMINATE UNI L MOD SED  03/26/2021   IR ANGIO INTRA EXTRACRAN SEL INTERNAL CAROTID UNI R MOD SED  03/15/2021   IR ANGIO INTRA EXTRACRAN SEL INTERNAL CAROTID UNI R MOD SED  03/26/2021   IR ANGIO INTRA EXTRACRAN SEL INTERNAL CAROTID UNI R MOD SED  03/25/2021   IR ANGIO VERTEBRAL SEL VERTEBRAL UNI L MOD SED  03/15/2021   IR ANGIO VERTEBRAL SEL VERTEBRAL UNI L MOD SED  03/26/2021   IR ANGIOGRAM FOLLOW UP STUDY  03/26/2021   IR ANGIOGRAM FOLLOW UP STUDY  03/26/2021   IR TRANSCATH/EMBOLIZ  03/26/2021   IR US GUIDE VASC ACCESS RIGHT  03/26/2021   LAPAROSCOPIC VAGINAL HYSTERECTOMY WITH SALPINGECTOMY Bilateral 10/01/2016   Procedure: LAPAROSCOPIC ASSISTED VAGINAL HYSTERECTOMY WITH SALPINGECTOMY;  Surgeon: Paula Compton, MD;  Location: [facility];  Service: Gynecology;  Laterality: Bilateral;   PARATHYROIDECTOMY Right 04/05/2021    Procedure: right inferioir PARATHYROIDECTOMY;  Surgeon: Armandina Gemma, MD;  Location: [facility];  Service: General;  Laterality: Right;   RADIOLOGY WITH ANESTHESIA N/A 03/15/2021   Procedure: IR WITH ANESTHESIA;  Surgeon: Consuella Lose, MD;  Location: [facility];  Service: Radiology;  Laterality: N/A;   RADIOLOGY WITH ANESTHESIA N/A 03/26/2021   Procedure: IR WITH ANESTHESIA (Coiling and Embolization);  Surgeon: Consuella Lose, MD;  Location: [facility];  Service: Radiology;  Laterality: N/A;   WISDOM TOOTH EXTRACTION      There were no vitals filed for this visit.   Subjective Assessment - 05/07/21 0939     Subjective The dizziness is much better. She seems to notice it more in the morning. It improves after taking BP meds. she is noticing much less dizziness iwth turns.    Pertinent History Parathyroid surgery. H/O sciatica    Currently in Pain? No/denies                               [facility] Adult PT Treatment/Exercise - 05/07/21 0001       Ambulation/Gait   Ambulation/Gait Yes    Gait Comments ambulated 1 x 150' with lateral had turns throughout as well as other visual disractions, no c/O dizziness.      Exercises  Exercises Shoulder      Lumbar Exercises: Aerobic   Nustep L5 x 5 minutes      Lumbar Exercises: Standing   Other Standing Lumbar Exercises dead lifts iwth red T band resistance, 2 x 10 resps      Shoulder Exercises: Standing   External Rotation Strengthening;Both;20 reps;Theraband    Theraband Level (Shoulder External Rotation) Level 2 (Red)    Extension Strengthening;Both;20 reps;Theraband    Theraband Level (Shoulder Extension) Level 2 (Red)    Row Strengthening;Both;20 reps;Theraband    Theraband Level (Shoulder Row) Level 2 (Red)                     PT Education - 05/07/21 1007     Education Details Updated HEp to include upper body strengthening, dead lifts for strength and vestibular challenge.    Person(s) Educated Patient     Methods Explanation;Handout;Demonstration    Comprehension Verbalized understanding;Returned demonstration              PT Short Term Goals - 05/07/21 1209       PT SHORT TERM GOAL #1   Title Patient will be I with basic HEP    Baseline initiated.    Time 1    Period Weeks    Status On-going    Target Date 05/07/21               PT Long Term Goals - 04/23/21 1434       PT LONG TERM GOAL #1   Title I with final HEP    Time 4    Period Weeks    Status New    Target Date 05/21/21      PT LONG TERM GOAL #2   Title Increase BLE strength to at least 4/5 throughout    Baseline (3+)-4-/5    Time 4    Period Weeks    Status New    Target Date 05/21/21      PT LONG TERM GOAL #3   Title Patient will perform advanced balance actrivities, including walking on unlevel surfaces, quick turns, plyometric movements, all without LOB, I.    Time 4    Period Weeks    Status New    Target Date 05/21/21      PT LONG TERM GOAL #4   Title Patient will perform all functional activitiy, including head turns, turning trunk, sup<> sit, rolling, without any vestibular symptoms.    Baseline dizziness iwth rolling.    Time 4    Period Weeks    Status New    Target Date 05/21/21                   Plan - 05/07/21 1207     Clinical Impression Statement Patient reports continued improvement with her dizziness. She is also progressing with her strength and her balance. Updated HEP, performed ambulation with head turns iwht no escalation of her dizziness.    Personal Factors and Comorbidities Past/Current Experience;Comorbidity 2    Comorbidities recent subarachnoid hemorrhage, recent removal of parathyroid gland.    Examination-Activity Limitations Bend;Locomotion Level;Squat    Examination-Participation Restrictions Occupation    Stability/Clinical Decision Making Stable/Uncomplicated    Clinical Decision Making Low    Rehab Potential Good    PT Frequency Other  (comment)   1-2x/week   PT Duration 3 weeks    PT Treatment/Interventions Neuromuscular re-education;Manual techniques;Dry needling;Balance training;Therapeutic exercise;Therapeutic activities;Functional mobility training;Stair training;Passive range of motion;Iontophoresis 4mg /ml Dexamethasone;ADLs/Self  Care Home Management;Vestibular    PT Next Visit Plan [place] PRN, work on VOR exercises, strength and high level balance    PT Home Exercise Plan [id number]    Consulted and Agree with Plan of Care Patient             Patient will benefit from skilled therapeutic intervention in order to improve the following deficits and impairments:  Decreased coordination, Decreased range of motion, Dizziness, Decreased endurance, Increased muscle spasms, Pain, Decreased balance, Decreased mobility, Decreased strength, Postural dysfunction, Improper body mechanics, Impaired flexibility  Visit Diagnosis: Muscle weakness (generalized)  Unsteadiness on feet  Cognitive communication deficit  Dizziness and giddiness  Unspecified symptoms and signs involving cognitive functions following nontraumatic subarachnoid hemorrhage  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 [place], [place],  [address] Phone: [phone]   Fax:  [phone]  Name: Amanda Henry MRN: [id number] Date of Birth: 05/24/1965

## 2021-05-08 ENCOUNTER — Ambulatory Visit: Payer: BC Managed Care – PPO | Admitting: Speech Pathology

## 2021-05-10 ENCOUNTER — Ambulatory Visit: Payer: BC Managed Care – PPO | Admitting: Physical Therapy

## 2021-05-15 ENCOUNTER — Encounter: Payer: Self-pay | Admitting: Physical Therapy

## 2021-05-15 ENCOUNTER — Ambulatory Visit: Payer: BC Managed Care – PPO | Admitting: Physical Therapy

## 2021-05-15 ENCOUNTER — Ambulatory Visit: Payer: BC Managed Care – PPO | Admitting: Speech Pathology

## 2021-05-15 ENCOUNTER — Other Ambulatory Visit: Payer: Self-pay

## 2021-05-15 DIAGNOSIS — R41841 Cognitive communication deficit: Secondary | ICD-10-CM

## 2021-05-15 DIAGNOSIS — R2681 Unsteadiness on feet: Secondary | ICD-10-CM

## 2021-05-15 DIAGNOSIS — M6281 Muscle weakness (generalized): Secondary | ICD-10-CM

## 2021-05-15 DIAGNOSIS — I69054 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: Secondary | ICD-10-CM | POA: Diagnosis not present

## 2021-05-15 NOTE — Therapy (Signed)
[place]. [place], [place], [address] Phone: [phone]   Fax:  [phone]  Speech Language Pathology Treatment  Patient Details  Name: Amanda Henry MRN: [id number] Date of Birth: Jan 06, 1966 Referring Provider (SLP): [name] FNP   Encounter Date: 05/15/2021   End of Session - 05/15/21 0921     Visit Number 3    Number of Visits 5    Date for SLP Re-Evaluation 06/27/21    SLP Start Time 0810    SLP Stop Time  0848    SLP Time Calculation (min) 38 min    Activity Tolerance Patient tolerated treatment well             Past Medical History:  Diagnosis Date   Anxiety    Blood transfusion without reported diagnosis    Depression    Hypertension    no medications at this time   Hypothyroidism    Iron deficiency anemia    Thyroid disease     Past Surgical History:  Procedure Laterality Date   IR ANGIO INTRA EXTRACRAN SEL COM CAROTID INNOMINATE UNI L MOD SED  03/16/2021   IR ANGIO INTRA EXTRACRAN SEL COM CAROTID INNOMINATE UNI L MOD SED  03/26/2021   IR ANGIO INTRA EXTRACRAN SEL INTERNAL CAROTID UNI R MOD SED  03/15/2021   IR ANGIO INTRA EXTRACRAN SEL INTERNAL CAROTID UNI R MOD SED  03/26/2021   IR ANGIO INTRA EXTRACRAN SEL INTERNAL CAROTID UNI R MOD SED  03/25/2021   IR ANGIO VERTEBRAL SEL VERTEBRAL UNI L MOD SED  03/15/2021   IR ANGIO VERTEBRAL SEL VERTEBRAL UNI L MOD SED  03/26/2021   IR ANGIOGRAM FOLLOW UP STUDY  03/26/2021   IR ANGIOGRAM FOLLOW UP STUDY  03/26/2021   IR TRANSCATH/EMBOLIZ  03/26/2021   IR US GUIDE VASC ACCESS RIGHT  03/26/2021   LAPAROSCOPIC VAGINAL HYSTERECTOMY WITH SALPINGECTOMY Bilateral 10/01/2016   Procedure: LAPAROSCOPIC ASSISTED VAGINAL HYSTERECTOMY WITH SALPINGECTOMY;  Surgeon: Paula Compton, MD;  Location: [facility];  Service: Gynecology;  Laterality: Bilateral;   PARATHYROIDECTOMY Right 04/05/2021   Procedure: right inferioir PARATHYROIDECTOMY;  Surgeon: Armandina Gemma, MD;  Location: [facility];  Service: General;  Laterality: Right;   RADIOLOGY WITH ANESTHESIA N/A 03/15/2021   Procedure: IR WITH ANESTHESIA;  Surgeon: Consuella Lose, MD;  Location: [facility];  Service: Radiology;  Laterality: N/A;   RADIOLOGY WITH ANESTHESIA N/A 03/26/2021   Procedure: IR WITH ANESTHESIA (Coiling and Embolization);  Surgeon: Consuella Lose, MD;  Location: [facility];  Service: Radiology;  Laterality: N/A;   WISDOM TOOTH EXTRACTION      There were no vitals filed for this visit.   Subjective Assessment - 05/15/21 0920     Subjective "I'm doing okay."    Currently in Pain? No/denies                   ADULT SLP TREATMENT - 05/15/21 0916       General Information   Behavior/Cognition Alert;Cooperative      Treatment Provided   Treatment provided Cognitive-Linquistic      Cognitive-Linquistic Treatment   Treatment focused on Cognition    Skilled Treatment Completed training on memory strategies this session. Pt reported she is using her external memory strategies with success at home (journal, writing notes around the house). 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 Pt brought in medications. Edu on efficient and safe way to  organize medications. Pt demonstrated understanding. Required minA to complete and double check work. Begin attention strategies next session.      Assessment / Recommendations / Plan   Plan Continue with current plan of care      Progression Toward Goals   Progression toward goals Progressing toward goals                SLP Short Term Goals - 05/15/21 9604       SLP SHORT TERM GOAL #1   Title Pt will increase auditory comprehension by recalling and verbalizing strategies to assist with active listening during conversation with minA.    Time 2    Period Weeks    Status --   Ongong   Target Date 05/10/21      SLP SHORT TERM GOAL #2   Title Pt will comprehend  functional memory or visual aids for recall of important information during structured conversations with minA.    Time 2    Period Weeks    Status Achieved    Target Date 05/10/21      SLP SHORT TERM GOAL #3   Title Pt will recall word finding strategies for anomia in conversation with minA    Time 2    Period Weeks    Status Not Met   Ongoing   Target Date 05/10/21              SLP Long Term Goals - 04/26/21 1141       SLP LONG TERM GOAL #1   Title Pt will increase auditory comprehension by recalling and verbalizing strategies to assist with active listening during unstructured conversation independently.    Time 4    Period Weeks    Status New    Target Date 05/24/21      SLP LONG TERM GOAL #2   Title Pt will demonstrate understanding and verbalize use of functional memory/visual aids to recall important information in unstructured conversations independently.    Time 4    Period Weeks    Status New    Target Date 05/24/21      SLP LONG TERM GOAL #3   Title Pt will report successful use of word finding strategies in decreasing of instances of anomia during conversation.    Time 4    Period Weeks    Status New    Target Date 05/24/21              Plan - 05/15/21 5409     Clinical Impression Statement See tx note Pt in agreement with POC.    Speech Therapy Frequency 1x /week    Duration 4 weeks    Treatment/Interventions Compensatory strategies;Cueing hierarchy;Functional tasks;Patient/family education;Environmental controls;Cognitive reorganization;Compensatory techniques;Internal/external aids;Language facilitation;Multimodal communcation approach    Potential to Achieve Goals Good    Consulted and Agree with Plan of Care Patient             Patient will benefit from skilled therapeutic intervention in order to improve the following deficits and impairments:   Cognitive communication deficit    Problem List Patient Active Problem List    Diagnosis Date Noted   Hypercalcemia 04/03/2021   Hypokalemia 04/03/2021   History of subarachnoid hemorrhage 04/03/2021   Hyperparathyroidism ([place]) 04/03/2021   SAH (subarachnoid hemorrhage) ([place]) 03/15/2021   Mixed hyperlipidemia 06/03/2018   Prediabetes 06/03/2018   S/P laparoscopic assisted vaginal hysterectomy (LAVH) 10/01/2016   Menorrhagia 09/10/2016   Iron deficiency anemia 05/06/2016   Acquired hypothyroidism 05/24/2013  Depression with anxiety 05/24/2013    Verdene Lennert, [place] 05/15/2021, 9:28 AM  [place]. [place], [place], [address] Phone: [phone]   Fax:  [phone]   Name: Amanda Henry MRN: [id number] Date of Birth: November 17, 1965

## 2021-05-15 NOTE — Therapy (Signed)
[place]. [place], [place], [address] Phone: [phone]   Fax:  [phone]  Physical Therapy Treatment  Patient Details  Name: Amanda Henry MRN: [id number] Date of Birth: 04/29/1966 Referring Provider (PT): Doreene Burke Moore/Christine Gherkin   Encounter Date: 05/15/2021   PT End of Session - 05/15/21 0929     Visit Number 5    Number of Visits 8    Date for PT Re-Evaluation 05/21/21    PT Start Time 5102    PT Stop Time 0929    PT Time Calculation (min) 40 min    Activity Tolerance Patient tolerated treatment well    Behavior During Therapy WFL for tasks assessed/performed             Past Medical History:  Diagnosis Date   Anxiety    Blood transfusion without reported diagnosis    Depression    Hypertension    no medications at this time   Hypothyroidism    Iron deficiency anemia    Thyroid disease     Past Surgical History:  Procedure Laterality Date   IR ANGIO INTRA EXTRACRAN SEL COM CAROTID INNOMINATE UNI L MOD SED  03/16/2021   IR ANGIO INTRA EXTRACRAN SEL COM CAROTID INNOMINATE UNI L MOD SED  03/26/2021   IR ANGIO INTRA EXTRACRAN SEL INTERNAL CAROTID UNI R MOD SED  03/15/2021   IR ANGIO INTRA EXTRACRAN SEL INTERNAL CAROTID UNI R MOD SED  03/26/2021   IR ANGIO INTRA EXTRACRAN SEL INTERNAL CAROTID UNI R MOD SED  03/25/2021   IR ANGIO VERTEBRAL SEL VERTEBRAL UNI L MOD SED  03/15/2021   IR ANGIO VERTEBRAL SEL VERTEBRAL UNI L MOD SED  03/26/2021   IR ANGIOGRAM FOLLOW UP STUDY  03/26/2021   IR ANGIOGRAM FOLLOW UP STUDY  03/26/2021   IR TRANSCATH/EMBOLIZ  03/26/2021   IR US GUIDE VASC ACCESS RIGHT  03/26/2021   LAPAROSCOPIC VAGINAL HYSTERECTOMY WITH SALPINGECTOMY Bilateral 10/01/2016   Procedure: LAPAROSCOPIC ASSISTED VAGINAL HYSTERECTOMY WITH SALPINGECTOMY;  Surgeon: Paula Compton, MD;  Location: [facility];  Service: Gynecology;  Laterality: Bilateral;   PARATHYROIDECTOMY Right 04/05/2021    Procedure: right inferioir PARATHYROIDECTOMY;  Surgeon: Armandina Gemma, MD;  Location: [facility];  Service: General;  Laterality: Right;   RADIOLOGY WITH ANESTHESIA N/A 03/15/2021   Procedure: IR WITH ANESTHESIA;  Surgeon: Consuella Lose, MD;  Location: [facility];  Service: Radiology;  Laterality: N/A;   RADIOLOGY WITH ANESTHESIA N/A 03/26/2021   Procedure: IR WITH ANESTHESIA (Coiling and Embolization);  Surgeon: Consuella Lose, MD;  Location: [facility];  Service: Radiology;  Laterality: N/A;   WISDOM TOOTH EXTRACTION      There were no vitals filed for this visit.   Subjective Assessment - 05/15/21 0849     Subjective I feel good, I'm still having issues with my left leg aching but this was as issue before the bleed anyway. Things are better with my BP meds, now I'm not having any dizzy episodes any more, my MD said it takes awhile to get used to them. I'm really feeling much better. I'm still getting tired easily but this is getting better over time. I'd mostly like to focus on strength at this point.    Pertinent History Parathyroid surgery. H/O sciatica    Patient Stated Goals Return to her normal level of activity, drive, live I.    Currently in Pain? No/denies  [facility] Adult PT Treatment/Exercise - 05/15/21 0001       Lumbar Exercises: Aerobic   Nustep L 5 x5 minutes for BLE strenght/endurance      Lumbar Exercises: Standing   Heel Raises 15 reps    Heel Raises Limitations heel and toe    Other Standing Lumbar Exercises forward and lateral step ups 4 inch step 1x15 B; step downs 1x10 B 4 inch step; sit to stand power up eccentric lower 1x10 chair with 2 foam pads for elevated surface    Other Standing Lumbar Exercises DL 6# 1x10 mod cues for good form; hip hikes 1x10 B                 Balance Exercises - 05/15/21 0001       Balance Exercises: Standing   Tandem Stance Eyes open;3 reps;20 secs;Foam/compliant surface    SLS Eyes  open;3 reps;15 secs    Tandem Gait Forward;Retro;3 reps   in // bars   Sidestepping Foam/compliant support;3 reps   stepping over cones in // bars               PT Education - 05/15/21 0929     Education Details likely dc in next 2-3 sessions; grip strength exercise with stress or tennis ball    Person(s) Educated Patient    Methods Explanation    Comprehension Verbalized understanding              PT Short Term Goals - 05/07/21 1209       PT SHORT TERM GOAL #1   Title Patient will be I with basic HEP    Baseline initiated.    Time 1    Period Weeks    Status On-going    Target Date 05/07/21               PT Long Term Goals - 04/23/21 1434       PT LONG TERM GOAL #1   Title I with final HEP    Time 4    Period Weeks    Status New    Target Date 05/21/21      PT LONG TERM GOAL #2   Title Increase BLE strength to at least 4/5 throughout    Baseline (3+)-4-/5    Time 4    Period Weeks    Status New    Target Date 05/21/21      PT LONG TERM GOAL #3   Title Patient will perform advanced balance actrivities, including walking on unlevel surfaces, quick turns, plyometric movements, all without LOB, I.    Time 4    Period Weeks    Status New    Target Date 05/21/21      PT LONG TERM GOAL #4   Title Patient will perform all functional activitiy, including head turns, turning trunk, sup<> sit, rolling, without any vestibular symptoms.    Baseline dizziness iwth rolling.    Time 4    Period Weeks    Status New    Target Date 05/21/21                   Plan - 05/15/21 0929     Clinical Impression Statement Aleen arrives today feeling much better- reports she is still getting used to BP meds but it is improving. I did recheck VOR tests, had very mild improvement with horizontal movement but over this looked much better this morning as well. I do question if some of  her dizziness was simply from getting used to a new medicine.  Spent a  good part of session working on functional BLE strength per her request, also threw in some advanced balance challenges as well. Might be ready for DC soon.    Personal Factors and Comorbidities Past/Current Experience;Comorbidity 2    Comorbidities recent subarachnoid hemorrhage, recent removal of parathyroid gland.    Examination-Activity Limitations Bend;Locomotion Level;Squat    Examination-Participation Restrictions Occupation    Stability/Clinical Decision Making Stable/Uncomplicated    Clinical Decision Making Low    Rehab Potential Good    PT Frequency Other (comment)   1-2 x/week   PT Duration 3 weeks    PT Treatment/Interventions Neuromuscular re-education;Manual techniques;Dry needling;Balance training;Therapeutic exercise;Therapeutic activities;Functional mobility training;Stair training;Passive range of motion;Iontophoresis 4mg /ml Dexamethasone;ADLs/Self Care Home Management;Vestibular    PT Next Visit Plan focus on advanced strenght/balance. Update HEP including with hand strength exercise.  DC in 2-3 sessions (pt agreeable due to high copay)    PT Home Exercise Plan [id number]    Consulted and Agree with Plan of Care Patient             Patient will benefit from skilled therapeutic intervention in order to improve the following deficits and impairments:  Decreased coordination, Decreased range of motion, Dizziness, Decreased endurance, Increased muscle spasms, Pain, Decreased balance, Decreased mobility, Decreased strength, Postural dysfunction, Improper body mechanics, Impaired flexibility  Visit Diagnosis: Muscle weakness (generalized)  Unsteadiness on feet     Problem List Patient Active Problem List   Diagnosis Date Noted   Hypercalcemia 04/03/2021   Hypokalemia 04/03/2021   History of subarachnoid hemorrhage 04/03/2021   Hyperparathyroidism ([place]) 04/03/2021   SAH (subarachnoid hemorrhage) ([facility]) 03/15/2021   Mixed hyperlipidemia 06/03/2018   Prediabetes  06/03/2018   S/P laparoscopic assisted vaginal hysterectomy (LAVH) 10/01/2016   Menorrhagia 09/10/2016   Iron deficiency anemia 05/06/2016   Acquired hypothyroidism 05/24/2013   Depression with anxiety 05/24/2013   Ann Lions PT, DPT, PN2   Supplemental Physical Therapist [place]    Pager [phone] Acute Rehab Office [place]. [place], [place], [address] Phone: [phone]   Fax:  [phone]  Name: Addisson Frate MRN: [id number] Date of Birth: 02-22-1966

## 2021-05-16 ENCOUNTER — Ambulatory Visit: Payer: BC Managed Care – PPO | Admitting: Physical Therapy

## 2021-05-22 ENCOUNTER — Encounter: Payer: Self-pay | Admitting: Physical Therapy

## 2021-05-22 ENCOUNTER — Ambulatory Visit: Payer: BC Managed Care – PPO | Attending: Nurse Practitioner | Admitting: Speech Pathology

## 2021-05-22 ENCOUNTER — Other Ambulatory Visit: Payer: Self-pay

## 2021-05-22 ENCOUNTER — Encounter: Payer: Self-pay | Admitting: Speech Pathology

## 2021-05-22 ENCOUNTER — Ambulatory Visit: Payer: BC Managed Care – PPO | Admitting: Physical Therapy

## 2021-05-22 DIAGNOSIS — M6281 Muscle weakness (generalized): Secondary | ICD-10-CM | POA: Diagnosis present

## 2021-05-22 DIAGNOSIS — R41841 Cognitive communication deficit: Secondary | ICD-10-CM | POA: Insufficient documentation

## 2021-05-22 DIAGNOSIS — I69054 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: Secondary | ICD-10-CM | POA: Diagnosis present

## 2021-05-22 DIAGNOSIS — R2681 Unsteadiness on feet: Secondary | ICD-10-CM

## 2021-05-22 NOTE — Therapy (Signed)
[facility]. [place], [place], [address] Phone: [phone]   Fax:  [phone]  Speech Language Pathology Treatment  Patient Details  Name: Amanda Henry MRN: [id number] Date of Birth: 1966/01/01 Referring Provider (SLP): [name] FNP   Encounter Date: 05/22/2021   End of Session - 05/22/21 1551     Visit Number 4    Number of Visits 5    Date for SLP Re-Evaluation 06/27/21    SLP Start Time 0930    SLP Stop Time  1010    SLP Time Calculation (min) 40 min    Activity Tolerance Patient tolerated treatment well             Past Medical History:  Diagnosis Date   Anxiety    Blood transfusion without reported diagnosis    Depression    Hypertension    no medications at this time   Hypothyroidism    Iron deficiency anemia    Thyroid disease     Past Surgical History:  Procedure Laterality Date   IR ANGIO INTRA EXTRACRAN SEL COM CAROTID INNOMINATE UNI L MOD SED  03/16/2021   IR ANGIO INTRA EXTRACRAN SEL COM CAROTID INNOMINATE UNI L MOD SED  03/26/2021   IR ANGIO INTRA EXTRACRAN SEL INTERNAL CAROTID UNI R MOD SED  03/15/2021   IR ANGIO INTRA EXTRACRAN SEL INTERNAL CAROTID UNI R MOD SED  03/26/2021   IR ANGIO INTRA EXTRACRAN SEL INTERNAL CAROTID UNI R MOD SED  03/25/2021   IR ANGIO VERTEBRAL SEL VERTEBRAL UNI L MOD SED  03/15/2021   IR ANGIO VERTEBRAL SEL VERTEBRAL UNI L MOD SED  03/26/2021   IR ANGIOGRAM FOLLOW UP STUDY  03/26/2021   IR ANGIOGRAM FOLLOW UP STUDY  03/26/2021   IR TRANSCATH/EMBOLIZ  03/26/2021   IR US GUIDE VASC ACCESS RIGHT  03/26/2021   LAPAROSCOPIC VAGINAL HYSTERECTOMY WITH SALPINGECTOMY Bilateral 10/01/2016   Procedure: LAPAROSCOPIC ASSISTED VAGINAL HYSTERECTOMY WITH SALPINGECTOMY;  Surgeon: Paula Compton, MD;  Location: [facility];  Service: Gynecology;  Laterality: Bilateral;   PARATHYROIDECTOMY Right 04/05/2021   Procedure: right inferioir PARATHYROIDECTOMY;  Surgeon: Armandina Gemma,  MD;  Location: [facility];  Service: General;  Laterality: Right;   RADIOLOGY WITH ANESTHESIA N/A 03/15/2021   Procedure: IR WITH ANESTHESIA;  Surgeon: Consuella Lose, MD;  Location: [facility];  Service: Radiology;  Laterality: N/A;   RADIOLOGY WITH ANESTHESIA N/A 03/26/2021   Procedure: IR WITH ANESTHESIA (Coiling and Embolization);  Surgeon: Consuella Lose, MD;  Location: [facility];  Service: Radiology;  Laterality: N/A;   WISDOM TOOTH EXTRACTION      There were no vitals filed for this visit.   Subjective Assessment - 05/22/21 0932     Subjective "I feel like things have gotten better. I feel my mind is much more clear in terms of remembering things."    Currently in Pain? Yes    Pain Score 6     Pain Location Knee    Pain Descriptors / Indicators Aching;Sore    Pain Relieving Factors None today                   ADULT SLP TREATMENT - 05/22/21 1230       General Information   Behavior/Cognition Alert;Cooperative      Treatment Provided   Treatment provided Cognitive-Linquistic      Cognitive-Linquistic Treatment   Treatment focused on Cognition    Skilled Treatment Began edu active listening to facilitate increase comprehension  strategies. Discussed fatigue and monitoring fatigue. *talk about spoon theory next session* ; edu on how routine can impact sleep schedule post BI. Pt plans to try switching nap to 11:30 vs. 3:30 which is closer to 9p bedtime. Pt is reportedly using memory strategies successfully at home.      Assessment / Recommendations / Plan   Plan Continue with current plan of care      Progression Toward Goals   Progression toward goals Progressing toward goals                SLP Short Term Goals - 05/15/21 7915       SLP SHORT TERM GOAL #1   Title Pt will increase auditory comprehension by recalling and verbalizing strategies to assist with active listening during conversation with minA.    Time 2    Period Weeks    Status --   Ongong    Target Date 05/10/21      SLP SHORT TERM GOAL #2   Title Pt will comprehend functional memory or visual aids for recall of important information during structured conversations with minA.    Time 2    Period Weeks    Status Achieved    Target Date 05/10/21      SLP SHORT TERM GOAL #3   Title Pt will recall word finding strategies for anomia in conversation with minA    Time 2    Period Weeks    Status Not Met   Ongoing   Target Date 05/10/21              SLP Long Term Goals - 04/26/21 1141       SLP LONG TERM GOAL #1   Title Pt will increase auditory comprehension by recalling and verbalizing strategies to assist with active listening during unstructured conversation independently.    Time 4    Period Weeks    Status New    Target Date 05/24/21      SLP LONG TERM GOAL #2   Title Pt will demonstrate understanding and verbalize use of functional memory/visual aids to recall important information in unstructured conversations independently.    Time 4    Period Weeks    Status New    Target Date 05/24/21      SLP LONG TERM GOAL #3   Title Pt will report successful use of word finding strategies in decreasing of instances of anomia during conversation.    Time 4    Period Weeks    Status New    Target Date 05/24/21              Plan - 05/22/21 1552     Clinical Impression Statement See tx note. Pt in agreement with POC.    Speech Therapy Frequency 1x /week    Duration 4 weeks    Treatment/Interventions Compensatory strategies;Cueing hierarchy;Functional tasks;Patient/family education;Environmental controls;Cognitive reorganization;Compensatory techniques;Internal/external aids;Language facilitation;Multimodal communcation approach    Potential to Achieve Goals Good    Consulted and Agree with Plan of Care Patient             Patient will benefit from skilled therapeutic intervention in order to improve the following deficits and impairments:   Cognitive  communication deficit    Problem List Patient Active Problem List   Diagnosis Date Noted   Hypercalcemia 04/03/2021   Hypokalemia 04/03/2021   History of subarachnoid hemorrhage 04/03/2021   Hyperparathyroidism ([place]) 04/03/2021   SAH (subarachnoid hemorrhage) ([facility]) 03/15/2021   Mixed  hyperlipidemia 06/03/2018   Prediabetes 06/03/2018   S/P laparoscopic assisted vaginal hysterectomy (LAVH) 10/01/2016   Menorrhagia 09/10/2016   Iron deficiency anemia 05/06/2016   Acquired hypothyroidism 05/24/2013   Depression with anxiety 05/24/2013    Verdene Lennert, CCC-SLP 05/22/2021, 4:03 PM  [facility]. [place], [place], [address] Phone: [phone]   Fax:  [phone]   Name: Caitlen Worth MRN: [id number] Date of Birth: [birth year]

## 2021-05-22 NOTE — Therapy (Signed)
[facility]. [place], [place], [address] Phone: [phone]   Fax:  [phone]  Physical Therapy Treatment  Patient Details  Name: Amanda Henry MRN: [id number] Date of Birth: [birth year] Referring Provider (PT): [name]   Encounter Date: 05/22/2021   PT End of Session - 05/22/21 0940     Visit Number 6    Number of Visits 7    PT Start Time 0848    PT Stop Time 0929    PT Time Calculation (min) 41 min    Activity Tolerance Patient tolerated treatment well    Behavior During Therapy WFL for tasks assessed/performed             Past Medical History:  Diagnosis Date   Anxiety    Blood transfusion without reported diagnosis    Depression    Hypertension    no medications at this time   Hypothyroidism    Iron deficiency anemia    Thyroid disease     Past Surgical History:  Procedure Laterality Date   IR ANGIO INTRA EXTRACRAN SEL COM CAROTID INNOMINATE UNI L MOD SED  03/16/2021   IR ANGIO INTRA EXTRACRAN SEL COM CAROTID INNOMINATE UNI L MOD SED  03/26/2021   IR ANGIO INTRA EXTRACRAN SEL INTERNAL CAROTID UNI R MOD SED  03/15/2021   IR ANGIO INTRA EXTRACRAN SEL INTERNAL CAROTID UNI R MOD SED  03/26/2021   IR ANGIO INTRA EXTRACRAN SEL INTERNAL CAROTID UNI R MOD SED  03/25/2021   IR ANGIO VERTEBRAL SEL VERTEBRAL UNI L MOD SED  03/15/2021   IR ANGIO VERTEBRAL SEL VERTEBRAL UNI L MOD SED  03/26/2021   IR ANGIOGRAM FOLLOW UP STUDY  03/26/2021   IR ANGIOGRAM FOLLOW UP STUDY  03/26/2021   IR TRANSCATH/EMBOLIZ  03/26/2021   IR US GUIDE VASC ACCESS RIGHT  03/26/2021   LAPAROSCOPIC VAGINAL HYSTERECTOMY WITH SALPINGECTOMY Bilateral 10/01/2016   Procedure: LAPAROSCOPIC ASSISTED VAGINAL HYSTERECTOMY WITH SALPINGECTOMY;  Surgeon: Paula Compton, MD;  Location: [facility];  Service: Gynecology;  Laterality: Bilateral;   PARATHYROIDECTOMY Right 04/05/2021   Procedure: right inferioir PARATHYROIDECTOMY;  Surgeon: Armandina Gemma, MD;  Location: [facility];  Service: General;  Laterality: Right;   RADIOLOGY WITH ANESTHESIA N/A 03/15/2021   Procedure: IR WITH ANESTHESIA;  Surgeon: Consuella Lose, MD;  Location: [facility];  Service: Radiology;  Laterality: N/A;   RADIOLOGY WITH ANESTHESIA N/A 03/26/2021   Procedure: IR WITH ANESTHESIA (Coiling and Embolization);  Surgeon: Consuella Lose, MD;  Location: [facility];  Service: Radiology;  Laterality: N/A;   WISDOM TOOTH EXTRACTION      There were no vitals filed for this visit.   Subjective Assessment - 05/22/21 0851     Subjective I'm still feeling much better, the only thing that's hard for me is walking up steps- just strength. No falls, no close calls.    Pertinent History Parathyroid surgery. H/O sciatica    How long can you stand comfortably? 1/4- no issues    How long can you walk comfortably? 1/4- much better, no problems here    Patient Stated Goals Return to her normal level of activity, drive, live I.    Currently in Pain? No/denies   just general muscle soreness               [facility] PT Assessment - 05/22/21 0001       Assessment   Medical Diagnosis Subarachnoid hemorrhage; parathyroidectomy on 04/05/21    Referring Provider (PT)  [name]    Onset Date/Surgical Date 03/15/21    Next MD Visit Dr. Catalina Antigua in February    Prior Therapy Acute OT/PT/ST      Precautions   Precautions None      Restrictions   Weight Bearing Restrictions No      Balance Screen   Has the patient fallen in the past 6 months No    Has the patient had a decrease in activity level because of a fear of falling?  No      Home Ec[place] residence      Prior Function   Level of Independence Independent    Vocation Full time employment    Public librarian; currently on short-term leave    Leisure read, workout      Strength   Right Hip Flexion 4+/5    Right Hip Extension 3/5    Right Hip ABduction 4+/5    Left Hip  Flexion 4-/5    Left Hip Extension 3/5    Left Hip ABduction 4/5    Right Knee Flexion 4-/5    Right Knee Extension 4-/5    Left Knee Flexion 4+/5    Left Knee Extension 4+/5    Right Ankle Dorsiflexion 4+/5    Left Ankle Dorsiflexion 4+/5      Dynamic Gait Index   Level Surface Normal    Change in Gait Speed Normal    Gait with Horizontal Head Turns Normal    Gait with Vertical Head Turns Normal    Gait and Pivot Turn Mild Impairment    Step Over Obstacle Normal    Step Around Obstacles Mild Impairment    Steps Normal    Total Score 22                           [facility] Adult PT Treatment/Exercise - 05/22/21 0001       Lumbar Exercises: Standing   Heel Raises 20 reps    Heel Raises Limitations heel and toe    Other Standing Lumbar Exercises forward and lateral step ups 4 inch step 1x20 B; step downs 1x10 B 4 inch step; wall sits 1x10 1/8th depth 3 second holds    Other Standing Lumbar Exercises hip hikes 2x10 B with and without swing ; farmers carry 10# 28f                 Balance Exercises - 05/22/21 0001       Balance Exercises: Standing   Tandem Stance Eyes open;3 reps;30 secs    Other Standing Exercises step ups on BOSU intermittnet BUE support 1x5 b                PT Education - 05/22/21 0939     Education Details reassessment findings, DC next time with advanced HEP    Person(s) Educated Patient    Methods Explanation    Comprehension Verbalized understanding              PT Short Term Goals - 05/22/21 0903       PT SHORT TERM GOAL #1   Title Patient will be I with basic HEP    Baseline 1/4- going pretty good, I've been using ones in folder but not online    Time 1    Period Weeks    Status Partially Met  PT Long Term Goals - 05/22/21 0904       PT LONG TERM GOAL #1   Title I with final HEP    Time 4    Period Weeks    Status On-going      PT LONG TERM GOAL #2   Title Increase BLE  strength to at least 4/5 throughout    Baseline 1/4- improving    Time 4    Period Weeks    Status On-going    Target Date 05/21/21      PT LONG TERM GOAL #3   Title 1/4- 22/24    Time 4    Period Weeks    Status Achieved      PT LONG TERM GOAL #4   Title Patient will perform all functional activitiy, including head turns, turning trunk, sup<> sit, rolling, without any vestibular symptoms.    Time 4    Period Weeks    Status Achieved                   Plan - 05/22/21 0940     Clinical Impression Statement Leeya arrives today doing much better- performed reassess per insurance requirements, she is really coming along nicely with main remaining concern being functional strength and endurance. She does have a bit of a high co-pay, I think that this time we will plan to work with her for one more session so we can give her an advanced functional strengthening program and get her familiar with gym equipment prior to DC. Making excellent progress overall!    Personal Factors and Comorbidities Past/Current Experience;Comorbidity 2    Comorbidities recent subarachnoid hemorrhage, recent removal of parathyroid gland.    Examination-Activity Limitations Bend;Locomotion Level;Squat    Examination-Participation Restrictions Occupation    Stability/Clinical Decision Making Stable/Uncomplicated    Clinical Decision Making Low    Rehab Potential Good    PT Frequency --   1 more session   PT Duration Other (comment)   1 more session   PT Treatment/Interventions Neuromuscular re-education;Manual techniques;Dry needling;Balance training;Therapeutic exercise;Therapeutic activities;Functional mobility training;Stair training;Passive range of motion;Iontophoresis 71m/ml Dexamethasone;ADLs/Self Care Home Management;Vestibular    PT Next Visit Plan DC- give advanced HEP for strength and educate about gym equipment    PT Home Exercise Plan [email or web address]    Consulted and Agree with Plan of Care  Patient             Patient will benefit from skilled therapeutic intervention in order to improve the following deficits and impairments:  Decreased coordination, Decreased range of motion, Dizziness, Decreased endurance, Increased muscle spasms, Pain, Decreased balance, Decreased mobility, Decreased strength, Postural dysfunction, Improper body mechanics, Impaired flexibility  Visit Diagnosis: Muscle weakness (generalized)  Unsteadiness on feet     Problem List Patient Active Problem List   Diagnosis Date Noted   Hypercalcemia 04/03/2021   Hypokalemia 04/03/2021   History of subarachnoid hemorrhage 04/03/2021   Hyperparathyroidism ([facility] 04/03/2021   SAH (subarachnoid hemorrhage) ([facility] 03/15/2021   Mixed hyperlipidemia 06/03/2018   Prediabetes 06/03/2018   S/P laparoscopic assisted vaginal hysterectomy (LAVH) 10/01/2016   Menorrhagia 09/10/2016   Iron deficiency anemia 05/06/2016   Acquired hypothyroidism 05/24/2013   Depression with anxiety 05/24/2013   KAnn LionsPT, DPT, PN2   Supplemental Physical Therapist [place]   Pager [phone] Rehab Office [address] [place] [address]Phone: [phone]  Fax:  [phone]  Name: Shreya Lacasse MRN: [id number] Date of Birth: 05-Mar-1966

## 2021-05-24 ENCOUNTER — Other Ambulatory Visit: Payer: Self-pay

## 2021-05-24 ENCOUNTER — Encounter: Payer: Self-pay | Admitting: Physical Therapy

## 2021-05-24 ENCOUNTER — Ambulatory Visit: Payer: BC Managed Care – PPO | Admitting: Physical Therapy

## 2021-05-24 DIAGNOSIS — R2681 Unsteadiness on feet: Secondary | ICD-10-CM

## 2021-05-24 DIAGNOSIS — M6281 Muscle weakness (generalized): Secondary | ICD-10-CM

## 2021-05-24 DIAGNOSIS — R41841 Cognitive communication deficit: Secondary | ICD-10-CM | POA: Diagnosis not present

## 2021-05-24 DIAGNOSIS — I69054 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: Secondary | ICD-10-CM

## 2021-05-24 NOTE — Patient Instructions (Signed)
Access Code: [email or web address] URL: [email or web address] Date: 05/24/2021 Prepared by: Ethel Rana  Exercises Step Up - 1 x daily - 7 x weekly - 3 sets - 10 reps Lateral Step Up - 1 x daily - 7 x weekly - 3 sets - 10 reps Standing 3-Way Leg Reach with Resistance at Ankles and Counter Support - 1 x daily - 7 x weekly - 2 sets - 10 reps Quadruped Alternating Arm Lift - 1 x daily - 7 x weekly - 1 sets - 10 reps Quadruped Alternating Leg Extensions - 1 x daily - 7 x weekly - 1 sets - 10 reps Bird Dog - 1 x daily - 7 x weekly - 1 sets - 10 reps

## 2021-05-24 NOTE — Therapy (Signed)
[facility]. [place], [place], [address] Phone: [phone]   Fax:  [phone]  Physical Therapy Treatment PHYSICAL THERAPY DISCHARGE SUMMARY  Visits from Start of Care: 7  Current functional level related to goals / functional outcomes: Goals met, I with all mobility   Remaining deficits: Decreased trunk stability   Education / Equipment: HEP   Patient agrees to discharge. Patient goals were met. Patient is being discharged due to meeting the stated rehab goals.  Patient Details  Name: Amanda Henry MRN: [id number] Date of Birth: 1965-08-10 Referring Provider (PT): [name]   Encounter Date: 05/24/2021   PT End of Session - 05/24/21 0917     Visit Number 7    Number of Visits 7    PT Start Time 0848    PT Stop Time 0919    PT Time Calculation (min) 31 min    Activity Tolerance Patient tolerated treatment well    Behavior During Therapy WFL for tasks assessed/performed             Past Medical History:  Diagnosis Date   Anxiety    Blood transfusion without reported diagnosis    Depression    Hypertension    no medications at this time   Hypothyroidism    Iron deficiency anemia    Thyroid disease     Past Surgical History:  Procedure Laterality Date   IR ANGIO INTRA EXTRACRAN SEL COM CAROTID INNOMINATE UNI L MOD SED  03/16/2021   IR ANGIO INTRA EXTRACRAN SEL COM CAROTID INNOMINATE UNI L MOD SED  03/26/2021   IR ANGIO INTRA EXTRACRAN SEL INTERNAL CAROTID UNI R MOD SED  03/15/2021   IR ANGIO INTRA EXTRACRAN SEL INTERNAL CAROTID UNI R MOD SED  03/26/2021   IR ANGIO INTRA EXTRACRAN SEL INTERNAL CAROTID UNI R MOD SED  03/25/2021   IR ANGIO VERTEBRAL SEL VERTEBRAL UNI L MOD SED  03/15/2021   IR ANGIO VERTEBRAL SEL VERTEBRAL UNI L MOD SED  03/26/2021   IR ANGIOGRAM FOLLOW UP STUDY  03/26/2021   IR ANGIOGRAM FOLLOW UP STUDY  03/26/2021   IR TRANSCATH/EMBOLIZ  03/26/2021   IR US GUIDE VASC ACCESS  RIGHT  03/26/2021   LAPAROSCOPIC VAGINAL HYSTERECTOMY WITH SALPINGECTOMY Bilateral 10/01/2016   Procedure: LAPAROSCOPIC ASSISTED VAGINAL HYSTERECTOMY WITH SALPINGECTOMY;  Surgeon: Paula Compton, MD;  Location: [facility];  Service: Gynecology;  Laterality: Bilateral;   PARATHYROIDECTOMY Right 04/05/2021   Procedure: right inferioir PARATHYROIDECTOMY;  Surgeon: Armandina Gemma, MD;  Location: [facility];  Service: General;  Laterality: Right;   RADIOLOGY WITH ANESTHESIA N/A 03/15/2021   Procedure: IR WITH ANESTHESIA;  Surgeon: Consuella Lose, MD;  Location: [facility];  Service: Radiology;  Laterality: N/A;   RADIOLOGY WITH ANESTHESIA N/A 03/26/2021   Procedure: IR WITH ANESTHESIA (Coiling and Embolization);  Surgeon: Consuella Lose, MD;  Location: [facility];  Service: Radiology;  Laterality: N/A;   WISDOM TOOTH EXTRACTION      There were no vitals filed for this visit.   Subjective Assessment - 05/24/21 0902     Subjective patient feels ready for D/C.    Pertinent History Parathyroid surgery. H/O sciatica    How long can you sit comfortably? N/A    How long can you stand comfortably? 1/4- no issues    How long can you walk comfortably? 1/4- much better, no problems here    Patient Stated Goals Return to her normal level of activity, drive, live I.  Currently in Pain? No/denies    Pain Onset In the past 7 days                                        PT Education - 05/24/21 7048     Education Details Updated HEP, D/C instructions.    Person(s) Educated Patient    Methods Explanation;Demonstration;Handout    Comprehension Returned demonstration;Verbalized understanding              PT Short Term Goals - 05/24/21 0920       PT SHORT TERM GOAL #1   Title Patient will be I with basic HEP    Baseline 1/4- going pretty good, I've been using ones in folder but not online    Time 1    Period Weeks    Status Achieved               PT Long Term Goals -  05/24/21 0920       PT LONG TERM GOAL #1   Title I with final HEP    Time 4    Period Weeks    Status Achieved      PT LONG TERM GOAL #2   Title Increase BLE strength to at least 4/5 throughout    Baseline 4/5    Time 4    Period Weeks    Status Achieved    Target Date 05/21/21      PT LONG TERM GOAL #3   Title 1/4- 22/24    Time 4    Period Weeks    Status Achieved      PT LONG TERM GOAL #4   Title Patient will perform all functional activitiy, including head turns, turning trunk, sup<> sit, rolling, without any vestibular symptoms.    Time 4    Period Weeks    Status Achieved                   Plan - 05/24/21 8891     Clinical Impression Statement Patient is ready to progress to HEP and I exercises at home. She reports no more dizzy episodes, demonstrates improved strenght and balance. Able to return demosntrate her HEP and verbalized understanding of how to progress the program as her strength continues to increase. She reports no additional questions.    Personal Factors and Comorbidities Past/Current Experience;Comorbidity 2    Comorbidities recent subarachnoid hemorrhage, recent removal of parathyroid gland.    Examination-Activity Limitations Bend;Locomotion Level;Squat    Examination-Participation Restrictions Occupation    Stability/Clinical Decision Making Stable/Uncomplicated    Rehab Potential Good    PT Frequency --   1 more session   PT Duration Other (comment)   1 more session   PT Treatment/Interventions Neuromuscular re-education;Manual techniques;Dry needling;Balance training;Therapeutic exercise;Therapeutic activities;Functional mobility training;Stair training;Passive range of motion;Iontophoresis 51m/ml Dexamethasone;ADLs/Self Care Home Management;Vestibular    PT Next Visit Plan DC- give advanced HEP for strength and educate about gym equipment    PT Home Exercise Plan [email or web address]    Consulted and Agree with Plan of Care Patient              Patient will benefit from skilled therapeutic intervention in order to improve the following deficits and impairments:  Decreased coordination, Decreased range of motion, Dizziness, Decreased endurance, Increased muscle spasms, Pain, Decreased balance, Decreased mobility, Decreased strength, Postural dysfunction, Improper body mechanics, Impaired flexibility  Visit Diagnosis: Muscle weakness (generalized)  Unsteadiness on feet  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 [facility])     Problem List Patient Active Problem List   Diagnosis Date Noted   Hypercalcemia 04/03/2021   Hypokalemia 04/03/2021   History of subarachnoid hemorrhage 04/03/2021   Hyperparathyroidism ([facility]) 04/03/2021   SAH (subarachnoid hemorrhage) ([place]) 03/15/2021   Mixed hyperlipidemia 06/03/2018   Prediabetes 06/03/2018   S/P laparoscopic assisted vaginal hysterectomy (LAVH) 10/01/2016   Menorrhagia 09/10/2016   Iron deficiency anemia 05/06/2016   Acquired hypothyroidism 05/24/2013   Depression with anxiety 05/24/2013    Marcelina Morel, DPT 05/24/2021, 9:21 AM  [facility]. [place], [place], [address] Phone: [phone]   Fax:  [phone]  Name: Amanda Henry MRN: [id number] Date of Birth: 1965-10-19

## 2021-05-29 ENCOUNTER — Other Ambulatory Visit: Payer: Self-pay

## 2021-05-29 ENCOUNTER — Ambulatory Visit: Payer: BC Managed Care – PPO | Admitting: Speech Pathology

## 2021-05-29 ENCOUNTER — Encounter: Payer: Self-pay | Admitting: Speech Pathology

## 2021-05-29 ENCOUNTER — Ambulatory Visit: Payer: BC Managed Care – PPO | Admitting: Physical Therapy

## 2021-05-29 DIAGNOSIS — R41841 Cognitive communication deficit: Secondary | ICD-10-CM | POA: Diagnosis not present

## 2021-05-30 NOTE — Therapy (Signed)
[facility]. [place], [place], [address] Phone: [phone]   Fax:  [phone]  Speech Language Pathology Treatment & Recertification  Patient Details  Name: Amanda Henry MRN: [id number] Date of Birth: 14-Sep-1965 Referring Provider (SLP): [name] FNP   Encounter Date: 05/29/2021   End of Session - 05/30/21 0824     Visit Number 5    Number of Visits 5    Date for SLP Re-Evaluation 07/25/21    Authorization - Visit Number 0    Authorization - Number of Visits 3    SLP Start Time 2229    SLP Stop Time  1100    SLP Time Calculation (min) 41 min    Activity Tolerance Patient tolerated treatment well             Past Medical History:  Diagnosis Date   Anxiety    Blood transfusion without reported diagnosis    Depression    Hypertension    no medications at this time   Hypothyroidism    Iron deficiency anemia    Thyroid disease     Past Surgical History:  Procedure Laterality Date   IR ANGIO INTRA EXTRACRAN SEL COM CAROTID INNOMINATE UNI L MOD SED  03/16/2021   IR ANGIO INTRA EXTRACRAN SEL COM CAROTID INNOMINATE UNI L MOD SED  03/26/2021   IR ANGIO INTRA EXTRACRAN SEL INTERNAL CAROTID UNI R MOD SED  03/15/2021   IR ANGIO INTRA EXTRACRAN SEL INTERNAL CAROTID UNI R MOD SED  03/26/2021   IR ANGIO INTRA EXTRACRAN SEL INTERNAL CAROTID UNI R MOD SED  03/25/2021   IR ANGIO VERTEBRAL SEL VERTEBRAL UNI L MOD SED  03/15/2021   IR ANGIO VERTEBRAL SEL VERTEBRAL UNI L MOD SED  03/26/2021   IR ANGIOGRAM FOLLOW UP STUDY  03/26/2021   IR ANGIOGRAM FOLLOW UP STUDY  03/26/2021   IR TRANSCATH/EMBOLIZ  03/26/2021   IR US GUIDE VASC ACCESS RIGHT  03/26/2021   LAPAROSCOPIC VAGINAL HYSTERECTOMY WITH SALPINGECTOMY Bilateral 10/01/2016   Procedure: LAPAROSCOPIC ASSISTED VAGINAL HYSTERECTOMY WITH SALPINGECTOMY;  Surgeon: Paula Compton, MD;  Location: [facility];  Service: Gynecology;  Laterality: Bilateral;    PARATHYROIDECTOMY Right 04/05/2021   Procedure: right inferioir PARATHYROIDECTOMY;  Surgeon: Armandina Gemma, MD;  Location: [facility];  Service: General;  Laterality: Right;   RADIOLOGY WITH ANESTHESIA N/A 03/15/2021   Procedure: IR WITH ANESTHESIA;  Surgeon: Consuella Lose, MD;  Location: [facility];  Service: Radiology;  Laterality: N/A;   RADIOLOGY WITH ANESTHESIA N/A 03/26/2021   Procedure: IR WITH ANESTHESIA (Coiling and Embolization);  Surgeon: Consuella Lose, MD;  Location: [facility];  Service: Radiology;  Laterality: N/A;   WISDOM TOOTH EXTRACTION      There were no vitals filed for this visit.   Subjective Assessment - 05/29/21 1022     Subjective "I feel like things are the same."    Currently in Pain? No/denies                   ADULT SLP TREATMENT - 05/30/21 0821       General Information   Behavior/Cognition Alert;Cooperative      Treatment Provided   Treatment provided Cognitive-Linquistic      Cognitive-Linquistic Treatment   Treatment focused on Cognition    Skilled Treatment Continued active listening skills/attention strategies to facilitate increased comprehension during conversations. 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 Pt  reported she could improve on reducing background noise to facilitate easier conversations with family. She reports she is having people repeat information to increase comprehension and processing of incoming auditory information. Will continue to address strategies next session.,      Assessment / Recommendations / Plan   Plan Continue with current plan of care      Progression Toward Goals   Progression toward goals Progressing toward goals                SLP Short Term Goals - 05/15/21 3151       SLP SHORT TERM GOAL #1   Title Pt will increase auditory comprehension by recalling and verbalizing strategies to assist with active listening during conversation with  minA.    Time 2    Period Weeks    Status --   Ongong   Target Date 05/10/21      SLP SHORT TERM GOAL #2   Title Pt will comprehend functional memory or visual aids for recall of important information during structured conversations with minA.    Time 2    Period Weeks    Status Achieved    Target Date 05/10/21      SLP SHORT TERM GOAL #3   Title Pt will recall word finding strategies for anomia in conversation with minA    Time 2    Period Weeks    Status Not Met   Ongoing   Target Date 05/10/21              SLP Long Term Goals - 05/30/21 0827       SLP LONG TERM GOAL #1   Title Pt will increase auditory comprehension by recalling and verbalizing strategies to assist with active listening during unstructured conversation independently.    Time 4    Period Weeks    Status Not Met   Ongoing   Target Date 06/30/21      SLP LONG TERM GOAL #2   Title Pt will demonstrate understanding and verbalize use of functional memory/visual aids to recall important information in unstructured conversations independently.    Time 4    Period Weeks    Status Achieved      SLP LONG TERM GOAL #3   Title Pt will report successful use of word finding strategies in decreasing of instances of anomia during conversation.    Time 4    Period Weeks    Status Not Met   Continue   Target Date 06/30/21              Plan - 05/30/21 0826     Clinical Impression Statement See tx note. SLP to recertify pt for additional visits to provide patient with extra time to practice strategies for better generalization into daily life. Pt in agreement with POC.    Speech Therapy Frequency 1x /week    Duration 4 weeks    Treatment/Interventions Compensatory strategies;Cueing hierarchy;Functional tasks;Patient/family education;Environmental controls;Cognitive reorganization;Compensatory techniques;Internal/external aids;Language facilitation;Multimodal communcation approach    Potential to Achieve  Goals Good    Consulted and Agree with Plan of Care Patient             Patient will benefit from skilled therapeutic intervention in order to improve the following deficits and impairments:   Cognitive communication deficit    Problem List Patient Active Problem List   Diagnosis Date Noted   Hypercalcemia 04/03/2021   Hypokalemia 04/03/2021   History of subarachnoid hemorrhage 04/03/2021   Hyperparathyroidism (  [place]) 04/03/2021   SAH (subarachnoid hemorrhage) ([place]) 03/15/2021   Mixed hyperlipidemia 06/03/2018   Prediabetes 06/03/2018   S/P laparoscopic assisted vaginal hysterectomy (LAVH) 10/01/2016   Menorrhagia 09/10/2016   Iron deficiency anemia 05/06/2016   Acquired hypothyroidism 05/24/2013   Depression with anxiety 05/24/2013    Verdene Lennert, CCC-SLP 05/30/2021, 8:28 AM  [facility]. [place], [place], [address] Phone: [phone]   Fax:  [phone]   Name: Amanda Henry MRN: [id number] Date of Birth: 1966/03/26

## 2021-05-31 ENCOUNTER — Ambulatory Visit: Payer: BC Managed Care – PPO | Admitting: Physical Therapy

## 2021-06-05 ENCOUNTER — Ambulatory Visit: Payer: BC Managed Care – PPO | Admitting: Physical Therapy

## 2021-06-06 ENCOUNTER — Other Ambulatory Visit: Payer: Self-pay

## 2021-06-06 ENCOUNTER — Ambulatory Visit: Payer: BC Managed Care – PPO | Admitting: Speech Pathology

## 2021-06-06 ENCOUNTER — Encounter: Payer: Self-pay | Admitting: Speech Pathology

## 2021-06-06 DIAGNOSIS — R41841 Cognitive communication deficit: Secondary | ICD-10-CM

## 2021-06-06 NOTE — Therapy (Signed)
[facility]. [place], [place], [address] Phone: [phone]   Fax:  [phone]  Speech Language Pathology Treatment  Patient Details  Name: Amanda Henry MRN: [id number] Date of Birth: March 14, 1966 Referring Provider (SLP): [name] FNP   Encounter Date: 06/06/2021   End of Session - 06/06/21 1158     Visit Number 6    Date for SLP Re-Evaluation 07/25/21    Authorization - Visit Number 1    Authorization - Number of Visits 3    SLP Start Time 0936    SLP Stop Time  1015    SLP Time Calculation (min) 39 min    Activity Tolerance Patient tolerated treatment well             Past Medical History:  Diagnosis Date   Anxiety    Blood transfusion without reported diagnosis    Depression    Hypertension    no medications at this time   Hypothyroidism    Iron deficiency anemia    Thyroid disease     Past Surgical History:  Procedure Laterality Date   IR ANGIO INTRA EXTRACRAN SEL COM CAROTID INNOMINATE UNI L MOD SED  03/16/2021   IR ANGIO INTRA EXTRACRAN SEL COM CAROTID INNOMINATE UNI L MOD SED  03/26/2021   IR ANGIO INTRA EXTRACRAN SEL INTERNAL CAROTID UNI R MOD SED  03/15/2021   IR ANGIO INTRA EXTRACRAN SEL INTERNAL CAROTID UNI R MOD SED  03/26/2021   IR ANGIO INTRA EXTRACRAN SEL INTERNAL CAROTID UNI R MOD SED  03/25/2021   IR ANGIO VERTEBRAL SEL VERTEBRAL UNI L MOD SED  03/15/2021   IR ANGIO VERTEBRAL SEL VERTEBRAL UNI L MOD SED  03/26/2021   IR ANGIOGRAM FOLLOW UP STUDY  03/26/2021   IR ANGIOGRAM FOLLOW UP STUDY  03/26/2021   IR TRANSCATH/EMBOLIZ  03/26/2021   IR US GUIDE VASC ACCESS RIGHT  03/26/2021   LAPAROSCOPIC VAGINAL HYSTERECTOMY WITH SALPINGECTOMY Bilateral 10/01/2016   Procedure: LAPAROSCOPIC ASSISTED VAGINAL HYSTERECTOMY WITH SALPINGECTOMY;  Surgeon: Paula Compton, MD;  Location: [facility];  Service: Gynecology;  Laterality: Bilateral;   PARATHYROIDECTOMY Right 04/05/2021   Procedure: right  inferioir PARATHYROIDECTOMY;  Surgeon: Armandina Gemma, MD;  Location: [facility];  Service: General;  Laterality: Right;   RADIOLOGY WITH ANESTHESIA N/A 03/15/2021   Procedure: IR WITH ANESTHESIA;  Surgeon: Consuella Lose, MD;  Location: [facility];  Service: Radiology;  Laterality: N/A;   RADIOLOGY WITH ANESTHESIA N/A 03/26/2021   Procedure: IR WITH ANESTHESIA (Coiling and Embolization);  Surgeon: Consuella Lose, MD;  Location: [facility];  Service: Radiology;  Laterality: N/A;   WISDOM TOOTH EXTRACTION      There were no vitals filed for this visit.   Subjective Assessment - 06/06/21 0940     Subjective "I am feeling much better after applying some of the strategies consistently."    Currently in Pain? No/denies                   ADULT SLP TREATMENT - 06/06/21 1154       General Information   Behavior/Cognition Alert;Cooperative      Treatment Provided   Treatment provided Cognitive-Linquistic      Cognitive-Linquistic Treatment   Treatment focused on Cognition    Skilled Treatment Continued active listening skills/attention strategies to facilitate increased comprehension during conversations. Pt reports after implementing these strategies over the past week, she has felt much better. She reported some anxiety with driving and instrusive thoughts  about something bad happening. SLP encouraged pt to seek a counselor if she feels this continues to impact her. Discussed using breathing/relaxation strategies prior to driving to see if this is helpful.  She reports she is asking for repetition consistently and this has helped with processing of auditory information. We completed strategies today. To discuss d/c next week.      Assessment / Recommendations / Plan   Plan Continue with current plan of care      Progression Toward Goals   Progression toward goals Progressing toward goals                SLP Short Term Goals - 05/15/21 0981       SLP SHORT TERM GOAL #1   Title  Pt will increase auditory comprehension by recalling and verbalizing strategies to assist with active listening during conversation with minA.    Time 2    Period Weeks    Status --   Ongong   Target Date 05/10/21      SLP SHORT TERM GOAL #2   Title Pt will comprehend functional memory or visual aids for recall of important information during structured conversations with minA.    Time 2    Period Weeks    Status Achieved    Target Date 05/10/21      SLP SHORT TERM GOAL #3   Title Pt will recall word finding strategies for anomia in conversation with minA    Time 2    Period Weeks    Status Not Met   Ongoing   Target Date 05/10/21              SLP Long Term Goals - 05/30/21 0827       SLP LONG TERM GOAL #1   Title Pt will increase auditory comprehension by recalling and verbalizing strategies to assist with active listening during unstructured conversation independently.    Time 4    Period Weeks    Status Not Met   Ongoing   Target Date 06/30/21      SLP LONG TERM GOAL #2   Title Pt will demonstrate understanding and verbalize use of functional memory/visual aids to recall important information in unstructured conversations independently.    Time 4    Period Weeks    Status Achieved      SLP LONG TERM GOAL #3   Title Pt will report successful use of word finding strategies in decreasing of instances of anomia during conversation.    Time 4    Period Weeks    Status Not Met   Continue   Target Date 06/30/21              Plan - 06/06/21 1159     Clinical Impression Statement See tx note. Pt was able to independently recall strategies that she is currently using to assist with increased auditory comprehension of information.  Pt in agreement with POC.    Speech Therapy Frequency 1x /week    Duration 4 weeks    Treatment/Interventions Compensatory strategies;Cueing hierarchy;Functional tasks;Patient/family education;Environmental controls;Cognitive  reorganization;Compensatory techniques;Internal/external aids;Language facilitation;Multimodal communcation approach    Potential to Achieve Goals Good    Consulted and Agree with Plan of Care Patient             Patient will benefit from skilled therapeutic intervention in order to improve the following deficits and impairments:   Cognitive communication deficit    Problem List Patient Active Problem List   Diagnosis  Date Noted   Hypercalcemia 04/03/2021   Hypokalemia 04/03/2021   History of subarachnoid hemorrhage 04/03/2021   Hyperparathyroidism ([place]) 04/03/2021   SAH (subarachnoid hemorrhage) ([place]) 03/15/2021   Mixed hyperlipidemia 06/03/2018   Prediabetes 06/03/2018   S/P laparoscopic assisted vaginal hysterectomy (LAVH) 10/01/2016   Menorrhagia 09/10/2016   Iron deficiency anemia 05/06/2016   Acquired hypothyroidism 05/24/2013   Depression with anxiety 05/24/2013    Verdene Lennert, CCC-SLP 06/06/2021, 12:00 PM  [facility]. [place], [place], [address] Phone: [phone]   Fax:  [phone]   Name: Amanda Henry MRN: [id number] Date of Birth: 11-19-1965

## 2021-06-07 ENCOUNTER — Ambulatory Visit: Payer: BC Managed Care – PPO | Admitting: Physical Therapy

## 2021-06-12 ENCOUNTER — Encounter: Payer: Self-pay | Admitting: Speech Pathology

## 2021-06-12 ENCOUNTER — Other Ambulatory Visit: Payer: Self-pay

## 2021-06-12 ENCOUNTER — Ambulatory Visit: Payer: BC Managed Care – PPO | Admitting: Speech Pathology

## 2021-06-12 DIAGNOSIS — R41841 Cognitive communication deficit: Secondary | ICD-10-CM

## 2021-06-12 NOTE — Therapy (Signed)
[facility]. [place], [place], [address] Phone: [phone]   Fax:  [phone]  Speech Language Pathology Treatment  Patient Details  Name: Amanda Henry MRN: [id number] Date of Birth: Jan 10, 1966 Referring Provider (SLP): [name] FNP   Encounter Date: 06/12/2021   End of Session - 06/12/21 1026     Visit Number 7    Date for SLP Re-Evaluation 07/25/21    Authorization - Visit Number 2    Authorization - Number of Visits 3    SLP Start Time 0900    Activity Tolerance Patient tolerated treatment well             Past Medical History:  Diagnosis Date   Anxiety    Blood transfusion without reported diagnosis    Depression    Hypertension    no medications at this time   Hypothyroidism    Iron deficiency anemia    Thyroid disease     Past Surgical History:  Procedure Laterality Date   IR ANGIO INTRA EXTRACRAN SEL COM CAROTID INNOMINATE UNI L MOD SED  03/16/2021   IR ANGIO INTRA EXTRACRAN SEL COM CAROTID INNOMINATE UNI L MOD SED  03/26/2021   IR ANGIO INTRA EXTRACRAN SEL INTERNAL CAROTID UNI R MOD SED  03/15/2021   IR ANGIO INTRA EXTRACRAN SEL INTERNAL CAROTID UNI R MOD SED  03/26/2021   IR ANGIO INTRA EXTRACRAN SEL INTERNAL CAROTID UNI R MOD SED  03/25/2021   IR ANGIO VERTEBRAL SEL VERTEBRAL UNI L MOD SED  03/15/2021   IR ANGIO VERTEBRAL SEL VERTEBRAL UNI L MOD SED  03/26/2021   IR ANGIOGRAM FOLLOW UP STUDY  03/26/2021   IR ANGIOGRAM FOLLOW UP STUDY  03/26/2021   IR TRANSCATH/EMBOLIZ  03/26/2021   IR US GUIDE VASC ACCESS RIGHT  03/26/2021   LAPAROSCOPIC VAGINAL HYSTERECTOMY WITH SALPINGECTOMY Bilateral 10/01/2016   Procedure: LAPAROSCOPIC ASSISTED VAGINAL HYSTERECTOMY WITH SALPINGECTOMY;  Surgeon: Paula Compton, MD;  Location: [facility];  Service: Gynecology;  Laterality: Bilateral;   PARATHYROIDECTOMY Right 04/05/2021   Procedure: right inferioir PARATHYROIDECTOMY;  Surgeon: Armandina Gemma, MD;   Location: [facility];  Service: General;  Laterality: Right;   RADIOLOGY WITH ANESTHESIA N/A 03/15/2021   Procedure: IR WITH ANESTHESIA;  Surgeon: Consuella Lose, MD;  Location: [facility];  Service: Radiology;  Laterality: N/A;   RADIOLOGY WITH ANESTHESIA N/A 03/26/2021   Procedure: IR WITH ANESTHESIA (Coiling and Embolization);  Surgeon: Consuella Lose, MD;  Location: [facility];  Service: Radiology;  Laterality: N/A;   WISDOM TOOTH EXTRACTION      There were no vitals filed for this visit.   Subjective Assessment - 06/12/21 1024     Subjective "Everything is going good when I use the strategies".    Currently in Pain? No/denies                   ADULT SLP TREATMENT - 06/12/21 1309       General Information   Behavior/Cognition Alert;Cooperative      Treatment Provided   Treatment provided Cognitive-Linquistic      Cognitive-Linquistic Treatment   Treatment focused on Cognition    Skilled Treatment Reviewed strategies that she is currently using. Pt reports success with all strategies. Provided edu on time management strategies. Pt chose "pickle jar" theory which has person select rocks as "have to do", pebbles as "could do", and sand as "want to do". Pt made an itemized list and sorted them into the 3 categories.  Pt and SLP in agreement for discahrge.      Assessment / Recommendations / Plan   Plan All goals met      Progression Toward Goals   Progression toward goals Goals met, education completed, patient discharged from [facility] - 05/15/21 2263       SLP SHORT TERM GOAL #1   Title Pt will increase auditory comprehension by recalling and verbalizing strategies to assist with active listening during conversation with minA.    Time 2    Period Weeks    Status --   Ongong   Target Date 05/10/21      SLP SHORT TERM GOAL #2   Title Pt will comprehend functional memory or visual aids for recall of important information during  structured conversations with minA.    Time 2    Period Weeks    Status Achieved    Target Date 05/10/21      SLP SHORT TERM GOAL #3   Title Pt will recall word finding strategies for anomia in conversation with minA    Time 2    Period Weeks    Status Not Met   Ongoing   Target Date 05/10/21              SLP Long Term Goals - 05/30/21 0827       SLP LONG TERM GOAL #1   Title Pt will increase auditory comprehension by recalling and verbalizing strategies to assist with active listening during unstructured conversation independently.    Time 4    Period Weeks    Status Not Met   Ongoing   Target Date 06/30/21      SLP LONG TERM GOAL #2   Title Pt will demonstrate understanding and verbalize use of functional memory/visual aids to recall important information in unstructured conversations independently.    Time 4    Period Weeks    Status Achieved      SLP LONG TERM GOAL #3   Title Pt will report successful use of word finding strategies in decreasing of instances of anomia during conversation.    Time 4    Period Weeks    Status Not Met   Continue   Target Date 06/30/21              Plan - 06/12/21 1311     Clinical Impression Statement See tx note. All goals met. To discharge form therapy.    Speech Therapy Frequency 1x /week    Duration 4 weeks    Treatment/Interventions Compensatory strategies;Cueing hierarchy;Functional tasks;Patient/family education;Environmental controls;Cognitive reorganization;Compensatory techniques;Internal/external aids;Language facilitation;Multimodal communcation approach    Potential to Achieve Goals Good    Consulted and Agree with Plan of Care Patient             Patient will benefit from skilled therapeutic intervention in order to improve the following deficits and impairments:   Cognitive communication deficit    Problem List Patient Active Problem List   Diagnosis Date Noted   Hypercalcemia 04/03/2021    Hypokalemia 04/03/2021   History of subarachnoid hemorrhage 04/03/2021   Hyperparathyroidism ([facility] [facility]) 04/03/2021   SAH (subarachnoid hemorrhage) ([place]) 03/15/2021   Mixed hyperlipidemia 06/03/2018   Prediabetes 06/03/2018   S/P laparoscopic assisted vaginal hysterectomy (LAVH) 10/01/2016   Menorrhagia 09/10/2016   Iron deficiency anemia 05/06/2016   Acquired hypothyroidism 05/24/2013   Depression with anxiety 05/24/2013  Verdene Lennert, [place] 06/12/2021, 1:12 PM  [facility] [place], [place], [address] Phone: [phone]   Fax:  [phone]   Name: Amanda Henry MRN: [id number] Date of Birth: Nov 08, 1965

## 2021-06-13 ENCOUNTER — Encounter: Payer: Self-pay | Admitting: Nurse Practitioner

## 2021-06-18 LAB — HM MAMMOGRAPHY

## 2021-06-24 ENCOUNTER — Encounter: Payer: Self-pay | Admitting: Nurse Practitioner

## 2021-06-24 ENCOUNTER — Encounter: Payer: Self-pay | Admitting: Internal Medicine

## 2021-07-08 ENCOUNTER — Other Ambulatory Visit: Payer: Self-pay | Admitting: Obstetrics and Gynecology

## 2021-07-08 DIAGNOSIS — R928 Other abnormal and inconclusive findings on diagnostic imaging of breast: Secondary | ICD-10-CM

## 2021-07-12 ENCOUNTER — Encounter: Payer: Self-pay | Admitting: Internal Medicine

## 2021-07-12 ENCOUNTER — Ambulatory Visit: Payer: BC Managed Care – PPO | Admitting: Internal Medicine

## 2021-07-12 ENCOUNTER — Other Ambulatory Visit: Payer: Self-pay

## 2021-07-12 VITALS — BP 148/100 | HR 87 | Ht 67.5 in | Wt 198.0 lb

## 2021-07-12 DIAGNOSIS — E559 Vitamin D deficiency, unspecified: Secondary | ICD-10-CM | POA: Diagnosis not present

## 2021-07-12 DIAGNOSIS — E213 Hyperparathyroidism, unspecified: Secondary | ICD-10-CM | POA: Diagnosis not present

## 2021-07-12 DIAGNOSIS — E039 Hypothyroidism, unspecified: Secondary | ICD-10-CM

## 2021-07-12 LAB — VITAMIN D 25 HYDROXY (VIT D DEFICIENCY, FRACTURES): VITD: 26.81 ng/mL — ABNORMAL LOW (ref 30.00–100.00)

## 2021-07-12 LAB — TSH: TSH: 0.86 u[IU]/mL (ref 0.35–5.50)

## 2021-07-12 LAB — T4, FREE: Free T4: 0.98 ng/dL (ref 0.60–1.60)

## 2021-07-12 NOTE — Patient Instructions (Addendum)
Please continue: - Levothyroxine 150 mcg daily.  Take the thyroid hormone every day, with water, at least 30 minutes before breakfast, separated by at least 4 hours from: - acid reflux medications - calcium - iron - multivitamins  For now, stay off the vitamin D.  Please come back in 1 year.

## 2021-07-12 NOTE — Progress Notes (Signed)
Patient ID: Amanda Henry, female   DOB: 04/08/1966, 56 y.o.   MRN: [id number]   This visit occurred during the SARS-CoV-2 public health emergency.  Safety protocols were in place, including screening questions prior to the visit, additional usage of staff PPE, and extensive cleaning of exam room while observing appropriate contact time as indicated for disinfecting solutions.   HPI  Amanda Henry is a 56 y.o.-year-old female, initially referred referred by her PCP, [name], FNP, returning for f/u for hypercalcemia/hyperparathyroidism and also hypothyroidism. Last OV 3 mo ago.  Interim history: Before last visit, she described being very tired, having a whooshing sound in her head, tremor, fell out of bed  - lost consciousness, had urine loss and headache  - taken to the hospital >> found to have high blood pressure and a subarachnoid hemorrhage - this was coiled. While in the hospital, calcium was undetectably high (>15).  Parathyroid adenoma was resected while in-house in 03/2021.  Surprisingly, final pathology was benign. After surgery, she had persistent tingling throughout her body, but this resolved. Now feeling well, without complaints.  Pt was dx with hypercalcemia in 2018 and hyperparathyroidism in 05/2019. I reviewed pt's pertinent labs:  Component     Latest Ref Rng & Units 04/10/2021  Calcium Ionized     4.8 - 5.6 mg/dL 5.40   Lab Results  Component Value Date   PTH 311 (H) 04/03/2021   PTH Comment 04/03/2021   PTH 163 (H) 11/28/2020   PTH 163 (H) 06/14/2019   CALCIUM 9.1 04/15/2021   CALCIUM 9.7 04/08/2021   CALCIUM 10.3 04/07/2021   CALCIUM 11.1 (H) 04/06/2021   CALCIUM 10.7 (H) 04/06/2021   CALCIUM 12.3 (H) 04/05/2021   CALCIUM 13.9 (HH) 04/04/2021   CALCIUM >15.0 (HH) 04/03/2021   CALCIUM 15.8 (HH) 04/03/2021   CALCIUM >15.0 (HH) 04/03/2021   Lab Results  Component Value Date   PHOS 2.7 04/10/2021   PHOS 1.8 (L) 04/07/2021   PHOS <1.0 (LL) 04/05/2021    PHOS 1.7 (L) 04/03/2021   PHOS 3.0 04/01/2021   PHOS 1.6 (L) 11/28/2020   Component     Latest Ref Rng & Units 11/28/2020  Calcium     8.6 - 10.4 mg/dL 14.4 (HH)  Vitamin D 1, 25 (OH) Total     18 - 72 pg/mL 127 (H)  Vitamin D3 1, 25 (OH)     pg/mL 19  Vitamin D2 1, 25 (OH)     pg/mL 108  Vitamin D, 25-Hydroxy     30.0 - 100.0 ng/mL 32.6  PTH, Intact     15 - 65 pg/mL 163 (H)  Phosphorus     2.3 - 4.6 mg/dL 1.6 (L)  Magnesium     1.5 - 2.5 mg/dL 2.0   Component     Latest Ref Rng & Units 12/03/2020  Creatinine, 24H Ur     0.50 - 2.15 g/24 h 1.43  Calcium, 24H Urine     mg/24 h 390 (H)  FECa 0.216%  04/04/2021: Technetium sestamibi scan: There is a focus of persistent/delayed uptake in the lower right thyroid lobe consistent with a parathyroid adenoma. This appears to correlate with the CT and ultrasound findings of a 2 cm nodule in this area.  She had right inferior parathyroidectomy by Dr. Harlow Asa on 04/06/2021: A. PARATHYROID, RIGHT INFERIOR, ADENOMA, PARATHYROIDECTOMY:  - Parathyroid adenoma, 4.36 g.  See comment  - Scant thyroid tissue with lymphocytic thyroiditis - no evidence of  malignancy   The  parathyroid adenoma shows a few thick fibrous bands which is one of  the features of an atypical adenoma but it does not show other atypical  features such as necrosis, nuclear atypia or increased mitotic activity.  Dr. Saralyn Pilar reviewed the case and concurs with the diagnosis.  Clinical  correlation is suggested   No h/o osteoporosis.  No DXA scans available for review. No fractures or falls.  No h/o kidney stones.  No h/o CKD. Last BUN/Cr: Lab Results  Component Value Date   BUN 9 04/15/2021   BUN 11 04/08/2021   CREATININE 0.81 04/15/2021   CREATININE 0.73 04/08/2021   Of note, her alkaline phosphatase was elevated once, but at that time she also had a low vitamin D: Lab Results  Component Value Date   ALKPHOS 108 04/08/2021   ALKPHOS 97 04/07/2021    ALKPHOS 109 04/06/2021   ALKPHOS 118 04/03/2021   ALKPHOS 186 (H) 05/31/2019   ALKPHOS 79 09/10/2016   ALKPHOS 66 08/28/2016   ALKPHOS 76 05/28/2016   ALKPHOS 120 04/21/2016   ALKPHOS 78 02/14/2016   SPEP was normal: Component     Latest Ref Rng & Units 05/31/2019 06/14/2019  Total Protein     6.0 - 8.5 g/dL 7.3 7.3  Albumin ELP     2.9 - 4.4 g/dL 4.0 3.9  Alpha 1     0.0 - 0.4 g/dL 0.2 0.2  Alpha 2     0.4 - 1.0 g/dL 0.6 0.7  Beta     0.7 - 1.3 g/dL 1.0 1.1  Gamma Globulin     0.4 - 1.8 g/dL 1.5 1.5  M-SPIKE, %     Not Observed g/dL Not Observed Not Observed  Globulin, Total     2.2 - 3.9 g/dL 3.3 3.4  A/G Ratio     0.7 - 1.7 1.2 1.1   Pt is not on HCTZ.  Vitamin D deficiency:  Reviewed vit D levels: Lab Results  Component Value Date   VD25OH 113.24 (H) 04/03/2021   VD25OH 32.6 11/28/2020   VD25OH 24.6 (L) 10/19/2020   VD25OH 17.2 (L) 09/25/2020   VD25OH 28.3 (L) 02/14/2020   VD25OH 23.3 (L) 11/28/2019   VD25OH 12.4 (L) 05/31/2019   Pt is on ergocalciferol 50,000 units once a week, but she stopped in 03/2021 - restarted this am.  Pt does not have a FH of hypercalcemia, pituitary tumors, thyroid cancer, or osteoporosis.   Hypothyroidism:  - s/p RAI tx for Graves 1991  She continues on levothyroxine 150 mcg daily (decreased from 175 mg daily in 01/2020): - in am - fasting - at least 30 min from b'fast - + Ca 200 mg 2x a day  >> Tums tablet in am (!) - midday - no Fe - off MVI - no PPIs  - stopped Biotin  Reviewed her TFTs : Lab Results  Component Value Date   TSH 0.403 04/04/2021   TSH 2.090 10/19/2020   TSH 14.800 (H) 09/25/2020   TSH 0.33 (L) 02/14/2020   TSH 0.219 (L) 11/28/2019   TSH 0.622 05/31/2019   TSH 1.900 10/21/2018   TSH 6.510 (H) 09/07/2018   TSH 25.010 (H) 06/03/2018   Lab Results  Component Value Date   FREET4 1.38 10/19/2020   FREET4 1.12 02/14/2020   FREET4 1.17 09/07/2018   FREET4 0.41 (L) 06/03/2018   Lab Results   Component Value Date   T3FREE 1.4 (L) 09/25/2020   T3FREE 2.7 11/28/2019   T3FREE 2.7 05/31/2019  T3FREE 2.6 10/21/2018   Pt denies: - feeling nodules in neck - hoarseness - dysphagia - choking - SOB with lying down  She has family history of thyroid disease in M and M uncles.  No Fh of thyroid cancer. + h/o radiation tx to head or neck - in 1991 (RAI for Graves).  No herbal supplements. No recent steroids use.   ROS: + See HPI  I reviewed pt's medications, allergies, PMH, social hx, family hx, and changes were documented in the history of present illness. Otherwise, unchanged from my initial visit note.  Past Medical History:  Diagnosis Date   Anxiety    Blood transfusion without reported diagnosis    Depression    Hypertension    no medications at this time   Hypothyroidism    Iron deficiency anemia    Thyroid disease    Past Surgical History:  Procedure Laterality Date   IR ANGIO INTRA EXTRACRAN SEL COM CAROTID INNOMINATE UNI L MOD SED  03/16/2021   IR ANGIO INTRA EXTRACRAN SEL COM CAROTID INNOMINATE UNI L MOD SED  03/26/2021   IR ANGIO INTRA EXTRACRAN SEL INTERNAL CAROTID UNI R MOD SED  03/15/2021   IR ANGIO INTRA EXTRACRAN SEL INTERNAL CAROTID UNI R MOD SED  03/26/2021   IR ANGIO INTRA EXTRACRAN SEL INTERNAL CAROTID UNI R MOD SED  03/25/2021   IR ANGIO VERTEBRAL SEL VERTEBRAL UNI L MOD SED  03/15/2021   IR ANGIO VERTEBRAL SEL VERTEBRAL UNI L MOD SED  03/26/2021   IR ANGIOGRAM FOLLOW UP STUDY  03/26/2021   IR ANGIOGRAM FOLLOW UP STUDY  03/26/2021   IR TRANSCATH/EMBOLIZ  03/26/2021   IR US GUIDE VASC ACCESS RIGHT  03/26/2021   LAPAROSCOPIC VAGINAL HYSTERECTOMY WITH SALPINGECTOMY Bilateral 10/01/2016   Procedure: LAPAROSCOPIC ASSISTED VAGINAL HYSTERECTOMY WITH SALPINGECTOMY;  Surgeon: Paula Compton, MD;  Location: [facility];  Service: Gynecology;  Laterality: Bilateral;   PARATHYROIDECTOMY Right 04/05/2021   Procedure: right inferioir PARATHYROIDECTOMY;  Surgeon: Armandina Gemma, MD;  Location: [facility];  Service: General;  Laterality: Right;   RADIOLOGY WITH ANESTHESIA N/A 03/15/2021   Procedure: IR WITH ANESTHESIA;  Surgeon: Consuella Lose, MD;  Location: [facility];  Service: Radiology;  Laterality: N/A;   RADIOLOGY WITH ANESTHESIA N/A 03/26/2021   Procedure: IR WITH ANESTHESIA (Coiling and Embolization);  Surgeon: Consuella Lose, MD;  Location: [facility];  Service: Radiology;  Laterality: N/A;   WISDOM TOOTH EXTRACTION     Social History   Socioeconomic History   Marital status: Single    Spouse name: Not on file   Number of children: 1   Years of education: Not on file   Highest education level: Not on file  Occupational History   Occupation: Financial planner  Tobacco Use   Smoking status: Never   Smokeless tobacco: Never  Substance and Sexual Activity   Alcohol use: No   Drug use: No   Sexual activity: Yes    Birth control/protection: Condom  Other Topics Concern   Not on file  Social History Narrative   Not on file   Social Determinants of Health   Financial Resource Strain: Not on file  Food Insecurity: Not on file  Transportation Needs: Not on file  Physical Activity: Not on file  Stress: Not on file  Social Connections: Not on file  Intimate Partner Violence: Not on file   Current Outpatient Medications on File Prior to Visit  Medication Sig Dispense Refill   acetaminophen (TYLENOL) 500 MG tablet  Take 1,000 mg by mouth every 6 (six) hours as needed for moderate pain or headache.     ALPRAZolam (XANAX XR) 1 MG 24 hr tablet Take 1 mg by mouth daily as needed for sleep.     escitalopram (LEXAPRO) 10 MG tablet Take 1 tablet (10 mg total) by mouth daily. (Patient taking differently: Take 10 mg by mouth at bedtime.) 30 tablet 1   ipratropium (ATROVENT) 0.06 % nasal spray USE 2 SPRAYS IN EACH NOSTRIL THREE TIMES DAILY (Patient taking differently: Place 2 sprays into both nostrils 2 (two) times daily as needed for rhinitis.) 15 mL 2    levothyroxine (SYNTHROID) 150 MCG tablet TAKE 1 TABLET(150 MCG) BY MOUTH DAILY BEFORE BREAKFAST 45 tablet 1   meclizine (ANTIVERT) 12.5 MG tablet Take 1 tablet (12.5 mg total) by mouth 3 (three) times daily as needed for dizziness. 30 tablet 0   metoprolol tartrate (LOPRESSOR) 25 MG tablet Take 1 tablet (25 mg total) by mouth daily. 90 tablet 0   potassium chloride SA (KLOR-CON) 20 MEQ tablet Take 1 tablet (20 mEq total) by mouth daily. 30 tablet 0   Vitamin D, Ergocalciferol, (DRISDOL) 1.25 MG (50000 UNIT) CAPS capsule TAKE 1 CAPSULE BY MOUTH 2 TIMES A WEEK (Patient taking differently: Take 50,000 Units by mouth See admin instructions. Monday and wednesday) 24 capsule 1   No current facility-administered medications on file prior to visit.   Allergies  Allergen Reactions   Latex Rash   Penicillins Rash    Has patient had a PCN reaction causing immediate rash, facial/tongue/throat swelling, SOB or lightheadedness with hypotension: Yes Has patient had a PCN reaction causing severe rash involving mucus membranes or skin necrosis: No Has patient had a PCN reaction that required hospitalization No Has patient had a PCN reaction occurring within the last 10 years: No If all of the above answers are "NO", then may proceed with Cephalosporin use.    Family History  Problem Relation Age of Onset   Thyroid disease Mother    Hypertension Mother    Diabetes Father    Hypertension Other    Diabetes Other    Thyroid disease Other    Heart disease Other    PE: BP (!) 148/100 (BP Location: Left Arm, Patient Position: Sitting, Cuff Size: Normal)    Pulse 87    Ht 5' 7.5" (1.715 m)    Wt 198 lb (89.8 kg)    LMP 08/28/2016 (Exact Date) Comment: come back on 09/15/2016   SpO2 97%    BMI 30.55 kg/m  Wt Readings from Last 3 Encounters:  07/12/21 198 lb (89.8 kg)  04/22/21 181 lb (82.1 kg)  04/15/21 183 lb (83 kg)   Constitutional: overweight, in NAD. No kyphosis. Eyes: PERRLA, EOMI, + bilateral  symmetric exophthalmos, no lid lag ENT: moist mucous membranes, no thyromegaly, no cervical lymphadenopathy, + cervical scar healed with a little keloid on the left half of the scar Cardiovascular: RRR, No MRG Respiratory: CTA B Musculoskeletal: no deformities, strength intact in all 4 Skin: moist, warm, no rashes Neurological: no tremor with outstretched hands, DTR normal in all 4  Assessment: 1. Hypercalcemia/hyperparathyroidism  2.  Acquired Hypothyroidism  3.  Vitamin D deficiency  Plan: Patient with a history of severe hypercalcemia and vitamin D deficiency, with previously undetectably high calcium (!) -She has a history of constipation, but no history of nephrolithiasis, osteoporosis, fractures, abdominal pain, bone pain. -We have tried to direct her to surgery and at last visit, she  finally went and saw Dr. Harlow Asa recently -She recently was scheduled for presurgical evaluation before her parathyroid surgery, however, she was admitted for [facility] recently after having had what appears to be a seizure.  While in the hospital her calcium was undetectably high again.  She was hydrated, given bisphosphonates and she had a technetium sestamibi scan on 04/04/2021 which showed a parathyroid adenoma of the right in the right inferior part of the thyroid.  She had parathyroidectomy by Dr. Harlow Asa on 04/06/2021.  Final pathology showed a parathyroid adenoma, not carcinoma. -After her right inferior parathyroidectomy, her calcium normalized.  Phosphorus was very low in the hospital but normalized. -She was started on calcium after surgery, 200 mg twice a day.  She could not find this so we started 1 Tums a day with a meal.  She continues on this now -At last visit she still had occasional tingling in her body and I advised her to take an extra calcium in those situations -At last visit, ionized calcium and phosphorus levels were both normal: At 5.4 (4.8-5.6) and 2.7 (2.5-4.5), respectively. -At  today's visit, we will recheck her calcium and PTH levels -She has a little keloid on the left half of the scar-I recommended kelo-cote strips - I will see the patient back in 1 year  2.  Acquired hypothyroidism - latest thyroid labs reviewed with pt. >> normal: Lab Results  Component Value Date   TSH 0.403 04/04/2021  - she continues on LT4 150 mcg daily - pt feels good on this dose. - we discussed about taking the thyroid hormone every day, with water, >30 minutes before breakfast, separated by >4 hours from acid reflux medications, calcium, iron, multivitamins. Pt. is taking it correctly. - will check thyroid tests today  3.  Vitamin D deficiency -Vitamin D level was as low as 12.4 in the past -At last visit, she was on ergocalciferol 50,000 units twice a week, but vitamin D level was high, at 117.24, so I advised her to stop for 2 weeks and then restart ergocalciferol only once a week.  However, at this visit she tells me that she stopped ergocalciferol completely and the first dose that she took since then was this morning -We will recheck her vitamin D level today  Component     Latest Ref Rng & Units 07/12/2021          Calcium     8.7 - 10.2 mg/dL 9.2  PTH, Intact     15 - 65 pg/mL 26  PTH Interp      Comment  TSH     0.35 - 5.50 uIU/mL 0.86  T4,Free(Direct)     0.60 - 1.60 ng/dL 0.98  VITD     30.00 - 100.00 ng/mL 26.81 (L)  Vitamin D is low, while the rest of the labs are normal.  Continue ergocalciferol 50,000 units weekly, which she just restarted in the day of our appointment.  Philemon Kingdom, MD PhD [facility]

## 2021-07-13 LAB — PTH, INTACT AND CALCIUM
Calcium: 9.2 mg/dL (ref 8.7–10.2)
PTH: 26 pg/mL (ref 15–65)

## 2021-07-15 ENCOUNTER — Other Ambulatory Visit: Payer: Self-pay

## 2021-07-15 DIAGNOSIS — E039 Hypothyroidism, unspecified: Secondary | ICD-10-CM

## 2021-07-15 MED ORDER — LEVOTHYROXINE SODIUM 150 MCG PO TABS: ORAL_TABLET | ORAL | 1 refills | Status: DC

## 2021-07-17 ENCOUNTER — Other Ambulatory Visit: Payer: Self-pay | Admitting: Nurse Practitioner

## 2021-07-17 DIAGNOSIS — Z8679 Personal history of other diseases of the circulatory system: Secondary | ICD-10-CM

## 2021-08-26 ENCOUNTER — Encounter: Payer: Self-pay | Admitting: Nurse Practitioner

## 2021-08-26 ENCOUNTER — Ambulatory Visit: Payer: BC Managed Care – PPO | Admitting: Nurse Practitioner

## 2021-08-26 VITALS — BP 140/90 | HR 61 | Temp 98.0°F | Ht 67.5 in | Wt 208.0 lb

## 2021-08-26 DIAGNOSIS — I10 Essential (primary) hypertension: Secondary | ICD-10-CM | POA: Diagnosis not present

## 2021-08-26 DIAGNOSIS — E782 Mixed hyperlipidemia: Secondary | ICD-10-CM

## 2021-08-26 DIAGNOSIS — E559 Vitamin D deficiency, unspecified: Secondary | ICD-10-CM

## 2021-08-26 DIAGNOSIS — Z6832 Body mass index (BMI) 32.0-32.9, adult: Secondary | ICD-10-CM

## 2021-08-26 DIAGNOSIS — R7303 Prediabetes: Secondary | ICD-10-CM

## 2021-08-26 DIAGNOSIS — J302 Other seasonal allergic rhinitis: Secondary | ICD-10-CM

## 2021-08-26 DIAGNOSIS — E039 Hypothyroidism, unspecified: Secondary | ICD-10-CM

## 2021-08-26 DIAGNOSIS — Z8679 Personal history of other diseases of the circulatory system: Secondary | ICD-10-CM

## 2021-08-26 DIAGNOSIS — I158 Other secondary hypertension: Secondary | ICD-10-CM

## 2021-08-26 DIAGNOSIS — E6609 Other obesity due to excess calories: Secondary | ICD-10-CM

## 2021-08-26 MED ORDER — METOPROLOL TARTRATE 50 MG PO TABS: 50.0000 mg | ORAL_TABLET | Freq: Every day | ORAL | 1 refills | Status: DC

## 2021-08-26 MED ORDER — IPRATROPIUM BROMIDE 0.06 % NA SOLN: 2.0000 | Freq: Three times a day (TID) | NASAL | 2 refills | Status: DC

## 2021-08-26 MED ORDER — VITAMIN D (ERGOCALCIFEROL) 1.25 MG (50000 UNIT) PO CAPS: 50000.0000 [IU] | ORAL_CAPSULE | ORAL | 1 refills | Status: DC

## 2021-08-26 NOTE — Patient Instructions (Signed)

## 2021-08-26 NOTE — Progress Notes (Signed)
?Industrial/product designer as a Education administrator for [employer], FNP.,have documented all relevant documentation on the behalf of [name], FNP,as directed by  [name], FNP while in the presence of [name], [place]. ? ?This visit occurred during the SARS-CoV-2 public health emergency.  Safety protocols were in place, including screening questions prior to the visit, additional usage of staff PPE, and extensive cleaning of exam room while observing appropriate contact time as indicated for disinfecting solutions. ? ?Subjective:  ?  ? Patient ID: Amanda Henry , female    DOB: 02-28-1966 , 56 y.o.   MRN: [id number] ? ? ?Chief Complaint  ?Patient presents with  ? Hypertension  ? ? ?HPI ? ?Patient presents today for follow up for elevated blood pressure. She has taken the rest of the year off and will start back in the fall. She has been out of work since September. This weekend she did eat foods high in salt.  ? ?Wt Readings from Last 3 Encounters: ?08/26/21 : 208 lb (94.3 kg) ?07/12/21 : 198 lb (89.8 kg) ?04/22/21 : 181 lb (82.1 kg) ? ? ? ?Hypertension ?This is a chronic problem. Pertinent negatives include no anxiety. There are no associated agents to hypertension. Risk factors for coronary artery disease include obesity and sedentary lifestyle. Compliance problems include exercise.  There is no history of angina. There is no history of chronic renal disease.   ? ?Past Medical History:  ?Diagnosis Date  ? Anxiety   ? Blood transfusion without reported diagnosis   ? Depression   ? Hypertension   ? no medications at this time  ? Hypothyroidism   ? Iron deficiency anemia   ? Thyroid disease   ?  ? ?Family History  ?Problem Relation Age of Onset  ? Thyroid disease Mother   ? Hypertension Mother   ? Diabetes Father   ? Hypertension Other   ? Diabetes Other   ? Thyroid disease Other   ? Heart disease Other   ? ? ? ?Current Outpatient Medications:  ?  acetaminophen (TYLENOL) 500 MG tablet, Take 1,000 mg by mouth every 6  (six) hours as needed for moderate pain or headache., Disp: , Rfl:  ?  ALPRAZolam (XANAX XR) 1 MG 24 hr tablet, Take 1 mg by mouth daily as needed for sleep., Disp: , Rfl:  ?  escitalopram (LEXAPRO) 10 MG tablet, Take 1 tablet (10 mg total) by mouth daily. (Patient taking differently: Take 10 mg by mouth at bedtime.), Disp: 30 tablet, Rfl: 1 ?  ipratropium (ATROVENT) 0.06 % nasal spray, Place 2 sprays into the nose 3 (three) times daily., Disp: 15 mL, Rfl: 2 ?  levothyroxine (SYNTHROID) 150 MCG tablet, TAKE 1 TABLET(150 MCG) BY MOUTH DAILY BEFORE BREAKFAST, Disp: 45 tablet, Rfl: 1 ?  meclizine (ANTIVERT) 12.5 MG tablet, Take 1 tablet (12.5 mg total) by mouth 3 (three) times daily as needed for dizziness., Disp: 30 tablet, Rfl: 0 ?  Vitamin D, Ergocalciferol, (DRISDOL) 1.25 MG (50000 UNIT) CAPS capsule, Take 1 capsule (50,000 Units total) by mouth every 7 (seven) days., Disp: 12 capsule, Rfl: 1 ?  metoprolol tartrate (LOPRESSOR) 50 MG tablet, Take 1 tablet (50 mg total) by mouth daily., Disp: 90 tablet, Rfl: 1  ? ?Allergies  ?Allergen Reactions  ? Latex Rash  ? Penicillins Rash  ?  Has patient had a PCN reaction causing immediate rash, facial/tongue/throat swelling, SOB or lightheadedness with hypotension: Yes ?Has patient had a PCN reaction causing severe rash involving mucus membranes or  skin necrosis: No ?Has patient had a PCN reaction that required hospitalization No ?Has patient had a PCN reaction occurring within the last 10 years: No ?If all of the above answers are "NO", then may proceed with Cephalosporin use. ?  ?  ? ?Review of Systems  ?Constitutional: Negative.   ?Respiratory: Negative.    ?Cardiovascular: Negative.   ?Gastrointestinal: Negative.   ?Neurological: Negative.    ? ?Today's Vitals  ? 08/26/21 1640  ?BP: 140/90  ?Pulse: 61  ?Temp: 98 ?F (36.7 ?C)  ?TempSrc: Oral  ?Weight: 208 lb (94.3 kg)  ?Height: 5' 7.5" (1.715 m)  ? ?Body mass index is 32.1 kg/m?.  ?Wt Readings from Last 3 Encounters:   ?08/26/21 208 lb (94.3 kg)  ?07/12/21 198 lb (89.8 kg)  ?04/22/21 181 lb (82.1 kg)  ? ? ?Objective:  ?Physical Exam ?Vitals reviewed.  ?Constitutional:   ?   General: She is not in acute distress. ?   Appearance: Normal appearance. She is obese.  ?Cardiovascular:  ?   Rate and Rhythm: Normal rate and regular rhythm.  ?   Pulses: Normal pulses.  ?   Heart sounds: Normal heart sounds. No murmur heard. ?Pulmonary:  ?   Effort: Pulmonary effort is normal. No respiratory distress.  ?   Breath sounds: Normal breath sounds. No wheezing.  ?Skin: ?   General: Skin is warm and dry.  ?   Capillary Refill: Capillary refill takes less than 2 seconds.  ?Neurological:  ?   General: No focal deficit present.  ?   Mental Status: She is alert and oriented to person, place, and time.  ?   Cranial Nerves: No cranial nerve deficit.  ?   Motor: No weakness.  ?Psychiatric:     ?   Mood and Affect: Mood normal.     ?   Behavior: Behavior normal.     ?   Thought Content: Thought content normal.     ?   Judgment: Judgment normal.  ?  ? ?   ?Assessment And Plan:  ?   ?1. Essential hypertension ?Comments: Blood pressure is slightly elevated this visit encouraged to limit intake of high salt foods and to increase physical activity. She has gained approx 20 lbs ?- metoprolol tartrate (LOPRESSOR) 50 MG tablet; Take 1 tablet (50 mg total) by mouth daily.  Dispense: 90 tablet; Refill: 1 ? ?2. Acquired hypothyroidism ?Comments: Continue follow up with Endocrinology ?- BMP8+EGFR ? ?3. Mixed hyperlipidemia ?Comments: Stable, diet controlled. Continue to limit intake of fried and fatty foods ?- Lipid panel ? ?4. Prediabetes ?Comments: Stable, diet controlled ?- BMP8+EGFR ?- Hemoglobin A1c ? ?5. Seasonal allergies ?Comments: Encouraged to take over the counter antihistamine and will treat with ipratropium nasal spray ?- ipratropium (ATROVENT) 0.06 % nasal spray; Place 2 sprays into the nose 3 (three) times daily.  Dispense: 15 mL; Refill: 2 ? ?6.  Vitamin D deficiency ?Will check vitamin D level and supplement as needed.    ?Also encouraged to spend 15 minutes in the sun daily.  ?- Vitamin D, Ergocalciferol, (DRISDOL) 1.25 MG (50000 UNIT) CAPS capsule; Take 1 capsule (50,000 Units total) by mouth every 7 (seven) days.  Dispense: 12 capsule; Refill: 1 ? ?7. Class 1 obesity due to excess calories without serious comorbidity with body mass index (BMI) of 32.0 to 32.9 in adult ?Chronic ?Discussed healthy diet and regular exercise options  ?Encouraged to exercise at least 150 minutes per week with 2 days of strength training ? ?  8. History of subarachnoid hemorrhage ?Comments: Continues to be out of work until the end of the year by the Neurosurgeon ?- metoprolol tartrate (LOPRESSOR) 50 MG tablet; Take 1 tablet (50 mg total) by mouth daily.  Dispense: 90 tablet; Refill: 1 ? She is encouraged to strive for BMI less than 30 to decrease cardiac risk. Advised to aim for at least 150 minutes of exercise per week. ? ? ? ?Patient was given opportunity to ask questions. Patient verbalized understanding of the plan and was able to repeat key elements of the plan. All questions were answered to their satisfaction.  ?[name], FNP  ? ? ?I, [name], FNP, have reviewed all documentation for this visit. The documentation on 08/26/21 for the exam, diagnosis, procedures, and orders are all accurate and complete.  ? ?IF YOU HAVE BEEN REFERRED TO A SPECIALIST, IT MAY TAKE 1-2 WEEKS TO SCHEDULE/PROCESS THE REFERRAL. IF YOU HAVE NOT HEARD FROM US/SPECIALIST IN TWO WEEKS, PLEASE GIVE [place] A CALL AT [phone].  ? ?THE PATIENT IS ENCOURAGED TO PRACTICE SOCIAL DISTANCING DUE TO THE COVID-19 PANDEMIC.   ?

## 2021-08-27 LAB — BMP8+EGFR
BUN/Creatinine Ratio: 18 (ref 9–23)
BUN: 13 mg/dL (ref 6–24)
CO2: 25 mmol/L (ref 20–29)
Calcium: 9.9 mg/dL (ref 8.7–10.2)
Chloride: 101 mmol/L (ref 96–106)
Creatinine, Ser: 0.74 mg/dL (ref 0.57–1.00)
Glucose: 156 mg/dL — ABNORMAL HIGH (ref 70–99)
Potassium: 4.2 mmol/L (ref 3.5–5.2)
Sodium: 139 mmol/L (ref 134–144)
eGFR: 96 mL/min/{1.73_m2} (ref >59)

## 2021-08-27 LAB — HEMOGLOBIN A1C
Est. average glucose Bld gHb Est-mCnc: 177 mg/dL
Hgb A1c MFr Bld: 7.8 % — ABNORMAL HIGH (ref 4.8–5.6)

## 2021-08-27 LAB — LIPID PANEL
Chol/HDL Ratio: 4.3 ratio (ref 0.0–4.4)
Cholesterol, Total: 237 mg/dL — ABNORMAL HIGH (ref 100–199)
HDL: 55 mg/dL (ref >39)
LDL Chol Calc (NIH): 150 mg/dL — ABNORMAL HIGH (ref 0–99)
Triglycerides: 177 mg/dL — ABNORMAL HIGH (ref 0–149)
VLDL Cholesterol Cal: 32 mg/dL (ref 5–40)

## 2021-09-08 MED ORDER — METFORMIN HCL 500 MG PO TABS: 500.0000 mg | ORAL_TABLET | Freq: Two times a day (BID) | ORAL | 2 refills | Status: DC

## 2021-09-24 ENCOUNTER — Other Ambulatory Visit: Payer: Self-pay | Admitting: Nurse Practitioner

## 2021-09-24 DIAGNOSIS — E782 Mixed hyperlipidemia: Secondary | ICD-10-CM

## 2021-09-24 MED ORDER — ATORVASTATIN CALCIUM 20 MG PO TABS: 20.0000 mg | ORAL_TABLET | Freq: Every day | ORAL | 2 refills | Status: DC

## 2021-10-04 ENCOUNTER — Other Ambulatory Visit: Payer: Self-pay | Admitting: Neurosurgery

## 2021-10-04 DIAGNOSIS — I609 Nontraumatic subarachnoid hemorrhage, unspecified: Secondary | ICD-10-CM

## 2021-10-11 ENCOUNTER — Emergency Department (HOSPITAL_COMMUNITY): Payer: BC Managed Care – PPO

## 2021-10-11 ENCOUNTER — Emergency Department (HOSPITAL_COMMUNITY)
Admission: EM | Admit: 2021-10-11 | Discharge: 2021-10-12 | Disposition: A | Discharge status: Home / Self Care | Payer: BC Managed Care – PPO | Attending: Emergency Medicine | Admitting: Emergency Medicine

## 2021-10-11 ENCOUNTER — Encounter (HOSPITAL_COMMUNITY): Payer: Self-pay

## 2021-10-11 ENCOUNTER — Other Ambulatory Visit: Payer: Self-pay

## 2021-10-11 DIAGNOSIS — E039 Hypothyroidism, unspecified: Secondary | ICD-10-CM | POA: Insufficient documentation

## 2021-10-11 DIAGNOSIS — I10 Essential (primary) hypertension: Secondary | ICD-10-CM | POA: Diagnosis not present

## 2021-10-11 DIAGNOSIS — R42 Dizziness and giddiness: Secondary | ICD-10-CM | POA: Diagnosis present

## 2021-10-11 MED ORDER — ONDANSETRON 4 MG PO TBDP
4.0000 mg | ORAL_TABLET | Freq: Once | ORAL | Status: AC
  Administered 2021-10-12: 4 mg via ORAL
  Filled 2021-10-11: qty 1

## 2021-10-11 MED ORDER — HYDRALAZINE HCL 25 MG PO TABS
50.0000 mg | ORAL_TABLET | Freq: Once | ORAL | Status: AC
  Administered 2021-10-12: 50 mg via ORAL
  Filled 2021-10-11: qty 2

## 2021-10-11 NOTE — ED Provider Notes (Signed)
[facility] Emergency Department Provider Note MRN:  [id number]  Arrival date & time: 10/12/21     Chief Complaint   Nausea History of Present Illness   Amanda Henry is a 56 y.o. year-old female with a history of subarachnoid hemorrhage presenting to the ED with chief complaint of nausea.  Nausea and lightheadedness that she noticed today at around 11 AM, persistent throughout the day.  Has noted high blood pressures throughout the day as well.  No headache, no room spinning sensation, no neck or back pain, no chest pain or shortness of breath, no abdominal pain, no numbness or weakness to the arms or legs.  Review of Systems  A thorough review of systems was obtained and all systems are negative except as noted in the HPI and PMH.   Patient's Health History    Past Medical History:  Diagnosis Date   Anxiety    Blood transfusion without reported diagnosis    Depression    Hypertension    no medications at this time   Hypothyroidism    Iron deficiency anemia    Thyroid disease     Past Surgical History:  Procedure Laterality Date   IR ANGIO INTRA EXTRACRAN SEL COM CAROTID INNOMINATE UNI L MOD SED  03/16/2021   IR ANGIO INTRA EXTRACRAN SEL COM CAROTID INNOMINATE UNI L MOD SED  03/26/2021   IR ANGIO INTRA EXTRACRAN SEL INTERNAL CAROTID UNI R MOD SED  03/15/2021   IR ANGIO INTRA EXTRACRAN SEL INTERNAL CAROTID UNI R MOD SED  03/26/2021   IR ANGIO INTRA EXTRACRAN SEL INTERNAL CAROTID UNI R MOD SED  03/25/2021   IR ANGIO VERTEBRAL SEL VERTEBRAL UNI L MOD SED  03/15/2021   IR ANGIO VERTEBRAL SEL VERTEBRAL UNI L MOD SED  03/26/2021   IR ANGIOGRAM FOLLOW UP STUDY  03/26/2021   IR ANGIOGRAM FOLLOW UP STUDY  03/26/2021   IR TRANSCATH/EMBOLIZ  03/26/2021   IR US GUIDE VASC ACCESS RIGHT  03/26/2021   LAPAROSCOPIC VAGINAL HYSTERECTOMY WITH SALPINGECTOMY Bilateral 10/01/2016   Procedure: LAPAROSCOPIC ASSISTED VAGINAL HYSTERECTOMY WITH SALPINGECTOMY;  Surgeon:  Paula Compton, MD;  Location: [facility];  Service: Gynecology;  Laterality: Bilateral;   PARATHYROIDECTOMY Right 04/05/2021   Procedure: right inferioir PARATHYROIDECTOMY;  Surgeon: Armandina Gemma, MD;  Location: [facility];  Service: General;  Laterality: Right;   RADIOLOGY WITH ANESTHESIA N/A 03/15/2021   Procedure: IR WITH ANESTHESIA;  Surgeon: Consuella Lose, MD;  Location: [place];  Service: Radiology;  Laterality: N/A;   RADIOLOGY WITH ANESTHESIA N/A 03/26/2021   Procedure: IR WITH ANESTHESIA (Coiling and Embolization);  Surgeon: Consuella Lose, MD;  Location: [place];  Service: Radiology;  Laterality: N/A;   WISDOM TOOTH EXTRACTION      Family History  Problem Relation Age of Onset   Thyroid disease Mother    Hypertension Mother    Diabetes Father    Hypertension Other    Diabetes Other    Thyroid disease Other    Heart disease Other     Social History   Socioeconomic History   Marital status: Single    Spouse name: Not on file   Number of children: 1   Years of education: Not on file   Highest education level: Not on file  Occupational History   Occupation: Elementary school educator  Tobacco Use   Smoking status: Never   Smokeless tobacco: Never  Substance and Sexual Activity   Alcohol use: No   Drug use: No   Sexual  activity: Yes    Birth control/protection: Condom  Other Topics Concern   Not on file  Social History Narrative   Not on file   Social Determinants of Health   Financial Resource Strain: Not on file  Food Insecurity: Not on file  Transportation Needs: Not on file  Physical Activity: Not on file  Stress: Not on file  Social Connections: Not on file  Intimate Partner Violence: Not on file     Physical Exam   Vitals:   10/12/21 0030 10/12/21 0100  BP: (!) 217/101 (!) 191/106  Pulse: (!) 54 (!) 51  Resp: 17 16  Temp:    SpO2: 94% 97%    CONSTITUTIONAL: Well-appearing, NAD NEURO/PSYCH:  Alert and oriented x 3, normal and symmetric  strength and sensation, normal coordination, normal speech EYES:  eyes equal and reactive ENT/NECK:  no LAD, no JVD CARDIO: Regular rate, well-perfused, normal S1 and S2 PULM:  CTAB no wheezing or rhonchi GI/GU:  non-distended, non-tender MSK/SPINE:  No gross deformities, no edema SKIN:  no rash, atraumatic   *Additional and/or pertinent findings included in MDM below  Diagnostic and Interventional Summary    EKG Interpretation  Date/Time:  Friday Oct 11 2021 23:43:48 EDT Ventricular Rate:  53 PR Interval:  173 QRS Duration: 107 QT Interval:  468 QTC Calculation: 440 R Axis:   8 Text Interpretation: Sinus rhythm RSR' in V1 or V2, right VCD or RVH Confirmed by Gerlene Fee [phone]) on 10/12/2021 1:44:20 AM       Labs Reviewed  COMPREHENSIVE METABOLIC PANEL - Abnormal; Notable for the following components:      Result Value   Glucose, Bld 138 (*)    Total Protein 8.5 (*)    All other components within normal limits  CBC    CT HEAD WO CONTRAST (5MM)  Final Result      Medications  ondansetron (ZOFRAN-ODT) disintegrating tablet 4 mg (4 mg Oral Given 10/12/21 0011)  hydrALAZINE (APRESOLINE) tablet 50 mg (50 mg Oral Given 10/12/21 0010)     Procedures  /  Critical Care Procedures  ED Course and Medical Decision Making  Initial Impression and Ddx Significant hypertension with symptoms of lightheadedness and nausea today.  History of subarachnoid hemorrhage requiring IR intervention.  And so intracranial bleeding is considered, awaiting CT head.  Overall neurological exam is very reassuring, normal and symmetric strength and sensation, normal coordination, normal speech.  No headache.  Also considering renal impairment due to the high blood pressure, awaiting labs, CT, providing hydralazine and Zofran and will reassess.  Past medical/surgical history that increases complexity of ED encounter: Subarachnoid hemorrhage  Interpretation of Diagnostics I personally reviewed the  EKG and my interpretation is as follows: Sinus rhythm    Labs are reassuring with no significant blood count or electrolyte disturbance  Patient Reassessment and Ultimate Disposition/Management On reassessment patient is feeling a lot better, blood pressure is now 170/80, greatly improved.  CT head is without significant findings.  Continued reassuring neurological exam, no indication for further imaging or testing here in the emergency department, appropriate for discharge.  Patient management required discussion with the following services or consulting groups:  None  Complexity of Problems Addressed Acute illness or injury that poses threat of life of bodily function  Additional Data Reviewed and Analyzed Further history obtained from: Further history from spouse/family member  Additional Factors Impacting ED Encounter Risk Consideration of hospitalization  Barth Kirks. Sedonia Small, [place]  [email or web address]  Final Clinical Impressions(s) / ED Diagnoses     ICD-10-CM   1. Hypertension, unspecified type  I10       ED Discharge Orders     None        Discharge Instructions Discussed with and Provided to Patient:     Discharge Instructions      You were evaluated in the Emergency Department and after careful evaluation, we did not find any emergent condition requiring admission or further testing in the hospital.  Your exam/testing today was overall reassuring.  Recommend close follow-up with your primary care doctor to discuss your blood pressure.  Please return to the Emergency Department if you experience any worsening of your condition.  Thank you for allowing [place] to be a part of your care.        Maudie Flakes, MD 10/12/21 [id number]

## 2021-10-11 NOTE — ED Triage Notes (Signed)
Pt reports with HTN, dizziness, and nausea since earlier today. Pt states that she felt dizzy, checked her BP and it was high.

## 2021-10-12 ENCOUNTER — Other Ambulatory Visit: Payer: Self-pay | Admitting: Nurse Practitioner

## 2021-10-12 ENCOUNTER — Telehealth: Payer: Self-pay | Admitting: Nurse Practitioner

## 2021-10-12 LAB — COMPREHENSIVE METABOLIC PANEL
ALT: 20 U/L (ref 0–44)
AST: 25 U/L (ref 15–41)
Albumin: 3.9 g/dL (ref 3.5–5.0)
Alkaline Phosphatase: 91 U/L (ref 38–126)
Anion gap: 9 (ref 5–15)
BUN: 14 mg/dL (ref 6–20)
CO2: 28 mmol/L (ref 22–32)
Calcium: 9.8 mg/dL (ref 8.9–10.3)
Chloride: 102 mmol/L (ref 98–111)
Creatinine, Ser: 0.8 mg/dL (ref 0.44–1.00)
GFR, Estimated: >60 mL/min (ref >60)
Glucose, Bld: 138 mg/dL — ABNORMAL HIGH (ref 70–99)
Potassium: 3.6 mmol/L (ref 3.5–5.1)
Sodium: 139 mmol/L (ref 135–145)
Total Bilirubin: 0.9 mg/dL (ref 0.3–1.2)
Total Protein: 8.5 g/dL — ABNORMAL HIGH (ref 6.5–8.1)

## 2021-10-12 LAB — CBC
HCT: 41.8 % (ref 36.0–46.0)
Hemoglobin: 14 g/dL (ref 12.0–15.0)
MCH: 28.1 pg (ref 26.0–34.0)
MCHC: 33.5 g/dL (ref 30.0–36.0)
MCV: 83.8 fL (ref 80.0–100.0)
Platelets: 337 10*3/uL (ref 150–400)
RBC: 4.99 MIL/uL (ref 3.87–5.11)
RDW: 15.5 % (ref 11.5–15.5)
WBC: 9.2 10*3/uL (ref 4.0–10.5)
nRBC: 0 % (ref 0.0–0.2)

## 2021-10-12 MED ORDER — OLMESARTAN MEDOXOMIL 20 MG PO TABS: 20.0000 mg | ORAL_TABLET | Freq: Every day | ORAL | 2 refills | Status: DC

## 2021-10-12 NOTE — Telephone Encounter (Signed)
Received on call message about blood pressure, had been elevated yesterday with some dizziness and advised to go to ER. Was treated in ER with Hydralazine and CT scan head which was normal. Today her blood pressure is up again has taken metoprolol earlier. I have sent a Rx for olmesartan 20 mg to take daily. Will follow up on Tuesday, advised to return call if no improvement. Avoid eating foods high in salt and be sure to stay well hydrated with water.

## 2021-10-12 NOTE — Discharge Instructions (Signed)
You were evaluated in the Emergency Department and after careful evaluation, we did not find any emergent condition requiring admission or further testing in the hospital.  Your exam/testing today was overall reassuring.  Recommend close follow-up with your primary care doctor to discuss your blood pressure.  Please return to the Emergency Department if you experience any worsening of your condition.  Thank you for allowing [place] to be a part of your care.

## 2021-10-14 ENCOUNTER — Encounter: Payer: Self-pay | Admitting: Nurse Practitioner

## 2021-10-16 ENCOUNTER — Telehealth: Payer: BC Managed Care – PPO | Admitting: Physician Assistant

## 2021-10-16 DIAGNOSIS — I10 Essential (primary) hypertension: Secondary | ICD-10-CM | POA: Diagnosis not present

## 2021-10-16 NOTE — Patient Instructions (Signed)
Amanda Henry, thank you for joining [name], PA-C for today's virtual visit.  While this provider is not your primary care provider (PCP), if your PCP is located in our provider database this encounter information will be shared with them immediately following your visit.  Consent: (Patient) Amanda Henry provided verbal consent for this virtual visit at the beginning of the encounter.  Current Medications:  Current Outpatient Medications:    ALPRAZolam (XANAX XR) 1 MG 24 hr tablet, Take 1 mg by mouth daily as needed for sleep., Disp: , Rfl:    atorvastatin (LIPITOR) 20 MG tablet, Take 1 tablet (20 mg total) by mouth at bedtime. (Patient not taking: Reported on 10/11/2021), Disp: 30 tablet, Rfl: 2   escitalopram (LEXAPRO) 10 MG tablet, Take 1 tablet (10 mg total) by mouth daily. (Patient taking differently: Take 10 mg by mouth at bedtime.), Disp: 30 tablet, Rfl: 1   ipratropium (ATROVENT) 0.06 % nasal spray, Place 2 sprays into the nose 3 (three) times daily., Disp: 15 mL, Rfl: 2   levothyroxine (SYNTHROID) 150 MCG tablet, TAKE 1 TABLET(150 MCG) BY MOUTH DAILY BEFORE BREAKFAST, Disp: 45 tablet, Rfl: 1   meclizine (ANTIVERT) 12.5 MG tablet, Take 1 tablet (12.5 mg total) by mouth 3 (three) times daily as needed for dizziness. (Patient not taking: Reported on 10/11/2021), Disp: 30 tablet, Rfl: 0   metFORMIN (GLUCOPHAGE) 500 MG tablet, Take 1 tablet (500 mg total) by mouth 2 (two) times daily with a meal., Disp: 60 tablet, Rfl: 2   metoprolol tartrate (LOPRESSOR) 50 MG tablet, Take 1 tablet (50 mg total) by mouth daily., Disp: 90 tablet, Rfl: 1   olmesartan (BENICAR) 20 MG tablet, Take 1 tablet (20 mg total) by mouth daily., Disp: 30 tablet, Rfl: 2   Vitamin D, Ergocalciferol, (DRISDOL) 1.25 MG (50000 UNIT) CAPS capsule, Take 1 capsule (50,000 Units total) by mouth every 7 (seven) days., Disp: 12 capsule, Rfl: 1   Medications ordered in this encounter:  No orders of the defined  types were placed in this encounter.    *If you need refills on other medications prior to your next appointment, please contact your pharmacy*  Follow-Up: Call back or seek an in-person evaluation if the symptoms worsen or if the condition fails to improve as anticipated.  Other Instructions Keep well-hydrated and get plenty of rest.  Continue medications as directed with the following exception: take a whole tablet (20 mg) of the Olmesartan for tonight's dose.  Keep a low-salt diet -- see below.  Make sure you are trying to get 7-8 hours of sleep per night -- see sleep hygiene practices below to help with this. Consider downloading the Calm app on your phone to work through some mindfulness training to help with stress and anxiety.   Call your PCP office tomorrow morning to get scheduled for follow-up. Any chest pain, shortness of breath, dizziness or further increase in BP -- please be seen at nearest ER.   DASH Eating Plan DASH stands for Dietary Approaches to Stop Hypertension. The DASH eating plan is a healthy eating plan that has been shown to: Reduce high blood pressure (hypertension). Reduce your risk for type 2 diabetes, heart disease, and stroke. Help with weight loss. What are tips for following this plan? Reading food labels Check food labels for the amount of salt (sodium) per serving. Choose foods with less than 5 percent of the Daily Value of sodium. Generally, foods with less than 300 milligrams (mg) of sodium per  serving fit into this eating plan. To find whole grains, look for the word "whole" as the first word in the ingredient list. Shopping Buy products labeled as "low-sodium" or "no salt added." Buy fresh foods. Avoid canned foods and pre-made or frozen meals. Cooking Avoid adding salt when cooking. Use salt-free seasonings or herbs instead of table salt or sea salt. Check with your health care provider or pharmacist before using salt substitutes. Do not fry  foods. Cook foods using healthy methods such as baking, boiling, grilling, roasting, and broiling instead. Cook with heart-healthy oils, such as olive, canola, avocado, soybean, or sunflower oil. Meal planning  Eat a balanced diet that includes: 4 or more servings of fruits and 4 or more servings of vegetables each day. Try to fill one-half of your plate with fruits and vegetables. 6-8 servings of whole grains each day. Less than 6 oz (170 g) of lean meat, poultry, or fish each day. A 3-oz (85-g) serving of meat is about the same size as a deck of cards. One egg equals 1 oz (28 g). 2-3 servings of low-fat dairy each day. One serving is 1 cup (237 mL). 1 serving of nuts, seeds, or beans 5 times each week. 2-3 servings of heart-healthy fats. Healthy fats called omega-3 fatty acids are found in foods such as walnuts, flaxseeds, fortified milks, and eggs. These fats are also found in cold-water fish, such as sardines, salmon, and mackerel. Limit how much you eat of: Canned or prepackaged foods. Food that is high in trans fat, such as some fried foods. Food that is high in saturated fat, such as fatty meat. Desserts and other sweets, sugary drinks, and other foods with added sugar. Full-fat dairy products. Do not salt foods before eating. Do not eat more than 4 egg yolks a week. Try to eat at least 2 vegetarian meals a week. Eat more home-cooked food and less restaurant, buffet, and fast food. Lifestyle When eating at a restaurant, ask that your food be prepared with less salt or no salt, if possible. If you drink alcohol: Limit how much you use to: 0-1 drink a day for women who are not pregnant. 0-2 drinks a day for men. Be aware of how much alcohol is in your drink. In the [place] one drink equals one 12 oz bottle of beer (355 mL), one 5 oz glass of wine (148 mL), or one 1 oz glass of hard liquor (44 mL). General information Avoid eating more than 2,300 mg of salt a day. If you have  hypertension, you may need to reduce your sodium intake to 1,500 mg a day. Work with your health care provider to maintain a healthy body weight or to lose weight. Ask what an ideal weight is for you. Get at least 30 minutes of exercise that causes your heart to beat faster (aerobic exercise) most days of the week. Activities may include walking, swimming, or biking. Work with your health care provider or dietitian to adjust your eating plan to your individual calorie needs. What foods should I eat? Fruits All fresh, dried, or frozen fruit. Canned fruit in natural juice (without added sugar). Vegetables Fresh or frozen vegetables (raw, steamed, roasted, or grilled). Low-sodium or reduced-sodium tomato and vegetable juice. Low-sodium or reduced-sodium tomato sauce and tomato paste. Low-sodium or reduced-sodium canned vegetables. Grains Whole-grain or whole-wheat bread. Whole-grain or whole-wheat pasta. Brown rice. [place]. Bulgur. Whole-grain and low-sodium cereals. Pita bread. Low-fat, low-sodium crackers. Whole-wheat flour tortillas. Meats and other proteins  Skinless chicken or [place]. Ground chicken or [place]. Pork with fat trimmed off. Fish and seafood. Egg whites. Dried beans, peas, or lentils. Unsalted nuts, nut butters, and seeds. Unsalted canned beans. Lean cuts of beef with fat trimmed off. Low-sodium, lean precooked or cured meat, such as sausages or meat loaves. Dairy Low-fat (1%) or fat-free (skim) milk. Reduced-fat, low-fat, or fat-free cheeses. Nonfat, low-sodium ricotta or cottage cheese. Low-fat or nonfat yogurt. Low-fat, low-sodium cheese. Fats and oils Soft margarine without trans fats. Vegetable oil. Reduced-fat, low-fat, or light mayonnaise and salad dressings (reduced-sodium). Canola, safflower, olive, avocado, soybean, and sunflower oils. Avocado. Seasonings and condiments Herbs. Spices. Seasoning mixes without salt. Other foods Unsalted popcorn and pretzels. Fat-free  sweets. The items listed above may not be a complete list of foods and beverages you can eat. Contact a dietitian for more information. What foods should I avoid? Fruits Canned fruit in a light or heavy syrup. Fried fruit. Fruit in cream or butter sauce. Vegetables Creamed or fried vegetables. Vegetables in a cheese sauce. Regular canned vegetables (not low-sodium or reduced-sodium). Regular canned tomato sauce and paste (not low-sodium or reduced-sodium). Regular tomato and vegetable juice (not low-sodium or reduced-sodium). Angie Fava. Olives. Grains Baked goods made with fat, such as croissants, muffins, or some breads. Dry pasta or rice meal packs. Meats and other proteins Fatty cuts of meat. Ribs. Fried meat. Berniece Salines. Bologna, salami, and other precooked or cured meats, such as sausages or meat loaves. Fat from the back of a pig (fatback). Bratwurst. Salted nuts and seeds. Canned beans with added salt. Canned or smoked fish. Whole eggs or egg yolks. Chicken or [place] with skin. Dairy Whole or 2% milk, cream, and half-and-half. Whole or full-fat cream cheese. Whole-fat or sweetened yogurt. Full-fat cheese. Nondairy creamers. Whipped toppings. Processed cheese and cheese spreads. Fats and oils Butter. Stick margarine. Lard. Shortening. Ghee. Bacon fat. Tropical oils, such as coconut, palm kernel, or palm oil. Seasonings and condiments Onion salt, garlic salt, seasoned salt, table salt, and sea salt. Worcestershire sauce. Tartar sauce. Barbecue sauce. Teriyaki sauce. Soy sauce, including reduced-sodium. Steak sauce. Canned and packaged gravies. Fish sauce. Oyster sauce. Cocktail sauce. Store-bought horseradish. Ketchup. Mustard. Meat flavorings and tenderizers. Bouillon cubes. Hot sauces. Pre-made or packaged marinades. Pre-made or packaged taco seasonings. Relishes. Regular salad dressings. Other foods Salted popcorn and pretzels. The items listed above may not be a complete list of foods and  beverages you should avoid. Contact a dietitian for more information. Where to find more information National Heart, Lung, and Blood Institute: https://wilson-eaton.com/ American Heart Association: www.heart.org Academy of Nutrition and Dietetics: www.eatright.[place]: www.kidney.org Summary The DASH eating plan is a healthy eating plan that has been shown to reduce high blood pressure (hypertension). It may also reduce your risk for type 2 diabetes, heart disease, and stroke. When on the DASH eating plan, aim to eat more fresh fruits and vegetables, whole grains, lean proteins, low-fat dairy, and heart-healthy fats. With the DASH eating plan, you should limit salt (sodium) intake to 2,300 mg a day. If you have hypertension, you may need to reduce your sodium intake to 1,500 mg a day. Work with your health care provider or dietitian to adjust your eating plan to your individual calorie needs. This information is not intended to replace advice given to you by your health care provider. Make sure you discuss any questions you have with your health care provider. Document Revised: 04/08/2019 Document Reviewed: 04/08/2019 Elsevier Patient Education  2023 Elsevier  Inc.  Sleep Hygiene  Do: (1) Go to bed at the same time each day. (2) Get up from bed at the same time each day. (3) Get regular exercise each day, preferably in the morning.  There is goof evidence that regular exercise improves restful sleep.  This includes stretching and aerobic exercise. (4) Get regular exposure to outdoor or bright lights, especially in the late afternoon. (5) Keep the temperature in your bedroom comfortable. (6) Keep the bedroom quiet when sleeping. (7) Keep the bedroom dark enough to facilitate sleep. (8) Use your bed only for sleep and sex. (9) Take medications as directed.  It is helpful to take prescribed sleeping pills 1 hour before bedtime, so they are causing drowsiness when you lie down,  or 10 hours before getting up, to avoid daytime drowsiness. (10) Use a relaxation exercise just before going to sleep -- imagery, massage, warm bath. (11) Keep your feet and hands warm.  Wear warm socks and/or mittens or gloves to bed.  Don't: (1) Exercise just before going to bed. (2) Engage in stimulating activity just before bed, such as playing a competitive game, watching an exciting program on television, or having an important discussion with a loved one. (3) Have caffeine in the evening (coffee, teas, chocolate, sodas, etc.) (4) Read or watch television in bed. (5) Use alcohol to help you sleep. (6) Go to bed too hungry or too full. (7) Take another person's sleeping pills. (8) Take over-the-counter sleeping pills, without your doctor's knowledge.  Tolerance can develop rapidly with these medications.  Diphenhydramine can have serious side effects for elderly patients. (9) Take daytime naps. (10) Command yourself to go to sleep.  This only makes your mind and body more alert.  If you lie awake for more than 20-30 minutes, get up, go to a different room, participate in a quiet activity (Ex - non-excitable reading or television), and then return to bed when you feel sleepy.  Do this as many times during the night as needed.  This may cause you to have a night or two of poor sleep but it will train your brain to know when it is time for sleep.   If you have been instructed to have an in-person evaluation today at a local Urgent Care facility, please use the link below. It will take you to a list of all of our available [facility], including address, phone number and hours of operation. Please do not delay care.  [facility]  If you or a family member do not have a primary care provider, use the link below to schedule a visit and establish care. When you choose a [facility] primary care physician or advanced practice provider, you gain a long-term partner in  health. Find a Primary Care Provider  Learn more about [facility]'s in-office and virtual care options: [facility]

## 2021-10-16 NOTE — Progress Notes (Signed)
Virtual Visit Consent   Amanda Henry, you are scheduled for a virtual visit with a [facility] provider today. Just as with appointments in the office, your consent must be obtained to participate. Your consent will be active for this visit and any virtual visit you may have with one of our providers in the next 365 days. If you have a MyChart account, a copy of this consent can be sent to you electronically.  As this is a virtual visit, video technology does not allow for your provider to perform a traditional examination. This may limit your provider's ability to fully assess your condition. If your provider identifies any concerns that need to be evaluated in person or the need to arrange testing (such as labs, EKG, etc.), we will make arrangements to do so. Although advances in technology are sophisticated, we cannot ensure that it will always work on either your end or our end. If the connection with a video visit is poor, the visit may have to be switched to a telephone visit. With either a video or telephone visit, we are not always able to ensure that we have a secure connection.  By engaging in this virtual visit, you consent to the provision of healthcare and authorize for your insurance to be billed (if applicable) for the services provided during this visit. Depending on your insurance coverage, you may receive a charge related to this service.  I need to obtain your verbal consent now. Are you willing to proceed with your visit today? Amanda Henry has provided verbal consent on 10/16/2021 for a virtual visit (video or telephone). Amanda Henry, [place]  Date: 10/16/2021 7:49 PM  Virtual Visit via Video Note   I, Amanda Henry, connected with  Amanda Henry  ([id number], Jan 12, 1966) on 10/16/21 at  7:45 PM EDT by a video-enabled telemedicine application and verified that I am speaking with the correct person using two identifiers.  Location: Patient: Virtual Visit  Location Patient: Home Provider: Virtual Visit Location Provider: Home Office   I discussed the limitations of evaluation and management by telemedicine and the availability of in person appointments. The patient expressed understanding and agreed to proceed.    History of Present Illness: Amanda Henry is a 56 y.o. who identifies as a female who was assigned female at birth, and is being seen today for question about her BP. Has history of hypertension, recently with ER evaluation due to elevated BP and headache. Giving her history of SAH, CT performed and negsative. Systolic BP initially > 774 but cam down to 170 in the ER. Was discharged home with addition of Olmesartan 10 mg to her regimen of Metoprolol. Taking as directed and checking BP. This evening notes higher at 171/111. Denies headache, dizziness, palpitations, SOB or chest pain. Notes some flushing but still dealing with stress and worry over her BP. Notes taking all medications as directed and tolerating well so far.   HPI: HPI  Problems:  Patient Active Problem List   Diagnosis Date Noted   Hypercalcemia 04/03/2021   Hypokalemia 04/03/2021   History of subarachnoid hemorrhage 04/03/2021   Hyperparathyroidism ([place]) 04/03/2021   SAH (subarachnoid hemorrhage) ([place]) 03/15/2021   Mixed hyperlipidemia 06/03/2018   Prediabetes 06/03/2018   S/P laparoscopic assisted vaginal hysterectomy (LAVH) 10/01/2016   Menorrhagia 09/10/2016   Iron deficiency anemia 05/06/2016   Acquired hypothyroidism 05/24/2013   Depression with anxiety 05/24/2013    Allergies:  Allergies  Allergen Reactions   Latex Rash   Penicillins  Rash    Has patient had a PCN reaction causing immediate rash, facial/tongue/throat swelling, SOB or lightheadedness with hypotension: Yes Has patient had a PCN reaction causing severe rash involving mucus membranes or skin necrosis: No Has patient had a PCN reaction that required hospitalization No Has patient had a PCN  reaction occurring within the last 10 years: No If all of the above answers are "NO", then may proceed with Cephalosporin use.    Medications:  Current Outpatient Medications:    ALPRAZolam (XANAX XR) 1 MG 24 hr tablet, Take 1 mg by mouth daily as needed for sleep., Disp: , Rfl:    atorvastatin (LIPITOR) 20 MG tablet, Take 1 tablet (20 mg total) by mouth at bedtime. (Patient not taking: Reported on 10/11/2021), Disp: 30 tablet, Rfl: 2   escitalopram (LEXAPRO) 10 MG tablet, Take 1 tablet (10 mg total) by mouth daily. (Patient taking differently: Take 10 mg by mouth at bedtime.), Disp: 30 tablet, Rfl: 1   ipratropium (ATROVENT) 0.06 % nasal spray, Place 2 sprays into the nose 3 (three) times daily., Disp: 15 mL, Rfl: 2   levothyroxine (SYNTHROID) 150 MCG tablet, TAKE 1 TABLET(150 MCG) BY MOUTH DAILY BEFORE BREAKFAST, Disp: 45 tablet, Rfl: 1   meclizine (ANTIVERT) 12.5 MG tablet, Take 1 tablet (12.5 mg total) by mouth 3 (three) times daily as needed for dizziness. (Patient not taking: Reported on 10/11/2021), Disp: 30 tablet, Rfl: 0   metFORMIN (GLUCOPHAGE) 500 MG tablet, Take 1 tablet (500 mg total) by mouth 2 (two) times daily with a meal., Disp: 60 tablet, Rfl: 2   metoprolol tartrate (LOPRESSOR) 50 MG tablet, Take 1 tablet (50 mg total) by mouth daily., Disp: 90 tablet, Rfl: 1   olmesartan (BENICAR) 20 MG tablet, Take 1 tablet (20 mg total) by mouth daily., Disp: 30 tablet, Rfl: 2   Vitamin D, Ergocalciferol, (DRISDOL) 1.25 MG (50000 UNIT) CAPS capsule, Take 1 capsule (50,000 Units total) by mouth every 7 (seven) days., Disp: 12 capsule, Rfl: 1  Observations/Objective: Patient is well-developed, well-nourished in no acute distress.  Resting comfortably at home.  Head is normocephalic, atraumatic.  No labored breathing. Speech is clear and coherent with logical content.  Patient is alert and oriented at baseline.   Assessment and Plan: 1. Uncontrolled hypertension  No alarm signs or  symptoms. Will have her increase Olmesartan to 20 mg for tonight's dose. Continue other medications as directed. Recheck BP prior to bed and in the morning and record. Follow-up with PCP first thing tomorrow morning for further assessment. Strict ER precautions reviewed.   Follow Up Instructions: I discussed the assessment and treatment plan with the patient. The patient was provided an opportunity to ask questions and all were answered. The patient agreed with the plan and demonstrated an understanding of the instructions.  A copy of instructions were sent to the patient via MyChart unless otherwise noted below.   The patient was advised to call back or seek an in-person evaluation if the symptoms worsen or if the condition fails to improve as anticipated.  Time:  I spent 15 minutes with the patient via telehealth technology discussing the above problems/concerns.    Amanda Rio, PA-C

## 2021-10-17 ENCOUNTER — Other Ambulatory Visit: Payer: Self-pay | Admitting: Neurosurgery

## 2021-10-17 ENCOUNTER — Encounter: Payer: Self-pay | Admitting: Nurse Practitioner

## 2021-10-17 ENCOUNTER — Ambulatory Visit: Payer: BC Managed Care – PPO | Admitting: Nurse Practitioner

## 2021-10-17 VITALS — BP 162/98 | HR 71 | Temp 97.9°F | Ht 67.0 in | Wt 204.0 lb

## 2021-10-17 DIAGNOSIS — F419 Anxiety disorder, unspecified: Secondary | ICD-10-CM

## 2021-10-17 DIAGNOSIS — E782 Mixed hyperlipidemia: Secondary | ICD-10-CM | POA: Diagnosis not present

## 2021-10-17 DIAGNOSIS — R7303 Prediabetes: Secondary | ICD-10-CM

## 2021-10-17 DIAGNOSIS — I10 Essential (primary) hypertension: Secondary | ICD-10-CM

## 2021-10-17 DIAGNOSIS — G4709 Other insomnia: Secondary | ICD-10-CM

## 2021-10-17 DIAGNOSIS — E039 Hypothyroidism, unspecified: Secondary | ICD-10-CM | POA: Diagnosis not present

## 2021-10-17 DIAGNOSIS — Z6831 Body mass index (BMI) 31.0-31.9, adult: Secondary | ICD-10-CM

## 2021-10-17 DIAGNOSIS — E6609 Other obesity due to excess calories: Secondary | ICD-10-CM

## 2021-10-17 MED ORDER — MAGNESIUM 250 MG PO TABS: 1.0000 | ORAL_TABLET | Freq: Every day | ORAL | 2 refills | Status: DC

## 2021-10-17 MED ORDER — HYDROCHLOROTHIAZIDE 12.5 MG PO TABS: 12.5000 mg | ORAL_TABLET | Freq: Every day | ORAL | 3 refills | Status: DC

## 2021-10-17 NOTE — Progress Notes (Signed)
I,Amanda Henry,acting as a Education administrator for [employer], FNP.,have documented all relevant documentation on the behalf of Amanda Brine, FNP,as directed by  Amanda Brine, FNP while in the presence of Amanda Henry, Amanda Henry.  This visit occurred during the SARS-CoV-2 public health emergency.  Safety protocols were in place, including screening questions prior to the visit, additional usage of staff PPE, and extensive cleaning of exam room while observing appropriate contact time as indicated for disinfecting solutions.  Subjective:     Patient ID: Amanda Henry , female    DOB: 1965-07-08 , 56 y.o.   MRN: [id number]   Chief Complaint  Patient presents with   Follow-up    HPI  Patient presents today for follow up for elevated blood pressure. She had been taking 1/2 tablet olmesartan since Sunday for 2 days. While out on Tuesday got hot and her blood pressure went up. Felt better after eating. She took a 1/2 pill that day as well. She took metoprolol in am then olmesartan in the evening. She had an e-visit yesterday wanted to make sure of medications and if on medication was good. He recommended she take the whole pill and relax. And was advised to not eat anything the night of. She has gained approximately 20 lbs since August 2022.  She continues to be out of work.   Wt Readings from Last 3 Encounters: 10/17/21 : 204 lb (92.5 kg) 10/11/21 : 200 lb (90.7 kg) 08/26/21 : 208 lb (94.3 kg)    Hypertension This is a chronic problem. Pertinent negatives include no anxiety. There are no associated agents to hypertension. Risk factors for coronary artery disease include obesity and sedentary lifestyle. Compliance problems include exercise.  There is no history of angina. There is no history of chronic renal disease.     Past Medical History:  Diagnosis Date   Anxiety    Blood transfusion without reported diagnosis    Depression    Hypertension    no medications at this time   Hypothyroidism     Iron deficiency anemia    Thyroid disease      Family History  Problem Relation Age of Onset   Thyroid disease Mother    Hypertension Mother    Diabetes Father    Hypertension Other    Diabetes Other    Thyroid disease Other    Heart disease Other      Current Outpatient Medications:    escitalopram (LEXAPRO) 10 MG tablet, Take 1 tablet (10 mg total) by mouth daily. (Patient taking differently: Take 10 mg by mouth at bedtime.), Disp: 30 tablet, Rfl: 1   hydrochlorothiazide (HYDRODIURIL) 12.5 MG tablet, Take 1 tablet (12.5 mg total) by mouth daily., Disp: 30 tablet, Rfl: 3   ipratropium (ATROVENT) 0.06 % nasal spray, Place 2 sprays into the nose 3 (three) times daily., Disp: 15 mL, Rfl: 2   Magnesium 250 MG TABS, Take 1 tablet (250 mg total) by mouth daily. Take with evening meal, Disp: 30 tablet, Rfl: 2   meclizine (ANTIVERT) 12.5 MG tablet, Take 1 tablet (12.5 mg total) by mouth 3 (three) times daily as needed for dizziness., Disp: 30 tablet, Rfl: 0   metFORMIN (GLUCOPHAGE) 500 MG tablet, Take 1 tablet (500 mg total) by mouth 2 (two) times daily with a meal., Disp: 60 tablet, Rfl: 2   metoprolol tartrate (LOPRESSOR) 50 MG tablet, Take 1 tablet (50 mg total) by mouth daily., Disp: 90 tablet, Rfl: 1   olmesartan (BENICAR) 20 MG tablet, Take  1 tablet (20 mg total) by mouth daily., Disp: 30 tablet, Rfl: 2   Vitamin D, Ergocalciferol, (DRISDOL) 1.25 MG (50000 UNIT) CAPS capsule, Take 1 capsule (50,000 Units total) by mouth every 7 (seven) days., Disp: 12 capsule, Rfl: 1   ALPRAZolam (XANAX XR) 1 MG 24 hr tablet, Take 1 mg by mouth daily as needed for sleep., Disp: , Rfl:    levothyroxine (SYNTHROID) 150 MCG tablet, TAKE 1 TABLET(150 MCG) BY MOUTH DAILY BEFORE BREAKFAST, Disp: 45 tablet, Rfl: 1   Allergies  Allergen Reactions   Latex Rash   Penicillins Rash    Has patient had a PCN reaction causing immediate rash, facial/tongue/throat swelling, SOB or lightheadedness with hypotension:  Yes Has patient had a PCN reaction causing severe rash involving mucus membranes or skin necrosis: No Has patient had a PCN reaction that required hospitalization No Has patient had a PCN reaction occurring within the last 10 years: No If all of the above answers are "NO", then may proceed with Cephalosporin use.      Review of Systems  Constitutional: Negative.   Respiratory: Negative.    Cardiovascular: Negative.   Gastrointestinal: Negative.   Neurological: Negative.      Today's Vitals   10/17/21 0918  BP: (!) 162/98  Pulse: 71  Temp: 97.9 F (36.6 C)  TempSrc: Oral  Weight: 204 lb (92.5 kg)  Height: '5\' 7"'$  (1.702 m)   Body mass index is 31.95 kg/m.  Wt Readings from Last 3 Encounters:  10/29/21 200 lb (90.7 kg)  10/17/21 204 lb (92.5 kg)  10/11/21 200 lb (90.7 kg)    Objective:  Physical Exam Vitals reviewed.  Constitutional:      General: She is not in acute distress.    Appearance: Normal appearance. She is obese.  Cardiovascular:     Rate and Rhythm: Normal rate and regular rhythm.     Pulses: Normal pulses.     Heart sounds: Normal heart sounds. No murmur heard. Pulmonary:     Effort: Pulmonary effort is normal. No respiratory distress.     Breath sounds: Normal breath sounds. No wheezing.  Skin:    General: Skin is warm and dry.     Capillary Refill: Capillary refill takes less than 2 seconds.  Neurological:     General: No focal deficit present.     Mental Status: She is alert and oriented to person, place, and time.     Cranial Nerves: No cranial nerve deficit.     Motor: No weakness.  Psychiatric:        Mood and Affect: Mood normal.        Behavior: Behavior normal.        Thought Content: Thought content normal.        Judgment: Judgment normal.         Assessment And Plan:     1. Essential hypertension Comments: Improved but continues to be elevated. She is to take the whole tablet of the olmesartan - hydrochlorothiazide (HYDRODIURIL)  12.5 MG tablet; Take 1 tablet (12.5 mg total) by mouth daily.  Dispense: 30 tablet; Refill: 3  2. Mixed hyperlipidemia Comments: Stable,   3. Prediabetes Comments: Elevated at last visit discussed the importance of increasing physical activity and focus on healthy diet  4. Acquired hypothyroidism Comments: Continue follow up with Endocrinology  5. Other insomnia Comments: She is encouraged to take her antianxiety medication to help her to sleep, this may be the cause of her elevated blood pressure  6. Anxiety Comments: She is to call the psychiatrist to get set back up with counseling.  7. Class 1 obesity due to excess calories with serious comorbidity and body mass index (BMI) of 31.0 to 31.9 in adult  She is encouraged to strive for BMI less than 30 to decrease cardiac risk. Advised to aim for at least 150 minutes of exercise per week.    Patient was given opportunity to ask questions. Patient verbalized understanding of the plan and was able to repeat key elements of the plan. All questions were answered to their satisfaction.  Amanda Brine, FNP   I, Amanda Brine, FNP, have reviewed all documentation for this visit. The documentation on 10/17/21 for the exam, diagnosis, procedures, and orders are all accurate and complete.   IF YOU HAVE BEEN REFERRED TO A SPECIALIST, IT MAY TAKE 1-2 WEEKS TO SCHEDULE/PROCESS THE REFERRAL. IF YOU HAVE NOT HEARD FROM US/SPECIALIST IN TWO WEEKS, PLEASE GIVE [place] A CALL AT [phone].   THE PATIENT IS ENCOURAGED TO PRACTICE SOCIAL DISTANCING DUE TO THE COVID-19 PANDEMIC.

## 2021-10-17 NOTE — Patient Instructions (Addendum)
Hypertension, Adult High blood pressure (hypertension) is when the force of blood pumping through the arteries is too strong. The arteries are the blood vessels that carry blood from the heart throughout the body. Hypertension forces the heart to work harder to pump blood and may cause arteries to become narrow or stiff. Untreated or uncontrolled hypertension can lead to a heart attack, heart failure, a stroke, kidney disease, and other problems. A blood pressure reading consists of a higher number over a lower number. Ideally, your blood pressure should be below 120/80. The first ("top") number is called the systolic pressure. It is a measure of the pressure in your arteries as your heart beats. The second ("bottom") number is called the diastolic pressure. It is a measure of the pressure in your arteries as the heart relaxes. What are the causes? The exact cause of this condition is not known. There are some conditions that result in high blood pressure. What increases the risk? Certain factors may make you more likely to develop high blood pressure. Some of these risk factors are under your control, including: Smoking. Not getting enough exercise or physical activity. Being overweight. Having too much fat, sugar, calories, or salt (sodium) in your diet. Drinking too much alcohol. Other risk factors include: Having a personal history of heart disease, diabetes, high cholesterol, or kidney disease. Stress. Having a family history of high blood pressure and high cholesterol. Having obstructive sleep apnea. Age. The risk increases with age. What are the signs or symptoms? High blood pressure may not cause symptoms. Very high blood pressure (hypertensive crisis) may cause: Headache. Fast or irregular heartbeats (palpitations). Shortness of breath. Nosebleed. Nausea and vomiting. Vision changes. Severe chest pain, dizziness, and seizures. How is this diagnosed? This condition is diagnosed by  measuring your blood pressure while you are seated, with your arm resting on a flat surface, your legs uncrossed, and your feet flat on the floor. The cuff of the blood pressure monitor will be placed directly against the skin of your upper arm at the level of your heart. Blood pressure should be measured at least twice using the same arm. Certain conditions can cause a difference in blood pressure between your right and left arms. If you have a high blood pressure reading during one visit or you have normal blood pressure with other risk factors, you may be asked to: Return on a different day to have your blood pressure checked again. Monitor your blood pressure at home for 1 week or longer. If you are diagnosed with hypertension, you may have other blood or imaging tests to help your health care provider understand your overall risk for other conditions. How is this treated? This condition is treated by making healthy lifestyle changes, such as eating healthy foods, exercising more, and reducing your alcohol intake. You may be referred for counseling on a healthy diet and physical activity. Your health care provider may prescribe medicine if lifestyle changes are not enough to get your blood pressure under control and if: Your systolic blood pressure is above 130. Your diastolic blood pressure is above 80. Your personal target blood pressure may vary depending on your medical conditions, your age, and other factors. Follow these instructions at home: Eating and drinking  Eat a diet that is high in fiber and potassium, and low in sodium, added sugar, and fat. An example of this eating plan is called the DASH diet. DASH stands for Dietary Approaches to Stop Hypertension. To eat this way: Eat   plenty of fresh fruits and vegetables. Try to fill one half of your plate at each meal with fruits and vegetables. Eat whole grains, such as whole-wheat pasta, brown rice, or whole-grain bread. Fill about one  fourth of your plate with whole grains. Eat or drink low-fat dairy products, such as skim milk or low-fat yogurt. Avoid fatty cuts of meat, processed or cured meats, and poultry with skin. Fill about one fourth of your plate with lean proteins, such as fish, chicken without skin, beans, eggs, or tofu. Avoid pre-made and processed foods. These tend to be higher in sodium, added sugar, and fat. Reduce your daily sodium intake. Many people with hypertension should eat less than 1,500 mg of sodium a day. Do not drink alcohol if: Your health care provider tells you not to drink. You are pregnant, may be pregnant, or are planning to become pregnant. If you drink alcohol: Limit how much you have to: 0-1 drink a day for women. 0-2 drinks a day for men. Know how much alcohol is in your drink. In the [place] one drink equals one 12 oz bottle of beer (355 mL), one 5 oz glass of wine (148 mL), or one 1 oz glass of hard liquor (44 mL). Lifestyle  Work with your health care provider to maintain a healthy body weight or to lose weight. Ask what an ideal weight is for you. Get at least 30 minutes of exercise that causes your heart to beat faster (aerobic exercise) most days of the week. Activities may include walking, swimming, or biking. Include exercise to strengthen your muscles (resistance exercise), such as Pilates or lifting weights, as part of your weekly exercise routine. Try to do these types of exercises for 30 minutes at least 3 days a week. Do not use any products that contain nicotine or tobacco. These products include cigarettes, chewing tobacco, and vaping devices, such as e-cigarettes. If you need help quitting, ask your health care provider. Monitor your blood pressure at home as told by your health care provider. Keep all follow-up visits. This is important. Medicines Take over-the-counter and prescription medicines only as told by your health care provider. Follow directions carefully. Blood  pressure medicines must be taken as prescribed. Do not skip doses of blood pressure medicine. Doing this puts you at risk for problems and can make the medicine less effective. Ask your health care provider about side effects or reactions to medicines that you should watch for. Contact a health care provider if you: Think you are having a reaction to a medicine you are taking. Have headaches that keep coming back (recurring). Feel dizzy. Have swelling in your ankles. Have trouble with your vision. Get help right away if you: Develop a severe headache or confusion. Have unusual weakness or numbness. Feel faint. Have severe pain in your chest or abdomen. Vomit repeatedly. Have trouble breathing. These symptoms may be an emergency. Get help right away. Call 911. Do not wait to see if the symptoms will go away. Do not drive yourself to the hospital. Summary Hypertension is when the force of blood pumping through your arteries is too strong. If this condition is not controlled, it may put you at risk for serious complications. Your personal target blood pressure may vary depending on your medical conditions, your age, and other factors. For most people, a normal blood pressure is less than 120/80. Hypertension is treated with lifestyle changes, medicines, or a combination of both. Lifestyle changes include losing weight, eating a healthy,   low-sodium diet, exercising more, and limiting alcohol. This information is not intended to replace advice given to you by your health care provider. Make sure you discuss any questions you have with your health care provider. Document Revised: 03/12/2021 Document Reviewed: 03/12/2021 Elsevier Patient Education  [place] [employer] weekly injection and Rybelsus daily tablet for diabetes with a side effect of weight loss. These are to treat diabetes  Do not take HCTZ yet, call us on Monday with your readings. Take a whole tablet of your  olmesartan.

## 2021-10-19 ENCOUNTER — Other Ambulatory Visit: Payer: Self-pay | Admitting: Nurse Practitioner

## 2021-10-19 DIAGNOSIS — Z8679 Personal history of other diseases of the circulatory system: Secondary | ICD-10-CM

## 2021-10-22 ENCOUNTER — Other Ambulatory Visit: Payer: Self-pay

## 2021-10-22 ENCOUNTER — Encounter: Payer: Self-pay | Admitting: Nurse Practitioner

## 2021-10-22 DIAGNOSIS — E039 Hypothyroidism, unspecified: Secondary | ICD-10-CM

## 2021-10-22 MED ORDER — LEVOTHYROXINE SODIUM 150 MCG PO TABS: ORAL_TABLET | ORAL | 1 refills | Status: DC

## 2021-10-25 ENCOUNTER — Other Ambulatory Visit: Payer: Self-pay

## 2021-10-25 DIAGNOSIS — E039 Hypothyroidism, unspecified: Secondary | ICD-10-CM

## 2021-10-25 MED ORDER — LEVOTHYROXINE SODIUM 150 MCG PO TABS: ORAL_TABLET | ORAL | 1 refills | Status: DC

## 2021-10-29 ENCOUNTER — Ambulatory Visit (HOSPITAL_COMMUNITY)
Admission: RE | Admit: 2021-10-29 | Discharge: 2021-10-29 | Disposition: A | Discharge status: Home / Self Care | Payer: BC Managed Care – PPO | Source: Ambulatory Visit | Attending: Neurosurgery | Admitting: Neurosurgery

## 2021-10-29 ENCOUNTER — Other Ambulatory Visit: Payer: Self-pay | Admitting: Neurosurgery

## 2021-10-29 ENCOUNTER — Other Ambulatory Visit: Payer: Self-pay

## 2021-10-29 DIAGNOSIS — I720 Aneurysm of carotid artery: Secondary | ICD-10-CM | POA: Diagnosis present

## 2021-10-29 DIAGNOSIS — I609 Nontraumatic subarachnoid hemorrhage, unspecified: Secondary | ICD-10-CM

## 2021-10-29 DIAGNOSIS — Z8679 Personal history of other diseases of the circulatory system: Secondary | ICD-10-CM | POA: Diagnosis not present

## 2021-10-29 HISTORY — PX: IR ANGIO INTRA EXTRACRAN SEL INTERNAL CAROTID UNI R MOD SED: IMG5362

## 2021-10-29 HISTORY — PX: IR US GUIDE VASC ACCESS RIGHT: IMG2390

## 2021-10-29 LAB — CBC WITH DIFFERENTIAL/PLATELET
Abs Immature Granulocytes: 0.02 10*3/uL (ref 0.00–0.07)
Basophils Absolute: 0.1 10*3/uL (ref 0.0–0.1)
Basophils Relative: 1 %
Eosinophils Absolute: 0.2 10*3/uL (ref 0.0–0.5)
Eosinophils Relative: 3 %
HCT: 41.6 % (ref 36.0–46.0)
Hemoglobin: 13.3 g/dL (ref 12.0–15.0)
Immature Granulocytes: 0 %
Lymphocytes Relative: 46 %
Lymphs Abs: 3.7 10*3/uL (ref 0.7–4.0)
MCH: 27.4 pg (ref 26.0–34.0)
MCHC: 32 g/dL (ref 30.0–36.0)
MCV: 85.8 fL (ref 80.0–100.0)
Monocytes Absolute: 0.4 10*3/uL (ref 0.1–1.0)
Monocytes Relative: 5 %
Neutro Abs: 3.6 10*3/uL (ref 1.7–7.7)
Neutrophils Relative %: 45 %
Platelets: 330 10*3/uL (ref 150–400)
RBC: 4.85 MIL/uL (ref 3.87–5.11)
RDW: 15.5 % (ref 11.5–15.5)
WBC: 8 10*3/uL (ref 4.0–10.5)
nRBC: 0 % (ref 0.0–0.2)

## 2021-10-29 LAB — BASIC METABOLIC PANEL
Anion gap: 7 (ref 5–15)
BUN: 8 mg/dL (ref 6–20)
CO2: 25 mmol/L (ref 22–32)
Calcium: 8.7 mg/dL — ABNORMAL LOW (ref 8.9–10.3)
Chloride: 104 mmol/L (ref 98–111)
Creatinine, Ser: 0.79 mg/dL (ref 0.44–1.00)
GFR, Estimated: >60 mL/min (ref >60)
Glucose, Bld: 124 mg/dL — ABNORMAL HIGH (ref 70–99)
Potassium: 3.4 mmol/L — ABNORMAL LOW (ref 3.5–5.1)
Sodium: 136 mmol/L (ref 135–145)

## 2021-10-29 LAB — PROTIME-INR
INR: 1.3 — ABNORMAL HIGH (ref 0.8–1.2)
Prothrombin Time: 15.7 seconds — ABNORMAL HIGH (ref 11.4–15.2)

## 2021-10-29 MED ORDER — FENTANYL CITRATE (PF) 100 MCG/2ML IJ SOLN
INTRAMUSCULAR | Status: AC
  Filled 2021-10-29: qty 2

## 2021-10-29 MED ORDER — ATROPINE SULFATE 1 MG/ML IV SOLN
INTRAVENOUS | Status: AC
  Filled 2021-10-29: qty 1

## 2021-10-29 MED ORDER — LIDOCAINE HCL 1 % IJ SOLN
INTRAMUSCULAR | Status: AC
  Filled 2021-10-29: qty 20

## 2021-10-29 MED ORDER — SODIUM CHLORIDE 0.9 % IV SOLN: INTRAVENOUS | Status: DC

## 2021-10-29 MED ORDER — IOHEXOL 300 MG/ML  SOLN
100.0000 mL | Freq: Once | INTRAMUSCULAR | Status: AC | PRN
  Administered 2021-10-29: 20 mL via INTRA_ARTERIAL

## 2021-10-29 MED ORDER — HEPARIN SODIUM (PORCINE) 1000 UNIT/ML IJ SOLN
INTRAMUSCULAR | Status: AC
  Filled 2021-10-29: qty 10

## 2021-10-29 MED ORDER — MIDAZOLAM HCL 2 MG/2ML IJ SOLN
INTRAMUSCULAR | Status: AC
  Filled 2021-10-29: qty 2

## 2021-10-29 MED ORDER — FENTANYL CITRATE (PF) 100 MCG/2ML IJ SOLN
INTRAMUSCULAR | Status: DC | PRN
  Administered 2021-10-29 (×2): 25 ug via INTRAVENOUS

## 2021-10-29 MED ORDER — VERAPAMIL HCL 2.5 MG/ML IV SOLN
INTRAVENOUS | Status: AC
  Filled 2021-10-29: qty 2

## 2021-10-29 MED ORDER — VERAPAMIL HCL 2.5 MG/ML IV SOLN
INTRA_ARTERIAL | Status: DC | PRN
  Administered 2021-10-29: 9 mL via INTRA_ARTERIAL

## 2021-10-29 MED ORDER — HYDROCODONE-ACETAMINOPHEN 5-325 MG PO TABS: 1.0000 | ORAL_TABLET | ORAL | Status: DC | PRN

## 2021-10-29 MED ORDER — MIDAZOLAM HCL 2 MG/2ML IJ SOLN
INTRAMUSCULAR | Status: DC | PRN
  Administered 2021-10-29: .5 mg via INTRAVENOUS
  Administered 2021-10-29: 1 mg via INTRAVENOUS

## 2021-10-29 MED ORDER — SODIUM CHLORIDE (PF) 0.9 % IJ SOLN
INTRAVENOUS | Status: DC | PRN
  Administered 2021-10-29: 200 ug via INTRA_ARTERIAL

## 2021-10-29 MED ORDER — NITROGLYCERIN 1 MG/10 ML FOR IR/CATH LAB
INTRA_ARTERIAL | Status: AC
  Filled 2021-10-29: qty 10

## 2021-10-29 NOTE — Brief Op Note (Signed)
  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 Consuella Lose, MD  ANESTHESIA: IV Sedation with Local  EBL: Minimal  SPECIMENS: None  COMPLICATIONS: None  CONDITION: Stable to recovery  FINDINGS (Full report in [facility]): 1. 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   Consuella Lose, MD [facility]

## 2021-10-29 NOTE — Sedation Documentation (Signed)
Pt transported to [facility] in stretcher for recovery.

## 2021-10-29 NOTE — H&P (Signed)
Chief Complaint   Aneurysm  History of Present Illness  Amanda Henry is a 56 y.o. female presenting today for routine short-term cerebral angiogram.  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  She has made an excellent neurologic recovery.  Past Medical History   Past Medical History:  Diagnosis Date   Anxiety    Blood transfusion without reported diagnosis    Depression    Hypertension    no medications at this time   Hypothyroidism    Iron deficiency anemia    Thyroid disease     Past Surgical History   Past Surgical History:  Procedure Laterality Date   IR ANGIO INTRA EXTRACRAN SEL COM CAROTID INNOMINATE UNI L MOD SED  03/16/2021   IR ANGIO INTRA EXTRACRAN SEL COM CAROTID INNOMINATE UNI L MOD SED  03/26/2021   IR ANGIO INTRA EXTRACRAN SEL INTERNAL CAROTID UNI R MOD SED  03/15/2021   IR ANGIO INTRA EXTRACRAN SEL INTERNAL CAROTID UNI R MOD SED  03/26/2021   IR ANGIO INTRA EXTRACRAN SEL INTERNAL CAROTID UNI R MOD SED  03/25/2021   IR ANGIO VERTEBRAL SEL VERTEBRAL UNI L MOD SED  03/15/2021   IR ANGIO VERTEBRAL SEL VERTEBRAL UNI L MOD SED  03/26/2021   IR ANGIOGRAM FOLLOW UP STUDY  03/26/2021   IR ANGIOGRAM FOLLOW UP STUDY  03/26/2021   IR TRANSCATH/EMBOLIZ  03/26/2021   IR US GUIDE VASC ACCESS RIGHT  03/26/2021   LAPAROSCOPIC VAGINAL HYSTERECTOMY WITH SALPINGECTOMY Bilateral 10/01/2016   Procedure: LAPAROSCOPIC ASSISTED VAGINAL HYSTERECTOMY WITH SALPINGECTOMY;  Surgeon: Paula Compton, MD;  Location: [facility];  Service: Gynecology;  Laterality: Bilateral;   PARATHYROIDECTOMY Right 04/05/2021   Procedure: right inferioir PARATHYROIDECTOMY;  Surgeon: Armandina Gemma, MD;  Location: [facility];  Service: General;  Laterality: Right;   RADIOLOGY WITH ANESTHESIA N/A 03/15/2021   Procedure: IR WITH ANESTHESIA;  Surgeon: Consuella Lose, MD;  Location: [facility];  Service: Radiology;  Laterality: N/A;   RADIOLOGY WITH ANESTHESIA N/A 03/26/2021   Procedure: IR WITH ANESTHESIA (Coiling and Embolization);  Surgeon: Consuella Lose, MD;  Location: [facility];  Service: Radiology;  Laterality: N/A;   WISDOM TOOTH EXTRACTION      Social History   Social History   Tobacco Use   Smoking status: Never   Smokeless tobacco: Never  Substance Use Topics   Alcohol use: No   Drug use: No    Medications   Prior to Admission medications   Medication Sig Start Date End Date Taking? Authorizing Provider  ALPRAZolam (XANAX XR) 1 MG 24 hr tablet Take 1 mg by mouth daily as needed for sleep.   Yes [provider]  escitalopram (LEXAPRO) 10 MG tablet Take 1 tablet (10 mg total) by mouth daily. Patient taking differently: Take 10 mg by mouth at bedtime. 09/06/18  Yes [name], FNP  hydrochlorothiazide (HYDRODIURIL) 12.5 MG tablet Take 1 tablet (12.5 mg total) by mouth daily. 10/17/21  Yes [name], FNP  ipratropium (ATROVENT) 0.06 % nasal spray Place 2 sprays into the nose 3 (three) times daily. 08/26/21 08/26/22 Yes [name], FNP  levothyroxine (SYNTHROID) 150 MCG tablet TAKE 1 TABLET(150 MCG) BY MOUTH DAILY BEFORE BREAKFAST 10/25/21  Yes [name], FNP  meclizine (ANTIVERT) 12.5 MG tablet Take 1 tablet (12.5 mg total) by mouth 3 (three) times daily as needed for dizziness. 04/22/21  Yes [name], FNP  metFORMIN (GLUCOPHAGE) 500 MG tablet Take 1 tablet (500 mg total) by mouth 2 (two) times daily with a meal. 09/08/21 09/08/22 Yes [name], FNP  metoprolol tartrate (LOPRESSOR) 50 MG tablet Take 1 tablet (50 mg total) by mouth daily. 08/26/21  Yes [name], FNP  olmesartan (BENICAR) 20 MG tablet Take 1 tablet (20 mg total) by mouth daily. 10/12/21  Yes [name], FNP  Vitamin D, Ergocalciferol, (DRISDOL) 1.25 MG (50000 UNIT) CAPS capsule Take 1 capsule (50,000 Units total) by mouth every 7 (seven) days. 08/26/21  Yes [name],  FNP  Magnesium 250 MG TABS Take 1 tablet (250 mg total) by mouth daily. Take with evening meal 10/17/21   [name], FNP    Allergies   Allergies  Allergen Reactions   Latex Rash   Penicillins Rash    Has patient had a PCN reaction causing immediate rash, facial/tongue/throat swelling, SOB or lightheadedness with hypotension: Yes Has patient had a PCN reaction causing severe rash involving mucus membranes or skin necrosis: No Has patient had a PCN reaction that required hospitalization No Has patient had a PCN reaction occurring within the last 10 years: No If all of the above answers are "NO", then may proceed with Cephalosporin use.     Review of Systems  ROS  Neurologic Exam  Awake, alert, oriented Memory and concentration grossly intact Speech fluent, appropriate CN grossly intact Motor exam: Upper Extremities Deltoid Bicep Tricep Grip  Right 5/5 5/5 5/5 5/5  Left 5/5 5/5 5/5 5/5   Lower Extremities IP Quad PF DF EHL  Right 5/5 5/5 5/5 5/5 5/5  Left 5/5 5/5 5/5 5/5 5/5   Sensation grossly intact to LT  Impression  - 56 y.o. 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  She has made an excellent neurologic recovery.  Plan  -We will plan on proceeding with routine short-term follow-up diagnostic cerebral angiogram  I have reviewed the details of the procedure as well as the expected postoperative course and recovery with the patient.  We also discussed the risks of the procedure in the office.  All her questions today were answered and she provided informed consent to proceed.   NConsuella Lose MD [facility]

## 2021-11-05 ENCOUNTER — Ambulatory Visit: Payer: BC Managed Care – PPO | Admitting: Nurse Practitioner

## 2021-11-05 ENCOUNTER — Encounter: Payer: Self-pay | Admitting: Nurse Practitioner

## 2021-11-05 VITALS — BP 128/78 | HR 65 | Temp 98.2°F | Ht 67.0 in | Wt 204.0 lb

## 2021-11-05 DIAGNOSIS — E782 Mixed hyperlipidemia: Secondary | ICD-10-CM

## 2021-11-05 DIAGNOSIS — I10 Essential (primary) hypertension: Secondary | ICD-10-CM | POA: Diagnosis not present

## 2021-11-05 DIAGNOSIS — E1165 Type 2 diabetes mellitus with hyperglycemia: Secondary | ICD-10-CM | POA: Insufficient documentation

## 2021-11-05 DIAGNOSIS — E6609 Other obesity due to excess calories: Secondary | ICD-10-CM

## 2021-11-05 DIAGNOSIS — Z23 Encounter for immunization: Secondary | ICD-10-CM

## 2021-11-05 DIAGNOSIS — E039 Hypothyroidism, unspecified: Secondary | ICD-10-CM | POA: Diagnosis not present

## 2021-11-05 DIAGNOSIS — Z6831 Body mass index (BMI) 31.0-31.9, adult: Secondary | ICD-10-CM

## 2021-11-05 MED ORDER — OZEMPIC (0.25 OR 0.5 MG/DOSE) 2 MG/3ML ~~LOC~~ SOPN: 0.5000 mg | PEN_INJECTOR | SUBCUTANEOUS | 1 refills | Status: DC

## 2021-11-05 NOTE — Patient Instructions (Addendum)

## 2021-11-05 NOTE — Progress Notes (Signed)
I,[name],acting as a Education administrator for [employer], FNP.,have documented all relevant documentation on the behalf of [name], FNP,as directed by  [name], FNP while in the presence of [name], [place].   Subjective:     Patient ID: Amanda Henry , female    DOB: 12/09/1965 , 56 y.o.   MRN: [id number]   Chief Complaint  Patient presents with   Diabetes    HPI  Patient presents today for DM follow up. She is taking metformin and realizing she needs to take with food and good amounts of water. She has challenges with taking 2 a day. She is feeling light headed at times. She is taking xanax every night to help her to sleep, she gets from Dr. Toy Care and is aware she is doing nightly.   Wt Readings from Last 3 Encounters: 11/05/21 : 204 lb (92.5 kg) 10/29/21 : 200 lb (90.7 kg) 10/17/21 : 204 lb (92.5 kg)  She is trying to watch what she is eating. She is walking    Diabetes She presents for her follow-up diabetic visit. Diabetes type: prediabetes. There are no hypoglycemic associated symptoms. There are no diabetic associated symptoms. There are no hypoglycemic complications. There are no diabetic complications. Risk factors for coronary artery disease include obesity. Current diabetic treatment includes oral agent (monotherapy). She is following a generally unhealthy diet. When asked about meal planning, she reported none. She has not had a previous visit with a dietitian. She participates in exercise intermittently. Eye exam is not current.     Past Medical History:  Diagnosis Date   Anxiety    Blood transfusion without reported diagnosis    Depression    Hypertension    no medications at this time   Hypothyroidism    Iron deficiency anemia    Thyroid disease      Family History  Problem Relation Age of Onset   Thyroid disease Mother    Hypertension Mother    Diabetes Father    Hypertension Other    Diabetes Other    Thyroid disease Other    Heart disease  Other      Current Outpatient Medications:    ALPRAZolam (XANAX XR) 1 MG 24 hr tablet, Take 1 mg by mouth daily as needed for sleep., Disp: , Rfl:    escitalopram (LEXAPRO) 10 MG tablet, Take 1 tablet (10 mg total) by mouth daily. (Patient taking differently: Take 10 mg by mouth at bedtime.), Disp: 30 tablet, Rfl: 1   hydrochlorothiazide (HYDRODIURIL) 12.5 MG tablet, Take 1 tablet (12.5 mg total) by mouth daily., Disp: 30 tablet, Rfl: 3   ipratropium (ATROVENT) 0.06 % nasal spray, Place 2 sprays into the nose 3 (three) times daily., Disp: 15 mL, Rfl: 2   levothyroxine (SYNTHROID) 150 MCG tablet, TAKE 1 TABLET(150 MCG) BY MOUTH DAILY BEFORE BREAKFAST, Disp: 45 tablet, Rfl: 1   Magnesium 250 MG TABS, Take 1 tablet (250 mg total) by mouth daily. Take with evening meal, Disp: 30 tablet, Rfl: 2   meclizine (ANTIVERT) 12.5 MG tablet, Take 1 tablet (12.5 mg total) by mouth 3 (three) times daily as needed for dizziness., Disp: 30 tablet, Rfl: 0   metFORMIN (GLUCOPHAGE) 500 MG tablet, Take 1 tablet (500 mg total) by mouth 2 (two) times daily with a meal., Disp: 60 tablet, Rfl: 2   metoprolol tartrate (LOPRESSOR) 50 MG tablet, Take 1 tablet (50 mg total) by mouth daily., Disp: 90 tablet, Rfl: 1   olmesartan (BENICAR) 20 MG  tablet, Take 1 tablet (20 mg total) by mouth daily., Disp: 30 tablet, Rfl: 2   Semaglutide,0.25 or 0.'5MG'$ /DOS, (OZEMPIC, 0.25 OR 0.5 MG/DOSE,) 2 MG/3ML SOPN, Inject 0.5 mg into the skin once a week., Disp: 9 mL, Rfl: 1   Vitamin D, Ergocalciferol, (DRISDOL) 1.25 MG (50000 UNIT) CAPS capsule, Take 1 capsule (50,000 Units total) by mouth every 7 (seven) days., Disp: 12 capsule, Rfl: 1   Allergies  Allergen Reactions   Latex Rash   Penicillins Rash    Has patient had a PCN reaction causing immediate rash, facial/tongue/throat swelling, SOB or lightheadedness with hypotension: Yes Has patient had a PCN reaction causing severe rash involving mucus membranes or skin necrosis: No Has patient  had a PCN reaction that required hospitalization No Has patient had a PCN reaction occurring within the last 10 years: No If all of the above answers are "NO", then may proceed with Cephalosporin use.      Review of Systems  Constitutional: Negative.   Respiratory: Negative.    Cardiovascular: Negative.   Gastrointestinal: Negative.   Neurological: Negative.      Today's Vitals   11/05/21 1602  BP: 128/78  Pulse: 65  Temp: 98.2 F (36.8 C)  TempSrc: Oral  Weight: 204 lb (92.5 kg)  Height: '5\' 7"'$  (1.702 m)   Body mass index is 31.95 kg/m.  Wt Readings from Last 3 Encounters:  11/05/21 204 lb (92.5 kg)  10/29/21 200 lb (90.7 kg)  10/17/21 204 lb (92.5 kg)    Objective:  Physical Exam Vitals reviewed.  Constitutional:      General: She is not in acute distress.    Appearance: Normal appearance. She is obese.  Cardiovascular:     Rate and Rhythm: Normal rate and regular rhythm.     Pulses: Normal pulses.     Heart sounds: Normal heart sounds. No murmur heard. Pulmonary:     Effort: Pulmonary effort is normal. No respiratory distress.     Breath sounds: Normal breath sounds. No wheezing.  Skin:    General: Skin is warm and dry.     Capillary Refill: Capillary refill takes less than 2 seconds.  Neurological:     General: No focal deficit present.     Mental Status: She is alert and oriented to person, place, and time.     Cranial Nerves: No cranial nerve deficit.     Motor: No weakness.  Psychiatric:        Mood and Affect: Mood normal.        Behavior: Behavior normal.        Thought Content: Thought content normal.        Judgment: Judgment normal.         Assessment And Plan:     1. Uncontrolled type 2 diabetes mellitus with hyperglycemia ([place]) Comments: She is tolerating Metformin well. Encouraged to eat healthy diet and regular exercise - Microalbumin / Creatinine Urine Ratio - Semaglutide,0.25 or 0.'5MG'$ /DOS, (OZEMPIC, 0.25 OR 0.5 MG/DOSE,) 2 MG/3ML  SOPN; Inject 0.5 mg into the skin once a week.  Dispense: 9 mL; Refill: 1 - Hemoglobin A1c - Amb Referral To Provider Referral Exercise Program (P.R.E.P)  2. Essential hypertension Comments: Blood pressure is much better controlled, continue current medications.  3. Mixed hyperlipidemia Comments: Stable, continue statin. Tolerating well  4. Acquired hypothyroidism Comments: She is followed by Endocrinology but has not been seen in several months. - TSH - T4 - T3, free  5. Class 1 obesity due  to excess calories with serious comorbidity and body mass index (BMI) of 31.0 to 31.9 in adult She is encouraged to strive for BMI less than 30 to decrease cardiac risk. Advised to aim for at least 150 minutes of exercise per week. Will refer to PREP to help get her moving more while she is out of work - Amb Referral To Provider Referral Exercise Program (P.R.E.P)  6. Encounter for immunization Shingrix vaccine given in office - Varicella-zoster vaccine IM (Shingrix)      Patient was given opportunity to ask questions. Patient verbalized understanding of the plan and was able to repeat key elements of the plan. All questions were answered to their satisfaction.  [name], FNP    I, [name], FNP, have reviewed all documentation for this visit. The documentation on 11/05/21 for the exam, diagnosis, procedures, and orders are all accurate and complete.  IF YOU HAVE BEEN REFERRED TO A SPECIALIST, IT MAY TAKE 1-2 WEEKS TO SCHEDULE/PROCESS THE REFERRAL. IF YOU HAVE NOT HEARD FROM US/SPECIALIST IN TWO WEEKS, PLEASE GIVE [place] A CALL AT [phone].   THE PATIENT IS ENCOURAGED TO PRACTICE SOCIAL DISTANCING DUE TO THE COVID-19 PANDEMIC.

## 2021-11-06 LAB — T3, FREE: T3, Free: 3.1 pg/mL (ref 2.0–4.4)

## 2021-11-06 LAB — MICROALBUMIN / CREATININE URINE RATIO
Creatinine, Urine: 51.7 mg/dL
Microalb/Creat Ratio: <6 mg/g{creat} (ref 0–29)
Microalbumin, Urine: <3 ug/mL

## 2021-11-06 LAB — T4: T4, Total: 11.3 ug/dL (ref 4.5–12.0)

## 2021-11-06 LAB — HEMOGLOBIN A1C
Est. average glucose Bld gHb Est-mCnc: 169 mg/dL
Hgb A1c MFr Bld: 7.5 % — ABNORMAL HIGH (ref 4.8–5.6)

## 2021-11-06 LAB — TSH: TSH: 0.225 u[IU]/mL — ABNORMAL LOW (ref 0.450–4.500)

## 2021-11-17 ENCOUNTER — Encounter: Payer: Self-pay | Admitting: Nurse Practitioner

## 2021-11-25 ENCOUNTER — Other Ambulatory Visit: Payer: Self-pay | Admitting: Internal Medicine

## 2021-11-25 ENCOUNTER — Telehealth: Payer: Self-pay

## 2021-11-25 ENCOUNTER — Encounter: Payer: Self-pay | Admitting: Nurse Practitioner

## 2021-11-25 ENCOUNTER — Encounter: Payer: Self-pay | Admitting: Internal Medicine

## 2021-11-25 DIAGNOSIS — E039 Hypothyroidism, unspecified: Secondary | ICD-10-CM

## 2021-11-25 MED ORDER — LEVOTHYROXINE SODIUM 137 MCG PO TABS: ORAL_TABLET | ORAL | 3 refills | Status: DC

## 2021-11-25 NOTE — Telephone Encounter (Signed)
Pt contacted office to discuss scheduling an appt sooner then 06/2022. PT had lab work done and thyroid is currently overactive. Pt wanted to know if she needed to come in.

## 2021-11-27 ENCOUNTER — Other Ambulatory Visit: Payer: Self-pay | Admitting: Nurse Practitioner

## 2021-11-28 ENCOUNTER — Other Ambulatory Visit: Payer: BC Managed Care – PPO

## 2021-12-04 ENCOUNTER — Other Ambulatory Visit: Payer: Self-pay

## 2021-12-04 ENCOUNTER — Encounter: Payer: Self-pay | Admitting: Nurse Practitioner

## 2021-12-04 ENCOUNTER — Telehealth: Payer: BC Managed Care – PPO | Admitting: Physician Assistant

## 2021-12-04 ENCOUNTER — Encounter (HOSPITAL_COMMUNITY): Payer: Self-pay

## 2021-12-04 ENCOUNTER — Emergency Department (HOSPITAL_COMMUNITY)
Admission: EM | Admit: 2021-12-04 | Discharge: 2021-12-05 | Disposition: A | Discharge status: Home / Self Care | Payer: BC Managed Care – PPO | Attending: Emergency Medicine | Admitting: Emergency Medicine

## 2021-12-04 DIAGNOSIS — I10 Essential (primary) hypertension: Secondary | ICD-10-CM | POA: Insufficient documentation

## 2021-12-04 DIAGNOSIS — I160 Hypertensive urgency: Secondary | ICD-10-CM | POA: Insufficient documentation

## 2021-12-04 DIAGNOSIS — Z91040 Latex allergy status: Secondary | ICD-10-CM | POA: Insufficient documentation

## 2021-12-04 DIAGNOSIS — E039 Hypothyroidism, unspecified: Secondary | ICD-10-CM | POA: Insufficient documentation

## 2021-12-04 DIAGNOSIS — R42 Dizziness and giddiness: Secondary | ICD-10-CM | POA: Diagnosis present

## 2021-12-04 DIAGNOSIS — Z79899 Other long term (current) drug therapy: Secondary | ICD-10-CM | POA: Insufficient documentation

## 2021-12-04 LAB — CBC
HCT: 45.1 % (ref 36.0–46.0)
Hemoglobin: 14.5 g/dL (ref 12.0–15.0)
MCH: 28.1 pg (ref 26.0–34.0)
MCHC: 32.2 g/dL (ref 30.0–36.0)
MCV: 87.4 fL (ref 80.0–100.0)
Platelets: 332 10*3/uL (ref 150–400)
RBC: 5.16 MIL/uL — ABNORMAL HIGH (ref 3.87–5.11)
RDW: 14.6 % (ref 11.5–15.5)
WBC: 6.3 10*3/uL (ref 4.0–10.5)
nRBC: 0 % (ref 0.0–0.2)

## 2021-12-04 LAB — COMPREHENSIVE METABOLIC PANEL
ALT: 16 U/L (ref 0–44)
AST: 18 U/L (ref 15–41)
Albumin: 4.1 g/dL (ref 3.5–5.0)
Alkaline Phosphatase: 75 U/L (ref 38–126)
Anion gap: 8 (ref 5–15)
BUN: 15 mg/dL (ref 6–20)
CO2: 28 mmol/L (ref 22–32)
Calcium: 9.4 mg/dL (ref 8.9–10.3)
Chloride: 102 mmol/L (ref 98–111)
Creatinine, Ser: 0.89 mg/dL (ref 0.44–1.00)
GFR, Estimated: >60 mL/min (ref >60)
Glucose, Bld: 103 mg/dL — ABNORMAL HIGH (ref 70–99)
Potassium: 3.9 mmol/L (ref 3.5–5.1)
Sodium: 138 mmol/L (ref 135–145)
Total Bilirubin: 0.5 mg/dL (ref 0.3–1.2)
Total Protein: 8.8 g/dL — ABNORMAL HIGH (ref 6.5–8.1)

## 2021-12-04 LAB — I-STAT BETA HCG BLOOD, ED (MC, WL, AP ONLY): I-stat hCG, quantitative: <5 m[IU]/mL (ref <5)

## 2021-12-04 LAB — TSH: TSH: 0.35 u[IU]/mL (ref 0.350–4.500)

## 2021-12-04 NOTE — Progress Notes (Signed)
Virtual Visit Consent   Amanda Henry, you are scheduled for a virtual visit with a [facility] provider today. Just as with appointments in the office, your consent must be obtained to participate. Your consent will be active for this visit and any virtual visit you may have with one of our providers in the next 365 days. If you have a MyChart account, a copy of this consent can be sent to you electronically.  As this is a virtual visit, video technology does not allow for your provider to perform a traditional examination. This may limit your provider's ability to fully assess your condition. If your provider identifies any concerns that need to be evaluated in person or the need to arrange testing (such as labs, EKG, etc.), we will make arrangements to do so. Although advances in technology are sophisticated, we cannot ensure that it will always work on either your end or our end. If the connection with a video visit is poor, the visit may have to be switched to a telephone visit. With either a video or telephone visit, we are not always able to ensure that we have a secure connection.  By engaging in this virtual visit, you consent to the provision of healthcare and authorize for your insurance to be billed (if applicable) for the services provided during this visit. Depending on your insurance coverage, you may receive a charge related to this service.  I need to obtain your verbal consent now. Are you willing to proceed with your visit today? Chaniqua Brisby has provided verbal consent on 12/04/2021 for a virtual visit (video or telephone). Amanda Henry, [place]  Date: 12/04/2021 6:26 PM  Virtual Visit via Video Note   I, Amanda Henry, connected with  Amanda Henry  ([id number], [birth year]) on 12/04/21 at  6:15 PM EDT by a video-enabled telemedicine application and verified that I am speaking with the correct person using two identifiers.  Location: Patient: Virtual Visit  Location Patient: Home Provider: Virtual Visit Location Provider: Home Office   I discussed the limitations of evaluation and management by telemedicine and the availability of in person appointments. The patient expressed understanding and agreed to proceed.    History of Present Illness: Amanda Henry is a 56 y.o. who identifies as a female who was assigned female at birth, and is being seen today for feeling unwell over the past few days after starting increased dose of Ozempic. Notes nausea, fatigue and occasional clammy sensation. Denies lightheadedness, dizziness, palpitations, chest pain or SOB. Checked her BP today and it has been very high, with most recent BP level at 186/112 despite her antihypertensive medications. She is unsure what to do.  HPI: HPI  Problems:  Patient Active Problem List   Diagnosis Date Noted   Uncontrolled type 2 diabetes mellitus with hyperglycemia ([place]) 11/05/2021   Essential hypertension 11/05/2021   Class 1 obesity due to excess calories with serious comorbidity and body mass index (BMI) of 31.0 to 31.9 in adult 11/05/2021   Hypercalcemia 04/03/2021   Hypokalemia 04/03/2021   History of subarachnoid hemorrhage 04/03/2021   Hyperparathyroidism ([place]) 04/03/2021   SAH (subarachnoid hemorrhage) ([place]) 03/15/2021   Mixed hyperlipidemia 06/03/2018   Prediabetes 06/03/2018   S/P laparoscopic assisted vaginal hysterectomy (LAVH) 10/01/2016   Menorrhagia 09/10/2016   Iron deficiency anemia 05/06/2016   Acquired hypothyroidism 05/24/2013   Depression with anxiety 05/24/2013    Allergies:  Allergies  Allergen Reactions   Latex Rash   Penicillins Rash  Has patient had a PCN reaction causing immediate rash, facial/tongue/throat swelling, SOB or lightheadedness with hypotension: Yes Has patient had a PCN reaction causing severe rash involving mucus membranes or skin necrosis: No Has patient had a PCN reaction that required hospitalization No Has patient  had a PCN reaction occurring within the last 10 years: No If all of the above answers are "NO", then may proceed with Cephalosporin use.    Medications:  Current Outpatient Medications:    ALPRAZolam (XANAX XR) 1 MG 24 hr tablet, Take 1 mg by mouth daily as needed for sleep., Disp: , Rfl:    escitalopram (LEXAPRO) 10 MG tablet, Take 1 tablet (10 mg total) by mouth daily. (Patient taking differently: Take 10 mg by mouth at bedtime.), Disp: 30 tablet, Rfl: 1   hydrochlorothiazide (HYDRODIURIL) 12.5 MG tablet, Take 1 tablet (12.5 mg total) by mouth daily., Disp: 30 tablet, Rfl: 3   ipratropium (ATROVENT) 0.06 % nasal spray, Place 2 sprays into the nose 3 (three) times daily., Disp: 15 mL, Rfl: 2   levothyroxine (SYNTHROID) 137 MCG tablet, TAKE 1 TABLET(150 MCG) BY MOUTH DAILY BEFORE BREAKFAST, Disp: 45 tablet, Rfl: 3   Magnesium 250 MG TABS, Take 1 tablet (250 mg total) by mouth daily. Take with evening meal, Disp: 30 tablet, Rfl: 2   meclizine (ANTIVERT) 12.5 MG tablet, Take 1 tablet (12.5 mg total) by mouth 3 (three) times daily as needed for dizziness., Disp: 30 tablet, Rfl: 0   metoprolol tartrate (LOPRESSOR) 50 MG tablet, Take 1 tablet (50 mg total) by mouth daily., Disp: 90 tablet, Rfl: 1   olmesartan (BENICAR) 20 MG tablet, Take 1 tablet (20 mg total) by mouth daily., Disp: 30 tablet, Rfl: 2   Semaglutide,0.25 or 0.'5MG'$ /DOS, (OZEMPIC, 0.25 OR 0.5 MG/DOSE,) 2 MG/3ML SOPN, Inject 0.5 mg into the skin once a week., Disp: 9 mL, Rfl: 1   Vitamin D, Ergocalciferol, (DRISDOL) 1.25 MG (50000 UNIT) CAPS capsule, Take 1 capsule (50,000 Units total) by mouth every 7 (seven) days., Disp: 12 capsule, Rfl: 1  Observations/Objective: Patient is well-developed, well-nourished in no acute distress.  Resting comfortably at home.  Head is normocephalic, atraumatic.  No labored breathing. Speech is clear and coherent with logical content.  Patient is alert and oriented at baseline.   Assessment and  Plan: 1. Uncontrolled hypertension  BP quite high with some associated nausea but no other symptoms currently. She needs in-person evaluation at present time for heart and lung exam, manual BP check and re-evaluation of her thyroid levels as she was starting to get more hyperthyroid at last check, prompting a reduction in her levothyroxine dose. Resources given so she can be evaluated in-person this evening. She is having someone take her for evaluation momentarily.   Follow Up Instructions: I discussed the assessment and treatment plan with the patient. The patient was provided an opportunity to ask questions and all were answered. The patient agreed with the plan and demonstrated an understanding of the instructions.  A copy of instructions were sent to the patient via MyChart unless otherwise noted below.   The patient was advised to call back or seek an in-person evaluation if the symptoms worsen or if the condition fails to improve as anticipated.  Time:  I spent 10 minutes with the patient via telehealth technology discussing the above problems/concerns.    Amanda Rio, PA-C

## 2021-12-04 NOTE — Patient Instructions (Signed)
  Amanda Henry, thank you for joining [name], PA-C for today's virtual visit.  While this provider is not your primary care provider (PCP), if your PCP is located in our provider database this encounter information will be shared with them immediately following your visit.  Consent: (Patient) Amanda Henry provided verbal consent for this virtual visit at the beginning of the encounter.  Current Medications:  Current Outpatient Medications:    ALPRAZolam (XANAX XR) 1 MG 24 hr tablet, Take 1 mg by mouth daily as needed for sleep., Disp: , Rfl:    escitalopram (LEXAPRO) 10 MG tablet, Take 1 tablet (10 mg total) by mouth daily. (Patient taking differently: Take 10 mg by mouth at bedtime.), Disp: 30 tablet, Rfl: 1   hydrochlorothiazide (HYDRODIURIL) 12.5 MG tablet, Take 1 tablet (12.5 mg total) by mouth daily., Disp: 30 tablet, Rfl: 3   ipratropium (ATROVENT) 0.06 % nasal spray, Place 2 sprays into the nose 3 (three) times daily., Disp: 15 mL, Rfl: 2   levothyroxine (SYNTHROID) 137 MCG tablet, TAKE 1 TABLET(150 MCG) BY MOUTH DAILY BEFORE BREAKFAST, Disp: 45 tablet, Rfl: 3   Magnesium 250 MG TABS, Take 1 tablet (250 mg total) by mouth daily. Take with evening meal, Disp: 30 tablet, Rfl: 2   meclizine (ANTIVERT) 12.5 MG tablet, Take 1 tablet (12.5 mg total) by mouth 3 (three) times daily as needed for dizziness., Disp: 30 tablet, Rfl: 0   metoprolol tartrate (LOPRESSOR) 50 MG tablet, Take 1 tablet (50 mg total) by mouth daily., Disp: 90 tablet, Rfl: 1   olmesartan (BENICAR) 20 MG tablet, Take 1 tablet (20 mg total) by mouth daily., Disp: 30 tablet, Rfl: 2   Semaglutide,0.25 or 0.'5MG'$ /DOS, (OZEMPIC, 0.25 OR 0.5 MG/DOSE,) 2 MG/3ML SOPN, Inject 0.5 mg into the skin once a week., Disp: 9 mL, Rfl: 1   Vitamin D, Ergocalciferol, (DRISDOL) 1.25 MG (50000 UNIT) CAPS capsule, Take 1 capsule (50,000 Units total) by mouth every 7 (seven) days., Disp: 12 capsule, Rfl: 1   Medications ordered in  this encounter:  No orders of the defined types were placed in this encounter.    *If you need refills on other medications prior to your next appointment, please contact your pharmacy*  Follow-Up: Call back or seek an in-person evaluation if the symptoms worsen or if the condition fails to improve as anticipated.   If you have been instructed to have an in-person evaluation today at a local Urgent Care facility, please use the link below. It will take you to a list of all of our available [facility], including address, phone number and hours of operation. Please do not delay care.  [facility]  If you or a family member do not have a primary care provider, use the link below to schedule a visit and establish care. When you choose a [facility] primary care physician or advanced practice provider, you gain a long-term partner in health. Find a Primary Care Provider  Learn more about 's in-office and virtual care options: [facility]

## 2021-12-04 NOTE — ED Provider Triage Note (Signed)
Emergency Medicine Provider Triage Evaluation Note  Amanda Henry , a 56 y.o. female  was evaluated in triage.  Pt complains of not feeling well. Report her thyroid med was changed last week, her ozempic dose was increased yesterday and today pt feels weak, dizzy/lighthead and overall not feeling well.  Endorse nausea without vomiting.  No fever, headache, confusion.   Review of Systems  Positive: As above Negative: As above  Physical Exam  BP (!) 199/101 (BP Location: Right Arm)   Pulse 62   Temp 98.4 F (36.9 C) (Oral)   Resp 18   LMP 08/28/2016 (Exact Date) Comment: come back on 09/15/2016  SpO2 95%  Gen:   Awake, no distress   Resp:  Normal effort  MSK:   Moves extremities without difficulty  Other:    Medical Decision Making  Medically screening exam initiated at 9:00 PM.  Appropriate orders placed.  Amanda Henry was informed that the remainder of the evaluation will be completed by another provider, this initial triage assessment does not replace that evaluation, and the importance of remaining in the ED until their evaluation is complete.     Domenic Moras, PA-C 12/04/21 2106

## 2021-12-04 NOTE — ED Triage Notes (Signed)
Pt states that she had her Ozempic increased yesterday and started feeling yucky today. Pt states that her skin was clammy so she checked her bp and it was high. Pt went to UC and was told to come here. Pt states that her left arm and wrist are hurting.

## 2021-12-05 LAB — URINALYSIS, ROUTINE W REFLEX MICROSCOPIC
Bilirubin Urine: NEGATIVE
Glucose, UA: NEGATIVE mg/dL
Ketones, ur: NEGATIVE mg/dL
Leukocytes,Ua: NEGATIVE
Nitrite: NEGATIVE
Protein, ur: NEGATIVE mg/dL
Specific Gravity, Urine: 1.004 — ABNORMAL LOW (ref 1.005–1.030)
pH: 6 (ref 5.0–8.0)

## 2021-12-05 MED ORDER — CLONIDINE HCL 0.1 MG PO TABS
ORAL_TABLET | ORAL | Status: AC
  Administered 2021-12-05: 0.2 mg via ORAL
  Filled 2021-12-05: qty 2

## 2021-12-05 MED ORDER — CLONIDINE HCL 0.1 MG PO TABS: 0.2000 mg | ORAL_TABLET | Freq: Once | ORAL | Status: AC

## 2021-12-05 NOTE — Discharge Instructions (Signed)
Keep a record of your blood pressures over the next week and follow-up with your primary doctor for a recheck.  Return to the ER if symptoms significantly worsen or change.

## 2021-12-05 NOTE — ED Provider Notes (Signed)
[facility] DEPT Provider Note   CSN: [id number] Arrival date & time: 12/04/21  2004     History  Chief Complaint  Patient presents with   Hypertension    Amanda Henry is a 56 y.o. female.  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  Patient presenting today with complaints of feeling generally unwell and elevated blood pressure.  She reports feeling somewhat dizzy and lightheaded, but denies any headache.  She denies any numbness or tingling.  She took her blood pressures at home and were 086V to 784O systolic.  Patient also reports having an increase in her dose of Ozempic starting yesterday.  The history is provided by the patient.       Home Medications Prior to Admission medications   Medication Sig Start Date End Date Taking? Authorizing Provider  ALPRAZolam (XANAX XR) 1 MG 24 hr tablet Take 1 mg by mouth daily as needed for sleep.   Yes [provider]  escitalopram (LEXAPRO) 10 MG tablet Take 1 tablet (10 mg total) by mouth daily. 09/06/18  Yes [name], FNP  levothyroxine (SYNTHROID) 137 MCG tablet TAKE 1 TABLET(150 MCG) BY MOUTH DAILY BEFORE BREAKFAST Patient taking differently: Take 137 mcg by mouth daily before breakfast. 11/25/21  Yes Philemon Kingdom, MD  meclizine (ANTIVERT) 12.5 MG tablet Take 1 tablet (12.5 mg total) by mouth 3 (three) times daily as needed for dizziness. 04/22/21  Yes [name], FNP  metoprolol tartrate (LOPRESSOR) 50 MG tablet Take 1 tablet (50 mg total) by mouth daily. 08/26/21  Yes [name], FNP  olmesartan (BENICAR) 20 MG tablet Take 1 tablet (20 mg total) by mouth daily. 10/12/21  Yes [name], FNP  Semaglutide,0.25 or 0.'5MG'$ /DOS, (OZEMPIC, 0.25 OR 0.5 MG/DOSE,) 2 MG/3ML SOPN Inject 0.5 mg into the skin once a week. Patient taking differently: Inject 0.5 mg into  the skin once a week. Tuesday 11/05/21  Yes [name], FNP  Vitamin D, Ergocalciferol, (DRISDOL) 1.25 MG (50000 UNIT) CAPS capsule Take 1 capsule (50,000 Units total) by mouth every 7 (seven) days. Patient taking differently: Take 50,000 Units by mouth every 7 (seven) days. Wednesday 08/26/21  Yes [name], FNP  hydrochlorothiazide (HYDRODIURIL) 12.5 MG tablet Take 1 tablet (12.5 mg total) by mouth daily. Patient not taking: Reported on 12/05/2021 10/17/21   [name], FNP  ipratropium (ATROVENT) 0.06 % nasal spray Place 2 sprays into the nose 3 (three) times daily. Patient not taking: Reported on 12/05/2021 08/26/21 08/26/22  [name], FNP  Magnesium 250 MG TABS Take 1 tablet (250 mg total) by mouth daily. Take with evening meal Patient not taking: Reported on 12/05/2021 10/17/21   [name], FNP      Allergies    Latex and Penicillins    Review of Systems   Review of Systems  All other systems reviewed and are negative.   Physical Exam Updated Vital Signs BP 109/81 (BP Location: Right Arm)   Pulse 62   Temp 97.7 F (36.5 C) (Oral)   Resp 16   LMP 08/28/2016 (Exact Date) Comment: come back on 09/15/2016  SpO2 99%  Physical Exam Vitals and nursing note reviewed.  Constitutional:      General: She is not in acute distress.    Appearance: She is well-developed. She is not diaphoretic.  HENT:     Head: Normocephalic and atraumatic.  Cardiovascular:     Rate and  Rhythm: Normal rate and regular rhythm.     Heart sounds: No murmur heard.    No friction rub. No gallop.  Pulmonary:     Effort: Pulmonary effort is normal. No respiratory distress.     Breath sounds: Normal breath sounds. No wheezing.  Abdominal:     General: Bowel sounds are normal. There is no distension.     Palpations: Abdomen is soft.     Tenderness: There is no abdominal tenderness.  Musculoskeletal:        General: Normal range of motion.     Cervical back: Normal range of motion and neck supple.   Skin:    General: Skin is warm and dry.  Neurological:     General: No focal deficit present.     Mental Status: She is alert and oriented to person, place, and time.     Cranial Nerves: No cranial nerve deficit.     Motor: No weakness.     Coordination: Coordination normal.     ED Results / Procedures / Treatments   Labs (all labs ordered are listed, but only abnormal results are displayed) Labs Reviewed  CBC - Abnormal; Notable for the following components:      Result Value   RBC 5.16 (*)    All other components within normal limits  URINALYSIS, ROUTINE W REFLEX MICROSCOPIC - Abnormal; Notable for the following components:   Color, Urine STRAW (*)    Specific Gravity, Urine 1.004 (*)    Hgb urine dipstick SMALL (*)    Bacteria, UA FEW (*)    All other components within normal limits  COMPREHENSIVE METABOLIC PANEL - Abnormal; Notable for the following components:   Glucose, Bld 103 (*)    Total Protein 8.8 (*)    All other components within normal limits  TSH  I-STAT BETA HCG BLOOD, ED ([facility], [facility], [facility] ONLY)  CBG MONITORING, ED    EKG None  Radiology No results found.  Procedures Procedures    Medications Ordered in ED Medications  cloNIDine (CATAPRES) tablet 0.2 mg (0.2 mg Oral Given 12/05/21 0131)    ED Course/ Medical Decision Making/ A&P  Patient presenting here with complaints of feeling generally unwell and elevated blood pressure.  I am uncertain as to the exact etiology of this, however nothing today appears emergent.  Patient's vitals are stable and laboratory studies are reassuring.  Patient was given clonidine with some improvement in her blood pressure.  At this point, I feel as though she can safely be discharged with follow-up with primary doctor and as needed return.  Final Clinical Impression(s) / ED Diagnoses Final diagnoses:  Hypertensive urgency    Rx / DC Orders ED Discharge Orders     None         Veryl Speak, MD 12/05/21  [phone]

## 2021-12-09 ENCOUNTER — Encounter: Payer: Self-pay | Admitting: Nurse Practitioner

## 2021-12-17 ENCOUNTER — Encounter: Payer: Self-pay | Admitting: Nurse Practitioner

## 2021-12-18 ENCOUNTER — Encounter: Payer: Self-pay | Admitting: Nurse Practitioner

## 2021-12-18 ENCOUNTER — Ambulatory Visit: Payer: BC Managed Care – PPO | Admitting: Nurse Practitioner

## 2021-12-18 ENCOUNTER — Other Ambulatory Visit: Payer: Self-pay

## 2021-12-18 VITALS — Temp 98.1°F | Ht 67.0 in | Wt 195.0 lb

## 2021-12-18 DIAGNOSIS — E6609 Other obesity due to excess calories: Secondary | ICD-10-CM | POA: Diagnosis not present

## 2021-12-18 DIAGNOSIS — I10 Essential (primary) hypertension: Secondary | ICD-10-CM | POA: Diagnosis not present

## 2021-12-18 DIAGNOSIS — R42 Dizziness and giddiness: Secondary | ICD-10-CM | POA: Diagnosis not present

## 2021-12-18 DIAGNOSIS — Z6830 Body mass index (BMI) 30.0-30.9, adult: Secondary | ICD-10-CM

## 2021-12-18 DIAGNOSIS — E669 Obesity, unspecified: Secondary | ICD-10-CM

## 2021-12-18 DIAGNOSIS — E1169 Type 2 diabetes mellitus with other specified complication: Secondary | ICD-10-CM | POA: Diagnosis not present

## 2021-12-18 MED ORDER — OLMESARTAN MEDOXOMIL 20 MG PO TABS: 20.0000 mg | ORAL_TABLET | Freq: Every day | ORAL | 1 refills | Status: DC

## 2021-12-18 MED ORDER — BLOOD GLUCOSE MONITOR KIT: PACK | 0 refills | Status: DC

## 2021-12-18 MED ORDER — MECLIZINE HCL 12.5 MG PO TABS: 12.5000 mg | ORAL_TABLET | Freq: Three times a day (TID) | ORAL | 1 refills | Status: AC | PRN

## 2021-12-18 MED ORDER — GVOKE HYPOPEN 2-PACK 0.5 MG/0.1ML ~~LOC~~ SOAJ: 0.1000 mg | SUBCUTANEOUS | 6 refills | Status: AC | PRN

## 2021-12-18 NOTE — Progress Notes (Signed)
Barnet Glasgow Martin,acting as a Education administrator for [name], FNP.,have documented all relevant documentation on the behalf of [name], FNP,as directed by  [name], FNP while in the presence of [name], [place].  Subjective:     Patient ID: Amanda Henry , female    DOB: [birth year] , 56 y.o.   MRN: [id number]   Chief Complaint  Patient presents with   Hypertension    HPI  Patient presents today for a bp check. She was seen at the ER on 11/26/2021, her blood pressure went up to 150/120. She did have a video visit was advised to go to urgent care and sent to the ER due to having palpitations. Pt states that she is having issues with her BP and nausea.   Dr Toy Care decreased her alprazolam to 0.5 mg from 1 mg, Sunday was the last day she took her 1 mg and did not take for 2 days and has restarted on alprazolam. She is to start counseling today.   Wt Readings from Last 3 Encounters: 12/18/21 : 195 lb (88.5 kg) 11/05/21 : 204 lb (92.5 kg) 10/29/21 : 200 lb (90.7 kg)    Hypertension This is a chronic problem. The current episode started more than 1 year ago. The problem has been gradually worsening since onset. The problem is uncontrolled. Associated symptoms include anxiety. Pertinent negatives include no chest pain, headaches or palpitations. Risk factors for coronary artery disease include obesity and sedentary lifestyle.     Past Medical History:  Diagnosis Date   Anxiety    Blood transfusion without reported diagnosis    Depression    Hypertension    no medications at this time   Hypothyroidism    Iron deficiency anemia    Thyroid disease      Family History  Problem Relation Age of Onset   Thyroid disease Mother    Hypertension Mother    Diabetes Father    Hypertension Other    Diabetes Other    Thyroid disease Other    Heart disease Other      Current Outpatient Medications:    ALPRAZolam (XANAX XR) 1 MG 24 hr tablet, Take 0.5 mg by mouth daily as needed for  sleep., Disp: , Rfl:    blood glucose meter kit and supplies KIT, Dispense based on patient and insurance preference. Use up to four times daily as directed., Disp: 1 each, Rfl: 0   escitalopram (LEXAPRO) 10 MG tablet, Take 1 tablet (10 mg total) by mouth daily., Disp: 30 tablet, Rfl: 1   Glucagon (GVOKE HYPOPEN 2-PACK) 0.5 MG/0.1ML SOAJ, Inject 0.1 mg into the skin as needed., Disp: 0.1 mL, Rfl: 6   levothyroxine (SYNTHROID) 137 MCG tablet, TAKE 1 TABLET(150 MCG) BY MOUTH DAILY BEFORE BREAKFAST (Patient taking differently: Take 137 mcg by mouth daily before breakfast.), Disp: 45 tablet, Rfl: 3   metoprolol tartrate (LOPRESSOR) 50 MG tablet, Take 1 tablet (50 mg total) by mouth daily., Disp: 90 tablet, Rfl: 1   Semaglutide,0.25 or 0.5MG/DOS, (OZEMPIC, 0.25 OR 0.5 MG/DOSE,) 2 MG/3ML SOPN, Inject 0.5 mg into the skin once a week. (Patient taking differently: Inject 0.5 mg into the skin once a week. Tuesday), Disp: 9 mL, Rfl: 1   Vitamin D, Ergocalciferol, (DRISDOL) 1.25 MG (50000 UNIT) CAPS capsule, Take 1 capsule (50,000 Units total) by mouth every 7 (seven) days. (Patient taking differently: Take 50,000 Units by mouth every 7 (seven) days. Wednesday), Disp: 12 capsule, Rfl: 1   ipratropium (ATROVENT) 0.06 %  nasal spray, Place 2 sprays into the nose 3 (three) times daily. (Patient not taking: Reported on 12/05/2021), Disp: 15 mL, Rfl: 2   meclizine (ANTIVERT) 12.5 MG tablet, Take 1 tablet (12.5 mg total) by mouth 3 (three) times daily as needed for dizziness., Disp: 30 tablet, Rfl: 1   olmesartan (BENICAR) 20 MG tablet, Take 1 tablet (20 mg total) by mouth daily., Disp: 90 tablet, Rfl: 1   Allergies  Allergen Reactions   Latex Rash   Penicillins Rash    Has patient had a PCN reaction causing immediate rash, facial/tongue/throat swelling, SOB or lightheadedness with hypotension: Yes Has patient had a PCN reaction causing severe rash involving mucus membranes or skin necrosis: No Has patient had a PCN  reaction that required hospitalization No Has patient had a PCN reaction occurring within the last 10 years: No If all of the above answers are "NO", then may proceed with Cephalosporin use.      Review of Systems  Constitutional: Negative.   Respiratory: Negative.    Cardiovascular: Negative.  Negative for chest pain, palpitations and leg swelling.  Gastrointestinal:  Positive for nausea.  Neurological:  Positive for dizziness (when laying down or standing up. Has not taken any meclizine). Negative for headaches.  Psychiatric/Behavioral: Negative.       Today's Vitals   12/18/21 1054  Temp: 98.1 F (36.7 C)  TempSrc: Oral  Weight: 195 lb (88.5 kg)  Height: _0  (1.702 m)   Body mass index is 30.54 kg/m.  Wt Readings from Last 3 Encounters:  12/18/21 195 lb (88.5 kg)  11/05/21 204 lb (92.5 kg)  10/29/21 200 lb (90.7 kg)    Objective:  Physical Exam Vitals reviewed.  Constitutional:      General: She is not in acute distress.    Appearance: Normal appearance. She is obese.  Cardiovascular:     Rate and Rhythm: Normal rate and regular rhythm.     Pulses: Normal pulses.     Heart sounds: Normal heart sounds. No murmur heard. Pulmonary:     Effort: Pulmonary effort is normal. No respiratory distress.     Breath sounds: Normal breath sounds. No wheezing.  Skin:    General: Skin is warm and dry.     Capillary Refill: Capillary refill takes less than 2 seconds.  Neurological:     General: No focal deficit present.     Mental Status: She is alert and oriented to person, place, and time.     Cranial Nerves: No cranial nerve deficit.     Motor: No weakness.  Psychiatric:        Mood and Affect: Mood normal.        Behavior: Behavior normal.        Thought Content: Thought content normal.        Judgment: Judgment normal.         Assessment And Plan:     1. Uncontrolled hypertension Comments: She continues to have an elevated blood pressure and frequent urgent  care and ER visits in spite of her metoprolol and olmesartan, she is sensitive to [facility] - Ambulatory referral to [facility] - olmesartan (BENICAR) 20 MG tablet; Take 1 tablet (20 mg total) by mouth daily.  Dispense: 90 tablet; Refill: 1  2. Dizziness Comments: This may be related to missing 2 doses of xanax that she has taken daily for quite some time, orthostats are normal. If not better in 1-2 weeks RTC to office  3. Diabetes mellitus type 2 in obese ([place]) Comments: Continue Ozempic.  - Ambulatory referral to Ophthalmology - blood glucose meter kit and supplies KIT; Dispense based on patient and insurance preference. Use up to four times daily as directed.  Dispense: 1 each; Refill: 0 - Glucagon (GVOKE HYPOPEN 2-PACK) 0.5 MG/0.1ML SOAJ; Inject 0.1 mg into the skin as needed.  Dispense: 0.1 mL; Refill: 6  4. Class 1 obesity due to excess calories with serious comorbidity and body mass index (BMI) of 30.0 to 30.9 in adult Comments: Referral was placed for PREP, will f/u to see if they have called She is encouraged to strive for BMI less than 30 to decrease cardiac risk. Advised to aim for at least 150 minutes of exercise per week.    Patient was given opportunity to ask questions. Patient verbalized understanding of the plan and was able to repeat key elements of the plan. All questions were answered to their satisfaction.  [name], FNP   I, [name], FNP, have reviewed all documentation for this visit. The documentation on 12/18/21 for the exam, diagnosis, procedures, and orders are all accurate and complete.   IF YOU HAVE BEEN REFERRED TO A SPECIALIST, IT MAY TAKE 1-2 WEEKS TO SCHEDULE/PROCESS THE REFERRAL. IF YOU HAVE NOT HEARD FROM US/SPECIALIST IN TWO WEEKS, PLEASE GIVE [place] A CALL AT [phone] X [phone].   THE PATIENT IS ENCOURAGED TO PRACTICE SOCIAL DISTANCING DUE TO THE COVID-19 PANDEMIC.

## 2021-12-18 NOTE — Patient Instructions (Signed)

## 2021-12-19 ENCOUNTER — Other Ambulatory Visit: Payer: Self-pay | Admitting: Nurse Practitioner

## 2021-12-19 ENCOUNTER — Encounter: Payer: Self-pay | Admitting: Nurse Practitioner

## 2021-12-19 DIAGNOSIS — Z8679 Personal history of other diseases of the circulatory system: Secondary | ICD-10-CM

## 2021-12-19 DIAGNOSIS — I10 Essential (primary) hypertension: Secondary | ICD-10-CM

## 2021-12-19 MED ORDER — METOPROLOL TARTRATE 50 MG PO TABS: 50.0000 mg | ORAL_TABLET | Freq: Two times a day (BID) | ORAL | 1 refills | Status: DC

## 2021-12-20 ENCOUNTER — Telehealth: Payer: Self-pay

## 2021-12-20 ENCOUNTER — Other Ambulatory Visit: Payer: Self-pay

## 2021-12-20 NOTE — Telephone Encounter (Signed)
Called to discuss PREP program, left voicemail ? ?

## 2021-12-20 NOTE — Telephone Encounter (Signed)
She returned my call, explained PREP, she needs evening class; will ask Pam RN HC to contact her with next evening class schedule this fall at [facility]

## 2021-12-23 ENCOUNTER — Other Ambulatory Visit: Payer: BC Managed Care – PPO

## 2021-12-23 ENCOUNTER — Encounter: Payer: Self-pay | Admitting: Nurse Practitioner

## 2021-12-24 ENCOUNTER — Encounter: Payer: Self-pay | Admitting: Nurse Practitioner

## 2021-12-25 ENCOUNTER — Encounter: Payer: Self-pay | Admitting: Internal Medicine

## 2021-12-25 ENCOUNTER — Other Ambulatory Visit (INDEPENDENT_AMBULATORY_CARE_PROVIDER_SITE_OTHER): Payer: BC Managed Care – PPO

## 2021-12-25 DIAGNOSIS — E039 Hypothyroidism, unspecified: Secondary | ICD-10-CM

## 2021-12-25 LAB — TSH: TSH: 0.27 u[IU]/mL — ABNORMAL LOW (ref 0.35–5.50)

## 2021-12-25 LAB — T4, FREE: Free T4: 1.57 ng/dL (ref 0.60–1.60)

## 2021-12-30 ENCOUNTER — Encounter: Payer: Self-pay | Admitting: Nurse Practitioner

## 2021-12-31 ENCOUNTER — Other Ambulatory Visit: Payer: BC Managed Care – PPO

## 2022-01-01 MED ORDER — LEVOTHYROXINE SODIUM 125 MCG PO TABS: 125.0000 ug | ORAL_TABLET | Freq: Every day | ORAL | 3 refills | Status: DC

## 2022-01-02 ENCOUNTER — Telehealth: Payer: Self-pay

## 2022-01-02 NOTE — Telephone Encounter (Signed)
Call from pt requesting more info on PREP Explained program to pt.  Interested. Will call her for the daytime class starting in Sept but needs to check with work.  Otherwise would like to do evening class tentatively starting 10/17.  Will call her either way

## 2022-01-09 ENCOUNTER — Other Ambulatory Visit: Payer: Self-pay | Admitting: Nurse Practitioner

## 2022-01-09 DIAGNOSIS — Z8679 Personal history of other diseases of the circulatory system: Secondary | ICD-10-CM

## 2022-01-09 MED ORDER — METOPROLOL TARTRATE 25 MG PO TABS: 25.0000 mg | ORAL_TABLET | Freq: Every day | ORAL | 0 refills | Status: DC

## 2022-01-15 ENCOUNTER — Encounter: Payer: Self-pay | Admitting: Nurse Practitioner

## 2022-01-16 ENCOUNTER — Other Ambulatory Visit: Payer: Self-pay | Admitting: Nurse Practitioner

## 2022-01-16 DIAGNOSIS — Z8679 Personal history of other diseases of the circulatory system: Secondary | ICD-10-CM

## 2022-01-16 MED ORDER — METOPROLOL TARTRATE 25 MG PO TABS: 25.0000 mg | ORAL_TABLET | Freq: Two times a day (BID) | ORAL | 0 refills | Status: DC

## 2022-01-26 ENCOUNTER — Encounter: Payer: Self-pay | Admitting: Nurse Practitioner

## 2022-01-27 ENCOUNTER — Telehealth: Payer: Self-pay

## 2022-01-27 ENCOUNTER — Other Ambulatory Visit: Payer: Self-pay | Admitting: Internal Medicine

## 2022-01-27 MED ORDER — AMLODIPINE BESYLATE 2.5 MG PO TABS: 2.5000 mg | ORAL_TABLET | Freq: Every day | ORAL | 11 refills | Status: DC

## 2022-01-27 NOTE — Telephone Encounter (Signed)
Left message requesting call back reference PREP class starting

## 2022-01-27 NOTE — Progress Notes (Signed)
Pt called answering service w/ elevated BP readings.  178/107. She denies headaches, chest pain, sob and/or palpitations. Advised to go to ER if sx develop. She states she has brought in her home BP cuff to office to have readings validated. She admits she is stressed about returning to work next week. Dietary modifications discussed.   Med list reviewed. Recent chart notes reviewed. She has upcoming appt w/ HTN clinic. I will send amlodipine 2.'5mg'$  nightly to pharmacy.  I will have someone call her tomorrow to schedule a nurse visit next week.   All questions were answered to her satisfaction. Phone call lasted 11 minutes.   RS

## 2022-01-30 ENCOUNTER — Encounter: Payer: Self-pay | Admitting: Nurse Practitioner

## 2022-02-04 ENCOUNTER — Ambulatory Visit: Payer: BC Managed Care – PPO

## 2022-02-05 ENCOUNTER — Encounter: Payer: Self-pay | Admitting: Nurse Practitioner

## 2022-02-11 ENCOUNTER — Encounter: Payer: Self-pay | Admitting: Nurse Practitioner

## 2022-02-11 ENCOUNTER — Ambulatory Visit: Payer: BC Managed Care – PPO

## 2022-02-11 ENCOUNTER — Telehealth: Payer: Self-pay

## 2022-02-11 ENCOUNTER — Ambulatory Visit (INDEPENDENT_AMBULATORY_CARE_PROVIDER_SITE_OTHER): Payer: BC Managed Care – PPO | Admitting: Nurse Practitioner

## 2022-02-11 VITALS — BP 130/88 | HR 60 | Temp 97.9°F

## 2022-02-11 DIAGNOSIS — G4709 Other insomnia: Secondary | ICD-10-CM | POA: Diagnosis not present

## 2022-02-11 DIAGNOSIS — Z23 Encounter for immunization: Secondary | ICD-10-CM

## 2022-02-11 DIAGNOSIS — E782 Mixed hyperlipidemia: Secondary | ICD-10-CM | POA: Diagnosis not present

## 2022-02-11 DIAGNOSIS — I10 Essential (primary) hypertension: Secondary | ICD-10-CM | POA: Diagnosis not present

## 2022-02-11 DIAGNOSIS — G4485 Primary stabbing headache: Secondary | ICD-10-CM

## 2022-02-11 DIAGNOSIS — R7303 Prediabetes: Secondary | ICD-10-CM | POA: Diagnosis not present

## 2022-02-11 DIAGNOSIS — Z8679 Personal history of other diseases of the circulatory system: Secondary | ICD-10-CM

## 2022-02-11 NOTE — Telephone Encounter (Signed)
Received vmf pt reference PREP class  Called pt back. Offered for her to join the 9/25 class or start in Oct daytimes. Sts will need an evening class afterall.  Oct 17 T/TH 6p-715p will be the next and will be a large class.  Will call her for an appt prior to start of class

## 2022-02-11 NOTE — Progress Notes (Signed)
I,Amanda Henry,acting as a Education administrator for [employer], FNP.,have documented all relevant documentation on the behalf of Amanda Brine, FNP,as directed by  Amanda Brine, FNP while in the presence of Amanda Henry, [place].  Subjective:     Patient ID: Amanda Henry , female    DOB: 15-Dec-1965 , 56 y.o.   MRN: [id number]   Chief Complaint  Patient presents with   Hypertension    HPI  Patient presents today for bpc. She is currently taking amlodipine 2.75m, metoprolol 244mand olmesartan 2045mShe also started magnesium 2 weeks ago. She is due to see Cardiology in October. She is having an unusual hunger. She is walking 3-4 days a week for 2-3 miles each time.   BP Readings from Last 3 Encounters: 02/11/22 : 130/88 12/05/21 : 109/81 11/05/21 : 128/78  Blood pressure readings at home ranging 152/94 her blood pressure is as high as 222/110. She will start the exercise program in October. She is to go back to work next week. She had her levothyroxine decreased and to have her thyroid levels repeated.   Hypertension Associated symptoms include blurred vision (intermittent - seperate from her headaches) and headaches (sharp pains mostly at night on the right side, will take tylenol.). Pertinent negatives include no anxiety or chest pain. (At night she is hearing piercing sounds in her ear about one week ago. ) Risk factors for coronary artery disease include obesity.     Past Medical History:  Diagnosis Date   Anxiety    Blood transfusion without reported diagnosis    Depression    Hypertension    no medications at this time   Hypothyroidism    Iron deficiency anemia    Thyroid disease      Family History  Problem Relation Age of Onset   Thyroid disease Mother    Hypertension Mother    Diabetes Father    Hypertension Other    Diabetes Other    Thyroid disease Other    Heart disease Other      Current Outpatient Medications:    MAGNESIUM GLYCINATE PO, Take 500 mg by mouth.,  Disp: , Rfl:    ALPRAZolam (XANAX XR) 1 MG 24 hr tablet, Take 0.5 mg by mouth daily as needed for sleep., Disp: , Rfl:    amLODipine (NORVASC) 2.5 MG tablet, Take 1 tablet (2.5 mg total) by mouth daily., Disp: 30 tablet, Rfl: 11   blood glucose meter kit and supplies KIT, Dispense based on patient and insurance preference. Use up to four times daily as directed., Disp: 1 each, Rfl: 0   escitalopram (LEXAPRO) 10 MG tablet, Take 1 tablet (10 mg total) by mouth daily., Disp: 30 tablet, Rfl: 1   Glucagon (GVOKE HYPOPEN 2-PACK) 0.5 MG/0.1ML SOAJ, Inject 0.1 mg into the skin as needed., Disp: 0.1 mL, Rfl: 6   ipratropium (ATROVENT) 0.06 % nasal spray, Place 2 sprays into the nose 3 (three) times daily. (Patient not taking: Reported on 12/05/2021), Disp: 15 mL, Rfl: 2   levothyroxine (SYNTHROID) 125 MCG tablet, Take 1 tablet (125 mcg total) by mouth daily., Disp: 45 tablet, Rfl: 3   meclizine (ANTIVERT) 12.5 MG tablet, Take 1 tablet (12.5 mg total) by mouth 3 (three) times daily as needed for dizziness., Disp: 30 tablet, Rfl: 1   metoprolol tartrate (LOPRESSOR) 25 MG tablet, Take 1 tablet (25 mg total) by mouth 2 (two) times daily., Disp: 180 tablet, Rfl: 0   olmesartan (BENICAR) 20 MG tablet, Take 1 tablet (20  mg total) by mouth daily., Disp: 90 tablet, Rfl: 1   Semaglutide,0.25 or 0.5MG/DOS, (OZEMPIC, 0.25 OR 0.5 MG/DOSE,) 2 MG/3ML SOPN, Inject 0.5 mg into the skin once a week. (Patient taking differently: Inject 0.5 mg into the skin once a week. Tuesday), Disp: 9 mL, Rfl: 1   Vitamin D, Ergocalciferol, (DRISDOL) 1.25 MG (50000 UNIT) CAPS capsule, Take 1 capsule (50,000 Units total) by mouth every 7 (seven) days. (Patient taking differently: Take 50,000 Units by mouth every 7 (seven) days. Wednesday), Disp: 12 capsule, Rfl: 1   Allergies  Allergen Reactions   Latex Rash   Penicillins Rash    Has patient had a PCN reaction causing immediate rash, facial/tongue/throat swelling, SOB or lightheadedness with  hypotension: Yes Has patient had a PCN reaction causing severe rash involving mucus membranes or skin necrosis: No Has patient had a PCN reaction that required hospitalization No Has patient had a PCN reaction occurring within the last 10 years: No If all of the above answers are "NO", then may proceed with Cephalosporin use.      Review of Systems  Eyes:  Positive for blurred vision (intermittent - seperate from her headaches).  Cardiovascular:  Negative for chest pain and leg swelling.  Neurological:  Positive for headaches (sharp pains mostly at night on the right side, will take tylenol.).  Psychiatric/Behavioral:  Positive for sleep disturbance (unable to stay asleep. She has been seeing a Psychologist, educational for one month weekly now bi-weekly).      Today's Vitals   02/11/22 1458  BP: 130/88  Pulse: 60  Temp: 97.9 F (36.6 C)  TempSrc: Oral   There is no height or weight on file to calculate BMI.   Objective:  Physical Exam Vitals reviewed.  Constitutional:      General: She is not in acute distress.    Appearance: Normal appearance. She is obese.  Cardiovascular:     Rate and Rhythm: Normal rate and regular rhythm.     Pulses: Normal pulses.     Heart sounds: Normal heart sounds. No murmur heard. Pulmonary:     Effort: Pulmonary effort is normal. No respiratory distress.     Breath sounds: Normal breath sounds. No wheezing.  Skin:    General: Skin is warm and dry.     Capillary Refill: Capillary refill takes less than 2 seconds.  Neurological:     General: No focal deficit present.     Mental Status: She is alert and oriented to person, place, and time.     Cranial Nerves: No cranial nerve deficit.     Motor: No weakness.  Psychiatric:        Mood and Affect: Mood normal.        Behavior: Behavior normal.        Thought Content: Thought content normal.        Judgment: Judgment normal.         Assessment And Plan:     1. Uncontrolled  hypertension Comments: She continues to have elevated blood pressures with her home cuff, Will check renal ultrasound.  - US Renal; Future  2. Mixed hyperlipidemia Comments: diet controlled, continue focusing on low fat diet.  3. Prediabetes Comments: Stable, continue focusing on diet low in sugar and bad carbs. She starts PREP in October  4. Other insomnia  5. Need for influenza vaccination Influenza vaccine administered Encouraged to take Tylenol as needed for fever or muscle aches - Flu Vaccine QUAD 6+ mos PF IM (Fluarix  Quad PF)  6. Primary stabbing headache Comments: Will check CT scan of head to ensure no new aneurysm. This may also be a new onset migraines.   7. History of subarachnoid hemorrhage - CT HEAD WO CONTRAST (5MM); Future     Patient was given opportunity to ask questions. Patient verbalized understanding of the plan and was able to repeat key elements of the plan. All questions were answered to their satisfaction.  Amanda Brine, FNP   I, Amanda Brine, FNP, have reviewed all documentation for this visit. The documentation on 02/11/22 for the exam, diagnosis, procedures, and orders are all accurate and complete.   IF YOU HAVE BEEN REFERRED TO A SPECIALIST, IT MAY TAKE 1-2 WEEKS TO SCHEDULE/PROCESS THE REFERRAL. IF YOU HAVE NOT HEARD FROM US/SPECIALIST IN TWO WEEKS, PLEASE GIVE [place] A CALL AT [phone].   THE PATIENT IS ENCOURAGED TO PRACTICE SOCIAL DISTANCING DUE TO THE COVID-19 PANDEMIC.

## 2022-02-11 NOTE — Patient Instructions (Signed)
Hypertension, Adult High blood pressure (hypertension) is when the force of blood pumping through the arteries is too strong. The arteries are the blood vessels that carry blood from the heart throughout the body. Hypertension forces the heart to work harder to pump blood and may cause arteries to become narrow or stiff. Untreated or uncontrolled hypertension can lead to a heart attack, heart failure, a stroke, kidney disease, and other problems. A blood pressure reading consists of a higher number over a lower number. Ideally, your blood pressure should be below 120/80. The first ("top") number is called the systolic pressure. It is a measure of the pressure in your arteries as your heart beats. The second ("bottom") number is called the diastolic pressure. It is a measure of the pressure in your arteries as the heart relaxes. What are the causes? The exact cause of this condition is not known. There are some conditions that result in high blood pressure. What increases the risk? Certain factors may make you more likely to develop high blood pressure. Some of these risk factors are under your control, including: Smoking. Not getting enough exercise or physical activity. Being overweight. Having too much fat, sugar, calories, or salt (sodium) in your diet. Drinking too much alcohol. Other risk factors include: Having a personal history of heart disease, diabetes, high cholesterol, or kidney disease. Stress. Having a family history of high blood pressure and high cholesterol. Having obstructive sleep apnea. Age. The risk increases with age. What are the signs or symptoms? High blood pressure may not cause symptoms. Very high blood pressure (hypertensive crisis) may cause: Headache. Fast or irregular heartbeats (palpitations). Shortness of breath. Nosebleed. Nausea and vomiting. Vision changes. Severe chest pain, dizziness, and seizures. How is this diagnosed? This condition is diagnosed by  measuring your blood pressure while you are seated, with your arm resting on a flat surface, your legs uncrossed, and your feet flat on the floor. The cuff of the blood pressure monitor will be placed directly against the skin of your upper arm at the level of your heart. Blood pressure should be measured at least twice using the same arm. Certain conditions can cause a difference in blood pressure between your right and left arms. If you have a high blood pressure reading during one visit or you have normal blood pressure with other risk factors, you may be asked to: Return on a different day to have your blood pressure checked again. Monitor your blood pressure at home for 1 week or longer. If you are diagnosed with hypertension, you may have other blood or imaging tests to help your health care provider understand your overall risk for other conditions. How is this treated? This condition is treated by making healthy lifestyle changes, such as eating healthy foods, exercising more, and reducing your alcohol intake. You may be referred for counseling on a healthy diet and physical activity. Your health care provider may prescribe medicine if lifestyle changes are not enough to get your blood pressure under control and if: Your systolic blood pressure is above 130. Your diastolic blood pressure is above 80. Your personal target blood pressure may vary depending on your medical conditions, your age, and other factors. Follow these instructions at home: Eating and drinking  Eat a diet that is high in fiber and potassium, and low in sodium, added sugar, and fat. An example of this eating plan is called the DASH diet. DASH stands for Dietary Approaches to Stop Hypertension. To eat this way: Eat   plenty of fresh fruits and vegetables. Try to fill one half of your plate at each meal with fruits and vegetables. Eat whole grains, such as whole-wheat pasta, brown rice, or whole-grain bread. Fill about one  fourth of your plate with whole grains. Eat or drink low-fat dairy products, such as skim milk or low-fat yogurt. Avoid fatty cuts of meat, processed or cured meats, and poultry with skin. Fill about one fourth of your plate with lean proteins, such as fish, chicken without skin, beans, eggs, or tofu. Avoid pre-made and processed foods. These tend to be higher in sodium, added sugar, and fat. Reduce your daily sodium intake. Many people with hypertension should eat less than 1,500 mg of sodium a day. Do not drink alcohol if: Your health care provider tells you not to drink. You are pregnant, may be pregnant, or are planning to become pregnant. If you drink alcohol: Limit how much you have to: 0-1 drink a day for women. 0-2 drinks a day for men. Know how much alcohol is in your drink. In the [place] one drink equals one 12 oz bottle of beer (355 mL), one 5 oz glass of wine (148 mL), or one 1 oz glass of hard liquor (44 mL). Lifestyle  Work with your health care provider to maintain a healthy body weight or to lose weight. Ask what an ideal weight is for you. Get at least 30 minutes of exercise that causes your heart to beat faster (aerobic exercise) most days of the week. Activities may include walking, swimming, or biking. Include exercise to strengthen your muscles (resistance exercise), such as Pilates or lifting weights, as part of your weekly exercise routine. Try to do these types of exercises for 30 minutes at least 3 days a week. Do not use any products that contain nicotine or tobacco. These products include cigarettes, chewing tobacco, and vaping devices, such as e-cigarettes. If you need help quitting, ask your health care provider. Monitor your blood pressure at home as told by your health care provider. Keep all follow-up visits. This is important. Medicines Take over-the-counter and prescription medicines only as told by your health care provider. Follow directions carefully. Blood  pressure medicines must be taken as prescribed. Do not skip doses of blood pressure medicine. Doing this puts you at risk for problems and can make the medicine less effective. Ask your health care provider about side effects or reactions to medicines that you should watch for. Contact a health care provider if you: Think you are having a reaction to a medicine you are taking. Have headaches that keep coming back (recurring). Feel dizzy. Have swelling in your ankles. Have trouble with your vision. Get help right away if you: Develop a severe headache or confusion. Have unusual weakness or numbness. Feel faint. Have severe pain in your chest or abdomen. Vomit repeatedly. Have trouble breathing. These symptoms may be an emergency. Get help right away. Call 911. Do not wait to see if the symptoms will go away. Do not drive yourself to the hospital. Summary Hypertension is when the force of blood pumping through your arteries is too strong. If this condition is not controlled, it may put you at risk for serious complications. Your personal target blood pressure may vary depending on your medical conditions, your age, and other factors. For most people, a normal blood pressure is less than 120/80. Hypertension is treated with lifestyle changes, medicines, or a combination of both. Lifestyle changes include losing weight, eating a healthy,   low-sodium diet, exercising more, and limiting alcohol. This information is not intended to replace advice given to you by your health care provider. Make sure you discuss any questions you have with your health care provider. Document Revised: 03/12/2021 Document Reviewed: 03/12/2021 Elsevier Patient Education  2023 Elsevier Inc.  

## 2022-02-12 ENCOUNTER — Ambulatory Visit
Admission: RE | Admit: 2022-02-12 | Discharge: 2022-02-12 | Disposition: A | Discharge status: Home / Self Care | Payer: BC Managed Care – PPO | Source: Ambulatory Visit | Attending: Nurse Practitioner | Admitting: Nurse Practitioner

## 2022-02-12 DIAGNOSIS — I10 Essential (primary) hypertension: Secondary | ICD-10-CM

## 2022-02-17 LAB — HM DIABETES EYE EXAM

## 2022-02-18 ENCOUNTER — Encounter (HOSPITAL_BASED_OUTPATIENT_CLINIC_OR_DEPARTMENT_OTHER): Payer: Self-pay | Admitting: Family

## 2022-02-18 NOTE — Progress Notes (Deleted)
[facility] Initial Assessment:    Date:  02/18/2022   ID:  Amanda Henry, DOB 10-07-1965, MRN [id number]  PCP:  [name], FNP  Cardiologist:  None  Nephrologist:  Referring MD: [name], FNP   CC: Hypertension  History of Present Illness:    Amanda Henry is a 56 y.o. female with a hx of hypertension, subarachnoid hemorrhage, palpitations, obesity, DM2, anxiety, hypothyroidism, vitamin D deficiency*** here to establish care in the [facility].   ED visit 10/11/21 hypertension with lightheadedness, nausea. CT head no acute findings. Provided PO Hydralazine in ED. 10/12/21 Olmesartan '20mg'$  daily initiated by PCP. Repeat ED visit 11/26/21 with hypertensive urgency provided PO clonidine in ED. 01/27/22 Amlodipine 2.'5mg'$  initiated by PCP.   Amanda Henry was diagnosed with hypertension ***. It has been {Blank single:19197::"easy","difficult","***"} to control. Blood pressure {Blank single:19197::"not checked routinely at home","checked with arm cuff at home","checked with wrist cuff at home","***"}. Readings have been ***. she reports tobacco use ***. Alcohol use ***. For exercise she ***. she eats {Blank multiple:19196::"***","at home","outside of the home"} and {Blank single:19197::"does","does not"} follow low sodium diet.   Previous antihypertensives: HCTZ - *** Valsartan ***  Secondary Causes of Hypertension  Medications/Herbal: OCP, steroids, stimulants, antidepressants, weight loss medication, immune suppressants, NSAIDs, sympathomimetics, alcohol, caffeine, licorice, ginseng, St. John's wort, chemo  Sleep Apnea Renal artery stenosis Hyperaldosteronism Hyper/hypothyroidism Pheochromocytoma: palpitations, tachycardia, headache, diaphoresis (plasma metanephrines) Cushing's syndrome: Cushingoid facies, central obesity, proximal muscle weakness, and ecchymoses, adrenal incidentaloma (cortisol) Coarctation of the aorta  Past Medical  History:  Diagnosis Date   Anxiety    Blood transfusion without reported diagnosis    Depression    DM2 (diabetes mellitus, type 2) ([place])    Hypertension    no medications at this time   Hypothyroidism    Iron deficiency anemia    Obesity    Palpitations    Thyroid disease    Vitamin D deficiency     Past Surgical History:  Procedure Laterality Date   IR ANGIO INTRA EXTRACRAN SEL COM CAROTID INNOMINATE UNI L MOD SED  03/16/2021   IR ANGIO INTRA EXTRACRAN SEL COM CAROTID INNOMINATE UNI L MOD SED  03/26/2021   IR ANGIO INTRA EXTRACRAN SEL INTERNAL CAROTID UNI R MOD SED  03/15/2021   IR ANGIO INTRA EXTRACRAN SEL INTERNAL CAROTID UNI R MOD SED  03/26/2021   IR ANGIO INTRA EXTRACRAN SEL INTERNAL CAROTID UNI R MOD SED  03/25/2021   IR ANGIO INTRA EXTRACRAN SEL INTERNAL CAROTID UNI R MOD SED  10/29/2021   IR ANGIO VERTEBRAL SEL VERTEBRAL UNI L MOD SED  03/15/2021   IR ANGIO VERTEBRAL SEL VERTEBRAL UNI L MOD SED  03/26/2021   IR ANGIOGRAM FOLLOW UP STUDY  03/26/2021   IR ANGIOGRAM FOLLOW UP STUDY  03/26/2021   IR TRANSCATH/EMBOLIZ  03/26/2021   IR US GUIDE VASC ACCESS RIGHT  03/26/2021   IR US GUIDE VASC ACCESS RIGHT  10/29/2021   LAPAROSCOPIC VAGINAL HYSTERECTOMY WITH SALPINGECTOMY Bilateral 10/01/2016   Procedure: LAPAROSCOPIC ASSISTED VAGINAL HYSTERECTOMY WITH SALPINGECTOMY;  Surgeon: Paula Compton, MD;  Location: [facility];  Service: Gynecology;  Laterality: Bilateral;   PARATHYROIDECTOMY Right 04/05/2021   Procedure: right inferioir PARATHYROIDECTOMY;  Surgeon: Armandina Gemma, MD;  Location: [facility];  Service: General;  Laterality: Right;   RADIOLOGY WITH ANESTHESIA N/A 03/15/2021   Procedure: IR WITH ANESTHESIA;  Surgeon: Consuella Lose, MD;  Location: [facility];  Service: Radiology;  Laterality: N/A;   RADIOLOGY WITH ANESTHESIA N/A 03/26/2021  Procedure: IR WITH ANESTHESIA (Coiling and Embolization);  Surgeon: Consuella Lose, MD;  Location: [facility];  Service: Radiology;  Laterality: N/A;    WISDOM TOOTH EXTRACTION      Current Medications: No outpatient medications have been marked as taking for the 02/20/22 encounter (Appointment) with Loel Dubonnet, NP.     Allergies:   Latex and Penicillins   Social History   Socioeconomic History   Marital status: Single    Spouse name: Not on file   Number of children: 1   Years of education: Not on file   Highest education level: Not on file  Occupational History   Occupation: Financial planner  Tobacco Use   Smoking status: Never   Smokeless tobacco: Never  Substance and Sexual Activity   Alcohol use: No   Drug use: No   Sexual activity: Yes    Birth control/protection: Condom  Other Topics Concern   Not on file  Social History Narrative   Not on file   Social Determinants of Health   Financial Resource Strain: Not on file  Food Insecurity: Not on file  Transportation Needs: Not on file  Physical Activity: Not on file  Stress: Not on file  Social Connections: Not on file     Family History: The patient's ***family history includes Diabetes in her father and another family member; Heart disease in an other family member; Hypertension in her mother and another family member; Thyroid disease in her mother and another family member.  ROS:   Please see the history of present illness.    *** All other systems reviewed and are negative.  EKGs/Labs/Other Studies Reviewed:    EKG:  EKG is ordered today.  The ekg ordered today demonstrates ***  Recent Labs: 04/08/2021: Magnesium 1.7 12/04/2021: ALT 16; BUN 15; Creatinine, Ser 0.89; Hemoglobin 14.5; Platelets 332; Potassium 3.9; Sodium 138 12/25/2021: TSH 0.27   Recent Lipid Panel    Component Value Date/Time   CHOL 237 (H) 08/26/2021 1717   TRIG 177 (H) 08/26/2021 1717   HDL 55 08/26/2021 1717   CHOLHDL 4.3 08/26/2021 1717   LDLCALC 150 (H) 08/26/2021 1717    Physical Exam:   VS:  LMP 08/28/2016 (Exact Date) Comment: come back on 09/15/2016 , BMI  There is no height or weight on file to calculate BMI. GENERAL:  Well appearing HEENT: Pupils equal round and reactive, fundi not visualized, oral mucosa unremarkable NECK:  No jugular venous distention, waveform within normal limits, carotid upstroke brisk and symmetric, no bruits, no thyromegaly LYMPHATICS:  No cervical adenopathy LUNGS:  Clear to auscultation bilaterally HEART:  RRR.  PMI not displaced or sustained,S1 and S2 within normal limits, no S3, no S4, no clicks, no rubs, *** murmurs ABD:  Flat, positive bowel sounds normal in frequency in pitch, no bruits, no rebound, no guarding, no midline pulsatile mass, no hepatomegaly, no splenomegaly EXT:  2 plus pulses throughout, no edema, no cyanosis no clubbing SKIN:  No rashes no nodules NEURO:  Cranial nerves II through XII grossly intact, motor grossly intact throughout PSYCH:  Cognitively intact, oriented to person place and time   ASSESSMENT/PLAN:    HTN -   Palpitations -   Anxiety -   Screening for Secondary Hypertension: { Click here to document screening for secondary causes of HTN  :[id number]}    Relevant Labs/Studies:    Latest Ref Rng & Units 12/04/2021    9:34 PM 10/29/2021    8:40 AM 10/12/2021  12:00 AM  Basic Labs  Sodium 135 - 145 mmol/L 138  136  139   Potassium 3.5 - 5.1 mmol/L 3.9  3.4  3.6   Creatinine 0.44 - 1.00 mg/dL 0.89  0.79  0.80        Latest Ref Rng & Units 12/25/2021    8:31 AM 12/04/2021    9:34 PM  Thyroid   TSH 0.35 - 5.50 uIU/mL 0.27  0.350                     she consents to be monitored in our remote patient monitoring program through [place].  she will track his blood pressure twice daily and understands that these trends will help [place] to adjust her medications as needed prior to his next appointment.  she *** interested in enrolling in the PREP exercise and nutrition program through the [facility].     Disposition:    FU with MD/PharmD in {gen number [id number]} {Days to  years:10300}    Medication Adjustments/Labs and Tests Ordered: Current medicines are reviewed at length with the patient today.  Concerns regarding medicines are outlined above.  No orders of the defined types were placed in this encounter.  No orders of the defined types were placed in this encounter.    Signed, Loel Dubonnet, NP  02/18/2022 6:25 PM    [facility]

## 2022-02-19 ENCOUNTER — Ambulatory Visit (INDEPENDENT_AMBULATORY_CARE_PROVIDER_SITE_OTHER): Payer: BC Managed Care – PPO | Admitting: Nurse Practitioner

## 2022-02-19 ENCOUNTER — Encounter: Payer: Self-pay | Admitting: Nurse Practitioner

## 2022-02-19 VITALS — BP 118/72 | HR 64 | Temp 98.0°F | Ht 67.0 in | Wt 201.6 lb

## 2022-02-19 DIAGNOSIS — Z1231 Encounter for screening mammogram for malignant neoplasm of breast: Secondary | ICD-10-CM

## 2022-02-19 DIAGNOSIS — Z0000 Encounter for general adult medical examination without abnormal findings: Secondary | ICD-10-CM | POA: Diagnosis not present

## 2022-02-19 DIAGNOSIS — E782 Mixed hyperlipidemia: Secondary | ICD-10-CM | POA: Diagnosis not present

## 2022-02-19 DIAGNOSIS — E1165 Type 2 diabetes mellitus with hyperglycemia: Secondary | ICD-10-CM | POA: Diagnosis not present

## 2022-02-19 DIAGNOSIS — I10 Essential (primary) hypertension: Secondary | ICD-10-CM

## 2022-02-19 DIAGNOSIS — E039 Hypothyroidism, unspecified: Secondary | ICD-10-CM

## 2022-02-19 DIAGNOSIS — Z79899 Other long term (current) drug therapy: Secondary | ICD-10-CM

## 2022-02-19 LAB — POCT URINALYSIS DIPSTICK
Bilirubin, UA: NEGATIVE
Glucose, UA: NEGATIVE
Ketones, UA: NEGATIVE
Leukocytes, UA: NEGATIVE
Nitrite, UA: NEGATIVE
Protein, UA: NEGATIVE
Spec Grav, UA: 1.01 (ref 1.010–1.025)
Urobilinogen, UA: 0.2 E.U./dL
pH, UA: 6.5 (ref 5.0–8.0)

## 2022-02-19 NOTE — Patient Instructions (Signed)

## 2022-02-19 NOTE — Progress Notes (Signed)
Barnet Glasgow Martin,acting as a Education administrator for [name], FNP.,have documented all relevant documentation on the behalf of [name], FNP,as directed by  [name], FNP while in the presence of [name], Index.   Subjective:     Patient ID: Amanda Henry , female    DOB: 1966-01-06 , 56 y.o.   MRN: [id number]   Chief Complaint  Patient presents with   Annual Exam    HPI  Patient presents today for HM, Patient states compliance with medications and doesn't have any other issues today. 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 She is not doing 1/2 day. She may have to go to another class. She is going to Cardiology tomorrow.   BP Readings from Last 3 Encounters: 02/19/22 : (!) 130/90 02/11/22 : 130/88 12/05/21 : 109/81       Past Medical History:  Diagnosis Date   Anxiety    Blood transfusion without reported diagnosis    Depression    DM2 (diabetes mellitus, type 2) ([place])    Hypertension    no medications at this time   Hypothyroidism    Iron deficiency anemia    Obesity    Palpitations    Subarachnoid hemorrhage (HCC)    Thyroid disease    Vitamin D deficiency      Family History  Problem Relation Age of Onset   Thyroid disease Mother    Hypertension Mother    Diabetes Father    Hypertension Other    Diabetes Other    Thyroid disease Other    Heart disease Other      Current Outpatient Medications:    ALPRAZolam (XANAX XR) 1 MG 24 hr tablet, Take 0.5 mg by mouth daily as needed for sleep., Disp: , Rfl:    amLODipine (NORVASC) 2.5 MG tablet, Take 1 tablet (2.5 mg total) by mouth daily., Disp: 30 tablet, Rfl: 11   blood glucose meter kit and supplies KIT, Dispense based on patient and insurance preference. Use up to four times daily as directed., Disp: 1 each, Rfl: 0   escitalopram (LEXAPRO) 10 MG tablet, Take 1 tablet (10 mg total) by mouth daily., Disp: 30 tablet, Rfl: 1   Glucagon (GVOKE HYPOPEN 2-PACK) 0.5 MG/0.1ML SOAJ, Inject  0.1 mg into the skin as needed., Disp: 0.1 mL, Rfl: 6   ipratropium (ATROVENT) 0.06 % nasal spray, Place 2 sprays into the nose 3 (three) times daily. (Patient not taking: Reported on 12/05/2021), Disp: 15 mL, Rfl: 2   levothyroxine (SYNTHROID) 125 MCG tablet, Take 1 tablet (125 mcg total) by mouth daily., Disp: 45 tablet, Rfl: 3   MAGNESIUM GLYCINATE PO, Take 500 mg by mouth., Disp: , Rfl:    meclizine (ANTIVERT) 12.5 MG tablet, Take 1 tablet (12.5 mg total) by mouth 3 (three) times daily as needed for dizziness., Disp: 30 tablet, Rfl: 1   metoprolol tartrate (LOPRESSOR) 25 MG tablet, Take 1 tablet (25 mg total) by mouth 2 (two) times daily., Disp: 180 tablet, Rfl: 0   olmesartan (BENICAR) 20 MG tablet, Take 1 tablet (20 mg total) by mouth daily., Disp: 90 tablet, Rfl: 1   Semaglutide,0.25 or 0.5MG/DOS, (OZEMPIC, 0.25 OR 0.5 MG/DOSE,) 2 MG/3ML SOPN, Inject 0.5 mg into the skin once a week., Disp: 9 mL, Rfl: 1   Vitamin D, Ergocalciferol, (DRISDOL) 1.25 MG (50000 UNIT) CAPS capsule, Take 1 capsule (50,000 Units total) by mouth every 7 (seven) days. (Patient taking differently: Take 50,000 Units by  mouth every 7 (seven) days. Wednesday), Disp: 12 capsule, Rfl: 1   Allergies  Allergen Reactions   Latex Rash   Penicillins Rash    Has patient had a PCN reaction causing immediate rash, facial/tongue/throat swelling, SOB or lightheadedness with hypotension: Yes Has patient had a PCN reaction causing severe rash involving mucus membranes or skin necrosis: No Has patient had a PCN reaction that required hospitalization No Has patient had a PCN reaction occurring within the last 10 years: No If all of the above answers are "NO", then may proceed with Cephalosporin use.       The patient states she uses status post hysterectomy.  Negative for: breast discharge, breast lump(s), breast pain and breast self exam. Associated symptoms include abnormal vaginal bleeding. Pertinent negatives include abnormal  bleeding (hematology), anxiety, decreased libido, depression, difficulty falling sleep, dyspareunia, history of infertility, nocturia, sexual dysfunction, sleep disturbances, urinary incontinence, urinary urgency, vaginal discharge and vaginal itching. Diet regular; has been going well. She will have a dessert every now and then. She has been eating more carbs. She has been eating fish and spinach. She is cooking more. She is feeling hungry.  The patient states her exercise level is walking at least 5 days a week and starts at the [facility] October 17th.  The patient's tobacco use is:  Social History   Tobacco Use  Smoking Status Never  Smokeless Tobacco Never  . She has been exposed to passive smoke. The patient's alcohol use is:  Social History   Substance and Sexual Activity  Alcohol Use No      Review of Systems  Constitutional: Negative.   HENT: Negative.    Eyes: Negative.   Respiratory: Negative.    Cardiovascular: Negative.   Gastrointestinal: Negative.   Endocrine: Negative.   Genitourinary: Negative.   Musculoskeletal: Negative.   Skin: Negative.   Allergic/Immunologic: Negative.   Neurological: Negative.   Hematological: Negative.   Psychiatric/Behavioral: Negative.       Today's Vitals   02/19/22 1512 02/19/22 1637  BP: (!) 130/90 118/72  Pulse: 64   Temp: 98 F (36.7 C)   TempSrc: Oral   Weight: 201 lb 9.6 oz (91.4 kg)   Height: 5' 7" (1.702 m)    Body mass index is 31.58 kg/m.   Objective:  Physical Exam Vitals reviewed.  Constitutional:      General: She is not in acute distress.    Appearance: Normal appearance. She is well-developed. She is obese.  HENT:     Head: Normocephalic and atraumatic.     Right Ear: Hearing, tympanic membrane, ear canal and external ear normal.     Left Ear: Hearing, tympanic membrane, ear canal and external ear normal.     Nose: Nose normal.     Mouth/Throat:     Mouth: Mucous membranes are moist.  Eyes:     General:  Lids are normal.     Conjunctiva/sclera: Conjunctivae normal.     Pupils: Pupils are equal, round, and reactive to light.     Funduscopic exam:    Right eye: No papilledema.        Left eye: No papilledema.  Neck:     Thyroid: No thyroid mass.     Vascular: No carotid bruit.  Cardiovascular:     Rate and Rhythm: Normal rate and regular rhythm.     Pulses: Normal pulses.     Heart sounds: Normal heart sounds. No murmur heard. Pulmonary:     Effort:  Pulmonary effort is normal. No respiratory distress.     Breath sounds: Normal breath sounds.  Abdominal:     General: Abdomen is flat. Bowel sounds are normal.     Palpations: Abdomen is soft.  Musculoskeletal:        General: No swelling. Normal range of motion.     Cervical back: Full passive range of motion without pain, normal range of motion and neck supple.     Right lower leg: No edema.     Left lower leg: No edema.  Skin:    General: Skin is warm and dry.     Capillary Refill: Capillary refill takes less than 2 seconds.  Neurological:     General: No focal deficit present.     Mental Status: She is alert and oriented to person, place, and time.     Cranial Nerves: No cranial nerve deficit.     Sensory: No sensory deficit.  Psychiatric:        Mood and Affect: Mood normal.        Behavior: Behavior normal.        Thought Content: Thought content normal.        Judgment: Judgment normal.         Assessment And Plan:     1. Annual physical exam Behavior modifications discussed and diet history reviewed.   Pt will continue to exercise regularly and modify diet with low GI, plant based foods and decrease intake of processed foods.  Recommend intake of daily multivitamin, Vitamin D, and calcium.  Recommend mammogram and colonoscopy for preventive screenings, as well as recommend immunizations that include influenza, TDAP, and Shingles - CBC no Diff  2. Uncontrolled type 2 diabetes mellitus with hyperglycemia  ([place]) Comments: She is tolerating Ozempic well, continue focusing on healthy diet low in sugar and starches. Diabetic foot exam - Microalbumin / Creatinine Urine Ratio - POCT Urinalysis Dipstick (81002) - CMP14+EGFR - Hemoglobin A1c - Lipid panel  3. Uncontrolled hypertension Comments: Blood pressure is better controlled, continue with current medications - Microalbumin / Creatinine Urine Ratio - POCT Urinalysis Dipstick (81002)  4. Mixed hyperlipidemia Comments: Cholesterol levels are stable, continue low fat diet.  - Lipid panel  5. Acquired hypothyroidism Comments: Thyroid levels have been stable. Continue current medications and follow up with Endocrinology - TSH + free T4  6. Encounter for screening mammogram for malignant neoplasm of breast Pt instructed on Self Breast Exam.According to ACOG guidelines Women aged 110 and older are recommended to get an annual mammogram. Form completed and given to patient contact the [facility] for appointment scheduing.  Pt encouraged to get annual mammogram - MM DIGITAL SCREENING BILATERAL; Future  7. Other long term (current) drug therapy - CBC no Diff   Patient was given opportunity to ask questions. Patient verbalized understanding of the plan and was able to repeat key elements of the plan. All questions were answered to their satisfaction.   [name], FNP   I, [name], FNP, have reviewed all documentation for this visit. The documentation on 02/19/22 for the exam, diagnosis, procedures, and orders are all accurate and complete.   THE PATIENT IS ENCOURAGED TO PRACTICE SOCIAL DISTANCING DUE TO THE COVID-19 PANDEMIC.

## 2022-02-20 ENCOUNTER — Ambulatory Visit (HOSPITAL_BASED_OUTPATIENT_CLINIC_OR_DEPARTMENT_OTHER): Payer: BC Managed Care – PPO | Admitting: Family

## 2022-02-20 LAB — CMP14+EGFR
ALT: 19 IU/L (ref 0–32)
AST: 20 IU/L (ref 0–40)
Albumin/Globulin Ratio: 1.3 (ref 1.2–2.2)
Albumin: 4.4 g/dL (ref 3.8–4.9)
Alkaline Phosphatase: 107 IU/L (ref 44–121)
BUN/Creatinine Ratio: 20 (ref 9–23)
BUN: 18 mg/dL (ref 6–24)
Bilirubin Total: 0.3 mg/dL (ref 0.0–1.2)
CO2: 25 mmol/L (ref 20–29)
Calcium: 9.4 mg/dL (ref 8.7–10.2)
Chloride: 98 mmol/L (ref 96–106)
Creatinine, Ser: 0.9 mg/dL (ref 0.57–1.00)
Globulin, Total: 3.5 g/dL (ref 1.5–4.5)
Glucose: 78 mg/dL (ref 70–99)
Potassium: 4.3 mmol/L (ref 3.5–5.2)
Sodium: 139 mmol/L (ref 134–144)
Total Protein: 7.9 g/dL (ref 6.0–8.5)
eGFR: 76 mL/min/{1.73_m2} (ref >59)

## 2022-02-20 LAB — CBC
Hematocrit: 41.7 % (ref 34.0–46.6)
Hemoglobin: 13.7 g/dL (ref 11.1–15.9)
MCH: 27.8 pg (ref 26.6–33.0)
MCHC: 32.9 g/dL (ref 31.5–35.7)
MCV: 85 fL (ref 79–97)
Platelets: 315 10*3/uL (ref 150–450)
RBC: 4.92 x10E6/uL (ref 3.77–5.28)
RDW: 13.6 % (ref 11.7–15.4)
WBC: 7.2 10*3/uL (ref 3.4–10.8)

## 2022-02-20 LAB — LIPID PANEL
Chol/HDL Ratio: 4.1 ratio (ref 0.0–4.4)
Cholesterol, Total: 212 mg/dL — ABNORMAL HIGH (ref 100–199)
HDL: 52 mg/dL (ref >39)
LDL Chol Calc (NIH): 139 mg/dL — ABNORMAL HIGH (ref 0–99)
Triglycerides: 115 mg/dL (ref 0–149)
VLDL Cholesterol Cal: 21 mg/dL (ref 5–40)

## 2022-02-20 LAB — HEMOGLOBIN A1C
Est. average glucose Bld gHb Est-mCnc: 140 mg/dL
Hgb A1c MFr Bld: 6.5 % — ABNORMAL HIGH (ref 4.8–5.6)

## 2022-02-20 LAB — MICROALBUMIN / CREATININE URINE RATIO
Creatinine, Urine: 19.4 mg/dL
Microalb/Creat Ratio: <15 mg/g creat (ref 0–29)
Microalbumin, Urine: <3 ug/mL

## 2022-02-20 LAB — TSH+FREE T4
Free T4: 1.29 ng/dL (ref 0.82–1.77)
TSH: 0.842 u[IU]/mL (ref 0.450–4.500)

## 2022-02-21 ENCOUNTER — Ambulatory Visit
Admission: RE | Admit: 2022-02-21 | Discharge: 2022-02-21 | Disposition: A | Discharge status: Home / Self Care | Payer: BC Managed Care – PPO | Source: Ambulatory Visit | Attending: Nurse Practitioner | Admitting: Nurse Practitioner

## 2022-02-21 DIAGNOSIS — Z8679 Personal history of other diseases of the circulatory system: Secondary | ICD-10-CM

## 2022-02-25 ENCOUNTER — Encounter: Payer: Self-pay | Admitting: Nurse Practitioner

## 2022-02-26 ENCOUNTER — Other Ambulatory Visit: Payer: Self-pay | Admitting: Nurse Practitioner

## 2022-02-26 DIAGNOSIS — E1169 Type 2 diabetes mellitus with other specified complication: Secondary | ICD-10-CM

## 2022-02-26 MED ORDER — BLOOD GLUCOSE MONITOR KIT: PACK | 0 refills | Status: DC

## 2022-02-27 NOTE — Progress Notes (Signed)
[facility] PREP Evaluation  Patient Details  Name: Amanda Henry MRN: [id number] Date of Birth: 01/03/1966 Age: 56 y.o. PCP: [name], FNP  Vitals:   02/26/22 1730  BP: (!) 142/80  Pulse: 63  SpO2: 98%  Weight: 202 lb 6.4 oz (91.8 kg)     [facility] Eval - 02/27/22 1300       [facility] "PREP" Location   [facility]" Location [facility]      Referral    Referring Provider [place]    Reason for referral Diabetes;Hypertension    Program Start Date 03/04/22   T/TH 6p-715p x 12wks     Measurement   Waist Circumference 43.5 inches    Hip Circumference 44.5 inches    Body fat 39.7 percent      Information for Trainer   Goals feel better, manage stress, lose 20 lbs    Current Exercise walks 3-4 x wk for 20-30 min    Orthopedic Concerns L knee, L sciatica, ankles weak    Pertinent Medical History HTN, dec thyroid, DM2    Current Barriers none    Medications that affect exercise Medication causing dizziness/drowsiness      Timed Up and Go (TUGS)   Timed Up and Go Low risk <9 seconds      Mobility and Daily Activities   I find it easy to walk up or down two or more flights of stairs. 3    I have no trouble taking out the trash. 4    I do housework such as vacuuming and dusting on my own without difficulty. 4    I can easily lift a gallon of milk (8lbs). 4    I can easily walk a mile. 4    I have no trouble reaching into high cupboards or reaching down to pick up something from the floor. 2    I do not have trouble doing out-door work such as Armed forces logistics/support/administrative officer, raking leaves, or gardening. 3      Mobility and Daily Activities   I feel younger than my age. 2    I feel independent. 4    I feel energetic. 2    I live an active life.  3    I feel strong. 2    I feel healthy. 2    I feel active as other people my age. 2      How fit and strong are you.   Fit and Strong Total Score 41            Past Medical History:  Diagnosis Date   Anxiety    Blood transfusion without  reported diagnosis    Depression    DM2 (diabetes mellitus, type 2) ([place])    Hypertension    no medications at this time   Hypothyroidism    Iron deficiency anemia    Obesity    Palpitations    Subarachnoid hemorrhage ([place])    Thyroid disease    Vitamin D deficiency    Past Surgical History:  Procedure Laterality Date   IR ANGIO INTRA EXTRACRAN SEL COM CAROTID INNOMINATE UNI L MOD SED  03/16/2021   IR ANGIO INTRA EXTRACRAN SEL COM CAROTID INNOMINATE UNI L MOD SED  03/26/2021   IR ANGIO INTRA EXTRACRAN SEL INTERNAL CAROTID UNI R MOD SED  03/15/2021   IR ANGIO INTRA EXTRACRAN SEL INTERNAL CAROTID UNI R MOD SED  03/26/2021   IR ANGIO INTRA EXTRACRAN SEL INTERNAL CAROTID UNI R MOD SED  03/25/2021   IR ANGIO INTRA EXTRACRAN SEL INTERNAL CAROTID UNI R MOD SED  10/29/2021   IR ANGIO VERTEBRAL SEL VERTEBRAL UNI L MOD SED  03/15/2021   IR ANGIO VERTEBRAL SEL VERTEBRAL UNI L MOD SED  03/26/2021   IR ANGIOGRAM FOLLOW UP STUDY  03/26/2021   IR ANGIOGRAM FOLLOW UP STUDY  03/26/2021   IR TRANSCATH/EMBOLIZ  03/26/2021   IR US GUIDE VASC ACCESS RIGHT  03/26/2021   IR US GUIDE VASC ACCESS RIGHT  10/29/2021   LAPAROSCOPIC VAGINAL HYSTERECTOMY WITH SALPINGECTOMY Bilateral 10/01/2016   Procedure: LAPAROSCOPIC ASSISTED VAGINAL HYSTERECTOMY WITH SALPINGECTOMY;  Surgeon: Paula Compton, MD;  Location: [facility];  Service: Gynecology;  Laterality: Bilateral;   PARATHYROIDECTOMY Right 04/05/2021   Procedure: right inferioir PARATHYROIDECTOMY;  Surgeon: Armandina Gemma, MD;  Location: [facility];  Service: General;  Laterality: Right;   RADIOLOGY WITH ANESTHESIA N/A 03/15/2021   Procedure: IR WITH ANESTHESIA;  Surgeon: Consuella Lose, MD;  Location: [facility];  Service: Radiology;  Laterality: N/A;   RADIOLOGY WITH ANESTHESIA N/A 03/26/2021   Procedure: IR WITH ANESTHESIA (Coiling and Embolization);  Surgeon: Consuella Lose, MD;  Location: [facility];  Service: Radiology;  Laterality: N/A;   WISDOM TOOTH EXTRACTION      Social History   Tobacco Use  Smoking Status Never  Smokeless Tobacco Never    Barnett Hatter 02/27/2022, 1:12 PM

## 2022-03-11 ENCOUNTER — Encounter: Payer: Self-pay | Admitting: Nurse Practitioner

## 2022-03-11 ENCOUNTER — Ambulatory Visit (INDEPENDENT_AMBULATORY_CARE_PROVIDER_SITE_OTHER): Payer: BC Managed Care – PPO | Admitting: Nurse Practitioner

## 2022-03-11 VITALS — BP 136/76 | HR 87 | Temp 98.9°F | Ht 67.0 in | Wt 202.0 lb

## 2022-03-11 DIAGNOSIS — J029 Acute pharyngitis, unspecified: Secondary | ICD-10-CM | POA: Diagnosis not present

## 2022-03-11 DIAGNOSIS — J3489 Other specified disorders of nose and nasal sinuses: Secondary | ICD-10-CM

## 2022-03-11 DIAGNOSIS — R0981 Nasal congestion: Secondary | ICD-10-CM | POA: Diagnosis not present

## 2022-03-11 MED ORDER — AZITHROMYCIN 250 MG PO TABS: ORAL_TABLET | ORAL | 0 refills | Status: AC

## 2022-03-11 NOTE — Progress Notes (Signed)
Barnet Glasgow Martin,acting as a Education administrator for [name], FNP.,have documented all relevant documentation on the behalf of [name], FNP,as directed by  [name], FNP while in the presence of [name], Kelly.    Subjective:     Patient ID: Amanda Henry , female    DOB: 1965-09-14 , 56 y.o.   MRN: [id number]   Chief Complaint  Patient presents with   URI    HPI  Patient is having nasal congestion, swollen lymph nodes, sore throat, left ear pain, and slight headache. Patient states she is extremely tired as well, her symptoms started a 1.5 weeks. She did not take a covid test at the beginning of her symptoms.   BP Readings from Last 3 Encounters: 03/11/22 : 136/76 02/26/22 : (!) 142/80 02/19/22 : 118/72    URI  This is a new problem. The current episode started 1 to 4 weeks ago (1.5 weeks). There has been no fever. Associated symptoms include congestion, ear pain (left, dull and aching), headaches, a sore throat and swollen glands. Pertinent negatives include no abdominal pain, chest pain, coughing or vomiting. She has tried acetaminophen (coricidan last night and tylenol the day before) for the symptoms.     Past Medical History:  Diagnosis Date   Anxiety    Blood transfusion without reported diagnosis    Depression    DM2 (diabetes mellitus, type 2) ([place])    Hypertension    no medications at this time   Hypothyroidism    Iron deficiency anemia    Obesity    Palpitations    Subarachnoid hemorrhage (H[facility])    Thyroid disease    Vitamin D deficiency      Family History  Problem Relation Age of Onset   Thyroid disease Mother    Hypertension Mother    Diabetes Father    Hypertension Other    Diabetes Other    Thyroid disease Other    Heart disease Other      Current Outpatient Medications:    ALPRAZolam (XANAX XR) 1 MG 24 hr tablet, Take 0.5 mg by mouth daily as needed for sleep., Disp: , Rfl:    amLODipine (NORVASC) 2.5 MG tablet, Take 1 tablet (2.5 mg  total) by mouth daily., Disp: 30 tablet, Rfl: 11   azithromycin (ZITHROMAX) 250 MG tablet, Take 2 tablets (500 mg) on  Day 1,  followed by 1 tablet (250 mg) once daily on Days 2 through 5., Disp: 6 each, Rfl: 0   blood glucose meter kit and supplies KIT, Dispense based on patient and insurance preference. Use up to four times daily as directed., Disp: 1 each, Rfl: 0   escitalopram (LEXAPRO) 10 MG tablet, Take 1 tablet (10 mg total) by mouth daily., Disp: 30 tablet, Rfl: 1   Glucagon (GVOKE HYPOPEN 2-PACK) 0.5 MG/0.1ML SOAJ, Inject 0.1 mg into the skin as needed., Disp: 0.1 mL, Rfl: 6   levothyroxine (SYNTHROID) 125 MCG tablet, Take 1 tablet (125 mcg total) by mouth daily., Disp: 45 tablet, Rfl: 3   MAGNESIUM GLYCINATE PO, Take 500 mg by mouth., Disp: , Rfl:    meclizine (ANTIVERT) 12.5 MG tablet, Take 1 tablet (12.5 mg total) by mouth 3 (three) times daily as needed for dizziness., Disp: 30 tablet, Rfl: 1   metoprolol tartrate (LOPRESSOR) 25 MG tablet, Take 1 tablet (25 mg total) by mouth 2 (two) times daily., Disp: 180 tablet, Rfl: 0   olmesartan (BENICAR) 20 MG tablet, Take 1 tablet (20 mg total)  by mouth daily., Disp: 90 tablet, Rfl: 1   Semaglutide,0.25 or 0.5MG/DOS, (OZEMPIC, 0.25 OR 0.5 MG/DOSE,) 2 MG/3ML SOPN, Inject 0.5 mg into the skin once a week., Disp: 9 mL, Rfl: 1   Vitamin D, Ergocalciferol, (DRISDOL) 1.25 MG (50000 UNIT) CAPS capsule, Take 1 capsule (50,000 Units total) by mouth every 7 (seven) days. (Patient taking differently: Take 50,000 Units by mouth every 7 (seven) days. Wednesday), Disp: 12 capsule, Rfl: 1   ipratropium (ATROVENT) 0.06 % nasal spray, Place 2 sprays into the nose 3 (three) times daily. (Patient not taking: Reported on 12/05/2021), Disp: 15 mL, Rfl: 2   Allergies  Allergen Reactions   Latex Rash   Penicillins Rash    Has patient had a PCN reaction causing immediate rash, facial/tongue/throat swelling, SOB or lightheadedness with hypotension: Yes Has patient had  a PCN reaction causing severe rash involving mucus membranes or skin necrosis: No Has patient had a PCN reaction that required hospitalization No Has patient had a PCN reaction occurring within the last 10 years: No If all of the above answers are "NO", then may proceed with Cephalosporin use.      Review of Systems  HENT:  Positive for congestion, ear pain (left, dull and aching) and sore throat.   Respiratory:  Negative for cough.   Cardiovascular:  Negative for chest pain.  Gastrointestinal:  Negative for abdominal pain and vomiting.  Neurological:  Positive for headaches.  Psychiatric/Behavioral: Negative.       Today's Vitals   03/11/22 1530  BP: 136/76  Pulse: 87  Temp: 98.9 F (37.2 C)  TempSrc: Oral  Weight: 202 lb (91.6 kg)  Height: '5\' 7"'  (1.702 m)  PainSc: 5   PainLoc: Throat   Body mass index is 31.64 kg/m.  Wt Readings from Last 3 Encounters:  03/11/22 202 lb (91.6 kg)  02/26/22 202 lb 6.4 oz (91.8 kg)  02/19/22 201 lb 9.6 oz (91.4 kg)    Objective:  Physical Exam Vitals reviewed.  Constitutional:      General: She is not in acute distress.    Appearance: Normal appearance. She is obese.  HENT:     Right Ear: Tympanic membrane, ear canal and external ear normal. There is no impacted cerumen.     Left Ear: Tympanic membrane, ear canal and external ear normal. There is no impacted cerumen.  Cardiovascular:     Rate and Rhythm: Normal rate and regular rhythm.     Pulses: Normal pulses.     Heart sounds: Normal heart sounds. No murmur heard. Pulmonary:     Effort: Pulmonary effort is normal. No respiratory distress.     Breath sounds: Normal breath sounds. No wheezing.  Musculoskeletal:     Cervical back: Normal range of motion. Tenderness (left submandibular) present. No rigidity.  Skin:    General: Skin is warm and dry.     Capillary Refill: Capillary refill takes less than 2 seconds.  Neurological:     General: No focal deficit present.     Mental  Status: She is alert and oriented to person, place, and time.     Cranial Nerves: No cranial nerve deficit.     Motor: No weakness.  Psychiatric:        Mood and Affect: Mood normal.        Behavior: Behavior normal.        Thought Content: Thought content normal.        Judgment: Judgment normal.  Assessment And Plan:     1. Sore throat Comments: Encouraged to drink hot tea with lemon and honey.  - POC COVID-19 BinaxNow - Novel Coronavirus, NAA ([facility]) - POCT Influenza A/B - azithromycin (ZITHROMAX) 250 MG tablet; Take 2 tablets (500 mg) on  Day 1,  followed by 1 tablet (250 mg) once daily on Days 2 through 5.  Dispense: 6 each; Refill: 0  2. Nasal congestion Comments: Encouraged to continue using nasal spray.  - azithromycin (ZITHROMAX) 250 MG tablet; Take 2 tablets (500 mg) on  Day 1,  followed by 1 tablet (250 mg) once daily on Days 2 through 5.  Dispense: 6 each; Refill: 0  3. Sinus pressure Comments: will treat for possible sinus infection due to length of symptoms and pain to left submandibular lymph node and swelling - azithromycin (ZITHROMAX) 250 MG tablet; Take 2 tablets (500 mg) on  Day 1,  followed by 1 tablet (250 mg) once daily on Days 2 through 5.  Dispense: 6 each; Refill: 0     Patient was given opportunity to ask questions. Patient verbalized understanding of the plan and was able to repeat key elements of the plan. All questions were answered to their satisfaction.  [name], FNP   I, [name], FNP, have reviewed all documentation for this visit. The documentation on 03/11/22 for the exam, diagnosis, procedures, and orders are all accurate and complete.   IF YOU HAVE BEEN REFERRED TO A SPECIALIST, IT MAY TAKE 1-2 WEEKS TO SCHEDULE/PROCESS THE REFERRAL. IF YOU HAVE NOT HEARD FROM US/SPECIALIST IN TWO WEEKS, PLEASE GIVE [place] A CALL AT [phone] X [phone].   THE PATIENT IS ENCOURAGED TO PRACTICE SOCIAL DISTANCING DUE TO THE COVID-19 PANDEMIC.

## 2022-03-11 NOTE — Progress Notes (Deleted)
Patient presents today for her 1st shingrix shot.

## 2022-03-11 NOTE — Patient Instructions (Signed)
Sore Throat When you have a sore throat, your throat may feel: Tender. Burning. Irritated. Scratchy. Painful when you swallow. Painful when you talk. Many things can cause a sore throat, such as: An infection. Allergies. Dry air. Smoke or pollution. Radiation treatment for cancer. Gastroesophageal reflux disease (GERD). A tumor. A sore throat can be the first sign of another sickness. It can happen with other problems, like: Coughing. Sneezing. Fever. Swelling of the glands in the neck. Most sore throats go away without treatment. Follow these instructions at home:     Medicines Take over-the-counter and prescription medicines only as told by your doctor. Children often get sore throats. Do not give your child aspirin. Use throat sprays to soothe your throat as told by your health care provider. Managing pain To help with pain: Sip warm liquids, such as broth, herbal tea, or warm water. Eat or drink cold or frozen liquids, such as frozen ice pops. Rinse your mouth (gargle) with a salt water mixture 3-4 times a day or as needed. To make salt water, dissolve -1 tsp (3-6 g) of salt in 1 cup (237 mL) of warm water. Do not swallow this mixture. Suck on hard candy or throat lozenges. Put a cool-mist humidifier in your bedroom at night. Sit in the bathroom with the door closed for 5-10 minutes while you run hot water in the shower. General instructions Do not smoke or use any products that contain nicotine or tobacco. If you need help quitting, ask your doctor. Get plenty of rest. Drink enough fluid to keep your pee (urine) pale yellow. Wash your hands often for at least 20 seconds with soap and water. If soap and water are not available, use hand sanitizer. Contact a doctor if: You have a fever for more than 2-3 days. You keep having symptoms for more than 2-3 days. Your throat does not get better in 7 days. You have a fever and your symptoms suddenly get worse. Your  child who is 3 months to 3 years old has a temperature of 102.2F (39C) or higher. Get help right away if: You have trouble breathing. You cannot swallow fluids, soft foods, or your spit. You have swelling in your throat or neck that gets worse. You feel like you may vomit (nauseous) and this feeling lasts a long time. You cannot stop vomiting. These symptoms may be an emergency. Get help right away. Call your local emergency services (911 in the [place] Do not wait to see if the symptoms will go away. Do not drive yourself to the hospital. Summary A sore throat is a painful, burning, irritated, or scratchy throat. Many things can cause a sore throat. Take over-the-counter medicines only as told by your doctor. Get plenty of rest. Drink enough fluid to keep your pee (urine) pale yellow. Contact a doctor if your symptoms get worse or your sore throat does not get better within 7 days. This information is not intended to replace advice given to you by your health care provider. Make sure you discuss any questions you have with your health care provider. Document Revised: 08/01/2020 Document Reviewed: 08/01/2020 Elsevier Patient Education  2023 Elsevier Inc.  

## 2022-03-12 LAB — NOVEL CORONAVIRUS, NAA: SARS-CoV-2, NAA: NOT DETECTED

## 2022-03-14 NOTE — Progress Notes (Signed)
[facility] PREP Weekly Session  Patient Details  Name: Amanda Henry MRN: [id number] Date of Birth: 1965/10/29 Age: 56 y.o. PCP: [name], FNP  Vitals:   03/11/22 1800  Weight: 199 lb 9.6 oz (90.5 kg)     [facility] Weekly seesion - 03/14/22 1400       [facility] "PREP" Location   [facility] "PREP" Location [facility]      Weekly Session   Topic Discussed Other ways to be active;Importance of resistance training    Minutes exercised this week 60 minutes    Classes attended to date [place] 03/14/2022, 2:03 PM

## 2022-03-17 ENCOUNTER — Encounter: Payer: Self-pay | Admitting: Nurse Practitioner

## 2022-03-18 NOTE — Progress Notes (Unsigned)
Advanced Hypertension Clinic Initial Assessment:    Date:  03/20/2022   ID:  Amanda Henry, DOB [birth year], MRN [id number]  PCP:  [name], FNP  Cardiologist:  None  Nephrologist:  Referring MD: [name], FNP   CC: Hypertension  History of Present Illness:    Amanda Henry is a 56 y.o. 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 Referred by her primary care provider [name], FNP.  ED visit 10/11/21 with nausea and elevated blood pressure as high as 217/101. CT head unremarkable and repeat BP 170/80. Recommended to follow up with primary care. 11/26/21 with BP up to 150/120 treated with Clonidine. She has been started on Olmesartan 56m, Metoprolol tartrate 266mBID, Amlodipine 2.52m28maily by her primary care provider.   Amanda Henry diagnosed with hypertension "years ago". 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  She lives at home with her son who is 32 36ars old. Her blood pressure has been difficult to control. Blood pressure checked with arm cuff at home. It has previously been checked for accuracy at her primary care provider and found to be accurate. Readings have been 170-180/90 prior to medications. she reports tobacco use never. Alcohol use never. For exercise she is walking and participating in [employer]. she eats at home and outside of the home and does follow low sodium diet. Eats 3 meals per day and drinks water throughout.   Does snore and wakes up feeling tired. Reports occasional lightheadedness which is not bothersome. No near syncope, syncope. Tells me her palpitations have been improved with Metoprolol. Takes her medications in the morning 8am and then in the evening 6:30pm.   Previous antihypertensives: None - was previously prescribed HCTZ but was told by PCP not to start at this time  Past  Medical History:  Diagnosis Date   Anxiety    Blood transfusion without reported diagnosis    Depression    DM2 (di[place] mellitus, type 2) ([facility]  Hypertension    no medications at this time   Hypothyroidism    Iron deficiency anemia    Obesity    Palpitations    Sub[facility]achnoid hemorrhage ([facility]  Thyroid disease    Vitamin D deficiency     Past Surgical History:  Procedure Laterality Date   IR ANGIO INTRA EXTRACRAN SEL COM CAROTID INNOMINATE UNI L MOD SED  03/16/2021   IR ANGIO INTRA EXTRACRAN SEL COM CAROTID INNOMINATE UNI L MOD SED  03/26/2021   IR ANGIO INTRA EXTRACRAN SEL INTERNAL CAROTID UNI R MOD SED  03/15/2021   IR ANGIO INTRA EXTRACRAN SEL INTERNAL CAROTID UNI R MOD SED  03/26/2021   IR ANGIO INTRA EXTRACRAN SEL INTERNAL CAROTID UNI R MOD SED  03/25/2021   IR ANGIO INTRA EXTRACRAN SEL INTERNAL CAROTID UNI R MOD SED  10/29/2021   IR ANGIO VERTEBRAL SEL VERTEBRAL UNI L MOD SED  03/15/2021   IR ANGIO VERTEBRAL SEL VERTEBRAL UNI L MOD SED  03/26/2021   IR ANGIOGRAM FOLLOW UP STUDY  03/26/2021   IR ANGIOGRAM FOLLOW UP STUDY  03/26/2021   IR TRANSCATH/EMBOLIZ  03/26/2021   IR US [place] VASC ACCESS RIGHT  03/26/2021   IR US [place] VASC ACCESS RIGHT  10/29/2021   LAPAROSCOPIC VAGINAL HYSTERECTOMY WITH SALPINGECTOMY Bilateral 10/01/2016   Procedure: LAPAROSCOPIC ASSISTED VAGINAL HYSTERECTOMY WITH SALPINGECTOMY;  Surgeon: RicPaula ComptonD;  Location: [facility];  Service: Gynecology;  Laterality: Bilateral;   PARATHYROIDECTOMY Right 04/05/2021   Procedure: right inferioir PARATHYROIDECTOMY;  Surgeon: Armandina Gemma, MD;  Location: [facility];  Service: General;  Laterality: Right;   RADIOLOGY WITH ANESTHESIA N/A 03/15/2021   Procedure: IR WITH ANESTHESIA;  Surgeon: Consuella Lose, MD;  Location: [facility];  Service: Radiology;  Laterality: N/A;   RADIOLOGY WITH ANESTHESIA N/A 03/26/2021   Procedure: IR WITH ANESTHESIA (Coiling and Embolization);  Surgeon: Consuella Lose, MD;  Location: [facility];   Service: Radiology;  Laterality: N/A;   WISDOM TOOTH EXTRACTION      Current Medications: Current Meds  Medication Sig   ALPRAZolam (XANAX XR) 1 MG 24 hr tablet Take 0.5 mg by mouth daily as needed for sleep.   amLODipine (NORVASC) 2.5 MG tablet Take 1 tablet (2.5 mg total) by mouth daily.   atorvastatin (LIPITOR) 20 MG tablet Take 1 tablet (20 mg total) by mouth daily.   blood glucose meter kit and supplies KIT Dispense based on patient and insurance preference. Use up to four times daily as directed.   escitalopram (LEXAPRO) 10 MG tablet Take 1 tablet (10 mg total) by mouth daily.   Glucagon (GVOKE HYPOPEN 2-PACK) 0.5 MG/0.1ML SOAJ Inject 0.1 mg into the skin as needed.   ipratropium (ATROVENT) 0.06 % nasal spray Place 2 sprays into the nose 3 (three) times daily.   levothyroxine (SYNTHROID) 125 MCG tablet Take 1 tablet (125 mcg total) by mouth daily.   MAGNESIUM GLYCINATE PO Take 500 mg by mouth.   meclizine (ANTIVERT) 12.5 MG tablet Take 1 tablet (12.5 mg total) by mouth 3 (three) times daily as needed for dizziness.   metoprolol tartrate (LOPRESSOR) 25 MG tablet Take 1 tablet (25 mg total) by mouth 2 (two) times daily.   olmesartan (BENICAR) 20 MG tablet Take 1 tablet (20 mg total) by mouth daily.   Semaglutide,0.25 or 0.5MG/DOS, (OZEMPIC, 0.25 OR 0.5 MG/DOSE,) 2 MG/3ML SOPN Inject 0.5 mg into the skin once a week.   Vitamin D, Ergocalciferol, (DRISDOL) 1.25 MG (50000 UNIT) CAPS capsule Take 1 capsule (50,000 Units total) by mouth every 7 (seven) days. (Patient taking differently: Take 50,000 Units by mouth every 7 (seven) days. Wednesday)     Allergies:   Latex and Penicillins   Social History   Socioeconomic History   Marital status: Single    Spouse name: Not on file   Number of children: 1   Years of education: Not on file   Highest education level: Not on file  Occupational History   Occupation: Financial planner  Tobacco Use   Smoking status: Never    Smokeless tobacco: Never  Substance and Sexual Activity   Alcohol use: No   Drug use: No   Sexual activity: Yes    Birth control/protection: Condom  Other Topics Concern   Not on file  Social History Narrative   Not on file   Social Determinants of Health   Financial Resource Strain: Not on file  Food Insecurity: Not on file  Transportation Needs: Not on file  Physical Activity: Not on file  Stress: Not on file  Social Connections: Not on file     Family History: The patient's family history includes Diabetes in her father and another family member; Heart disease in an other family member; Hypertension in her mother and another family member; Thyroid disease in her mother and another family member.  ROS:   Please see the history of present illness.  All other systems reviewed and are negative.  EKGs/Labs/Other Studies Reviewed:    EKG:  EKG is not ordered today.    Recent Labs: 04/08/2021: Magnesium 1.7 02/19/2022: ALT 19; BUN 18; Creatinine, Ser 0.90; Hemoglobin 13.7; Platelets 315; Potassium 4.3; Sodium 139; TSH 0.842   Recent Lipid Panel    Component Value Date/Time   CHOL 212 (H) 02/19/2022 1732   TRIG 115 02/19/2022 1732   HDL 52 02/19/2022 1732   CHOLHDL 4.1 02/19/2022 1732   LDLCALC 139 (H) 02/19/2022 1732    Physical Exam:   VS:  BP 123/86   Pulse 79   Ht _0  (1.702 m)   Wt 201 lb 1.6 oz (91.2 kg)   LMP 08/28/2016 (Exact Date) Comment: come back on 09/15/2016  BMI 31.50 kg/m  , BMI Body mass index is 31.5 kg/m. GENERAL:  Well appearing HEENT: Pupils equal round and reactive, fundi not visualized, oral mucosa unremarkable NECK:  No jugular venous distention, waveform within normal limits, carotid upstroke brisk and symmetric, no bruits, no thyromegaly LYMPHATICS:  No cervical adenopathy LUNGS:  Clear to auscultation bilaterally HEART:  RRR.  PMI not displaced or sustained,S1 and S2 within normal limits, no S3, no S4, no clicks, no rubs, no  murmurs ABD:  Flat, positive bowel sounds normal in frequency in pitch, no bruits, no rebound, no guarding, no midline pulsatile mass, no hepatomegaly, no splenomegaly EXT:  2 plus pulses throughout, no edema, no cyanosis no clubbing SKIN:  No rashes no nodules NEURO:  Cranial nerves II through XII grossly intact, motor grossly intact throughout PSYCH:  Cognitively intact, oriented to person place and time   ASSESSMENT/PLAN:    HTN - BP well controlled in clinic but elevated 170s at home prior to medications. Reports BP cuff previously checked for accuracy. Prior ED visits for elevated BP in setting of dietary indiscretion. Will have her monitor at home for one week. If BP persistently elevated after medications plan to increase Amlodipine to 11m and/or bring her in for nurse visit to ensure home cuff accurate.  Labs today: cortisol, metanephrines, catecholamines Secondary hypertension workup: sleep study ordered Heart healthy diet and regular cardiovascular exercise encouraged.  Continue to participate in PREP.  Snores / Sleep disordered breathing -STOP-BANG score of 5.  Endorses snoring and not waking feeling well rested.  Itamar Home sleep study ordered.  DM2 / Obesity - Follows with endocrinology and PCP. Appreciate inclusion of Ozempic. Weight loss via diet and exercise encouraged. Discussed the impact being overweight would have on cardiovascular risk.   HLD - Not present on statin. Per ADA guidelines with DM2 should be on statin - Rx Atorvasatin 251mdaily.   Hypothyroidism - Continue to follow with PCP. 02/2022 normal TSH.  Screening for Secondary Hypertension:     03/20/2022    9:31 AM  Causes  Drugs/Herbals Screened  Renovascular HTN Not Screened     - Comments Consider in future if BP difficult to control  Sleep Apnea Screened  Thyroid Disease Screened  Hyperaldosteronism Not Screened     - Comments Consider in future if BP remains difficult to control, will need to hold  Olmesartan prior  Pheochromocytoma Screened  Cushing's Syndrome Screened  Hyperparathyroidism Screened  Coarctation of the Aorta Screened     - Comments BP symmetrical  Compliance Screened    Relevant Labs/Studies:    Latest Ref Rng & Units 02/19/2022    5:32 PM 12/04/2021    9:34 PM 10/29/2021    8:40  AM  Basic Labs  Sodium 134 - 144 mmol/L 139  138  136   Potassium 3.5 - 5.2 mmol/L 4.3  3.9  3.4   Creatinine 0.57 - 1.00 mg/dL 0.90  0.89  0.79        Latest Ref Rng & Units 02/19/2022    5:32 PM 12/25/2021    8:31 AM  Thyroid   TSH 0.450 - 4.500 uIU/mL 0.842  0.27                        Disposition:    FU with MD/PharmD/APP in 6 weeks    Medication Adjustments/Labs and Tests Ordered: Current medicines are reviewed at length with the patient today.  Concerns regarding medicines are outlined above.  Orders Placed This Encounter  Procedures   Cortisol   Metanephrines, plasma   Catecholamines, fractionated, plasma   Itamar Sleep Study   Meds ordered this encounter  Medications   atorvastatin (LIPITOR) 20 MG tablet    Sig: Take 1 tablet (20 mg total) by mouth daily.    Dispense:  90 tablet    Refill:  3    Order Specific Question:   Supervising Provider    Answer:   Buford Dresser [[id number]]     Signed, Loel Dubonnet, NP  03/20/2022 9:31 AM    [place]

## 2022-03-20 ENCOUNTER — Encounter (HOSPITAL_BASED_OUTPATIENT_CLINIC_OR_DEPARTMENT_OTHER): Payer: Self-pay

## 2022-03-20 ENCOUNTER — Encounter (HOSPITAL_BASED_OUTPATIENT_CLINIC_OR_DEPARTMENT_OTHER): Payer: Self-pay | Admitting: Family

## 2022-03-20 ENCOUNTER — Ambulatory Visit (INDEPENDENT_AMBULATORY_CARE_PROVIDER_SITE_OTHER): Payer: BC Managed Care – PPO | Admitting: Family

## 2022-03-20 ENCOUNTER — Telehealth: Payer: Self-pay | Admitting: *Deleted

## 2022-03-20 VITALS — BP 123/86 | HR 79 | Ht 67.0 in | Wt 201.1 lb

## 2022-03-20 DIAGNOSIS — E119 Type 2 diabetes mellitus without complications: Secondary | ICD-10-CM | POA: Diagnosis not present

## 2022-03-20 DIAGNOSIS — G4730 Sleep apnea, unspecified: Secondary | ICD-10-CM

## 2022-03-20 DIAGNOSIS — E782 Mixed hyperlipidemia: Secondary | ICD-10-CM | POA: Diagnosis not present

## 2022-03-20 DIAGNOSIS — E039 Hypothyroidism, unspecified: Secondary | ICD-10-CM | POA: Diagnosis not present

## 2022-03-20 DIAGNOSIS — I10 Essential (primary) hypertension: Secondary | ICD-10-CM

## 2022-03-20 DIAGNOSIS — R0683 Snoring: Secondary | ICD-10-CM

## 2022-03-20 MED ORDER — ATORVASTATIN CALCIUM 20 MG PO TABS: 20.0000 mg | ORAL_TABLET | Freq: Every day | ORAL | 3 refills | Status: DC

## 2022-03-20 NOTE — Telephone Encounter (Signed)
Notified Elonda Husky ok to activate itamar device.

## 2022-03-20 NOTE — Patient Instructions (Signed)
Medication Instructions:  Your physician has recommended you make the following change in your medication:   Start: Atorvastatin '20mg'$  daily    *If you need a refill on your cardiac medications before your next appointment, please call your pharmacy*   Lab Work: Your physician recommends that you return for lab work today- Cortisol, metanephrines and catecholamines   If you have labs (blood work) drawn today and your tests are completely normal, you will receive your results only by: [place] (if you have [place]) OR A paper copy in the mail If you have any lab test that is abnormal or we need to change your treatment, we will call you to review the results.   Testing/Procedures: WatchPAT?  Is a FDA cleared portable home sleep study test that uses a watch and 3 points of contact to monitor 7 different channels, including your heart rate, oxygen saturations, body position, snoring, and chest motion.  The study is easy to use from the comfort of your own home and accurately detect sleep apnea.  Before bed, you attach the chest sensor, attached the sleep apnea bracelet to your nondominant hand, and attach the finger probe.  After the study, the raw data is downloaded from the watch and scored for apnea events.   For more information: [email or web address]  Patient Testing Instructions:  Do not put battery into the device until bedtime when you are ready to begin the test. Please call the support number if you need assistance after following the instructions below: 24 hour support line- [phone] or ITAMAR support at [phone] (option 2)  Download the The First AmericanWatchPAT One" app through the google play store or App Store  Be sure to turn on or enable access to bluetooth in [place]lings on your smartphone/ device  Make sure no other bluetooth devices are on and within the vicinity of your smartphone/ device and WatchPAT watch during testing.  Make sure to leave your  smart phone/ device plugged in and charging all night.  When ready for bed:  Follow the instructions step by step in the WatchPAT One App to activate the testing device. For additional instructions, including video instruction, visit the WatchPAT One video on Youtube. You can search for [place] One within Youtube (video is 4 minutes and 18 seconds) or enter: https://youtube/watch?[email or web address]_vbiwxE Please note: You will be prompted to enter a Pin to connect via bluetooth when starting the test. The PIN will be assigned to you when you receive the test.  The device is disposable, but it recommended that you retain the device until you receive a call letting you know the study has been received and the results have been interpreted.  We will let you know if the study did not transmit to [place] properly after the test is completed. You do not need to call us to confirm the receipt of the test.  Please complete the test within 48 hours of receiving PIN.   Frequently Asked Questions:  What is Watch [place] one?  A single use fully disposable home sleep apnea testing device and will not need to be returned after completion.  What are the requirements to use WatchPAT one?  The be able to have a successful watchpat one sleep study, you should have your Watch pat one device, your smart phone, watch pat one app, your PIN number and Internet access What type of phone do I need?  You should have a smart phone that uses Android 5.1 and above or any Iphone with IOS 10  and above How can I download the WatchPAT one app?  Based on your device type search for WatchPAT one app either in google play for android devices or APP store for Iphone's Where will I get my PIN for the study?  Your PIN will be provided by your physician's office. It is used for authentication and if you lose/forget your PIN, please reach out to your providers office.  I do not have Internet at home. Can I do WatchPAT one study?  WatchPAT One needs  Internet connection throughout the night to be able to transmit the sleep data. You can use your home/local internet or your cellular's data package. However, it is always recommended to use home/local Internet. It is estimated that between 20MB-30MB will be used with each study.However, the application will be looking for 80MB space in the phone to start the study.  What happens if I lose internet or bluetooth connection?  During the internet disconnection, your phone will not be able to transmit the sleep data. All the data, will be stored in your phone. As soon as the internet connection is back on, the phone will being sending the sleep data. During the bluetooth disconnection, WatchPAT one will not be able to to send the sleep data to your phone. Data will be kept in the [facility] one until two devices have bluetooth connection back on. As soon as the connection is back on, WatchPAT one will send the sleep data to the phone.  How long do I need to wear the WatchPAT one?  After you start the study, you should wear the device at least 6 hours.  How far should I keep my phone from the device?  During the night, your phone should be within 15 feet.  What happens if I leave the room for restroom or other reasons?  Leaving the room for any reason will not cause any problem. As soon as your get back to the room, both devices will reconnect and will continue to send the sleep data. Can I use my phone during the sleep study?  Yes, you can use your phone as usual during the study. But it is recommended to put your watchpat one on when you are ready to go to bed.  How will I get my study results?  A soon as you completed your study, your sleep data will be sent to the provider. They will then share the results with you when they are ready.     Follow-Up: At [facility], you and your health needs are our priority.  As part of our continuing mission to provide you with exceptional heart care, we  have created designated Provider Care Teams.  These Care Teams include your primary Cardiologist (physician) and Advanced Practice Providers (APPs -  Physician Assistants and Nurse Practitioners) who all work together to provide you with the care you need, when you need it.  We recommend signing up for the patient portal called "MyChart".  Sign up information is provided on this After Visit Summary.  MyChart is used to connect with patients for Virtual Visits (Telemedicine).  Patients are able to view lab/test results, encounter notes, upcoming appointments, etc.  Non-urgent messages can be sent to your provider as well.   To learn more about what you can do with MyChart, go to [email or web address].    Your next appointment:   6 week(s)  The format for your next appointment:   In Person  Provider:   Laurann Montana,  NP in [facility]    Other Instructions Please check your blood pressure 1-2 hours after medications and after resting for 5-10 minutes. Daphene Jaeger will check in next week to see how the numbers are looking.   Heart Healthy Diet Recommendations: A low-salt diet is recommended. Meats should be grilled, baked, or boiled. Avoid fried foods. Focus on lean protein sources like fish or chicken with vegetables and fruits. The American Heart Association is a Microbiologist!  American Heart Association Diet and Lifeystyle Recommendations   Exercise recommendations: The American Heart Association recommends 150 minutes of moderate intensity exercise weekly. Try 30 minutes of moderate intensity exercise 4-5 times per week. This could include walking, jogging, or swimming.   Important Information About Sugar

## 2022-03-26 LAB — POCT INFLUENZA A/B
Influenza A, POC: NEGATIVE
Influenza B, POC: NEGATIVE

## 2022-03-26 LAB — POC COVID19 BINAXNOW: SARS Coronavirus 2 Ag: NEGATIVE

## 2022-03-27 ENCOUNTER — Ambulatory Visit: Payer: BC Managed Care – PPO

## 2022-03-27 ENCOUNTER — Encounter (HOSPITAL_BASED_OUTPATIENT_CLINIC_OR_DEPARTMENT_OTHER): Payer: Self-pay

## 2022-03-31 ENCOUNTER — Encounter: Payer: Self-pay | Admitting: Nurse Practitioner

## 2022-04-02 LAB — CATECHOLAMINES, FRACTIONATED, PLASMA

## 2022-04-02 LAB — METANEPHRINES, PLASMA
Metanephrine, Free: 50.6 pg/mL (ref 0.0–88.0)
Normetanephrine, Free: 87.2 pg/mL (ref 0.0–244.0)

## 2022-04-02 LAB — CORTISOL: Cortisol: 8.2 ug/dL (ref 6.2–19.4)

## 2022-04-02 NOTE — Progress Notes (Signed)
[facility] PREP Weekly Session  Patient Details  Name: Amanda Henry MRN: [id number] Date of Birth: Sep 18, 1965 Age: 56 y.o. PCP: [name], FNP  Vitals:   03/25/22 1542  Weight: 201 lb (91.2 kg)     [facility] Weekly seesion - 04/02/22 1500       [facility] "PREP" Location   [facility]PREP[facility] Location [facility]      Weekly Session   Topic Discussed Health habits    Minutes exercised this week 701 minutes    Classes attended to date [place] 04/02/2022, 3:43 PM

## 2022-04-02 NOTE — Progress Notes (Signed)
PREP class 11/14 Wt 199.6 lbs Topic health restaurant eating( salt demo)  Min of exercise 596  Attended: 7

## 2022-04-04 ENCOUNTER — Encounter (HOSPITAL_BASED_OUTPATIENT_CLINIC_OR_DEPARTMENT_OTHER): Payer: Self-pay

## 2022-04-04 ENCOUNTER — Telehealth (HOSPITAL_BASED_OUTPATIENT_CLINIC_OR_DEPARTMENT_OTHER): Payer: Self-pay

## 2022-04-04 NOTE — Telephone Encounter (Addendum)
Called [facility] to see why Cats were not drawn, per [facility], there was not enough specimen drawn to run the catecholamine level, per [facility] the patient was given recollection instructions. Left message with patient to call us back in regards to recollection.    ----- Message from Theora Gianotti, NP sent at 04/03/2022  5:45 PM EST ----- Cortisol and metanephrines are normal.  It appears that catecholamine levels were not performed.

## 2022-04-04 NOTE — Telephone Encounter (Signed)
Patient was returning call. Please advise ?

## 2022-04-04 NOTE — Telephone Encounter (Signed)
Left message for patient to call back  

## 2022-04-06 ENCOUNTER — Encounter: Payer: Self-pay | Admitting: Nurse Practitioner

## 2022-04-07 ENCOUNTER — Other Ambulatory Visit: Payer: Self-pay

## 2022-04-07 MED ORDER — GLUCOSE BLOOD VI STRP: ORAL_STRIP | 12 refills | Status: DC

## 2022-04-08 ENCOUNTER — Other Ambulatory Visit: Payer: Self-pay | Admitting: Nurse Practitioner

## 2022-04-09 ENCOUNTER — Encounter: Payer: Self-pay | Admitting: Nurse Practitioner

## 2022-04-14 ENCOUNTER — Telehealth: Payer: Self-pay

## 2022-04-14 MED ORDER — AMLODIPINE BESYLATE 5 MG PO TABS: 5.0000 mg | ORAL_TABLET | Freq: Every day | ORAL | 11 refills | Status: DC

## 2022-04-14 NOTE — Telephone Encounter (Signed)
Updated BP log

## 2022-04-17 ENCOUNTER — Ambulatory Visit (INDEPENDENT_AMBULATORY_CARE_PROVIDER_SITE_OTHER): Payer: BC Managed Care – PPO | Admitting: Nurse Practitioner

## 2022-04-17 ENCOUNTER — Encounter: Payer: Self-pay | Admitting: Nurse Practitioner

## 2022-04-17 VITALS — BP 132/70 | HR 91 | Temp 98.5°F | Ht 67.0 in | Wt 199.0 lb

## 2022-04-17 DIAGNOSIS — R051 Acute cough: Secondary | ICD-10-CM | POA: Diagnosis not present

## 2022-04-17 MED ORDER — TRIAMCINOLONE ACETONIDE 40 MG/ML IJ SUSP
40.0000 mg | Freq: Once | INTRAMUSCULAR | Status: AC
  Administered 2022-04-17: 40 mg via INTRAMUSCULAR

## 2022-04-17 MED ORDER — AZELASTINE HCL 0.1 % NA SOLN: 2.0000 | Freq: Two times a day (BID) | NASAL | 2 refills | Status: DC

## 2022-04-17 NOTE — Patient Instructions (Signed)

## 2022-04-17 NOTE — Patient Instructions (Signed)

## 2022-04-17 NOTE — Progress Notes (Signed)
I,[name],acting as a Education administrator for [employer], FNP.,have documented all relevant documentation on the behalf of [name], FNP,as directed by  [name], FNP while in the presence of [name], [place].  Subjective:     Patient ID: Amanda Henry , female    DOB: 10/19/1965 , 56 y.o.   MRN: [id number]   Chief Complaint  Patient presents with   Cough    HPI  Patient presents today for treatment for cough. She was treated with azithromycin. It kept lingering and it got worse one week ago. When she got sick this time she did not do a covid test.   Cough This is a recurrent problem. The current episode started 1 to 4 weeks ago. The cough is Non-productive. Associated symptoms include nasal congestion and postnasal drip. Pertinent negatives include no chest pain, fever or sore throat. Associated symptoms comments: Mucous to nose is clear. . Treatments tried: mucinex and coricidan. There is no history of asthma or bronchitis.     Past Medical History:  Diagnosis Date   Anxiety    Blood transfusion without reported diagnosis    Depression    DM2 (diabetes mellitus, type 2) ([place])    Hypertension    no medications at this time   Hypothyroidism    Iron deficiency anemia    Obesity    Palpitations    Subarachnoid hemorrhage ([place])    Thyroid disease    Vitamin D deficiency      Family History  Problem Relation Age of Onset   Thyroid disease Mother    Hypertension Mother    Diabetes Father    Hypertension Other    Diabetes Other    Thyroid disease Other    Heart disease Other      Current Outpatient Medications:    azelastine (ASTELIN) 0.1 % nasal spray, Place 2 sprays into both nostrils 2 (two) times daily. Use in each nostril as directed, Disp: 30 mL, Rfl: 2   ALPRAZolam (XANAX XR) 1 MG 24 hr tablet, Take 0.5 mg by mouth daily as needed for sleep., Disp: , Rfl:    amLODipine-olmesartan (AZOR) 5-40 MG tablet, Take 1 tablet by mouth daily., Disp: 90 tablet, Rfl:  1   atorvastatin (LIPITOR) 20 MG tablet, Take 1 tablet (20 mg total) by mouth daily., Disp: 90 tablet, Rfl: 3   blood glucose meter kit and supplies KIT, Dispense based on patient and insurance preference. Use up to four times daily as directed., Disp: 1 each, Rfl: 0   escitalopram (LEXAPRO) 10 MG tablet, Take 1 tablet (10 mg total) by mouth daily., Disp: 30 tablet, Rfl: 1   Glucagon (GVOKE HYPOPEN 2-PACK) 0.5 MG/0.1ML SOAJ, Inject 0.1 mg into the skin as needed., Disp: 0.1 mL, Rfl: 6   glucose blood test strip, Use as instructed, Disp: 100 each, Rfl: 12   ipratropium (ATROVENT) 0.06 % nasal spray, Place 2 sprays into the nose 3 (three) times daily., Disp: 15 mL, Rfl: 2   levothyroxine (SYNTHROID) 125 MCG tablet, Take 1 tablet (125 mcg total) by mouth daily., Disp: 45 tablet, Rfl: 3   MAGNESIUM GLYCINATE PO, Take 500 mg by mouth., Disp: , Rfl:    meclizine (ANTIVERT) 12.5 MG tablet, Take 1 tablet (12.5 mg total) by mouth 3 (three) times daily as needed for dizziness., Disp: 30 tablet, Rfl: 1   metoprolol succinate (TOPROL XL) 25 MG 24 hr tablet, Take 1 tablet (25 mg total) by mouth daily., Disp: 90 tablet, Rfl: 1  Semaglutide,0.25 or 0.5MG/DOS, (OZEMPIC, 0.25 OR 0.5 MG/DOSE,) 2 MG/3ML SOPN, Inject 0.5 mg into the skin once a week., Disp: 9 mL, Rfl: 1   Vitamin D, Ergocalciferol, (DRISDOL) 1.25 MG (50000 UNIT) CAPS capsule, Take 1 capsule (50,000 Units total) by mouth every 7 (seven) days. (Patient taking differently: Take 50,000 Units by mouth every 7 (seven) days. Wednesday), Disp: 12 capsule, Rfl: 1   Allergies  Allergen Reactions   Latex Rash   Penicillins Rash    Has patient had a PCN reaction causing immediate rash, facial/tongue/throat swelling, SOB or lightheadedness with hypotension: Yes Has patient had a PCN reaction causing severe rash involving mucus membranes or skin necrosis: No Has patient had a PCN reaction that required hospitalization No Has patient had a PCN reaction occurring  within the last 10 years: No If all of the above answers are "NO", then may proceed with Cephalosporin use.      Review of Systems  Constitutional: Negative.  Negative for fever.  HENT:  Positive for congestion and postnasal drip. Negative for sore throat.   Respiratory:  Positive for cough.   Cardiovascular: Negative.  Negative for chest pain.  Gastrointestinal: Negative.   Neurological: Negative.   Psychiatric/Behavioral: Negative.       Today's Vitals   04/17/22 1525  BP: 132/70  Pulse: 91  Temp: 98.5 F (36.9 C)  TempSrc: Oral  Weight: 199 lb (90.3 kg)  Height: _0  (1.702 m)   Body mass index is 31.17 kg/m.   Objective:  Physical Exam Vitals reviewed.  Constitutional:      General: She is not in acute distress.    Appearance: Normal appearance. She is obese.  HENT:     Nose: Mucosal edema and rhinorrhea present. No nasal tenderness.     Right Turbinates: Swollen.     Left Turbinates: Swollen.  Cardiovascular:     Rate and Rhythm: Normal rate and regular rhythm.     Pulses: Normal pulses.     Heart sounds: Normal heart sounds. No murmur heard. Pulmonary:     Effort: Pulmonary effort is normal. No respiratory distress.     Breath sounds: Normal breath sounds. No wheezing.  Skin:    General: Skin is warm and dry.     Capillary Refill: Capillary refill takes less than 2 seconds.  Neurological:     General: No focal deficit present.     Mental Status: She is alert and oriented to person, place, and time.     Cranial Nerves: No cranial nerve deficit.     Motor: No weakness.         Assessment And Plan:     1. Acute cough Comments: Cough has been worsening and no relief with steroid sprays. Will treat with Kenalog - azelastine (ASTELIN) 0.1 % nasal spray; Place 2 sprays into both nostrils 2 (two) times daily. Use in each nostril as directed  Dispense: 30 mL; Refill: 2 - triamcinolone acetonide (KENALOG-40) injection 40 mg    Patient was given  opportunity to ask questions. Patient verbalized understanding of the plan and was able to repeat key elements of the plan. All questions were answered to their satisfaction.  [name], FNP   I, [name], FNP, have reviewed all documentation for this visit. The documentation on 04/17/22 for the exam, diagnosis, procedures, and orders are all accurate and complete.   IF YOU HAVE BEEN REFERRED TO A SPECIALIST, IT MAY TAKE 1-2 WEEKS TO SCHEDULE/PROCESS THE REFERRAL. IF YOU HAVE  NOT HEARD FROM US/SPECIALIST IN TWO WEEKS, PLEASE GIVE [place] A CALL AT [phone].   THE PATIENT IS ENCOURAGED TO PRACTICE SOCIAL DISTANCING DUE TO THE COVID-19 PANDEMIC.

## 2022-04-17 NOTE — Progress Notes (Signed)
[facility] PREP Weekly Session  Patient Details  Name: Amanda Henry MRN: [id number] Date of Birth: [birth year] Age: 56 y.o. PCP: [name], FNP  Vitals:   04/15/22 1800  Weight: 200 lb (90.7 kg)     [facility] Weekly seesion - 04/17/22 1700       [facility] "PREP" Location   [facility]" [facility]      Weekly Session   Topic Discussed Expectations and non-scale victories    Minutes exercised this week 180 minutes    Classes attended to date [place] 04/17/2022, 5:10 PM

## 2022-04-17 NOTE — Progress Notes (Signed)
Pt presents today for second shingrix injection.

## 2022-04-22 ENCOUNTER — Encounter: Payer: Self-pay | Admitting: Nurse Practitioner

## 2022-04-22 ENCOUNTER — Other Ambulatory Visit: Payer: Self-pay

## 2022-04-22 DIAGNOSIS — E1165 Type 2 diabetes mellitus with hyperglycemia: Secondary | ICD-10-CM

## 2022-04-22 MED ORDER — OZEMPIC (0.25 OR 0.5 MG/DOSE) 2 MG/3ML ~~LOC~~ SOPN: 0.5000 mg | PEN_INJECTOR | SUBCUTANEOUS | 1 refills | Status: DC

## 2022-04-28 ENCOUNTER — Encounter: Payer: Self-pay | Admitting: Nurse Practitioner

## 2022-04-30 ENCOUNTER — Encounter (HOSPITAL_BASED_OUTPATIENT_CLINIC_OR_DEPARTMENT_OTHER): Payer: BC Managed Care – PPO | Admitting: Cardiology

## 2022-04-30 DIAGNOSIS — G4733 Obstructive sleep apnea (adult) (pediatric): Secondary | ICD-10-CM

## 2022-05-01 ENCOUNTER — Encounter (HOSPITAL_BASED_OUTPATIENT_CLINIC_OR_DEPARTMENT_OTHER): Payer: Self-pay | Admitting: Family

## 2022-05-01 ENCOUNTER — Ambulatory Visit (INDEPENDENT_AMBULATORY_CARE_PROVIDER_SITE_OTHER): Payer: BC Managed Care – PPO | Admitting: Family

## 2022-05-01 VITALS — BP 147/88 | HR 57 | Ht 67.0 in | Wt 200.0 lb

## 2022-05-01 DIAGNOSIS — E782 Mixed hyperlipidemia: Secondary | ICD-10-CM

## 2022-05-01 DIAGNOSIS — E039 Hypothyroidism, unspecified: Secondary | ICD-10-CM | POA: Diagnosis not present

## 2022-05-01 DIAGNOSIS — R0683 Snoring: Secondary | ICD-10-CM

## 2022-05-01 DIAGNOSIS — G4730 Sleep apnea, unspecified: Secondary | ICD-10-CM

## 2022-05-01 DIAGNOSIS — I10 Essential (primary) hypertension: Secondary | ICD-10-CM

## 2022-05-01 DIAGNOSIS — E119 Type 2 diabetes mellitus without complications: Secondary | ICD-10-CM | POA: Diagnosis not present

## 2022-05-01 MED ORDER — AMLODIPINE-OLMESARTAN 5-40 MG PO TABS: 1.0000 | ORAL_TABLET | Freq: Every day | ORAL | 1 refills | Status: DC

## 2022-05-01 MED ORDER — METOPROLOL SUCCINATE ER 25 MG PO TB24: 25.0000 mg | ORAL_TABLET | Freq: Every day | ORAL | 1 refills | Status: DC

## 2022-05-01 NOTE — Patient Instructions (Addendum)
Medication Instructions:   STOP Amlodipine  STOP Olmesartan  START Amlodipine-Olmesartan 5-'40mg'$  one tablet daily  If you want to use up your current medications or if there is a delay at the pharmacy picking it up... You may take one of your Amlodipine and two of your Olmesartan together.  STOP Metoprolol Tartrate  START Metoprolol Succinate one '25mg'$  tablet daily  ________________________________________  [facility]: Your physician recommends that you return for lab work in 1-2 weeks for CMP, lipid panel  Please return for Lab work. You may come to the...   [facility] (3rd floor) [address] [place], [place] [address]  Open: 8am-Noon and 1pm-4:30pm  Please ring the doorbell on the small table when you exit the elevator and the Lab Tech will come get you  [facility] [address] [place], [place], [place] [address] Open: 8am-1pm, then 2pm-4:30pm   [facility]- Please see attached locations sheet stapled to your lab work with address and hours.     ________________________________________  Testing/Procedures: Your physician has requested that you have a renal artery duplex. During this test, an ultrasound is used to evaluate blood flow to the kidneys. Allow one hour for this exam. Do not eat after midnight the day before and avoid carbonated beverages. Take your medications as you usually do.    ________________________________________  Follow-Up: In 6-8 weeks with Dr. Oval Linsey or Loel Dubonnet, NP in Hypertension Clinic

## 2022-05-01 NOTE — Progress Notes (Signed)
[facility] Initial Assessment:    Date:  05/01/2022   ID:  Amanda Henry, DOB [birth year], MRN [id number]  PCP:  [name], FNP  Cardiologist:  None  Nephrologist:  Referring MD: [name], FNP   CC: Hypertension  History of Present Illness:    Amanda Henry is a 56 y.o. 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 Referred by her primary care provider [name], FNP.  ED visit 10/11/21 with nausea and elevated blood pressure as high as 217/101. CT head unremarkable and repeat BP 170/80. Recommended to follow up with primary care. 11/26/21 with BP up to 150/120 treated with Clonidine. She has been started on Olmesartan 66m, Metoprolol tartrate 230mBID, Amlodipine 2.49m649maily by her primary care provider.   Established with hypertension clinic 03/20/2022. Amanda Henry diagnosed with hypertension "years ago". 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  She lives at home with her son who is 32 49ars old.  No tobacco no alcohol use.  Following overall diet.  BP cuff at home was 170s-180s/90s prior to medication though well-controlled in clinic with reading 123/86.  She was participating in the prep exercise program.  Home sleep study ordered due to sleep disordered breathing.  Serum cortisol negative.  Given diabetes and hyperlipidemia she was started on atorvastatin 20 mg daily.  Via MyChart message 04/04/2021 her amlodipine was increased to 5 mg daily due to elevated blood pressure.  She presents today for follow-up. She is in her second month of the PREP program. Does note some stressors with busy work schedule.  Blood pressure at home prior to medications 170s over 90s and often 150s over 80s after medications.  Does note some improvement since increased dose of amlodipine. She is interested in combination tablet.   She completed her sleep study last night and is awaiting results.  Previous antihypertensives: None - was previously prescribed HCTZ but was told by PCP not to start at this time  Past Medical History:  Diagnosis Date   Anxiety    Blood transfusion without reported diagnosis    Depression    DM2 (diabetes [place], type 2) ([place]  Hypertension    no medications at this time   Hypothyroidism    Iron deficiency anemia    Obesity    Palpitations    [place]oid hemorrhage ([place]  Thyroid disease    Vitamin D deficiency     Past Surgical History:  Procedure Laterality Date   IR ANGIO INTRA EXTRACRAN SEL COM CAROTID INNOMINATE UNI L MOD SED  03/16/2021   IR ANGIO INTRA EXTRACRAN SEL COM CAROTID INNOMINATE UNI L MOD SED  03/26/2021   IR ANGIO INTRA EXTRACRAN SEL INTERNAL CAROTID UNI R MOD SED  03/15/2021   IR ANGIO INTRA EXTRACRAN SEL INTERNAL CAROTID UNI R MOD SED  03/26/2021   IR ANGIO INTRA EXTRACRAN SEL INTERNAL CAROTID UNI R MOD SED  03/25/2021   IR ANGIO INTRA EXTRACRAN SEL INTERNAL CAROTID UNI R MOD SED  10/29/2021   IR ANGIO VERTEBRAL SEL VERTEBRAL UNI L MOD SED  03/15/2021   IR ANGIO VERTEBRAL SEL VERTEBRAL UNI L MOD SED  03/26/2021   IR ANGIOGRAM FOLLOW UP STUDY  03/26/2021   IR ANGIOGRAM FOLLOW UP STUDY  03/26/2021   IR TRANSCATH/EMBOLIZ  03/26/2021   IR US [place] VASC ACCESS RIGHT  03/26/2021   IR [place]  GUIDE VASC ACCESS RIGHT  10/29/2021   LAPAROSCOPIC VAGINAL HYSTERECTOMY WITH SALPINGECTOMY Bilateral 10/01/2016   Procedure: LAPAROSCOPIC ASSISTED VAGINAL HYSTERECTOMY WITH SALPINGECTOMY;  Surgeon: Paula Compton, MD;  Location: [facility];  Service: Gynecology;  Laterality: Bilateral;   PARATHYROIDECTOMY Right 04/05/2021   Procedure: right inferioir PARATHYROIDECTOMY;  Surgeon: Armandina Gemma, MD;  Location: [facility];  Service: General;  Laterality: Right;   RADIOLOGY WITH ANESTHESIA N/A 03/15/2021   Procedure: IR WITH ANESTHESIA;  Surgeon: Consuella Lose, MD;  Location: [facility];   Service: Radiology;  Laterality: N/A;   RADIOLOGY WITH ANESTHESIA N/A 03/26/2021   Procedure: IR WITH ANESTHESIA (Coiling and Embolization);  Surgeon: Consuella Lose, MD;  Location: [facility];  Service: Radiology;  Laterality: N/A;   WISDOM TOOTH EXTRACTION      Current Medications: Current Meds  Medication Sig   ALPRAZolam (XANAX XR) 1 MG 24 hr tablet Take 0.5 mg by mouth daily as needed for sleep.   amLODipine-olmesartan (AZOR) 5-40 MG tablet Take 1 tablet by mouth daily.   atorvastatin (LIPITOR) 20 MG tablet Take 1 tablet (20 mg total) by mouth daily.   azelastine (ASTELIN) 0.1 % nasal spray Place 2 sprays into both nostrils 2 (two) times daily. Use in each nostril as directed   blood glucose meter kit and supplies KIT Dispense based on patient and insurance preference. Use up to four times daily as directed.   escitalopram (LEXAPRO) 10 MG tablet Take 1 tablet (10 mg total) by mouth daily.   Glucagon (GVOKE HYPOPEN 2-PACK) 0.5 MG/0.1ML SOAJ Inject 0.1 mg into the skin as needed.   glucose blood test strip Use as instructed   ipratropium (ATROVENT) 0.06 % nasal spray Place 2 sprays into the nose 3 (three) times daily.   levothyroxine (SYNTHROID) 125 MCG tablet Take 1 tablet (125 mcg total) by mouth daily.   MAGNESIUM GLYCINATE PO Take 500 mg by mouth.   meclizine (ANTIVERT) 12.5 MG tablet Take 1 tablet (12.5 mg total) by mouth 3 (three) times daily as needed for dizziness.   metoprolol succinate (TOPROL XL) 25 MG 24 hr tablet Take 1 tablet (25 mg total) by mouth daily.   Semaglutide,0.25 or 0.5MG/DOS, (OZEMPIC, 0.25 OR 0.5 MG/DOSE,) 2 MG/3ML SOPN Inject 0.5 mg into the skin once a week.   Vitamin D, Ergocalciferol, (DRISDOL) 1.25 MG (50000 UNIT) CAPS capsule Take 1 capsule (50,000 Units total) by mouth every 7 (seven) days. (Patient taking differently: Take 50,000 Units by mouth every 7 (seven) days. Wednesday)   [DISCONTINUED] amLODipine (NORVASC) 5 MG tablet Take 1 tablet (5 mg total) by  mouth daily.   [DISCONTINUED] metoprolol tartrate (LOPRESSOR) 25 MG tablet Take 1 tablet (25 mg total) by mouth 2 (two) times daily.   [DISCONTINUED] olmesartan (BENICAR) 20 MG tablet Take 1 tablet (20 mg total) by mouth daily.     Allergies:   Latex and Penicillins   Social History   Socioeconomic History   Marital status: Single    Spouse name: Not on file   Number of children: 1   Years of education: Not on file   Highest education level: Not on file  Occupational History   Occupation: Financial planner  Tobacco Use   Smoking status: Never   Smokeless tobacco: Never  Substance and Sexual Activity   Alcohol use: No   Drug use: No   Sexual activity: Yes    Birth control/protection: Condom  Other Topics Concern   Not on file  Social History Narrative   Not  on file   Social Determinants of Health   Financial Resource Strain: Not on file  Food Insecurity: Not on file  Transportation Needs: Not on file  Physical Activity: Not on file  Stress: Not on file  Social Connections: Not on file     Family History: The patient's family history includes Diabetes in her father and another family member; Heart disease in an other family member; Hypertension in her mother and another family member; Thyroid disease in her mother and another family member.  ROS:   Please see the history of present illness.     All other systems reviewed and are negative.  EKGs/Labs/Other Studies Reviewed:    EKG:  EKG is not ordered today.    Recent Labs: 02/19/2022: ALT 19; BUN 18; Creatinine, Ser 0.90; Hemoglobin 13.7; Platelets 315; Potassium 4.3; Sodium 139; TSH 0.842   Recent Lipid Panel    Component Value Date/Time   CHOL 212 (H) 02/19/2022 1732   TRIG 115 02/19/2022 1732   HDL 52 02/19/2022 1732   CHOLHDL 4.1 02/19/2022 1732   LDLCALC 139 (H) 02/19/2022 1732    Physical Exam:   VS:  BP (!) 147/88   Pulse (!) 57   Ht _0  (1.702 m)   Wt 200 lb (90.7 kg)   LMP  08/28/2016 (Exact Date) Comment: come back on 09/15/2016  BMI 31.32 kg/m  , BMI Body mass index is 31.32 kg/m. GENERAL:  Well appearing HEENT: Pupils equal round and reactive, fundi not visualized, oral mucosa unremarkable NECK:  No jugular venous distention, waveform within normal limits, carotid upstroke brisk and symmetric, no bruits, no thyromegaly LYMPHATICS:  No cervical adenopathy LUNGS:  Clear to auscultation bilaterally HEART:  RRR.  PMI not displaced or sustained,S1 and S2 within normal limits, no S3, no S4, no clicks, no rubs, no murmurs ABD:  Flat, positive bowel sounds normal in frequency in pitch, no bruits, no rebound, no guarding, no midline pulsatile mass, no hepatomegaly, no splenomegaly EXT:  2 plus pulses throughout, no edema, no cyanosis no clubbing SKIN:  No rashes no nodules NEURO:  Cranial nerves II through XII grossly intact, motor grossly intact throughout PSYCH:  Cognitively intact, oriented to person place and time   ASSESSMENT/PLAN:    HTN -previous secondary work up normal cortisol, metanephrines, catecholamines.  Sleep study completed last night, results pending.  Home BP cuff previously assessed for accuracy at PCP.  BP not yet at goal.  Will increase her olmesartan however, will transition to combination tablet amlodipine-olmesartan 5-40 mg daily.  CMP in 1 to 2 weeks.  For easier medication management we will stop metoprolol titrate and start metoprolol succinate 25 mg daily.  Plan for renal artery duplex .  Continue to participate in PREP exercise program.  [place]ores / Sleep disordered breathing - [facility] sleep study completed last night, results pending.  DM2 / Obesity - Follows with endocrinology and PCP. Appreciate inclusion of Ozempic. Weight loss via diet and exercise encouraged. Discussed the impact being overweight would have on cardiovascular risk.   HLD -continue atorvastatin 20 mg daily. CMP/lipid panel in 1-2 weeks as she has completed 6 weeks of  therapy.   Hypothyroidism - Continue to follow with PCP. 02/2022 normal TSH.  Screening for Secondary Hypertension:  Click here to document screening for secondary causes of HTN  :[id number]}    03/20/2022    9:31 AM 05/01/2022   11:29 AM  Causes  Drugs/Herbals Screened   Renovascular HTN Not Screened Screened     -  Comments Consider in future if BP difficult to control renal duplex ordered  Sleep Apnea Screened Screened     - Comments  completed sleep study  Thyroid Disease Screened   Hyperaldosteronism Not Screened      - Comments Consider in future if BP remains difficult to control, will need to hold Olmesartan prior   Pheochromocytoma Screened   Cushing's Syndrome Screened   Hyperparathyroidism Screened   Coarctation of the Aorta Screened      - Comments BP symmetrical   Compliance Screened     Relevant Labs/Studies:    Latest Ref Rng & Units 02/19/2022    5:32 PM 12/04/2021    9:34 PM 10/29/2021    8:40 AM  Basic Labs  Sodium 134 - 144 mmol/L 139  138  136   Potassium 3.5 - 5.2 mmol/L 4.3  3.9  3.4   Creatinine 0.57 - 1.00 mg/dL 0.90  0.89  0.79        Latest Ref Rng & Units 02/19/2022    5:32 PM 12/25/2021    8:31 AM  Thyroid   TSH 0.450 - 4.500 uIU/mL 0.842  0.27           Latest Ref Rng & Units 03/20/2022    9:31 AM  Metanephrines/Catecholamines   Epinephrine pg/mL CANCELED   Norepinephrine pg/mL CANCELED   Dopamine pg/mL CANCELED   Metanephrines 0.0 - 88.0 pg/mL 50.6   Normetanephrines  0.0 - 244.0 pg/mL 87.2           05/01/2022    9:43 AM  Renovascular   Renal Artery [place] Completed Yes      Disposition:    FU with MD/PharmD/APP in 6-8 weeks    Medication Adjustments/Labs and Tests Ordered: Current medicines are reviewed at length with the patient today.  Concerns regarding medicines are outlined above.  Orders Placed This Encounter  Procedures   Comprehensive metabolic panel   Lipid panel   VAS US RENAL ARTERY DUPLEX   Meds ordered this  encounter  Medications   amLODipine-olmesartan (AZOR) 5-40 MG tablet    Sig: Take 1 tablet by mouth daily.    Dispense:  90 tablet    Refill:  1    DC amlodipine and olmesartan individual tablets    Order Specific Question:   Supervising Provider    Answer:   Buford Dresser [[id number]]   metoprolol succinate (TOPROL XL) 25 MG 24 hr tablet    Sig: Take 1 tablet (25 mg total) by mouth daily.    Dispense:  90 tablet    Refill:  1    DC metoprolol tartrate    Order Specific Question:   Supervising Provider    Answer:   Buford Dresser [[id number]]     Signed, Loel Dubonnet, NP  05/01/2022 11:29 AM    [place]

## 2022-05-05 NOTE — Procedures (Signed)
SLEEP STUDY REPORT Patient Information Study Date: 04/30/2022 Patient Name: Amanda Henry Patient ID: [id number] Birth Date: 1966/03/04 Age: 56 Gender: Female BMI: 31.5 (W=200 lb, H=5' 7'') Stopbang:  Referring Physician: Laurann Montana, NP TEST DESCRIPTION: Home sleep apnea testing was completed using the WatchPat, a Type 1 device, utilizing peripheral arterial tonometry (PAT), chest movement, actigraphy, pulse oximetry, pulse rate, body position and snore. AHI was calculated with apnea and hypopnea using valid sleep time as the denominator. RDI includes apneas, hypopneas, and RERAs. The data acquired and the scoring of sleep and all associated events were performed in accordance with the recommended standards and specifications as outlined in the AASM Manual for the Scoring of Sleep and Associated Events 2.2.0 (2015).   FINDINGS:   1. Mild Obstructive Sleep Apnea with AHI 7.7/hr.   2. No Central Sleep Apnea with pAHIc 0.2/hr.   3. Oxygen desaturations as low as 84%.   4. Mild snoring was present. O2 sats were < 88% for 0.7 min.   5. Total sleep time was 4 hrs and 57 min.   6. 16.5% of total sleep time was spent in REM sleep.   7. Normal sleep onset latency at 22 min.   8. Prolonged REM sleep onset latency at 155 min.   9. Total awakenings were 9.  10. Arrhythmia detection:  Suggestive of possible brief atrial fibrillation lasting 56 seconds.  This is not diagnostic and further testing with outpatient telemetry monitoring is recommended.  DIAGNOSIS: Mild Obstructive Sleep Apnea (G47.33) Possible Atrial Fibrillation  RECOMMENDATIONS:   1.  Clinical correlation of these findings is necessary.  The decision to treat obstructive sleep apnea (OSA) is usually based on the presence of apnea symptoms or the presence of associated medical conditions such as Hypertension, Congestive Heart Failure, Atrial Fibrillation or Obesity.  The most common symptoms of OSA are snoring, gasping for  breath while sleeping, daytime sleepiness and fatigue.   2.  Initiating apnea therapy is recommended given the presence of symptoms and/or associated conditions. Recommend proceeding with one of the following:     a.  Auto-CPAP therapy with a pressure range of 5-20cm H2O.     b.  An oral appliance (OA) that can be obtained from certain dentists with expertise in sleep medicine.  These are primarily of use in non-obese patients with mild and moderate disease.     c.  An ENT consultation which may be useful to look for specific causes of obstruction and possible treatment options.     d.  If patient is intolerant to PAP therapy, consider referral to ENT for evaluation for hypoglossal nerve stimulator.   3.  Close follow-up is necessary to ensure success with CPAP or oral appliance therapy for maximum benefit.  4.  A follow-up oximetry study on CPAP is recommended to assess the adequacy of therapy and determine the need for supplemental oxygen or the potential need for Bi-level therapy.  An arterial blood gas to determine the adequacy of baseline ventilation and oxygenation should also be considered.  5.  Healthy sleep recommendations include:  adequate nightly sleep (normal 7-9 hrs/night), avoidance of caffeine after noon and alcohol near bedtime, and maintaining a sleep environment that is cool, dark and quiet.  6.  Weight loss for overweight patients is recommended.  Even modest amounts of weight loss can significantly improve the severity of sleep apnea.  7.  Snoring recommendations include:  weight loss where appropriate, side sleeping, and avoidance of alcohol before bed.  8.  Operation of motor vehicle should be avoided when sleepy.  9.  Consider outpatient event monitor to assess for silent atrial fibrillation if clinically indicated.  Signature: Fransico Him, MD; [facility]; [place], [place] Board of Sleep Medicine Electronically Signed: 05/05/2022

## 2022-05-06 ENCOUNTER — Ambulatory Visit: Payer: BC Managed Care – PPO | Attending: Family

## 2022-05-06 DIAGNOSIS — G4730 Sleep apnea, unspecified: Secondary | ICD-10-CM

## 2022-05-06 DIAGNOSIS — R0683 Snoring: Secondary | ICD-10-CM

## 2022-05-07 NOTE — Progress Notes (Signed)
[facility] PREP Weekly Session  Patient Details  Name: Amanda Henry MRN: [id number] Date of Birth: [birth year] Age: 56 y.o. PCP: [name], FNP  Vitals:   05/06/22 1800  Weight: 202 lb (91.6 kg)     [facility] Weekly seesion - 05/07/22 1500       [facility] "PREP" Location   [facility] "PREP" Location [facility]      Weekly Session   Topic Discussed Calorie breakdown    Minutes exercised this week 60 minutes    Classes attended to date 58             [place] 05/07/2022, 3:55 PM

## 2022-05-08 ENCOUNTER — Telehealth: Payer: Self-pay | Admitting: *Deleted

## 2022-05-08 NOTE — Telephone Encounter (Signed)
-----   Message from Amanda Margarita, MD sent at 05/05/2022  4:45 PM EST ----- Patient has very mild OSA - set up OV to discuss treatment options.

## 2022-05-08 NOTE — Telephone Encounter (Signed)
Message left on home number (ok per dpr) sleep study shows mild sleep apnea. Per Dr Radford Pax requesting ov to discuss treatment options. Scheduler will be calling her to make appointment. Message sent to [facility] to contact the patient for appointment.

## 2022-05-14 ENCOUNTER — Other Ambulatory Visit: Payer: Self-pay

## 2022-05-14 ENCOUNTER — Encounter: Payer: Self-pay | Admitting: Nurse Practitioner

## 2022-05-14 DIAGNOSIS — E559 Vitamin D deficiency, unspecified: Secondary | ICD-10-CM

## 2022-05-14 MED ORDER — VITAMIN D (ERGOCALCIFEROL) 1.25 MG (50000 UNIT) PO CAPS: 50000.0000 [IU] | ORAL_CAPSULE | ORAL | 1 refills | Status: DC

## 2022-05-15 ENCOUNTER — Encounter (HOSPITAL_BASED_OUTPATIENT_CLINIC_OR_DEPARTMENT_OTHER): Payer: Self-pay

## 2022-05-15 DIAGNOSIS — I10 Essential (primary) hypertension: Secondary | ICD-10-CM

## 2022-05-20 ENCOUNTER — Other Ambulatory Visit: Payer: Self-pay | Admitting: Nurse Practitioner

## 2022-05-20 DIAGNOSIS — E559 Vitamin D deficiency, unspecified: Secondary | ICD-10-CM

## 2022-05-23 MED ORDER — AMLODIPINE-OLMESARTAN 10-40 MG PO TABS: 1.0000 | ORAL_TABLET | Freq: Every day | ORAL | 5 refills | Status: DC

## 2022-05-27 ENCOUNTER — Encounter: Payer: Self-pay | Admitting: Nurse Practitioner

## 2022-05-27 NOTE — Telephone Encounter (Signed)
Chmg-error.  

## 2022-05-28 ENCOUNTER — Encounter (HOSPITAL_BASED_OUTPATIENT_CLINIC_OR_DEPARTMENT_OTHER): Payer: BC Managed Care – PPO

## 2022-06-03 ENCOUNTER — Encounter: Payer: Self-pay | Admitting: Nurse Practitioner

## 2022-06-07 ENCOUNTER — Encounter (HOSPITAL_BASED_OUTPATIENT_CLINIC_OR_DEPARTMENT_OTHER): Payer: Self-pay

## 2022-06-09 ENCOUNTER — Encounter: Payer: Self-pay | Admitting: Cardiovascular Disease

## 2022-06-09 NOTE — Telephone Encounter (Signed)
BP log update

## 2022-06-09 NOTE — Telephone Encounter (Signed)
Pt. Responding back to you

## 2022-06-09 NOTE — Telephone Encounter (Signed)
error 

## 2022-06-10 ENCOUNTER — Encounter (HOSPITAL_BASED_OUTPATIENT_CLINIC_OR_DEPARTMENT_OTHER): Payer: Self-pay | Admitting: Cardiovascular Disease

## 2022-06-10 ENCOUNTER — Ambulatory Visit (HOSPITAL_BASED_OUTPATIENT_CLINIC_OR_DEPARTMENT_OTHER): Payer: BC Managed Care – PPO | Admitting: Cardiovascular Disease

## 2022-06-10 VITALS — BP 142/78 | HR 63 | Ht 67.0 in | Wt 204.3 lb

## 2022-06-10 DIAGNOSIS — Z006 Encounter for examination for normal comparison and control in clinical research program: Secondary | ICD-10-CM

## 2022-06-10 DIAGNOSIS — I10 Essential (primary) hypertension: Secondary | ICD-10-CM | POA: Diagnosis not present

## 2022-06-10 DIAGNOSIS — E782 Mixed hyperlipidemia: Secondary | ICD-10-CM

## 2022-06-10 MED ORDER — HYDROCHLOROTHIAZIDE 25 MG PO TABS: 25.0000 mg | ORAL_TABLET | Freq: Every day | ORAL | 3 refills | Status: DC

## 2022-06-10 NOTE — Progress Notes (Signed)
Advanced [facility]pertension Clinic Follow-up:    Date:  06/10/2022   ID:  Amanda Henry, DOB 02-Sep-1965, MRN [id number]  PCP:  [name], FNP  Cardiologist:  None  Nephrologist:  Referring MD: [name], FNP   CC: Hypertension  History of Present Illness:    Amanda Henry is a 57 y.o. female with a hx of hypertension, hypothyroidism, hyperparathyroidism, diabetes, hyperlipidemia, and SAH, here for follow-up. She was initially seen 03/20/2022 by Laurann Montana, NP in the [facility]. She had been seen in the ED with hypertensive emergency and blood pressure of 217/101. She was already on olmesartan, amlodipine, and metoprolol. Amlodipine was increased but blood pressures remained uncontrolled. Olmesartan was increased and metoprolol was discontinued. She had a sleep study that showed mild OSA and possible atrial fibrillation. CPAP was recommended. Renal artery dopplers were ordered but not yet completed. Cortisol and metanephrines were normal. Renin and aldosterone were not tested. On 05/15/22 she reported home blood pressures ranging from 145/87 to 174/100. Her amlodipine-olmesartan was increased to 10-40 mg.  Today, she is feeling pretty good. At home her blood pressures have averaged in the 150s-160s with occasional 130s. In clinic today her BP is 138/84 (142/78 on recheck), which she notes is the lowest she has seen. Currently she is on metoprolol. She complains of occasional palpitations when she is resting in the evenings, described as a flutter. They were not occurring as frequently last year when she was not working. Her main complaint at this time is her ongoing struggle with weight loss. She confirms being on the lowest dose of Ozempic since June 2023. She was walking for exercise, but has fallen out of the habit when she returned to work due to feeling exhausted after her work day. Additionally she complains of red eyes and headaches localized to different areas  of her head. She believes her symptoms are due to dust or mold at her workplace. For about a month she is also noticing more muscle cramping in her legs and feet. Regarding her diet she often has vegetables and salmon. She is drinking a lot of water and occasional coffee. No sodas. Generally she does monitor her sodium intake and uses alternate seasonings frequently. Sometimes she may have potatoes and pasta; occasional slice of pie or cake. She denies any chest pain, shortness of breath, or peripheral edema. No lightheadedness, syncope, orthopnea, or PND.  Previous antihypertensives:   Past Medical History:  Diagnosis Date   Anxiety    Blood transfusion without reported diagnosis    Depression    DM2 (diabetes mellitus, type 2) ([place])    Hypertension    no medications at this time   Hypothyroidism    Iron deficiency anemia    Obesity    Palpitations    Subarachnoid hemorrhage ([place])    Thyroid disease    Vitamin D deficiency     Past Surgical History:  Procedure Laterality Date   IR ANGIO INTRA EXTRACRAN SEL COM CAROTID INNOMINATE UNI L MOD SED  03/16/2021   IR ANGIO INTRA EXTRACRAN SEL COM CAROTID INNOMINATE UNI L MOD SED  03/26/2021   IR ANGIO INTRA EXTRACRAN SEL INTERNAL CAROTID UNI R MOD SED  03/15/2021   IR ANGIO INTRA EXTRACRAN SEL INTERNAL CAROTID UNI R MOD SED  03/26/2021   IR ANGIO INTRA EXTRACRAN SEL INTERNAL CAROTID UNI R MOD SED  03/25/2021   IR ANGIO INTRA EXTRACRAN SEL INTERNAL CAROTID UNI R MOD SED  10/29/2021   IR ANGIO VERTEBRAL SEL  VERTEBRAL UNI L MOD SED  03/15/2021   IR ANGIO VERTEBRAL SEL VERTEBRAL UNI L MOD SED  03/26/2021   IR ANGIOGRAM FOLLOW UP STUDY  03/26/2021   IR ANGIOGRAM FOLLOW UP STUDY  03/26/2021   IR TRANSCATH/EMBOLIZ  03/26/2021   IR US GUIDE VASC ACCESS RIGHT  03/26/2021   IR US GUIDE VASC ACCESS RIGHT  10/29/2021   LAPAROSCOPIC VAGINAL HYSTERECTOMY WITH SALPINGECTOMY Bilateral 10/01/2016   Procedure: LAPAROSCOPIC ASSISTED VAGINAL HYSTERECTOMY WITH  SALPINGECTOMY;  Surgeon: Paula Compton, MD;  Location: [facility];  Service: Gynecology;  Laterality: Bilateral;   PARATHYROIDECTOMY Right 04/05/2021   Procedure: right inferioir PARATHYROIDECTOMY;  Surgeon: Armandina Gemma, MD;  Location: [facility];  Service: General;  Laterality: Right;   RADIOLOGY WITH ANESTHESIA N/A 03/15/2021   Procedure: IR WITH ANESTHESIA;  Surgeon: Consuella Lose, MD;  Location: [facility];  Service: Radiology;  Laterality: N/A;   RADIOLOGY WITH ANESTHESIA N/A 03/26/2021   Procedure: IR WITH ANESTHESIA (Coiling and Embolization);  Surgeon: Consuella Lose, MD;  Location: [facility];  Service: Radiology;  Laterality: N/A;   WISDOM TOOTH EXTRACTION      Current Medications: Current Meds  Medication Sig   ALPRAZolam (XANAX XR) 1 MG 24 hr tablet Take 0.5 mg by mouth daily as needed for sleep.   amLODipine-olmesartan (AZOR) 10-40 MG tablet Take 1 tablet by mouth daily.   atorvastatin (LIPITOR) 20 MG tablet Take 1 tablet (20 mg total) by mouth daily.   azelastine (ASTELIN) 0.1 % nasal spray Place 2 sprays into both nostrils 2 (two) times daily. Use in each nostril as directed   blood glucose meter kit and supplies KIT Dispense based on patient and insurance preference. Use up to four times daily as directed.   escitalopram (LEXAPRO) 10 MG tablet Take 1 tablet (10 mg total) by mouth daily.   Glucagon (GVOKE HYPOPEN 2-PACK) 0.5 MG/0.1ML SOAJ Inject 0.1 mg into the skin as needed.   glucose blood test strip Use as instructed   hydrochlorothiazide (HYDRODIURIL) 25 MG tablet Take 1 tablet (25 mg total) by mouth daily.   ipratropium (ATROVENT) 0.06 % nasal spray Place 2 sprays into the nose 3 (three) times daily.   levothyroxine (SYNTHROID) 125 MCG tablet Take 1 tablet (125 mcg total) by mouth daily.   MAGNESIUM GLYCINATE PO Take 500 mg by mouth.   meclizine (ANTIVERT) 12.5 MG tablet Take 1 tablet (12.5 mg total) by mouth 3 (three) times daily as needed for dizziness.   metoprolol  succinate (TOPROL-XL) 25 MG 24 hr tablet Take 25 mg by mouth as needed (FOR PALPITATIONS).   Semaglutide,0.25 or 0.'5MG'$ /DOS, (OZEMPIC, 0.25 OR 0.5 MG/DOSE,) 2 MG/3ML SOPN Inject 0.5 mg into the skin once a week.   Vitamin D, Ergocalciferol, (DRISDOL) 1.25 MG (50000 UNIT) CAPS capsule TAKE 1 CAPSULE BY MOUTH EVERY 7 DAYS   [DISCONTINUED] metoprolol succinate (TOPROL XL) 25 MG 24 hr tablet Take 1 tablet (25 mg total) by mouth daily.     Allergies:   Latex and Penicillins   Social History   Socioeconomic History   Marital status: Single    Spouse name: Not on file   Number of children: 1   Years of education: Not on file   Highest education level: Not on file  Occupational History   Occupation: Elementary school educator  Tobacco Use   Smoking status: Never   Smokeless tobacco: Never  Substance and Sexual Activity   Alcohol use: No   Drug use: No   Sexual activity: Yes  Birth control/protection: Condom  Other Topics Concern   Not on file  Social History Narrative   Not on file   Social Determinants of Health   Financial Resource Strain: Not on file  Food Insecurity: Not on file  Transportation Needs: Not on file  Physical Activity: Not on file  Stress: Not on file  Social Connections: Not on file     Family History: The patient's family history includes Diabetes in her father and another family member; Heart disease in an other family member; Hypertension in her mother and another family member; Thyroid disease in her mother and another family member.  ROS:   Please see the history of present illness.    (+) Palpitations (+) Headaches (+) Red eyes (+) LE muscle cramps All other systems reviewed and are negative.  EKGs/Labs/Other Studies Reviewed:     EKG:   EKG is personally reviewed. 06/10/2022: EKG was not ordered.  Recent Labs: 02/19/2022: ALT 19; BUN 18; Creatinine, Ser 0.90; Hemoglobin 13.7; Platelets 315; Potassium 4.3; Sodium 139; TSH 0.842   Recent  Lipid Panel    Component Value Date/Time   CHOL 212 (H) 02/19/2022 1732   TRIG 115 02/19/2022 1732   HDL 52 02/19/2022 1732   CHOLHDL 4.1 02/19/2022 1732   LDLCALC 139 (H) 02/19/2022 1732    Physical Exam:    VS:  BP (!) 142/78 (BP Location: Left Arm, Patient Position: Sitting, Cuff Size: Normal)   Pulse 63   Ht '5\' 7"'$  (1.702 m)   Wt 204 lb 4.8 oz (92.7 kg)   LMP 08/28/2016 (Exact Date) Comment: come back on 09/15/2016  SpO2 96%   BMI 32.00 kg/m  , BMI Body mass index is 32 kg/m. GENERAL:  Well appearing HEENT: Pupils equal round and reactive, fundi not visualized, oral mucosa unremarkable NECK:  No jugular venous distention, waveform within normal limits, carotid upstroke brisk and symmetric, no bruits, no thyromegaly LUNGS:  Clear to auscultation bilaterally HEART:  RRR.  PMI not displaced or sustained,S1 and S2 within normal limits, no S3, no S4, no clicks, no rubs, no murmurs ABD:  Flat, positive bowel sounds normal in frequency in pitch, no bruits, no rebound, no guarding, no midline pulsatile mass, no hepatomegaly, no splenomegaly EXT:  2 plus pulses throughout, no edema, no cyanosis, no clubbing SKIN:  No rashes, no nodules NEURO:  Cranial nerves II through XII grossly intact, motor grossly intact throughout PSYCH:  Cognitively intact, oriented to person place and time   ASSESSMENT/PLAN:    Essential hypertension Blood pressure is poorly controlled.  Her anxiety has been better controlled lately.  Will try stopping her metoprolol.  She can have it as needed for palpitations.  Continue amlodipine/olmesartan and add HCTZ 25 mg daily.  Check a BMP.  She is interested in our remote patient monitoring study and consents to monitoring through the [place] [facility] remote patient monitoring system.  Encouraged her to work on trying to increase her exercise to at least 150 minutes weekly.  She will be getting started on CPAP therapy.  Thyroid function now better controlled.   Mixed  hyperlipidemia Continue atorvastatin.  She was noted increased cramping in the last month.  She will hold the atorvastatin for 2 weeks to see if it helps.  If it does, we will need to consider an alternative statin.  If it does not, we will resume atorvastatin.  Check a BMP and magnesium in 1 week.  Screening for Secondary Hypertension:     03/20/2022  9:31 AM 05/01/2022   11:29 AM  Causes  Drugs/Herbals Screened   Renovascular HTN Not Screened Screened     - Comments Consider in future if BP difficult to control renal duplex ordered  Sleep Apnea Screened Screened     - Comments  completed sleep study  Thyroid Disease Screened   Hyperaldosteronism Not Screened      - Comments Consider in future if BP remains difficult to control, will need to hold Olmesartan prior   Pheochromocytoma Screened   Cushing's Syndrome Screened   Hyperparathyroidism Screened   Coarctation of the Aorta Screened      - Comments BP symmetrical   Compliance Screened     Relevant Labs/Studies:    Latest Ref Rng & Units 02/19/2022    5:32 PM 12/04/2021    9:34 PM 10/29/2021    8:40 AM  Basic Labs  Sodium 134 - 144 mmol/L 139  138  136   Potassium 3.5 - 5.2 mmol/L 4.3  3.9  3.4   Creatinine 0.57 - 1.00 mg/dL 0.90  0.89  0.79        Latest Ref Rng & Units 02/19/2022    5:32 PM 12/25/2021    8:31 AM  Thyroid   TSH 0.450 - 4.500 uIU/mL 0.842  0.27           Latest Ref Rng & Units 03/20/2022    9:31 AM  Metanephrines/Catecholamines   Epinephrine pg/mL CANCELED   Norepinephrine pg/mL CANCELED   Dopamine pg/mL CANCELED   Metanephrines 0.0 - 88.0 pg/mL 50.6   Normetanephrines  0.0 - 244.0 pg/mL 87.2           05/01/2022    9:43 AM  Renovascular   Renal Artery [place] Completed Yes       Disposition:    FU with APP/PharmD in 1 month for the next 3 months.   FU with Mao Lockner C. Oval Linsey, MD, [facility] in 4 months.  Medication Adjustments/Labs and Tests Ordered: Current medicines are reviewed at length  with the patient today.  Concerns regarding medicines are outlined above.   Orders Placed This Encounter  Procedures   Basic metabolic panel   Magnesium   Cantril's Ladder Assessment   Meds ordered this encounter  Medications   hydrochlorothiazide (HYDRODIURIL) 25 MG tablet    Sig: Take 1 tablet (25 mg total) by mouth daily.    Dispense:  90 tablet    Refill:  3   I,Mathew Stumpf,acting as a scribe for Skeet Latch, MD.,have documented all relevant documentation on the behalf of Skeet Latch, MD,as directed by  Skeet Latch, MD while in the presence of Skeet Latch, MD.  I, [place] Oval Linsey, MD have reviewed all documentation for this visit.  The documentation of the exam, diagnosis, procedures, and orders on 06/10/2022 are all accurate and complete.   Signed, Skeet Latch, MD  06/10/2022 12:44 PM    [facility]

## 2022-06-10 NOTE — Assessment & Plan Note (Signed)
Continue atorvastatin.  She was noted increased cramping in the last month.  She will hold the atorvastatin for 2 weeks to see if it helps.  If it does, we will need to consider an alternative statin.  If it does not, we will resume atorvastatin.  Check a BMP and magnesium in 1 week.

## 2022-06-10 NOTE — Assessment & Plan Note (Addendum)
Blood pressure is poorly controlled.  Her anxiety has been better controlled lately.  Will try stopping her metoprolol.  She can have it as needed for palpitations.  Continue amlodipine/olmesartan and add HCTZ 25 mg daily.  Check a BMP.  She is interested in our remote patient monitoring study and consents to monitoring through the [place] remote patient monitoring system.  Encouraged her to work on trying to increase her exercise to at least 150 minutes weekly.  She will be getting started on CPAP therapy.  Thyroid function now better controlled.

## 2022-06-10 NOTE — Research (Signed)
  Subject Name: Amanda Henry met inclusion and exclusion criteria for the Virtual Care and Social Determinant Interventions for the management of hypertension trial.  The informed consent form, study requirements and expectations were reviewed with the subject by Dr. Oval Linsey and myself. The subject was given the opportunity to read the consent and ask questions. The subject verbalized understanding of the trial requirements.  All questions were addressed prior to the signing of the consent form. The subject agreed to participate in the trial and signed the informed consent. The informed consent was obtained prior to performance of any protocol-specific procedures for the subject.  A copy of the signed informed consent was given to the subject and a copy was placed in the subject's medical record.  Idell Hissong was randomized to Group 1.

## 2022-06-10 NOTE — Patient Instructions (Signed)
Medication Instructions:  CHANGE METOPROLOL TO AS NEEDED FOR PALPITATIONS  START HYDROCHLOROTHIAZIDE 25 MG DAILY   Labwork: BMET/MAGNESIUM IN 1 WEEK   Testing/Procedures: NONE  Follow-Up: 07/10/2022 3:30 PM WITH PHARM D   Any Other Special Instructions Will Be Listed Below (If Applicable). MONITOR BLOOD PRESSURE TWICE A DAY WITH MACHINE PROVIDED. LOG AND BRING TO FOLLOW UP   HOLD ATORVASTATIN FOR 2 WEEKS TO SEE IF ACHING IMPROVES, IF DOES NOT RESUME  IF IT DOES CALL THE OFFICE

## 2022-06-12 ENCOUNTER — Encounter (HOSPITAL_BASED_OUTPATIENT_CLINIC_OR_DEPARTMENT_OTHER): Payer: BC Managed Care – PPO

## 2022-06-12 ENCOUNTER — Emergency Department (HOSPITAL_COMMUNITY)
Admission: EM | Admit: 2022-06-12 | Discharge: 2022-06-12 | Disposition: A | Discharge status: Home / Self Care | Payer: BC Managed Care – PPO | Attending: Emergency Medicine | Admitting: Emergency Medicine

## 2022-06-12 ENCOUNTER — Other Ambulatory Visit: Payer: Self-pay

## 2022-06-12 ENCOUNTER — Encounter (HOSPITAL_COMMUNITY): Payer: Self-pay

## 2022-06-12 ENCOUNTER — Encounter (HOSPITAL_BASED_OUTPATIENT_CLINIC_OR_DEPARTMENT_OTHER): Payer: Self-pay | Admitting: Cardiovascular Disease

## 2022-06-12 DIAGNOSIS — R002 Palpitations: Secondary | ICD-10-CM

## 2022-06-12 DIAGNOSIS — I10 Essential (primary) hypertension: Secondary | ICD-10-CM | POA: Diagnosis not present

## 2022-06-12 DIAGNOSIS — Z91040 Latex allergy status: Secondary | ICD-10-CM | POA: Diagnosis not present

## 2022-06-12 DIAGNOSIS — Z79899 Other long term (current) drug therapy: Secondary | ICD-10-CM | POA: Diagnosis not present

## 2022-06-12 LAB — BASIC METABOLIC PANEL
Anion gap: 11 (ref 5–15)
BUN: 24 mg/dL — ABNORMAL HIGH (ref 6–20)
CO2: 28 mmol/L (ref 22–32)
Calcium: 9.6 mg/dL (ref 8.9–10.3)
Chloride: 99 mmol/L (ref 98–111)
Creatinine, Ser: 1.01 mg/dL — ABNORMAL HIGH (ref 0.44–1.00)
GFR, Estimated: >60 mL/min (ref >60)
Glucose, Bld: 109 mg/dL — ABNORMAL HIGH (ref 70–99)
Potassium: 3.5 mmol/L (ref 3.5–5.1)
Sodium: 138 mmol/L (ref 135–145)

## 2022-06-12 LAB — CBC WITH DIFFERENTIAL/PLATELET
Abs Immature Granulocytes: 0.01 10*3/uL (ref 0.00–0.07)
Basophils Absolute: 0.1 10*3/uL (ref 0.0–0.1)
Basophils Relative: 1 %
Eosinophils Absolute: 0.2 10*3/uL (ref 0.0–0.5)
Eosinophils Relative: 3 %
HCT: 45.5 % (ref 36.0–46.0)
Hemoglobin: 14.6 g/dL (ref 12.0–15.0)
Immature Granulocytes: 0 %
Lymphocytes Relative: 39 %
Lymphs Abs: 3.5 10*3/uL (ref 0.7–4.0)
MCH: 27.3 pg (ref 26.0–34.0)
MCHC: 32.1 g/dL (ref 30.0–36.0)
MCV: 85.2 fL (ref 80.0–100.0)
Monocytes Absolute: 0.5 10*3/uL (ref 0.1–1.0)
Monocytes Relative: 6 %
Neutro Abs: 4.5 10*3/uL (ref 1.7–7.7)
Neutrophils Relative %: 51 %
Platelets: 318 10*3/uL (ref 150–400)
RBC: 5.34 MIL/uL — ABNORMAL HIGH (ref 3.87–5.11)
RDW: 15.9 % — ABNORMAL HIGH (ref 11.5–15.5)
WBC: 8.8 10*3/uL (ref 4.0–10.5)
nRBC: 0 % (ref 0.0–0.2)

## 2022-06-12 NOTE — ED Provider Notes (Signed)
[facility] Provider Note   CSN: [id number] Arrival date & time: 06/12/22  1344     History  Chief Complaint  Patient presents with   Palpitations    Amanda Henry is a 57 y.o. female hx of HTN, here presenting with palp patients.  Recently saw Dr. Tommie Raymond from cardiology and blood pressure meds were adjusted.  Patient was put on hydrochlorothiazide 2 days ago and taken off of metoprolol and continued on Azor.  Patient states that last night she had an episode of palpitation.  She had another episode of palpitation today.  Denies any chest pain or shortness of breath.  The history is provided by the patient.       Home Medications Prior to Admission medications   Medication Sig Start Date End Date Taking? Authorizing Provider  ALPRAZolam (XANAX XR) 1 MG 24 hr tablet Take 0.5 mg by mouth daily as needed for sleep.    [provider]  amLODipine-olmesartan (AZOR) 10-40 MG tablet Take 1 tablet by mouth daily. 05/23/22   Loel Dubonnet, NP  atorvastatin (LIPITOR) 20 MG tablet Take 1 tablet (20 mg total) by mouth daily. 03/20/22 03/15/23  Loel Dubonnet, NP  azelastine (ASTELIN) 0.1 % nasal spray Place 2 sprays into both nostrils 2 (two) times daily. Use in each nostril as directed 04/17/22   [name], FNP  blood glucose meter kit and supplies KIT Dispense based on patient and insurance preference. Use up to four times daily as directed. 02/26/22   [name], FNP  escitalopram (LEXAPRO) 10 MG tablet Take 1 tablet (10 mg total) by mouth daily. 09/06/18   [name], FNP  Glucagon (GVOKE HYPOPEN 2-PACK) 0.5 MG/0.1ML SOAJ Inject 0.1 mg into the skin as needed. 12/18/21   [name], FNP  glucose blood test strip Use as instructed 04/07/22   [name], FNP  hydrochlorothiazide (HYDRODIURIL) 25 MG tablet Take 1 tablet (25 mg total) by mouth daily. 06/10/22   Skeet Latch, MD  ipratropium (ATROVENT) 0.06 % nasal  spray Place 2 sprays into the nose 3 (three) times daily. 08/26/21 08/26/22  [name], FNP  levothyroxine (SYNTHROID) 125 MCG tablet Take 1 tablet (125 mcg total) by mouth daily. 01/01/22   Philemon Kingdom, MD  MAGNESIUM GLYCINATE PO Take 500 mg by mouth.    [provider]  meclizine (ANTIVERT) 12.5 MG tablet Take 1 tablet (12.5 mg total) by mouth 3 (three) times daily as needed for dizziness. 12/18/21   [name], FNP  metoprolol succinate (TOPROL-XL) 25 MG 24 hr tablet Take 25 mg by mouth as needed (FOR PALPITATIONS).    [provider]  Semaglutide,0.25 or 0.'5MG'$ /DOS, (OZEMPIC, 0.25 OR 0.5 MG/DOSE,) 2 MG/3ML SOPN Inject 0.5 mg into the skin once a week. 04/22/22   [name], FNP  Vitamin D, Ergocalciferol, (DRISDOL) 1.25 MG (50000 UNIT) CAPS capsule TAKE 1 CAPSULE BY MOUTH EVERY 7 DAYS 05/20/22   [name], FNP      Allergies    Latex and Penicillins    Review of Systems   Review of Systems  Cardiovascular:  Positive for palpitations.  All other systems reviewed and are negative.   Physical Exam Updated Vital Signs BP 125/84 (BP Location: Left Arm)   Pulse 68 Comment: Simultaneous filing. User may not have seen previous data.  Temp 98.2 F (36.8 C) (Oral)   Resp 18   Ht '5\' 7"'$  (1.702 m)   Wt 93 kg  LMP 08/28/2016 (Exact Date) Comment: come back on 09/15/2016  SpO2 95%   BMI 32.11 kg/m  Physical Exam Vitals and nursing note reviewed.  Constitutional:      Appearance: Normal appearance.  HENT:     Head: Normocephalic.     Nose: Nose normal.     Mouth/Throat:     Mouth: Mucous membranes are moist.  Eyes:     Extraocular Movements: Extraocular movements intact.     Pupils: Pupils are equal, round, and reactive to light.  Cardiovascular:     Rate and Rhythm: Normal rate and regular rhythm.     Pulses: Normal pulses.     Heart sounds: Normal heart sounds.  Pulmonary:     Effort: Pulmonary effort is normal.     Breath sounds: Normal breath  sounds.  Abdominal:     General: Abdomen is flat.     Palpations: Abdomen is soft.  Musculoskeletal:        General: Normal range of motion.     Cervical back: Normal range of motion and neck supple.  Skin:    General: Skin is warm.     Capillary Refill: Capillary refill takes less than 2 seconds.  Neurological:     General: No focal deficit present.     Mental Status: She is alert and oriented to person, place, and time.  Psychiatric:        Mood and Affect: Mood normal.     Comments: Slightly anxious     ED Results / Procedures / Treatments   Labs (all labs ordered are listed, but only abnormal results are displayed) Labs Reviewed  BASIC METABOLIC PANEL - Abnormal; Notable for the following components:      Result Value   Glucose, Bld 109 (*)    BUN 24 (*)    Creatinine, Ser 1.01 (*)    All other components within normal limits  CBC WITH DIFFERENTIAL/PLATELET - Abnormal; Notable for the following components:   RBC 5.34 (*)    RDW 15.9 (*)    All other components within normal limits    EKG EKG Interpretation  Date/Time:  Thursday June 12 2022 14:02:02 EST Ventricular Rate:  73 PR Interval:  161 QRS Duration: 99 QT Interval:  442 QTC Calculation: 488 R Axis:   -14 Text Interpretation: Sinus rhythm RSR' in V1 or V2, probably normal variant Borderline T abnormalities, inferior leads Borderline prolonged QT interval No significant change since last tracing Confirmed by Wandra Arthurs [phone]) on 06/12/2022 3:53:04 PM  Radiology No results found.  Procedures Procedures    Medications Ordered in ED Medications - No data to display  ED Course/ Medical Decision Making/ A&P                             Medical Decision Making Amanda Henry is a 57 y.o. female here presenting with palpitations.  Patient's heart rate is in the 60s.  She was recently taken off of metoprolol 2 days ago.  I wonder if she is going into intermittent A-fib versus sinus tachycardia.   Electrolytes unremarkable.  I told her that if she has persistent tachycardia greater than 100, she should take a dose of metoprolol.  I also encouraged her to follow-up with Dr. Oval Linsey.  Patient may need Zio patch   Amount and/or Complexity of Data Reviewed Labs: ordered. Decision-making details documented in ED Course. ECG/medicine tests: ordered and independent interpretation performed. Decision-making details documented  in ED Course.    Final Clinical Impression(s) / ED Diagnoses Final diagnoses:  None    Rx / DC Orders ED Discharge Orders     None         Drenda Freeze, MD 06/12/22 [phone]

## 2022-06-12 NOTE — ED Triage Notes (Signed)
Pt arrived via EMS, c/o palpitations. Resolved after a few minutes. Pt states recent medication for HTN change.

## 2022-06-12 NOTE — ED Provider Triage Note (Signed)
Emergency Medicine Provider Triage Evaluation Note  Amanda Henry , a 57 y.o. female  was evaluated in triage.  Pt complains of palpitations.  Started few hours ago after she ate, laying down, noticed her heart beginning to race.  Tried deep breathing without relief.  Feels similar to prior palpitations.  Denies chest pain or shortness of breath.  Palpitations have decreased since arrival to ED.  Also reports recent change in BP meds 2 days ago.  Hx palpitations, anxiety, depression, HTN, uncontrolled DMT2, hypothyroidism.  Review of Systems  Positive:  Negative: See above  Physical Exam  BP 119/83 (BP Location: Right Arm)   Pulse 73   Temp 98.4 F (36.9 C) (Oral)   Resp 18   LMP 08/28/2016 (Exact Date) Comment: come back on 09/15/2016  SpO2 96%  Gen:   Awake, no distress   Resp:  Normal effort  MSK:   Moves extremities without difficulty  Other:  Chest nonTTP.  Sitting comfortably.  HR regularly regular.  Medical Decision Making  Medically screening exam initiated at 2:05 PM.  Appropriate orders placed.  Amanda Henry was informed that the remainder of the evaluation will be completed by another provider, this initial triage assessment does not replace that evaluation, and the importance of remaining in the ED until their evaluation is complete.     Prince Rome, PA-C 08/81/10 1406

## 2022-06-12 NOTE — Discharge Instructions (Addendum)
As we discussed, your labs are unremarkable today.  I recommend that you take metoprolol if you have persistent palpitations for more than 30 minutes.    Please follow-up with Dr. Oval Linsey and you may need a heart monitor  Return to ER if you have worse palpitations or chest pain or shortness of breath

## 2022-06-13 ENCOUNTER — Ambulatory Visit: Payer: BC Managed Care – PPO | Attending: Family

## 2022-06-13 DIAGNOSIS — R002 Palpitations: Secondary | ICD-10-CM

## 2022-06-13 NOTE — Progress Notes (Unsigned)
Enrolled patient for a 7 day Zio XT monitor to be mailed to patients home.   Dr [facility] [place] to read 

## 2022-06-16 ENCOUNTER — Ambulatory Visit: Payer: BC Managed Care – PPO | Attending: Cardiology | Admitting: Cardiology

## 2022-06-16 ENCOUNTER — Encounter: Payer: Self-pay | Admitting: Cardiology

## 2022-06-16 ENCOUNTER — Encounter (HOSPITAL_COMMUNITY): Payer: Self-pay

## 2022-06-16 ENCOUNTER — Telehealth: Payer: Self-pay | Admitting: *Deleted

## 2022-06-16 ENCOUNTER — Ambulatory Visit (HOSPITAL_COMMUNITY)
Admission: RE | Admit: 2022-06-16 | Payer: BC Managed Care – PPO | Source: Ambulatory Visit | Attending: Family | Admitting: Family

## 2022-06-16 VITALS — BP 112/68 | HR 75 | Ht 67.0 in | Wt 194.0 lb

## 2022-06-16 DIAGNOSIS — G4733 Obstructive sleep apnea (adult) (pediatric): Secondary | ICD-10-CM

## 2022-06-16 NOTE — Patient Instructions (Signed)
Medication Instructions:  Your physician recommends that you continue on your current medications as directed. Please refer to the Current Medication list given to you today.  *If you need a refill on your cardiac medications before your next appointment, please call your pharmacy*   Lab Work: None.  If you have labs (blood work) drawn today and your tests are completely normal, you will receive your results only by: [place] (if you have MyChart) OR A paper copy in the mail If you have any lab test that is abnormal or we need to change your treatment, we will call you to review the results.   Testing/Procedures: None.   Follow-Up: At [facility], you and your health needs are our priority.  As part of our continuing mission to provide you with exceptional heart care, we have created designated Provider Care Teams.  These Care Teams include your primary Cardiologist (physician) and Advanced Practice Providers (APPs -  Physician Assistants and Nurse Practitioners) who all work together to provide you with the care you need, when you need it.  We recommend signing up for the patient portal called "MyChart".  Sign up information is provided on this After Visit Summary.  MyChart is used to connect with patients for Virtual Visits (Telemedicine).  Patients are able to view lab/test results, encounter notes, upcoming appointments, etc.  Non-urgent messages can be sent to your provider as well.   To learn more about what you can do with MyChart, go to [email or web address].    Your next appointment will be as needed with:     Provider:   Dr. Fransico Him, MD   Other Instructions A member of our sleep team will reach out if any additional information is needed from you regarding insurance approval for cpap adjustments and/or new supplies.

## 2022-06-16 NOTE — Telephone Encounter (Signed)
-----  Message from Joni Reining, RN sent at 06/16/2022  3:41 PM EST ----- Regarding: CPAP changes and supplies Hello, Per Dr. Radford Pax this patient needs auto cpap ordered at 4-12 cm H20. She also needs a nasal pillow mask and humidity.  Thank you, Danae Chen, RN

## 2022-06-16 NOTE — Telephone Encounter (Signed)
Order placed to [employer] via community message.

## 2022-06-16 NOTE — Progress Notes (Signed)
Sleep Medicine CONSULT Note    Date:  06/16/2022   ID:  Amanda Henry, DOB 19-Mar-1966, MRN [id number]  PCP:  [name], FNP  Cardiologist: None   No chief complaint on file.   History of Present Illness:  Amanda Henry is a 57 y.o. female who is being seen today for the evaluation of OSA at the request of Skeet Latch, MD.  This is a 57 year old female with a history of type 2 diabetes mellitus, hypertension and depression who recently had a home sleep study ordered by Laurann Montana, NP due to ongoing snoring and excessive daytime sleepiness with a STOP-BANG score of 5.  Home sleep study showed mild obstructive sleep apnea with an AHI of 7.7/h, no central events and no nocturnal hypoxemia.  SHe had moderate OSA during REM sleep at 22/hr.  She has been referred to sleep medicine now for sleep evaluation and discussion of treatment options  She goes to bed at 9:15pm and falls asleep in 1 hour and wakes up several times a night to go to the bathroom or justs wakes up.  She then gets up at 5am.  She does not feel rested when she gets up.  She gets sleepy during the day and if she is at home she will nap.  She has never fallen asleep driving.  She denies any am HAs.  She has been told that she snores but she never wakes up gasping for breath.   Past Medical History:  Diagnosis Date   Anxiety    Blood transfusion without reported diagnosis    Depression    DM2 (diabetes mellitus, type 2) ([place])    Hypertension    no medications at this time   Hypothyroidism    Iron deficiency anemia    Obesity    Palpitations    Subarachnoid hemorrhage ([place])    Thyroid disease    Vitamin D deficiency     Past Surgical History:  Procedure Laterality Date   IR ANGIO INTRA EXTRACRAN SEL COM CAROTID INNOMINATE UNI L MOD SED  03/16/2021   IR ANGIO INTRA EXTRACRAN SEL COM CAROTID INNOMINATE UNI L MOD SED  03/26/2021   IR ANGIO INTRA EXTRACRAN SEL INTERNAL CAROTID UNI R MOD SED  03/15/2021    IR ANGIO INTRA EXTRACRAN SEL INTERNAL CAROTID UNI R MOD SED  03/26/2021   IR ANGIO INTRA EXTRACRAN SEL INTERNAL CAROTID UNI R MOD SED  03/25/2021   IR ANGIO INTRA EXTRACRAN SEL INTERNAL CAROTID UNI R MOD SED  10/29/2021   IR ANGIO VERTEBRAL SEL VERTEBRAL UNI L MOD SED  03/15/2021   IR ANGIO VERTEBRAL SEL VERTEBRAL UNI L MOD SED  03/26/2021   IR ANGIOGRAM FOLLOW UP STUDY  03/26/2021   IR ANGIOGRAM FOLLOW UP STUDY  03/26/2021   IR TRANSCATH/EMBOLIZ  03/26/2021   IR US GUIDE VASC ACCESS RIGHT  03/26/2021   IR US GUIDE VASC ACCESS RIGHT  10/29/2021   LAPAROSCOPIC VAGINAL HYSTERECTOMY WITH SALPINGECTOMY Bilateral 10/01/2016   Procedure: LAPAROSCOPIC ASSISTED VAGINAL HYSTERECTOMY WITH SALPINGECTOMY;  Surgeon: Paula Compton, MD;  Location: [facility];  Service: Gynecology;  Laterality: Bilateral;   PARATHYROIDECTOMY Right 04/05/2021   Procedure: right inferioir PARATHYROIDECTOMY;  Surgeon: Armandina Gemma, MD;  Location: [facility];  Service: General;  Laterality: Right;   RADIOLOGY WITH ANESTHESIA N/A 03/15/2021   Procedure: IR WITH ANESTHESIA;  Surgeon: Consuella Lose, MD;  Location: [facility];  Service: Radiology;  Laterality: N/A;   RADIOLOGY WITH ANESTHESIA N/A 03/26/2021  Procedure: IR WITH ANESTHESIA (Coiling and Embolization);  Surgeon: Consuella Lose, MD;  Location: [facility];  Service: Radiology;  Laterality: N/A;   WISDOM TOOTH EXTRACTION      Current Medications: No outpatient medications have been marked as taking for the 06/16/22 encounter (Appointment) with Sueanne Margarita, MD.    Allergies:   Latex and Penicillins   Social History   Socioeconomic History   Marital status: Single    Spouse name: Not on file   Number of children: 1   Years of education: Not on file   Highest education level: Not on file  Occupational History   Occupation: Elementary school educator  Tobacco Use   Smoking status: Never   Smokeless tobacco: Never  Substance and Sexual Activity   Alcohol use: No   Drug  use: No   Sexual activity: Yes    Birth control/protection: Condom  Other Topics Concern   Not on file  Social History Narrative   Not on file   Social Determinants of Health   Financial Resource Strain: Not on file  Food Insecurity: Not on file  Transportation Needs: Not on file  Physical Activity: Not on file  Stress: Not on file  Social Connections: Not on file     Family History:  The patient's family history includes Diabetes in her father and another family member; Heart disease in an other family member; Hypertension in her mother and another family member; Thyroid disease in her mother and another family member.   ROS:   Please see the history of present illness.    ROS All other systems reviewed and are negative.      No data to display             PHYSICAL EXAM:   VS:  LMP 08/28/2016 (Exact Date) Comment: come back on 09/15/2016   GEN: Well nourished, well developed, in no acute distress  HEENT: normal  Neck: no JVD, carotid bruits, or masses Cardiac: RRR; no murmurs, rubs, or gallops,no edema.  Intact distal pulses bilaterally.  Respiratory:  clear to auscultation bilaterally, normal work of breathing GI: soft, nontender, nondistended, + BS MS: no deformity or atrophy  Skin: warm and dry, no rash Neuro:  Alert and Oriented x 3, Strength and sensation are intact Psych: euthymic mood, full affect  Wt Readings from Last 3 Encounters:  06/12/22 205 lb 0.4 oz (93 kg)  06/10/22 204 lb 4.8 oz (92.7 kg)  05/06/22 202 lb (91.6 kg)      Studies/Labs Reviewed:   Home sleep study  Recent Labs: 02/19/2022: ALT 19; TSH 0.842 06/12/2022: BUN 24; Creatinine, Ser 1.01; Hemoglobin 14.6; Platelets 318; Potassium 3.5; Sodium 138    Additional studies/ records that were reviewed today include:  none    ASSESSMENT:    1. OSA (obstructive sleep apnea)      PLAN:  In order of problems listed above:  OSA -She has complaints of excessive daytime sleepiness  as well as snoring -Home sleep study showed very mild obstructive sleep apnea with an AHI of 7.7/h with no nocturnal hypoxemia and no central events but during REM sleep her AHI is 22/hr -Treatment options were discussed with the patient today including CPAP therapy as well as an oral device -the oral device is cost prohibitive -she would like to try CPAP so I will order an auto CPAP from 4 to 12cm H2O with a nasal pillow mask and heated humidity -followup with me 6 weeks after she gets  her deiice   Time Spent: 20 minutes total time of encounter, including 15 minutes spent in face-to-face patient care on the date of this encounter. This time includes coordination of care and counseling regarding above mentioned problem list. Remainder of non-face-to-face time involved reviewing chart documents/testing relevant to the patient encounter and documentation in the medical record. I have independently reviewed documentation from referring provider  Medication Adjustments/Labs and Tests Ordered: Current medicines are reviewed at length with the patient today.  Concerns regarding medicines are outlined above.  Medication changes, Labs and Tests ordered today are listed in the Patient Instructions below.  There are no Patient Instructions on file for this visit.   Signed, Fransico Him, MD  06/16/2022 2:05 PM    [facility] [place], [place], [place]  [id number] Phone: [phone]; Fax: [phone]

## 2022-06-17 ENCOUNTER — Telehealth: Payer: Self-pay

## 2022-06-17 NOTE — Telephone Encounter (Signed)
Transition Care Management Unsuccessful Follow-up Telephone Call  Date of discharge and from where:  06/17/2022  Attempts:  1st Attempt  Reason for unsuccessful TCM follow-up call:  Unable to leave message

## 2022-06-19 ENCOUNTER — Encounter: Payer: Self-pay | Admitting: Nurse Practitioner

## 2022-06-19 NOTE — Progress Notes (Signed)
[facility] PREP Evaluation  Patient Details  Name: Amanda Henry MRN: [id number] Date of Birth: [birth year] Age: 57 y.o. PCP: [name], FNP  Vitals:   06/10/22 1830  BP: 132/88  Pulse: 73  SpO2: 95%  Weight: 201 lb 3.2 oz (91.3 kg)     [facility] Eval - 06/19/22 1600       [facility] "PREP" Location   [facility] "PREP" Location [facility]      Referral    Referring Provider [place]    Program Start Date --   End date 06/10/22     Measurement   Waist Circumference 40 inches    Hip Circumference 43 inches    Body fat 39 percent      Information for Trainer   Goals reset      Mobility and Daily Activities   I find it easy to walk up or down two or more flights of stairs. 2    I have no trouble taking out the trash. 4    I do housework such as vacuuming and dusting on my own without difficulty. 4    I can easily lift a gallon of milk (8lbs). 4    I can easily walk a mile. 3    I have no trouble reaching into high cupboards or reaching down to pick up something from the floor. 3    I do not have trouble doing out-door work such as Armed forces logistics/support/administrative officer, raking leaves, or gardening. 4      Mobility and Daily Activities   I feel younger than my age. 2    I feel independent. 4    I feel energetic. 2    I live an active life.  3    I feel strong. 3    I feel healthy. 2    I feel active as other people my age. 3      How fit and strong are you.   Fit and Strong Total Score 43            Past Medical History:  Diagnosis Date   Anxiety    Blood transfusion without reported diagnosis    Depression    DM2 (diabetes mellitus, type 2) ([place])    Hypertension    no medications at this time   Hypothyroidism    Iron deficiency anemia    Obesity    Palpitations    Subarachnoid hemorrhage ([place])    Thyroid disease    Vitamin D deficiency    Past Surgical History:  Procedure Laterality Date   IR ANGIO INTRA EXTRACRAN SEL COM CAROTID INNOMINATE UNI L MOD SED  03/16/2021   IR ANGIO  INTRA EXTRACRAN SEL COM CAROTID INNOMINATE UNI L MOD SED  03/26/2021   IR ANGIO INTRA EXTRACRAN SEL INTERNAL CAROTID UNI R MOD SED  03/15/2021   IR ANGIO INTRA EXTRACRAN SEL INTERNAL CAROTID UNI R MOD SED  03/26/2021   IR ANGIO INTRA EXTRACRAN SEL INTERNAL CAROTID UNI R MOD SED  03/25/2021   IR ANGIO INTRA EXTRACRAN SEL INTERNAL CAROTID UNI R MOD SED  10/29/2021   IR ANGIO VERTEBRAL SEL VERTEBRAL UNI L MOD SED  03/15/2021   IR ANGIO VERTEBRAL SEL VERTEBRAL UNI L MOD SED  03/26/2021   IR ANGIOGRAM FOLLOW UP STUDY  03/26/2021   IR ANGIOGRAM FOLLOW UP STUDY  03/26/2021   IR TRANSCATH/EMBOLIZ  03/26/2021   IR US GUIDE VASC ACCESS RIGHT  03/26/2021   IR US GUIDE VASC ACCESS RIGHT  10/29/2021   LAPAROSCOPIC VAGINAL HYSTERECTOMY WITH SALPINGECTOMY Bilateral 10/01/2016   Procedure: LAPAROSCOPIC ASSISTED VAGINAL HYSTERECTOMY WITH SALPINGECTOMY;  Surgeon: Paula Compton, MD;  Location: [facility];  Service: Gynecology;  Laterality: Bilateral;   PARATHYROIDECTOMY Right 04/05/2021   Procedure: right inferioir PARATHYROIDECTOMY;  Surgeon: Armandina Gemma, MD;  Location: [facility];  Service: General;  Laterality: Right;   RADIOLOGY WITH ANESTHESIA N/A 03/15/2021   Procedure: IR WITH ANESTHESIA;  Surgeon: Consuella Lose, MD;  Location: [facility];  Service: Radiology;  Laterality: N/A;   RADIOLOGY WITH ANESTHESIA N/A 03/26/2021   Procedure: IR WITH ANESTHESIA (Coiling and Embolization);  Surgeon: Consuella Lose, MD;  Location: [facility];  Service: Radiology;  Laterality: N/A;   WISDOM TOOTH EXTRACTION     Social History   Tobacco Use  Smoking Status Never  Smokeless Tobacco Never  Attended 12 classes and 6 of 12 educational sessions.  Fit testing: Cardio March test: 382 to 297 Sit to stand: 12 to 12 Bicep curl: 17 to 27  Balance improved. Encouraged to continue to exercise  Barnett Hatter 06/19/2022, 4:38 PM

## 2022-06-26 ENCOUNTER — Ambulatory Visit (HOSPITAL_COMMUNITY)
Admission: RE | Admit: 2022-06-26 | Discharge: 2022-06-26 | Disposition: A | Discharge status: Home / Self Care | Payer: BC Managed Care – PPO | Source: Ambulatory Visit | Attending: Cardiology | Admitting: Cardiology

## 2022-06-26 DIAGNOSIS — E782 Mixed hyperlipidemia: Secondary | ICD-10-CM | POA: Insufficient documentation

## 2022-06-26 DIAGNOSIS — I10 Essential (primary) hypertension: Secondary | ICD-10-CM | POA: Diagnosis present

## 2022-06-30 ENCOUNTER — Encounter (HOSPITAL_BASED_OUTPATIENT_CLINIC_OR_DEPARTMENT_OTHER): Payer: Self-pay | Admitting: Cardiovascular Disease

## 2022-07-02 ENCOUNTER — Ambulatory Visit: Payer: BC Managed Care – PPO | Admitting: Nurse Practitioner

## 2022-07-04 ENCOUNTER — Other Ambulatory Visit: Payer: Self-pay | Admitting: Internal Medicine

## 2022-07-04 LAB — BASIC METABOLIC PANEL
BUN/Creatinine Ratio: 23 (ref 9–23)
BUN: 22 mg/dL (ref 6–24)
CO2: 25 mmol/L (ref 20–29)
Calcium: 9.4 mg/dL (ref 8.7–10.2)
Chloride: 99 mmol/L (ref 96–106)
Creatinine, Ser: 0.95 mg/dL (ref 0.57–1.00)
Glucose: 88 mg/dL (ref 70–99)
Potassium: 3.9 mmol/L (ref 3.5–5.2)
Sodium: 141 mmol/L (ref 134–144)
eGFR: 71 mL/min/{1.73_m2} (ref >59)

## 2022-07-04 LAB — MAGNESIUM: Magnesium: 2.4 mg/dL — ABNORMAL HIGH (ref 1.6–2.3)

## 2022-07-07 ENCOUNTER — Encounter: Payer: Self-pay | Admitting: Nurse Practitioner

## 2022-07-07 ENCOUNTER — Ambulatory Visit: Payer: BC Managed Care – PPO | Admitting: Nurse Practitioner

## 2022-07-07 VITALS — BP 118/74 | HR 75 | Temp 97.6°F | Ht 67.0 in | Wt 197.0 lb

## 2022-07-07 DIAGNOSIS — Z23 Encounter for immunization: Secondary | ICD-10-CM | POA: Diagnosis not present

## 2022-07-07 DIAGNOSIS — E669 Obesity, unspecified: Secondary | ICD-10-CM | POA: Insufficient documentation

## 2022-07-07 DIAGNOSIS — E559 Vitamin D deficiency, unspecified: Secondary | ICD-10-CM

## 2022-07-07 DIAGNOSIS — E782 Mixed hyperlipidemia: Secondary | ICD-10-CM | POA: Diagnosis not present

## 2022-07-07 DIAGNOSIS — K5904 Chronic idiopathic constipation: Secondary | ICD-10-CM

## 2022-07-07 DIAGNOSIS — E1169 Type 2 diabetes mellitus with other specified complication: Secondary | ICD-10-CM

## 2022-07-07 DIAGNOSIS — E213 Hyperparathyroidism, unspecified: Secondary | ICD-10-CM

## 2022-07-07 DIAGNOSIS — E039 Hypothyroidism, unspecified: Secondary | ICD-10-CM

## 2022-07-07 MED ORDER — FEXOFENADINE HCL 180 MG PO TABS: 180.0000 mg | ORAL_TABLET | Freq: Every day | ORAL | 1 refills | Status: AC

## 2022-07-07 NOTE — Patient Instructions (Signed)
Diabetes Mellitus and Exercise Regular exercise is important for your health, especially if you have diabetes mellitus. Exercise is not just about losing weight. It can also help you increase muscle strength and bone density and reduce body fat and stress. This can help your level of endurance and make you more fit and flexible. Why should I exercise if I have diabetes? Exercise has many benefits for people with diabetes. It can: Help lower and control your blood sugar (glucose). Help your body respond better and become more sensitive to the hormone insulin. Reduce how much insulin your body needs. Lower your risk for heart disease by: Lowering how much "bad" cholesterol and triglycerides you have in your body. Increasing how much "good" cholesterol you have in your body. Lowering your blood pressure. Lowering your blood glucose levels. What is my activity plan? Your health care provider or an expert trained in diabetes care (certified diabetes educator) can help you make an activity plan. This plan can help you find the type of exercise that works for you. It may also tell you how often to exercise and for how long. Be sure to: Get at least 150 minutes of medium-intensity or high-intensity exercise each week. This may involve brisk walking, biking, or water aerobics. Do stretching and strengthening exercises at least 2 times a week. This may involve yoga or weight lifting. Spread out your activity over at least 3 days of the week. Get some form of physical activity each day. Do not go more than 2 days in a row without some kind of activity. Avoid being inactive for more than 30 minutes at a time. Take frequent breaks to walk or stretch. Choose activities that you enjoy. Set goals that you know you can accomplish. Start slowly and increase the intensity of your exercise over time. How do I manage my diabetes during exercise?  Monitor your blood glucose Check your blood glucose before and  after you exercise. If your blood glucose is 240 mg/dL (13.3 mmol/L) or higher before you exercise, check your urine for ketones. These are chemicals created by the liver. If you have ketones in your urine, do not exercise until your blood glucose returns to normal. If your blood glucose is 100 mg/dL (5.6 mmol/L) or lower, eat a snack that has 15-20 grams of carbohydrate in it. Check your blood glucose 15 minutes after the snack to make sure that your level is above 100 mg/dL (5.6 mmol/L) before you start to exercise. Your risk for low blood glucose (hypoglycemia) goes up during and after exercise. Know the symptoms of this condition and how to treat it. Follow these instructions at home: Keep a carbohydrate snack on hand for use before, during, and after exercise. This can help prevent or treat hypoglycemia. Avoid injecting insulin into parts of your body that are going to be used during exercise. This may include: Your arms, when you are going to play tennis. Your legs, when you are about to go jogging. Keep track of your exercise habits. This can help you and your health care provider watch and adjust your activity plan. Write down: What you eat before and after you exercise. Blood glucose levels before and after you exercise. The type and amount of exercise you do. Talk to your health care provider before you start a new activity. They may need to: Make sure that the activity is safe for you. Adjust your insulin, other medicines, and food that you eat. Drink water while you exercise. This can   stop you from losing too much water (dehydration). It can also prevent problems caused by having a lot of heat in your body (heat stroke). Where to find more information American Diabetes Association: diabetes.org Association of Diabetes Care & Education Specialists: diabeteseducator.org This information is not intended to replace advice given to you by your health care provider. Make sure you discuss  any questions you have with your health care provider. Document Revised: 10/23/2021 Document Reviewed: 10/23/2021 Elsevier Patient Education  [place]. Recombinant Zoster (Shingles) Vaccine: What You Need to Know 1. Why get vaccinated? Recombinant zoster (shingles) vaccine can prevent shingles. Shingles (also called herpes zoster, or just zoster) is a painful skin rash, usually with blisters. In addition to the rash, shingles can cause fever, headache, chills, or upset stomach. Rarely, shingles can lead to complications such as pneumonia, hearing problems, blindness, brain inflammation (encephalitis), or death. The risk of shingles increases with age. The most common complication of shingles is long-term nerve pain called postherpetic neuralgia (PHN). PHN occurs in the areas where the shingles rash was and can last for months or years after the rash goes away. The pain from PHN can be severe and debilitating. The risk of PHN increases with age. An older adult with shingles is more likely to develop PHN and have longer lasting and more severe pain than a younger person. People with weakened immune systems also have a higher risk of getting shingles and complications from the disease. Shingles is caused by varicella-zoster virus, the same virus that causes chickenpox. After you have chickenpox, the virus stays in your body and can cause shingles later in life. Shingles cannot be passed from one person to another, but the virus that causes shingles can spread and cause chickenpox in someone who has never had chickenpox or has never received chickenpox vaccine. 2. Recombinant shingles vaccine Recombinant shingles vaccine provides strong protection against shingles. By preventing shingles, recombinant shingles vaccine also protects against PHN and other complications. Recombinant shingles vaccine is recommended for: Adults 13 years and older Adults 19 years and older who have a weakened immune  system because of disease or treatments Shingles vaccine is given as a two-dose series. For most people, the second dose should be given 2 to 6 months after the first dose. Some people who have or will have a weakened immune system can get the second dose 1 to 2 months after the first dose. Ask your health care provider for guidance. People who have had shingles in the past and people who have received varicella (chickenpox) vaccine are recommended to get recombinant shingles vaccine. The vaccine is also recommended for people who have already gotten another type of shingles vaccine, the live shingles vaccine. There is no live virus in recombinant shingles vaccine. Shingles vaccine may be given at the same time as other vaccines. 3. Talk with your health care provider Tell your vaccination provider if the person getting the vaccine: Has had an allergic reaction after a previous dose of recombinant shingles vaccine, or has any severe, life-threatening allergies Is currently experiencing an episode of shingles Is pregnant In some cases, your health care provider may decide to postpone shingles vaccination until a future visit. People with minor illnesses, such as a cold, may be vaccinated. People who are moderately or severely ill should usually wait until they recover before getting recombinant shingles vaccine. Your health care provider can give you more information. 4. Risks of a vaccine reaction A sore arm with mild or moderate  pain is very common after recombinant shingles vaccine. Redness and swelling can also happen at the site of the injection. Tiredness, muscle pain, headache, shivering, fever, stomach pain, and nausea are common after recombinant shingles vaccine. These side effects may temporarily prevent a vaccinated person from doing regular activities. Symptoms usually go away on their own in 2 to 3 days. You should still get the second dose of recombinant shingles vaccine even if you had  one of these reactions after the first dose. Guillain-Barr syndrome (GBS), a serious nervous system disorder, has been reported very rarely after recombinant zoster vaccine. People sometimes faint after medical procedures, including vaccination. Tell your provider if you feel dizzy or have vision changes or ringing in the ears. As with any medicine, there is a very remote chance of a vaccine causing a severe allergic reaction, other serious injury, or death. 5. What if there is a serious problem? An allergic reaction could occur after the vaccinated person leaves the clinic. If you see signs of a severe allergic reaction (hives, swelling of the face and throat, difficulty breathing, a fast heartbeat, dizziness, or weakness), call 9-1-1 and get the person to the nearest hospital. For other signs that concern you, call your health care provider. Adverse reactions should be reported to the Vaccine Adverse Event Reporting System (VAERS). Your health care provider will usually file this report, or you can do it yourself. Visit the VAERS website at www.vaers.SamedayNews.es or call 6392835303. VAERS is only for reporting reactions, and VAERS staff members do not give medical advice. 6. How can I learn more? Ask your health care provider. Call your local or state health department. Visit the website of the Food and Drug Administration (FDA) for vaccine package inserts and additional information at http://lopez-wang.org/. Contact the Centers for Disease Control and Prevention (CDC): Call (848)156-1836 (1-800-CDC-INFO) or Visit CDC's website at http://hunter.com/. Source: CDC Vaccine Information Statement Recombinant Zoster Vaccine (06/22/2020) This same material is available at http://www.wolf.info/ for no charge. This information is not intended to replace advice given to you by your health care provider. Make sure you discuss any questions you have with your health care provider. Document  Revised: 04/02/2021 Document Reviewed: 07/06/2020 Elsevier Patient Education  [place].

## 2022-07-07 NOTE — Progress Notes (Signed)
I,Sheena H Holbrook,acting as a Education administrator for [name], FNP.,have documented all relevant documentation on the behalf of [name], FNP,as directed by  [name], FNP while in the presence of [name], [place].    Subjective:     Patient ID: Amanda Henry , female    DOB: 1965-09-20 , 57 y.o.   MRN: [id number]   Chief Complaint  Patient presents with   Diabetes   Hypertension    HPI  Patient presents today for htn and DM follow up.  She has questions about Ozempic and her dose. Patient also reports muscle cramps to her BLE and feet, mostly at night. Patient states she is well hydrated. Patient also has allergy symptoms from her work environment, she does take daily allergy nasal spray but no other allergy meds. The building she is in face feels "heavy"  Wt Readings from Last 3 Encounters: 07/07/22 : 197 lb (89.4 kg) 06/16/22 : 194 lb (88 kg) 06/12/22 : 205 lb 0.4 oz (93 kg)  She has been on the low dose of Ozempic due to thinking her symptoms in the summer were related to [place] but her thyroid was off. She is only taking 0.25 mg Ozempic weekly. She is walking but not exercising as much. She does have intermittent constipation.      Past Medical History:  Diagnosis Date   Anxiety    Blood transfusion without reported diagnosis    Depression    DM2 (diabetes mellitus, type 2) ([place])    Hypertension    no medications at this time   Hypothyroidism    Iron deficiency anemia    Obesity    Palpitations    Subarachnoid hemorrhage (HCC)    Thyroid disease    Vitamin D deficiency      Family History  Problem Relation Age of Onset   Thyroid disease Mother    Hypertension Mother    Diabetes Father    Hypertension Other    Diabetes Other    Thyroid disease Other    Heart disease Other      Current Outpatient Medications:    ALPRAZolam (XANAX XR) 1 MG 24 hr tablet, Take 0.5 mg by mouth daily as needed for sleep., Disp: , Rfl:    amLODipine-olmesartan (AZOR)  10-40 MG tablet, Take 1 tablet by mouth daily., Disp: 30 tablet, Rfl: 5   atorvastatin (LIPITOR) 20 MG tablet, Take 1 tablet (20 mg total) by mouth daily., Disp: 90 tablet, Rfl: 3   azelastine (ASTELIN) 0.1 % nasal spray, Place 2 sprays into both nostrils 2 (two) times daily. Use in each nostril as directed, Disp: 30 mL, Rfl: 2   blood glucose meter kit and supplies KIT, Dispense based on patient and insurance preference. Use up to four times daily as directed., Disp: 1 each, Rfl: 0   escitalopram (LEXAPRO) 10 MG tablet, Take 1 tablet (10 mg total) by mouth daily., Disp: 30 tablet, Rfl: 1   fexofenadine (ALLEGRA ALLERGY) 180 MG tablet, Take 1 tablet (180 mg total) by mouth daily., Disp: 90 tablet, Rfl: 1   Glucagon (GVOKE HYPOPEN 2-PACK) 0.5 MG/0.1ML SOAJ, Inject 0.1 mg into the skin as needed., Disp: 0.1 mL, Rfl: 6   glucose blood test strip, Use as instructed, Disp: 100 each, Rfl: 12   hydrochlorothiazide (HYDRODIURIL) 25 MG tablet, Take 1 tablet (25 mg total) by mouth daily., Disp: 90 tablet, Rfl: 3   ipratropium (ATROVENT) 0.06 % nasal spray, Place 2 sprays into the nose 3 (three) times  daily., Disp: 15 mL, Rfl: 2   levothyroxine (SYNTHROID) 125 MCG tablet, TAKE 1 TABLET(125 MCG) BY MOUTH DAILY, Disp: 45 tablet, Rfl: 0   MAGNESIUM GLYCINATE PO, Take 500 mg by mouth., Disp: , Rfl:    meclizine (ANTIVERT) 12.5 MG tablet, Take 1 tablet (12.5 mg total) by mouth 3 (three) times daily as needed for dizziness., Disp: 30 tablet, Rfl: 1   metoprolol succinate (TOPROL-XL) 25 MG 24 hr tablet, Take 25 mg by mouth as needed (FOR PALPITATIONS)., Disp: , Rfl:    Semaglutide,0.25 or 0.5MG/DOS, (OZEMPIC, 0.25 OR 0.5 MG/DOSE,) 2 MG/3ML SOPN, Inject 0.5 mg into the skin once a week., Disp: 9 mL, Rfl: 1   Vitamin D, Ergocalciferol, (DRISDOL) 1.25 MG (50000 UNIT) CAPS capsule, TAKE 1 CAPSULE BY MOUTH EVERY 7 DAYS, Disp: 12 capsule, Rfl: 1   Allergies  Allergen Reactions   Latex Rash   Penicillins Rash    Has  patient had a PCN reaction causing immediate rash, facial/tongue/throat swelling, SOB or lightheadedness with hypotension: Yes Has patient had a PCN reaction causing severe rash involving mucus membranes or skin necrosis: No Has patient had a PCN reaction that required hospitalization No Has patient had a PCN reaction occurring within the last 10 years: No If all of the above answers are "NO", then may proceed with Cephalosporin use.      Review of Systems  Constitutional: Negative.   Respiratory: Negative.    Cardiovascular: Negative.   Gastrointestinal: Negative.   Neurological: Negative.   Psychiatric/Behavioral: Negative.    All other systems reviewed and are negative.    Today's Vitals   07/07/22 1618  BP: 118/74  Pulse: 75  Temp: 97.6 F (36.4 C)  TempSrc: Oral  SpO2: 95%  Weight: 197 lb (89.4 kg)  Height: 5' 7"$  (1.702 m)   Body mass index is 30.85 kg/m.   Objective:  Physical Exam Vitals reviewed.  Constitutional:      General: She is not in acute distress.    Appearance: Normal appearance. She is obese.  Cardiovascular:     Rate and Rhythm: Normal rate and regular rhythm.     Pulses: Normal pulses.     Heart sounds: Normal heart sounds. No murmur heard. Pulmonary:     Effort: Pulmonary effort is normal. No respiratory distress.     Breath sounds: Normal breath sounds. No wheezing.  Skin:    General: Skin is warm and dry.     Capillary Refill: Capillary refill takes less than 2 seconds.  Neurological:     General: No focal deficit present.     Mental Status: She is alert and oriented to person, place, and time.     Cranial Nerves: No cranial nerve deficit.     Motor: No weakness.  Psychiatric:        Mood and Affect: Mood normal.        Behavior: Behavior normal.        Thought Content: Thought content normal.        Judgment: Judgment normal.         Assessment And Plan:     1. Diabetes mellitus type 2 in obese [facility]) Comments: HgbA1c was better  last visit, will increase to 0.5 mg weekly she has been taking 0.25 mg. - Hemoglobin A1c  2. Hyperparathyroidism ([place]) Comments: Continue f/u with Endocrinology.  3. Mixed hyperlipidemia Comments: Cholesterol levels are slightly elevated - Lipid panel  4. Acquired hypothyroidism Comments: She had some changes to her levothyroxine  by Endocrinology at her last visit, will recheck levels today. - TSH + free T4  5. Chronic idiopathic constipation Comments: Encouraged to take a stool softner every other day this may also be related to her Ozempic. Encouraged to increase physical activity  6. Vitamin D deficiency Will check vitamin D level and supplement as needed.    Also encouraged to spend 15 minutes in the sun daily.  - VITAMIN D 25 Hydroxy (Vit-D Deficiency, Fractures)  7. Need for shingles vaccine Shingrix #2 given in office. - Zoster Recombinant (Shingrix )     Patient was given opportunity to ask questions. Patient verbalized understanding of the plan and was able to repeat key elements of the plan. All questions were answered to their satisfaction.  [name], FNP   I, [name], FNP, have reviewed all documentation for this visit. The documentation on 07/07/22 for the exam, diagnosis, procedures, and orders are all accurate and complete.   IF YOU HAVE BEEN REFERRED TO A SPECIALIST, IT MAY TAKE 1-2 WEEKS TO SCHEDULE/PROCESS THE REFERRAL. IF YOU HAVE NOT HEARD FROM US/SPECIALIST IN TWO WEEKS, PLEASE GIVE [place] A CALL AT [phone] X [phone].   THE PATIENT IS ENCOURAGED TO PRACTICE SOCIAL DISTANCING DUE TO THE COVID-19 PANDEMIC.

## 2022-07-08 LAB — LIPID PANEL
Chol/HDL Ratio: 4.1 ratio (ref 0.0–4.4)
Cholesterol, Total: 218 mg/dL — ABNORMAL HIGH (ref 100–199)
HDL: 53 mg/dL (ref >39)
LDL Chol Calc (NIH): 135 mg/dL — ABNORMAL HIGH (ref 0–99)
Triglycerides: 171 mg/dL — ABNORMAL HIGH (ref 0–149)
VLDL Cholesterol Cal: 30 mg/dL (ref 5–40)

## 2022-07-08 LAB — HEMOGLOBIN A1C
Est. average glucose Bld gHb Est-mCnc: 143 mg/dL
Hgb A1c MFr Bld: 6.6 % — ABNORMAL HIGH (ref 4.8–5.6)

## 2022-07-08 LAB — VITAMIN D 25 HYDROXY (VIT D DEFICIENCY, FRACTURES): Vit D, 25-Hydroxy: 44.8 ng/mL (ref 30.0–100.0)

## 2022-07-08 LAB — TSH+FREE T4
Free T4: 1.39 ng/dL (ref 0.82–1.77)
TSH: 1.21 u[IU]/mL (ref 0.450–4.500)

## 2022-07-10 ENCOUNTER — Ambulatory Visit (HOSPITAL_BASED_OUTPATIENT_CLINIC_OR_DEPARTMENT_OTHER): Payer: BC Managed Care – PPO

## 2022-07-10 ENCOUNTER — Encounter (HOSPITAL_BASED_OUTPATIENT_CLINIC_OR_DEPARTMENT_OTHER): Payer: Self-pay

## 2022-07-10 NOTE — Progress Notes (Deleted)
Office Visit    Patient Name: Amanda Henry Date of Encounter: 07/10/2022  Primary Care Provider:  [name], [place] Primary Cardiologist:  None  Chief Complaint    Hypertension - Advanced hypertension clinic  Past Medical History   HLD Held atorvastatin 2/2/ leg cramping  hypothyroid TSH, T4 WNL on levothyroxine 0.125 mg  OSA Started CPAP recently          Allergies  Allergen Reactions   Latex Rash   Penicillins Rash    Has patient had a PCN reaction causing immediate rash, facial/tongue/throat swelling, SOB or lightheadedness with hypotension: Yes Has patient had a PCN reaction causing severe rash involving mucus membranes or skin necrosis: No Has patient had a PCN reaction that required hospitalization No Has patient had a PCN reaction occurring within the last 10 years: No If all of the above answers are "NO", then may proceed with Cephalosporin use.     History of Present Illness    Amanda Henry is a 57 y.o. female patient who was referred to the Advanced [facility]pert[facility] Clinic by Darleen Crocker FNP  Blood Pressure Goal:  130/80  Current Medications:   Adherence Assessment  Do you ever forget to take your medication? []$ Yes []$ No  Do you ever skip doses due to side effects? []$ Yes []$ No  Do you have trouble affording your medicines? []$ Yes []$ No  Are you ever unable to pick up your medication due to transportation difficulties? []$ Yes []$ No  Do you ever stop taking your medications because you don't believe they are helping? []$ Yes []$ No  Do you check your weight daily? []$ Yes []$ No   Adherence strategy: ***  Barriers to obtaining medications: ***  Previously tried:     Family Hx:     Social Hx:      Tobacco:  Alcohol:  Caffeine:    Diet:      Exercise:   Home BP readings:       Accessory Clinical Findings    Lab Results  Component Value Date   CREATININE 0.95 07/03/2022   BUN 22 07/03/2022   NA 141 07/03/2022   K 3.9 07/03/2022   CL  99 07/03/2022   CO2 25 07/03/2022   Lab Results  Component Value Date   ALT 19 02/19/2022   AST 20 02/19/2022   ALKPHOS 107 02/19/2022   BILITOT 0.3 02/19/2022   Lab Results  Component Value Date   HGBA1C 6.6 (H) 07/07/2022    Screening for Secondary Hypertension: { Click here to document screening for secondary causes of HTN  :1}     03/20/2022    9:31 AM 05/01/2022   11:29 AM  Causes  Drugs/Herbals Screened   Renovascular HTN Not Screened Screened     - Comments Consider in future if BP difficult to control renal duplex ordered  Sleep Apnea Screened Screened     - Comments  completed sleep study  Thyroid Disease Screened   Hyperaldosteronism Not Screened      - Comments Consider in future if BP remains difficult to control, will need to hold Olmesartan prior   Pheochromocytoma Screened   Cushing's Syndrome Screened   Hyperparathyroidism Screened   Coarctation of the Aorta Screened      - Comments BP symmetrical   Compliance Screened     Relevant Labs/Studies:    Latest Ref Rng & Units 07/03/2022    3:16 PM 06/12/2022    2:37 PM 02/19/2022    5:32 PM  Basic Labs  Sodium 134 -  144 mmol/L 141  138  139   Potassium 3.5 - 5.2 mmol/L 3.9  3.5  4.3   Creatinine 0.57 - 1.00 mg/dL 0.95  1.01  0.90        Latest Ref Rng & Units 07/07/2022    4:54 PM 02/19/2022    5:32 PM  Thyroid   TSH 0.450 - 4.500 uIU/mL 1.210  0.842           Latest Ref Rng & Units 03/20/2022    9:31 AM  Metanephrines/Catecholamines   Epinephrine pg/mL CANCELED   Norepinephrine pg/mL CANCELED   Dopamine pg/mL CANCELED   Metanephrines 0.0 - 88.0 pg/mL 50.6   Normetanephrines  0.0 - 244.0 pg/mL 87.2           06/26/2022    8:45 AM  Renovascular   Renal Artery [place] Completed Yes      Home Medications    Current Outpatient Medications  Medication Sig Dispense Refill   ALPRAZolam (XANAX XR) 1 MG 24 hr tablet Take 0.5 mg by mouth daily as needed for sleep.     amLODipine-olmesartan (AZOR)  10-40 MG tablet Take 1 tablet by mouth daily. 30 tablet 5   atorvastatin (LIPITOR) 20 MG tablet Take 1 tablet (20 mg total) by mouth daily. 90 tablet 3   azelastine (ASTELIN) 0.1 % nasal spray Place 2 sprays into both nostrils 2 (two) times daily. Use in each nostril as directed 30 mL 2   blood glucose meter kit and supplies KIT Dispense based on patient and insurance preference. Use up to four times daily as directed. 1 each 0   escitalopram (LEXAPRO) 10 MG tablet Take 1 tablet (10 mg total) by mouth daily. 30 tablet 1   fexofenadine (ALLEGRA ALLERGY) 180 MG tablet Take 1 tablet (180 mg total) by mouth daily. 90 tablet 1   Glucagon (GVOKE HYPOPEN 2-PACK) 0.5 MG/0.1ML SOAJ Inject 0.1 mg into the skin as needed. 0.1 mL 6   glucose blood test strip Use as instructed 100 each 12   hydrochlorothiazide (HYDRODIURIL) 25 MG tablet Take 1 tablet (25 mg total) by mouth daily. 90 tablet 3   ipratropium (ATROVENT) 0.06 % nasal spray Place 2 sprays into the nose 3 (three) times daily. 15 mL 2   levothyroxine (SYNTHROID) 125 MCG tablet TAKE 1 TABLET(125 MCG) BY MOUTH DAILY 45 tablet 0   MAGNESIUM GLYCINATE PO Take 500 mg by mouth.     meclizine (ANTIVERT) 12.5 MG tablet Take 1 tablet (12.5 mg total) by mouth 3 (three) times daily as needed for dizziness. 30 tablet 1   metoprolol succinate (TOPROL-XL) 25 MG 24 hr tablet Take 25 mg by mouth as needed (FOR PALPITATIONS).     Semaglutide,0.25 or 0.5MG/DOS, (OZEMPIC, 0.25 OR 0.5 MG/DOSE,) 2 MG/3ML SOPN Inject 0.5 mg into the skin once a week. 9 mL 1   Vitamin D, Ergocalciferol, (DRISDOL) 1.25 MG (50000 UNIT) CAPS capsule TAKE 1 CAPSULE BY MOUTH EVERY 7 DAYS 12 capsule 1   No current facility-administered medications for this visit.     Assessment & Plan   No BP recorded.  {Refresh Note OR Click here to enter BP  :1}***   No problem-specific Assessment & Plan notes found for this encounter.   Tommy Medal PharmD [facility]PP [place]  [address] [place], [place] [address] [phone]

## 2022-07-15 ENCOUNTER — Encounter: Payer: Self-pay | Admitting: Nurse Practitioner

## 2022-07-16 ENCOUNTER — Other Ambulatory Visit: Payer: Self-pay | Admitting: Nurse Practitioner

## 2022-07-16 DIAGNOSIS — R051 Acute cough: Secondary | ICD-10-CM

## 2022-07-16 DIAGNOSIS — J302 Other seasonal allergic rhinitis: Secondary | ICD-10-CM

## 2022-07-16 MED ORDER — AZELASTINE HCL 0.1 % NA SOLN: 2.0000 | Freq: Two times a day (BID) | NASAL | 2 refills | Status: DC

## 2022-07-17 ENCOUNTER — Encounter: Payer: Self-pay | Admitting: Internal Medicine

## 2022-07-17 ENCOUNTER — Ambulatory Visit: Payer: BC Managed Care – PPO | Admitting: Internal Medicine

## 2022-07-17 VITALS — BP 128/80 | HR 64 | Ht 67.0 in | Wt 198.0 lb

## 2022-07-17 DIAGNOSIS — E559 Vitamin D deficiency, unspecified: Secondary | ICD-10-CM

## 2022-07-17 DIAGNOSIS — E213 Hyperparathyroidism, unspecified: Secondary | ICD-10-CM

## 2022-07-17 DIAGNOSIS — E039 Hypothyroidism, unspecified: Secondary | ICD-10-CM | POA: Diagnosis not present

## 2022-07-17 MED ORDER — LEVOTHYROXINE SODIUM 125 MCG PO TABS: ORAL_TABLET | ORAL | 3 refills | Status: DC

## 2022-07-17 NOTE — Progress Notes (Signed)
Patient ID: Amanda Henry, female   DOB: 12-07-1965, 57 y.o.   MRN: [id number]   HPI  Amanda Henry is a 57 y.o.-year-old female, initially referred referred by her PCP, [name], FNP, returning for f/u for hypercalcemia/hyperparathyroidism and also hypothyroidism. Last OV 1 year ago.  Interim history: At today's visit, she feels well, without complaints.  Pt was dx with hypercalcemia in 2018 and hyperparathyroidism in 05/2019.   She described being very tired, having a whooshing sound in her head, tremor, fell out of bed  - lost consciousness, had urine loss and headache  - taken to the hospital >> found to have high blood pressure and a subarachnoid hemorrhage - this was coiled. While in the hospital, calcium was undetectably high (>15).    A parathyroid adenoma was resected while in-house in 03/2021.  Surprisingly, final pathology was benign.  After surgery, she had persistent tingling throughout her body, but this resolved.  I reviewed pt's pertinent labs: Component     Latest Ref Rng & Units 04/10/2021  Calcium Ionized     4.8 - 5.6 mg/dL 5.40   Lab Results  Component Value Date   PTH 26 07/12/2021   PTH Comment 07/12/2021   PTH 311 (H) 04/03/2021   PTH Comment 04/03/2021   PTH 163 (H) 11/28/2020   PTH 163 (H) 06/14/2019   CALCIUM 9.4 07/03/2022   CALCIUM 9.6 06/12/2022   CALCIUM 9.4 02/19/2022   CALCIUM 9.4 12/04/2021   CALCIUM 8.7 (L) 10/29/2021   CALCIUM 9.8 10/12/2021   CALCIUM 9.9 08/26/2021   CALCIUM 9.2 07/12/2021   CALCIUM 9.1 04/15/2021   CALCIUM 9.7 04/08/2021   Lab Results  Component Value Date   PHOS 2.7 04/10/2021   PHOS 1.8 (L) 04/07/2021   PHOS <1.0 (LL) 04/05/2021   PHOS 1.7 (L) 04/03/2021   PHOS 3.0 04/01/2021   PHOS 1.6 (L) 11/28/2020   Component     Latest Ref Rng & Units 11/28/2020  Calcium     8.6 - 10.4 mg/dL 14.4 (HH)  Vitamin D 1, 25 (OH) Total     18 - 72 pg/mL 127 (H)  Vitamin D3 1, 25 (OH)     pg/mL 19  Vitamin D2 1, 25  (OH)     pg/mL 108  Vitamin D, 25-Hydroxy     30.0 - 100.0 ng/mL 32.6  PTH, Intact     15 - 65 pg/mL 163 (H)  Phosphorus     2.3 - 4.6 mg/dL 1.6 (L)  Magnesium     1.5 - 2.5 mg/dL 2.0   Component     Latest Ref Rng & Units 12/03/2020  Creatinine, 24H Ur     0.50 - 2.15 g/24 h 1.43  Calcium, 24H Urine     mg/24 h 390 (H)  FECa 0.216%  04/04/2021: Technetium sestamibi scan: There is a focus of persistent/delayed uptake in the lower right thyroid lobe consistent with a parathyroid adenoma. This appears to correlate with the CT and ultrasound findings of a 2 cm nodule in this area.  She had right inferior parathyroidectomy by Dr. Harlow Asa on 04/06/2021: A. PARATHYROID, RIGHT INFERIOR, ADENOMA, PARATHYROIDECTOMY:  - Parathyroid adenoma, 4.36 g.  See comment  - Scant thyroid tissue with lymphocytic thyroiditis - no evidence of  malignancy   The parathyroid adenoma shows a few thick fibrous bands which is one of  the features of an atypical adenoma but it does not show other atypical  features such as necrosis, nuclear atypia or increased  mitotic activity.  Dr. Saralyn Pilar reviewed the case and concurs with the diagnosis.  Clinical  correlation is suggested   No h/o osteoporosis.  No DXA scans available for review. No fractures or falls.  No h/o kidney stones.  No h/o CKD. Last BUN/Cr: Lab Results  Component Value Date   BUN 22 07/03/2022   BUN 24 (H) 06/12/2022   CREATININE 0.95 07/03/2022   CREATININE 1.01 (H) 06/12/2022   Of note, her alkaline phosphatase was elevated once, but at that time she also had a low vitamin D: Lab Results  Component Value Date   ALKPHOS 107 02/19/2022   ALKPHOS 75 12/04/2021   ALKPHOS 91 10/12/2021   ALKPHOS 108 04/08/2021   ALKPHOS 97 04/07/2021   ALKPHOS 109 04/06/2021   ALKPHOS 118 04/03/2021   ALKPHOS 186 (H) 05/31/2019   ALKPHOS 79 09/10/2016   ALKPHOS 66 08/28/2016   SPEP was normal: Component     Latest Ref Rng & Units 05/31/2019  06/14/2019  Total Protein     6.0 - 8.5 g/dL 7.3 7.3  Albumin ELP     2.9 - 4.4 g/dL 4.0 3.9  Alpha 1     0.0 - 0.4 g/dL 0.2 0.2  Alpha 2     0.4 - 1.0 g/dL 0.6 0.7  Beta     0.7 - 1.3 g/dL 1.0 1.1  Gamma Globulin     0.4 - 1.8 g/dL 1.5 1.5  M-SPIKE, %     Not Observed g/dL Not Observed Not Observed  Globulin, Total     2.2 - 3.9 g/dL 3.3 3.4  A/G Ratio     0.7 - 1.7 1.2 1.1   Pt is not on HCTZ.  Vitamin D deficiency:  Reviewed vit D levels: Lab Results  Component Value Date   VD25OH 44.8 07/07/2022   VD25OH 26.81 (L) 07/12/2021   VD25OH 113.24 (H) 04/03/2021   VD25OH 32.6 11/28/2020   VD25OH 24.6 (L) 10/19/2020   VD25OH 17.2 (L) 09/25/2020   VD25OH 28.3 (L) 02/14/2020   VD25OH 23.3 (L) 11/28/2019   VD25OH 12.4 (L) 05/31/2019   Pt is on ergocalciferol 50,000 units once a week, then stopped in 03/2021 - restarted right before last visit.  We continued it.  Pt does not have a FH of hypercalcemia, pituitary tumors, thyroid cancer, or osteoporosis.   Hypothyroidism:  - s/p RAI tx for Graves 1991  She continues on levothyroxine 125 mcg daily (decreased from 137 mcg daily in 12/2021): - fasting - at least 30 min from b'fast - prev. Ca 200 mg 2x a day  >> Tums tablet in am >> off now (forgot) - no Fe - off MVI - no PPIs  - stopped Biotin  Reviewed her TFTs : Lab Results  Component Value Date   TSH 1.210 07/07/2022   TSH 0.842 02/19/2022   TSH 0.27 (L) 12/25/2021   TSH 0.350 12/04/2021   TSH 0.225 (L) 11/05/2021   TSH 0.86 07/12/2021   TSH 0.403 04/04/2021   TSH 2.090 10/19/2020   TSH 14.800 (H) 09/25/2020   TSH 0.33 (L) 02/14/2020   TSH 0.219 (L) 11/28/2019   TSH 0.622 05/31/2019   TSH 1.900 10/21/2018   TSH 6.510 (H) 09/07/2018   TSH 25.010 (H) 06/03/2018   Lab Results  Component Value Date   FREET4 1.39 07/07/2022   FREET4 1.29 02/19/2022   FREET4 1.57 12/25/2021   FREET4 0.98 07/12/2021   FREET4 1.38 10/19/2020   FREET4 1.12 02/14/2020  FREET4 1.17 09/07/2018   FREET4 0.41 (L) 06/03/2018   Lab Results  Component Value Date   T3FREE 3.1 11/05/2021   T3FREE 1.4 (L) 09/25/2020   T3FREE 2.7 11/28/2019   T3FREE 2.7 05/31/2019   T3FREE 2.6 10/21/2018   Pt denies: - feeling nodules in neck - hoarseness - dysphagia - choking  She has family history of thyroid disease in M and M uncles.  No Fh of thyroid cancer. + h/o radiation tx to head or neck - in 1991 (RAI for Graves).  No herbal supplements. No recent steroids use.   Lab Results  Component Value Date   HGBA1C 6.6 (H) 07/07/2022   HGBA1C 6.5 (H) 02/19/2022   HGBA1C 7.5 (H) 11/05/2021   HGBA1C 7.8 (H) 08/26/2021   HGBA1C 5.9 (H) 05/31/2019   HGBA1C 6.3 (H) 09/07/2018   HGBA1C 6.3 (H) 06/03/2018   ROS: + See HPI  I reviewed pt's medications, allergies, PMH, social hx, family hx, and changes were documented in the history of present illness. Otherwise, unchanged from my initial visit note.  Past Medical History:  Diagnosis Date   Anxiety    Blood transfusion without reported diagnosis    Depression    DM2 (diabetes mellitus, type 2) ([place])    Hypertension    no medications at this time   Hypothyroidism    Iron deficiency anemia    Obesity    Palpitations    Subarachnoid hemorrhage ([place])    Thyroid disease    Vitamin D deficiency    Past Surgical History:  Procedure Laterality Date   IR ANGIO INTRA EXTRACRAN SEL COM CAROTID INNOMINATE UNI L MOD SED  03/16/2021   IR ANGIO INTRA EXTRACRAN SEL COM CAROTID INNOMINATE UNI L MOD SED  03/26/2021   IR ANGIO INTRA EXTRACRAN SEL INTERNAL CAROTID UNI R MOD SED  03/15/2021   IR ANGIO INTRA EXTRACRAN SEL INTERNAL CAROTID UNI R MOD SED  03/26/2021   IR ANGIO INTRA EXTRACRAN SEL INTERNAL CAROTID UNI R MOD SED  03/25/2021   IR ANGIO INTRA EXTRACRAN SEL INTERNAL CAROTID UNI R MOD SED  10/29/2021   IR ANGIO VERTEBRAL SEL VERTEBRAL UNI L MOD SED  03/15/2021   IR ANGIO VERTEBRAL SEL VERTEBRAL UNI L MOD SED  03/26/2021    IR ANGIOGRAM FOLLOW UP STUDY  03/26/2021   IR ANGIOGRAM FOLLOW UP STUDY  03/26/2021   IR TRANSCATH/EMBOLIZ  03/26/2021   IR US GUIDE VASC ACCESS RIGHT  03/26/2021   IR US GUIDE VASC ACCESS RIGHT  10/29/2021   LAPAROSCOPIC VAGINAL HYSTERECTOMY WITH SALPINGECTOMY Bilateral 10/01/2016   Procedure: LAPAROSCOPIC ASSISTED VAGINAL HYSTERECTOMY WITH SALPINGECTOMY;  Surgeon: Paula Compton, MD;  Location: [facility];  Service: Gynecology;  Laterality: Bilateral;   PARATHYROIDECTOMY Right 04/05/2021   Procedure: right inferioir PARATHYROIDECTOMY;  Surgeon: Armandina Gemma, MD;  Location: [facility];  Service: General;  Laterality: Right;   RADIOLOGY WITH ANESTHESIA N/A 03/15/2021   Procedure: IR WITH ANESTHESIA;  Surgeon: Consuella Lose, MD;  Location: [facility];  Service: Radiology;  Laterality: N/A;   RADIOLOGY WITH ANESTHESIA N/A 03/26/2021   Procedure: IR WITH ANESTHESIA (Coiling and Embolization);  Surgeon: Consuella Lose, MD;  Location: [facility];  Service: Radiology;  Laterality: N/A;   WISDOM TOOTH EXTRACTION     Social History   Socioeconomic History   Marital status: Single    Spouse name: Not on file   Number of children: 1   Years of education: Not on file   Highest education level: Not on  file  Occupational History   Occupation: Financial planner  Tobacco Use   Smoking status: Never   Smokeless tobacco: Never  Substance and Sexual Activity   Alcohol use: No   Drug use: No   Sexual activity: Yes    Birth control/protection: Condom  Other Topics Concern   Not on file  Social History Narrative   Not on file   Social Determinants of Health   Financial Resource Strain: Not on file  Food Insecurity: Not on file  Transportation Needs: Not on file  Physical Activity: Not on file  Stress: Not on file  Social Connections: Not on file  Intimate Partner Violence: Not on file   Current Outpatient Medications on File Prior to Visit  Medication Sig Dispense Refill   ALPRAZolam (XANAX  XR) 1 MG 24 hr tablet Take 0.5 mg by mouth daily as needed for sleep.     amLODipine-olmesartan (AZOR) 10-40 MG tablet Take 1 tablet by mouth daily. 30 tablet 5   atorvastatin (LIPITOR) 20 MG tablet Take 1 tablet (20 mg total) by mouth daily. 90 tablet 3   azelastine (ASTELIN) 0.1 % nasal spray Place 2 sprays into both nostrils 2 (two) times daily. Use in each nostril as directed 30 mL 2   blood glucose meter kit and supplies KIT Dispense based on patient and insurance preference. Use up to four times daily as directed. 1 each 0   escitalopram (LEXAPRO) 10 MG tablet Take 1 tablet (10 mg total) by mouth daily. 30 tablet 1   fexofenadine (ALLEGRA ALLERGY) 180 MG tablet Take 1 tablet (180 mg total) by mouth daily. 90 tablet 1   Glucagon (GVOKE HYPOPEN 2-PACK) 0.5 MG/0.1ML SOAJ Inject 0.1 mg into the skin as needed. 0.1 mL 6   glucose blood test strip Use as instructed 100 each 12   hydrochlorothiazide (HYDRODIURIL) 25 MG tablet Take 1 tablet (25 mg total) by mouth daily. 90 tablet 3   ipratropium (ATROVENT) 0.06 % nasal spray Place 2 sprays into the nose 3 (three) times daily. 15 mL 2   levothyroxine (SYNTHROID) 125 MCG tablet TAKE 1 TABLET(125 MCG) BY MOUTH DAILY 45 tablet 0   MAGNESIUM GLYCINATE PO Take 500 mg by mouth.     meclizine (ANTIVERT) 12.5 MG tablet Take 1 tablet (12.5 mg total) by mouth 3 (three) times daily as needed for dizziness. 30 tablet 1   metoprolol succinate (TOPROL-XL) 25 MG 24 hr tablet Take 25 mg by mouth as needed (FOR PALPITATIONS).     Semaglutide,0.25 or 0.'5MG'$ /DOS, (OZEMPIC, 0.25 OR 0.5 MG/DOSE,) 2 MG/3ML SOPN Inject 0.5 mg into the skin once a week. 9 mL 1   Vitamin D, Ergocalciferol, (DRISDOL) 1.25 MG (50000 UNIT) CAPS capsule TAKE 1 CAPSULE BY MOUTH EVERY 7 DAYS 12 capsule 1   No current facility-administered medications on file prior to visit.   Allergies  Allergen Reactions   Latex Rash   Penicillins Rash    Has patient had a PCN reaction causing immediate rash,  facial/tongue/throat swelling, SOB or lightheadedness with hypotension: Yes Has patient had a PCN reaction causing severe rash involving mucus membranes or skin necrosis: No Has patient had a PCN reaction that required hospitalization No Has patient had a PCN reaction occurring within the last 10 years: No If all of the above answers are "NO", then may proceed with Cephalosporin use.    Family History  Problem Relation Age of Onset   Thyroid disease Mother    Hypertension Mother  Diabetes Father    Hypertension Other    Diabetes Other    Thyroid disease Other    Heart disease Other    PE: LMP 08/28/2016 (Exact Date) Comment: come back on 09/15/2016 Wt Readings from Last 3 Encounters:  07/07/22 197 lb (89.4 kg)  06/16/22 194 lb (88 kg)  06/12/22 205 lb 0.4 oz (93 kg)   Constitutional: overweight, in NAD. No kyphosis. Eyes: EOMI, + bilateral symmetric exophthalmos, no lid lag ENT: no thyromegaly, no cervical lymphadenopathy, + cervical scar healed with a little keloid on the left half of the scar Cardiovascular: RRR, No MRG Respiratory: CTA B Musculoskeletal: no deformities Skin:  no rashes Neurological: no tremor with outstretched hands  Assessment: 1. Hypercalcemia/hyperparathyroidism  2.  Acquired Hypothyroidism  3.  Vitamin D deficiency  Plan: Patient with a history of severe hypercalcemia and vitamin D deficiency, with previously undetectably high calcium (!) -She has a history of constipation, but no history of nephrolithiasis, osteoporosis, fractures, abdominal pain, bone pain. -We have tried to direct her to surgery and at last visit, she finally went and saw Dr. Harlow Asa recently -She recently was scheduled for presurgical evaluation before her parathyroid surgery, however, she was [facility] after having had what appears to be a seizure.  While in the hospital her calcium was undetectably high again.  She was hydrated, given bisphosphonates and she  had a technetium sestamibi scan on 04/04/2021 which showed a parathyroid adenoma of the right in the right inferior part of the thyroid.  She had parathyroidectomy by Dr. Harlow Asa on 04/06/2021.  Final pathology showed a parathyroid adenoma, not carcinoma. -After her right inferior parathyroidectomy, her calcium normalized.  Phosphorus was very low in the hospital but then normalized. -She was started on calcium after surgery, 200 mg twice a day.  She could not find this so I advised her to take 1 Tums tablet a day, with a meal.  She was on this, but not lately, as she forgot. -In the past, she had occasional tingling in her body and I advised her to take extra calcium in the situations.  No recent episodes. -At last visit, we checked her calcium and PTH levels and they were normal.  She had another calcium level checked 07/07/2022 and this was also normal, at 9.4.  Since she is asymptomatic and calcium was normal, I advised her to only use calcium (Tums) on an as-needed basis if she again develops tingling. -She has a little keloid on the left half of the scar-I recommended kelo-cote strips >> she did not try this, but still try Moderma.  I advised her that Levan Hurst will not help the keloid so she will try the kelo-cote strips - I will see the patient back in 1 year  2.  Acquired hypothyroidism - latest thyroid labs reviewed with pt. >> normal: Lab Results  Component Value Date   TSH 1.210 07/07/2022  -Since last visit, he gradually decrease the dose of LT4 from 150 to 125 mcg daily, last dose decrease 12/2021 - pt feels good on this dose. - we discussed about taking the thyroid hormone every day, with water, >30 minutes before breakfast, separated by >4 hours from acid reflux medications, calcium, iron, multivitamins. Pt. is taking it correctly.  3.  Vitamin D deficiency -Vitamin D level was as low as 12.4 in the past -Previously on ergocalciferol 50,000 units twice a week but the vitamin D level  returned elevated, at 117 on this so we stopped  this for 2 weeks and restarted it once a week.  However, at last visit, she stopped ergocalciferol completely and restarted in the morning of our visit. -Vitamin D level at that time was slightly low, at 26.81.  I advised her to continue ergocalciferol 50,000 units once a week -She had another level obtained 07/07/2022 and this was normal, at 44.8.   -Will continue her weekly ergocalciferol  Philemon Kingdom, MD PhD [facility] Endocrinology

## 2022-07-17 NOTE — Patient Instructions (Addendum)
Please continue: - Levothyroxine 125 mcg daily.  Take the thyroid hormone every day, with water, at least 30 minutes before breakfast, separated by at least 4 hours from: - acid reflux medications - calcium - iron - multivitamins  Continue Ergocalciferol 50,0000 units weekly.  Take 1 Tums tablet as needed.  Please come back in 1 year.

## 2022-07-25 ENCOUNTER — Encounter: Payer: Self-pay | Admitting: Nurse Practitioner

## 2022-08-07 ENCOUNTER — Encounter: Payer: Self-pay | Admitting: Nurse Practitioner

## 2022-08-21 ENCOUNTER — Other Ambulatory Visit: Payer: Self-pay | Admitting: Internal Medicine

## 2022-09-12 ENCOUNTER — Encounter (HOSPITAL_BASED_OUTPATIENT_CLINIC_OR_DEPARTMENT_OTHER): Payer: Self-pay

## 2022-09-21 ENCOUNTER — Other Ambulatory Visit (HOSPITAL_BASED_OUTPATIENT_CLINIC_OR_DEPARTMENT_OTHER): Payer: Self-pay | Admitting: Family

## 2022-09-21 DIAGNOSIS — I10 Essential (primary) hypertension: Secondary | ICD-10-CM

## 2022-09-25 ENCOUNTER — Encounter (HOSPITAL_BASED_OUTPATIENT_CLINIC_OR_DEPARTMENT_OTHER): Payer: Self-pay

## 2022-09-26 ENCOUNTER — Other Ambulatory Visit: Payer: Self-pay | Admitting: Nurse Practitioner

## 2022-09-26 DIAGNOSIS — E1169 Type 2 diabetes mellitus with other specified complication: Secondary | ICD-10-CM

## 2022-09-30 ENCOUNTER — Encounter: Payer: Self-pay | Admitting: Nurse Practitioner

## 2022-10-01 ENCOUNTER — Other Ambulatory Visit: Payer: Self-pay

## 2022-10-01 DIAGNOSIS — E1165 Type 2 diabetes mellitus with hyperglycemia: Secondary | ICD-10-CM

## 2022-10-01 MED ORDER — OZEMPIC (0.25 OR 0.5 MG/DOSE) 2 MG/3ML ~~LOC~~ SOPN: 0.5000 mg | PEN_INJECTOR | SUBCUTANEOUS | 1 refills | Status: DC

## 2022-10-03 ENCOUNTER — Ambulatory Visit: Payer: Self-pay | Admitting: Nurse Practitioner

## 2022-11-05 ENCOUNTER — Encounter: Payer: Self-pay | Admitting: Nurse Practitioner

## 2022-11-05 ENCOUNTER — Ambulatory Visit: Payer: BC Managed Care – PPO | Admitting: Nurse Practitioner

## 2022-11-05 VITALS — BP 126/72 | HR 85 | Temp 98.8°F | Ht 67.0 in | Wt 199.6 lb

## 2022-11-05 DIAGNOSIS — W19XXXA Unspecified fall, initial encounter: Secondary | ICD-10-CM

## 2022-11-05 DIAGNOSIS — K5904 Chronic idiopathic constipation: Secondary | ICD-10-CM

## 2022-11-05 DIAGNOSIS — E559 Vitamin D deficiency, unspecified: Secondary | ICD-10-CM

## 2022-11-05 DIAGNOSIS — Z2821 Immunization not carried out because of patient refusal: Secondary | ICD-10-CM

## 2022-11-05 DIAGNOSIS — I10 Essential (primary) hypertension: Secondary | ICD-10-CM

## 2022-11-05 DIAGNOSIS — Z6831 Body mass index (BMI) 31.0-31.9, adult: Secondary | ICD-10-CM

## 2022-11-05 DIAGNOSIS — E1169 Type 2 diabetes mellitus with other specified complication: Secondary | ICD-10-CM | POA: Diagnosis not present

## 2022-11-05 DIAGNOSIS — Z872 Personal history of diseases of the skin and subcutaneous tissue: Secondary | ICD-10-CM

## 2022-11-05 DIAGNOSIS — E782 Mixed hyperlipidemia: Secondary | ICD-10-CM | POA: Diagnosis not present

## 2022-11-05 DIAGNOSIS — E039 Hypothyroidism, unspecified: Secondary | ICD-10-CM

## 2022-11-05 DIAGNOSIS — E6609 Other obesity due to excess calories: Secondary | ICD-10-CM

## 2022-11-05 NOTE — Assessment & Plan Note (Signed)
Blood pressure is well controlled, continue f/u with cardiology

## 2022-11-05 NOTE — Progress Notes (Signed)
[name], CMA,acting as a Neurosurgeon for [name], FNP.,have documented all relevant documentation on the behalf of [name], FNP,as directed by  [name], FNP while in the presence of [name], FNP.  Subjective:  Patient ID: Amanda Henry , female    DOB: 12/08/1965 , 57 y.o.   MRN: [id number]  Chief Complaint  Patient presents with   Hypertension   Diabetes    HPI  Patient presents today for a bp and dm check, patient reports compliance with medications and has no other concerns today. Patient denies any chest pain, SOB, and headaches. Denies needing any refills today. 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   Patient reports her eyes are red often she believes its from pollen, she reports her eyes also burn a little. Patient reports she is using eye drops.   BP Readings from Last 3 Encounters: 11/05/22 : 126/72 07/17/22 : 128/80 07/07/22 : 118/74  Wt Readings from Last 3 Encounters: 11/05/22 : 199 lb 9.6 oz (90.5 kg) 07/17/22 : 198 lb (89.8 kg) 07/07/22 : 197 lb (89.4 kg)  Her psychiatrist is weaning her off alprazolam with "gabapentin"   Diabetes She presents for her follow-up diabetic visit. Diabetes type: prediabetes. There are no hypoglycemic associated symptoms. There are no diabetic associated symptoms. Pertinent negatives for diabetes include no chest pain. There are no hypoglycemic complications. Diabetic complications include heart disease. Risk factors for coronary artery disease include obesity, diabetes mellitus and hypertension. Current diabetic treatment includes oral agent (monotherapy). She is following a generally unhealthy diet. When asked about meal planning, she reported none. She has not had a previous visit with a dietitian. She participates in exercise intermittently. (Blood sugars averaging 90-91) She does not see a podiatrist.Eye exam is current.  Hypertension This is a chronic problem. The current episode started more  than 1 year ago. The problem has been gradually improving since onset. The problem is controlled. Pertinent negatives include no anxiety or chest pain. (At night she is hearing piercing sounds in her ear about one week ago. ) Risk factors for coronary artery disease include obesity. Past treatments include calcium channel blockers and angiotensin blockers. There are no compliance problems.  Identifiable causes of hypertension include a thyroid problem. There is no history of chronic renal disease.     Past Medical History:  Diagnosis Date   Anxiety    Blood transfusion without reported diagnosis    Depression    DM2 (diabetes mellitus, type 2) (HCC)    Hypercalcemia 04/03/2021   Hypertension    no medications at this time   Hypokalemia 04/03/2021   Hypothyroidism    Iron deficiency anemia    Obesity    Palpitations    Subarachnoid hemorrhage (HCC)    Thyroid disease    Vitamin D deficiency      Family History  Problem Relation Age of Onset   Thyroid disease Mother    Hypertension Mother    Diabetes Father    Hypertension Other    Diabetes Other    Thyroid disease Other    Heart disease Other      Current Outpatient Medications:    ALPRAZolam (XANAX XR) 1 MG 24 hr tablet, Take 0.5 mg by mouth daily as needed for sleep., Disp: , Rfl:    amLODipine-olmesartan (AZOR) 10-40 MG tablet, Take 1 tablet by mouth daily., Disp: 30 tablet, Rfl: 5   atorvastatin (LIPITOR) 20 MG tablet, Take 1 tablet (20 mg total) by  mouth daily., Disp: 90 tablet, Rfl: 3   azelastine (ASTELIN) 0.1 % nasal spray, Place 2 sprays into both nostrils 2 (two) times daily. Use in each nostril as directed, Disp: 30 mL, Rfl: 2   Blood Glucose Monitoring Suppl (ACCU-CHEK GUIDE ME) w/Device KIT, USE AS DIRECTED FOUR TIMES DAILY, Disp: 1 kit, Rfl: 1   escitalopram (LEXAPRO) 10 MG tablet, Take 1 tablet (10 mg total) by mouth daily., Disp: 30 tablet, Rfl: 1   fexofenadine (ALLEGRA ALLERGY) 180 MG tablet, Take 1 tablet  (180 mg total) by mouth daily., Disp: 90 tablet, Rfl: 1   Glucagon (GVOKE HYPOPEN 2-PACK) 0.5 MG/0.1ML SOAJ, Inject 0.1 mg into the skin as needed., Disp: 0.1 mL, Rfl: 6   glucose blood test strip, Use as instructed, Disp: 100 each, Rfl: 12   hydrochlorothiazide (HYDRODIURIL) 25 MG tablet, Take 1 tablet (25 mg total) by mouth daily., Disp: 90 tablet, Rfl: 3   ipratropium (ATROVENT) 0.06 % nasal spray, Place 2 sprays into the nose 3 (three) times daily., Disp: 15 mL, Rfl: 2   levothyroxine (SYNTHROID) 125 MCG tablet, TAKE 1 TABLET(125 MCG) BY MOUTH DAILY, Disp: 90 tablet, Rfl: 1   MAGNESIUM GLYCINATE PO, Take 500 mg by mouth., Disp: , Rfl:    meclizine (ANTIVERT) 12.5 MG tablet, Take 1 tablet (12.5 mg total) by mouth 3 (three) times daily as needed for dizziness., Disp: 30 tablet, Rfl: 1   metoprolol succinate (TOPROL-XL) 25 MG 24 hr tablet, Take 25 mg by mouth as needed (FOR PALPITATIONS)., Disp: , Rfl:    Semaglutide,0.25 or 0.5MG /DOS, (OZEMPIC, 0.25 OR 0.5 MG/DOSE,) 2 MG/3ML SOPN, Inject 0.5 mg into the skin once a week., Disp: 9 mL, Rfl: 1   Vitamin D, Ergocalciferol, (DRISDOL) 1.25 MG (50000 UNIT) CAPS capsule, TAKE 1 CAPSULE BY MOUTH EVERY 7 DAYS, Disp: 12 capsule, Rfl: 1   Allergies  Allergen Reactions   Latex Rash   Penicillins Rash    Has patient had a PCN reaction causing immediate rash, facial/tongue/throat swelling, SOB or lightheadedness with hypotension: Yes Has patient had a PCN reaction causing severe rash involving mucus membranes or skin necrosis: No Has patient had a PCN reaction that required hospitalization No Has patient had a PCN reaction occurring within the last 10 years: No If all of the above answers are "NO", then may proceed with Cephalosporin use.      Review of Systems  Constitutional: Negative.   HENT: Negative.    Eyes: Negative.   Respiratory: Negative.    Cardiovascular: Negative.  Negative for chest pain.  Gastrointestinal: Negative.      Today's  Vitals   11/05/22 1421  BP: 126/72  Pulse: 85  Temp: 98.8 F (37.1 C)  TempSrc: Oral  Weight: 199 lb 9.6 oz (90.5 kg)  Height: 5\' 7"  (1.702 m)  PainSc: 0-No pain   Body mass index is 31.26 kg/m.  Wt Readings from Last 3 Encounters:  11/05/22 199 lb 9.6 oz (90.5 kg)  07/17/22 198 lb (89.8 kg)  07/07/22 197 lb (89.4 kg)     Objective:  Physical Exam Vitals reviewed.  Constitutional:      General: She is not in acute distress.    Appearance: Normal appearance. She is obese.  Cardiovascular:     Rate and Rhythm: Normal rate and regular rhythm.     Pulses: Normal pulses.     Heart sounds: Normal heart sounds. No murmur heard. Pulmonary:     Effort: Pulmonary effort is normal. No respiratory  distress.     Breath sounds: Normal breath sounds. No wheezing.  Skin:    General: Skin is warm and dry.     Capillary Refill: Capillary refill takes less than 2 seconds.  Neurological:     General: No focal deficit present.     Mental Status: She is alert and oriented to person, place, and time.     Cranial Nerves: No cranial nerve deficit.     Motor: No weakness.  Psychiatric:        Mood and Affect: Mood normal.        Behavior: Behavior normal.        Thought Content: Thought content normal.        Judgment: Judgment normal.         Assessment And Plan:  Type 2 diabetes mellitus with obesity (HCC) Assessment & Plan: HgbA1c was 6.6 at last visit continue Ozempic tolerating well.  Orders: -     Hemoglobin A1c  Mixed hyperlipidemia Assessment & Plan: Will check cholesterol, continue statin, tolerating well.   Orders: -     Lipid panel  Vitamin D deficiency Assessment & Plan: Will check vitamin D level and supplement as needed.    Also encouraged to spend 15 minutes in the sun daily.     Essential hypertension Assessment & Plan: Blood pressure is well controlled, continue f/u with cardiology  Orders: -     Basic metabolic panel  Acquired  hypothyroidism Assessment & Plan: Continue levothyroxine, will check levels. Last visit to Endocrinology was Feb 2024  Orders: -     TSH + free T4  Chronic idiopathic constipation Assessment & Plan: She is encouraged to continue with staying well hydrated    COVID-19 vaccination declined Assessment & Plan: Declines covid 19 vaccine. Discussed risk of covid 33 and if she changes her mind about the vaccine to call the office. Education has been provided regarding the importance of this vaccine but patient still declined. Advised may receive this vaccine at local pharmacy or Health Dept.or vaccine clinic. Aware to provide a copy of the vaccination record if obtained from local pharmacy or Health Dept.  Encouraged to take multivitamin, vitamin d, vitamin c and zinc to increase immune system. Aware can call office if would like to have vaccine here at office. Verbalized acceptance and understanding.    Fall, initial encounter Assessment & Plan: She had a fall at work, went to [employer] and evaluated but everything was okay.    History of psoriasis  Class 1 obesity due to excess calories with serious comorbidity and body mass index (BMI) of 31.0 to 31.9 in adult Assessment & Plan: She is encouraged to strive for BMI less than 30 to decrease cardiac risk. Advised to aim for at least 150 minutes of exercise per week.     Return for 6 month bp check.   Patient was given opportunity to ask questions. Patient verbalized understanding of the plan and was able to repeat key elements of the plan. All questions were answered to their satisfaction.  [name], FNP  I, [name], FNP, have reviewed all documentation for this visit. The documentation on 11/05/22 for the exam, diagnosis, procedures, and orders are all accurate and complete.   IF YOU HAVE BEEN REFERRED TO A SPECIALIST, IT MAY TAKE 1-2 WEEKS TO SCHEDULE/PROCESS THE REFERRAL. IF YOU HAVE NOT HEARD FROM US/SPECIALIST IN TWO  WEEKS, PLEASE GIVE [place] A CALL AT [phone].

## 2022-11-05 NOTE — Assessment & Plan Note (Signed)
Continue levothyroxine, will check levels. Last visit to Endocrinology was Feb 2024

## 2022-11-05 NOTE — Assessment & Plan Note (Signed)
She had a fall at work, went to [employer] and evaluated but everything was okay.

## 2022-11-06 ENCOUNTER — Ambulatory Visit: Payer: BC Managed Care – PPO | Admitting: Nurse Practitioner

## 2022-11-06 LAB — BASIC METABOLIC PANEL
BUN/Creatinine Ratio: 16 (ref 9–23)
BUN: 14 mg/dL (ref 6–24)
CO2: 22 mmol/L (ref 20–29)
Calcium: 9 mg/dL (ref 8.7–10.2)
Chloride: 96 mmol/L (ref 96–106)
Creatinine, Ser: 0.88 mg/dL (ref 0.57–1.00)
Glucose: 100 mg/dL — ABNORMAL HIGH (ref 70–99)
Potassium: 3.7 mmol/L (ref 3.5–5.2)
Sodium: 135 mmol/L (ref 134–144)
eGFR: 78 mL/min/{1.73_m2} (ref >59)

## 2022-11-06 LAB — LIPID PANEL
Chol/HDL Ratio: 3.1 ratio (ref 0.0–4.4)
Cholesterol, Total: 166 mg/dL (ref 100–199)
HDL: 54 mg/dL (ref >39)
LDL Chol Calc (NIH): 90 mg/dL (ref 0–99)
Triglycerides: 124 mg/dL (ref 0–149)
VLDL Cholesterol Cal: 22 mg/dL (ref 5–40)

## 2022-11-06 LAB — TSH+FREE T4
Free T4: 1.34 ng/dL (ref 0.82–1.77)
TSH: 2.14 u[IU]/mL (ref 0.450–4.500)

## 2022-11-06 LAB — HEMOGLOBIN A1C
Est. average glucose Bld gHb Est-mCnc: 143 mg/dL
Hgb A1c MFr Bld: 6.6 % — ABNORMAL HIGH (ref 4.8–5.6)

## 2022-11-12 ENCOUNTER — Encounter (HOSPITAL_BASED_OUTPATIENT_CLINIC_OR_DEPARTMENT_OTHER): Payer: Self-pay

## 2022-11-12 NOTE — Telephone Encounter (Signed)
Sleep team please assist this patient

## 2022-11-14 IMAGING — XA IR CAROTID  INTERNAL HEAD/NECK  RIGHT (MS)
4 series · 13 of 23 positions shown · IV contrast (IODINE)
Comparison: none

PROCEDURE:
DIAGNOSTIC CEREBRAL ANGIOGRAM
HISTORY: The patient is a 56-year-old woman with a history of subarachnoid
hemorrhage approximately 6 months ago who underwent coil
embolization of a small right anterior choroidal artery aneurysm.
The patient has made an excellent neurologic recovery and presents
today for routine short-term angiographic follow-up.
TECHNIQUE: CATHETERS AND WIRES
5-French Yimmons-D glide catheter

[Series 1: n roadmap · 2 of 52 frames shown]
[frame 8/52]
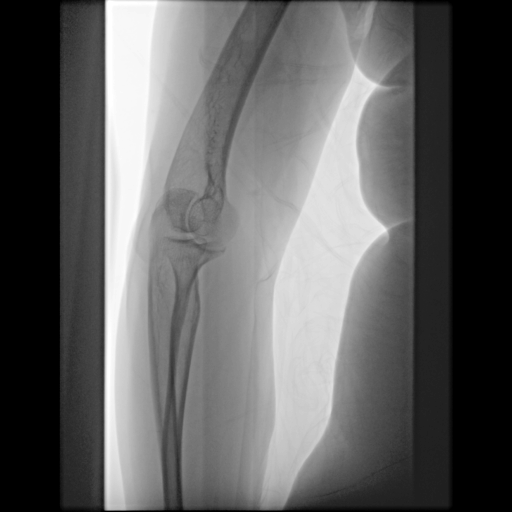
[frame 45/52]
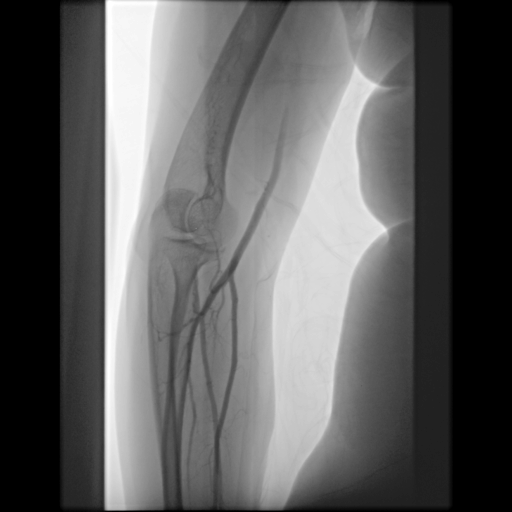

[Series 2: cerebral care 2 · 2 acquisitions, 4 frames shown (1 of 2)]
[im 1/2]
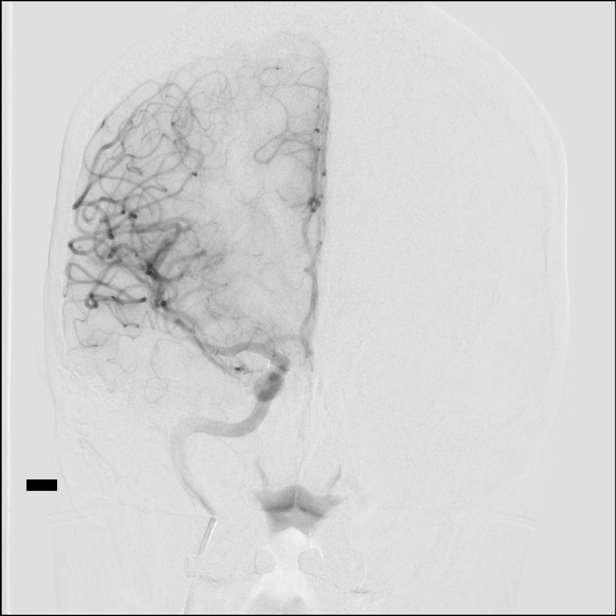
[im 1/2]
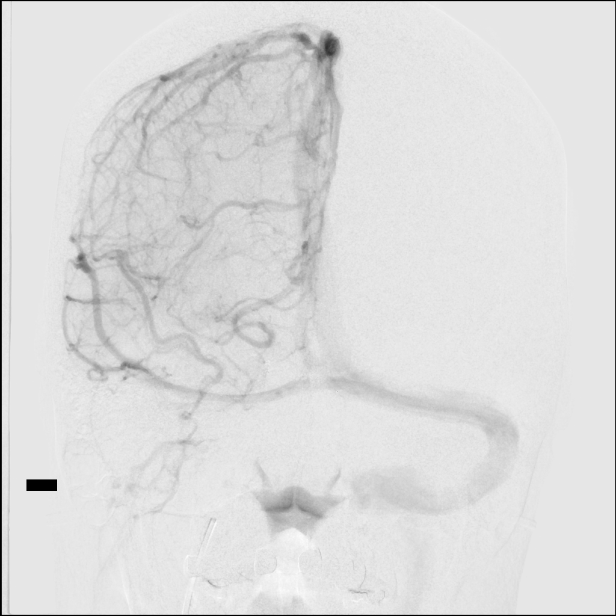
[im 2/2]
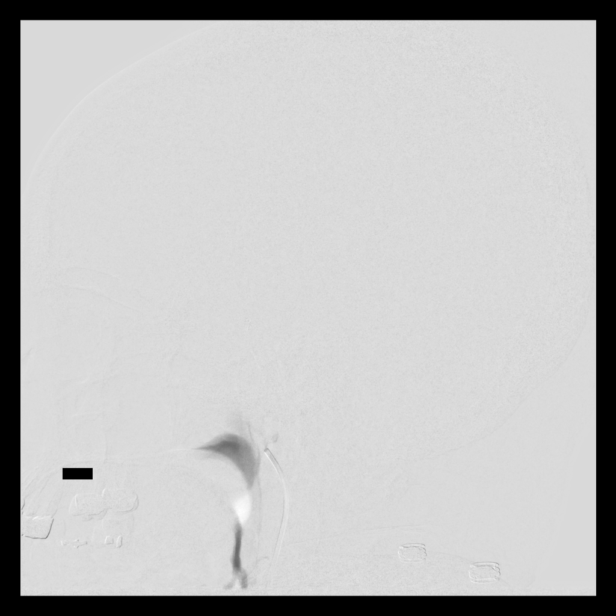
[im 2/2]
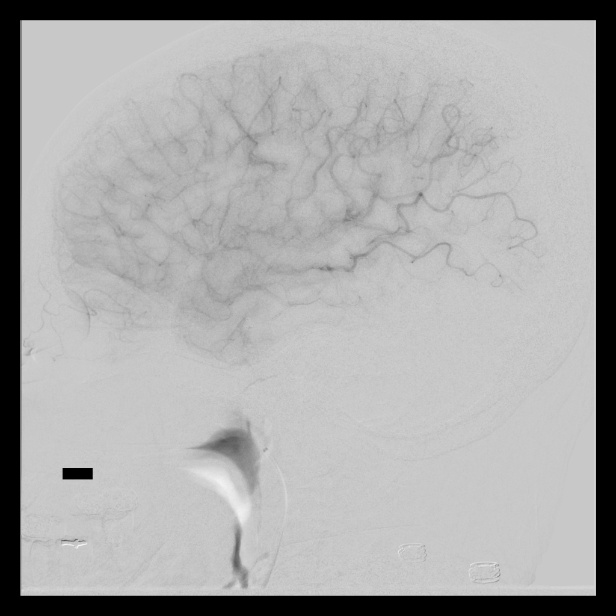

[Series 3: cerebral care 2 · 2 acquisitions, 5 frames shown (2 of 2)]
[im 1/2  full-range]
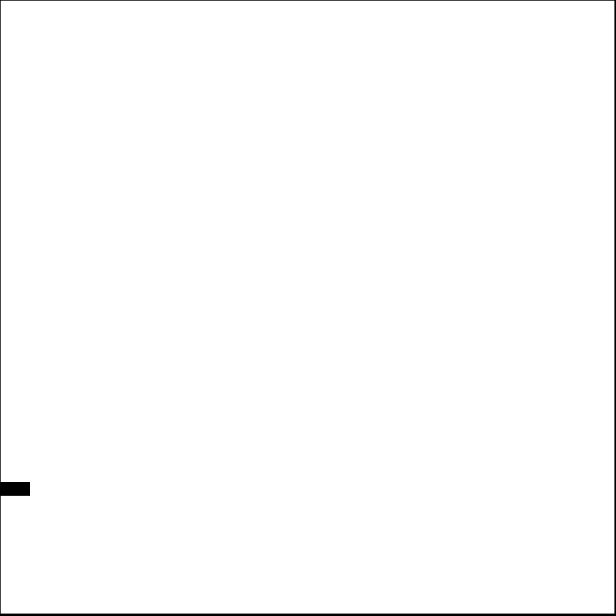
[im 1/2]
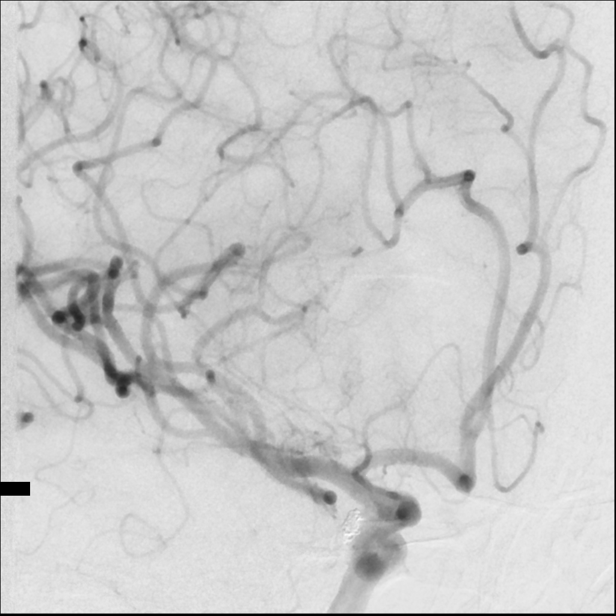
[im 2/2]
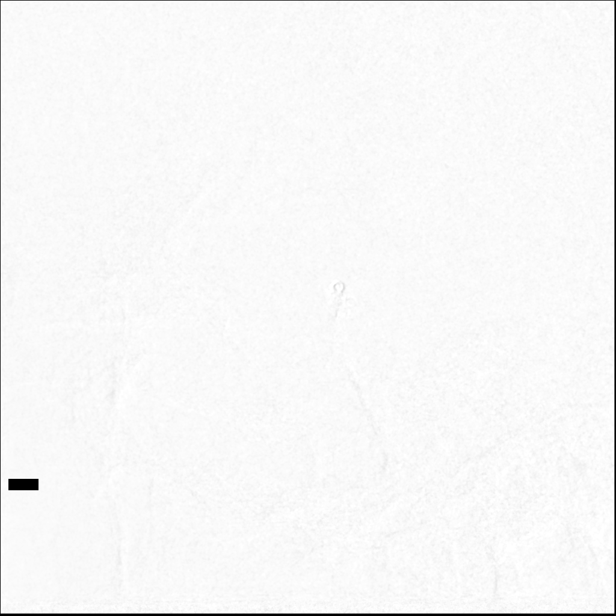
[im 2/2]
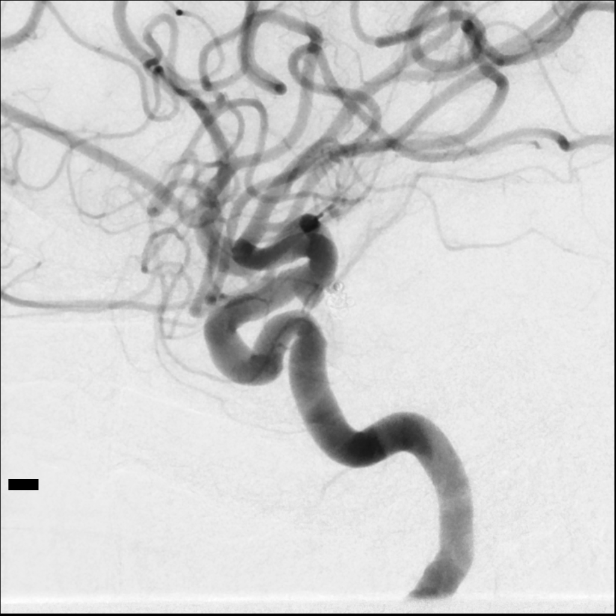
[im 2/2]
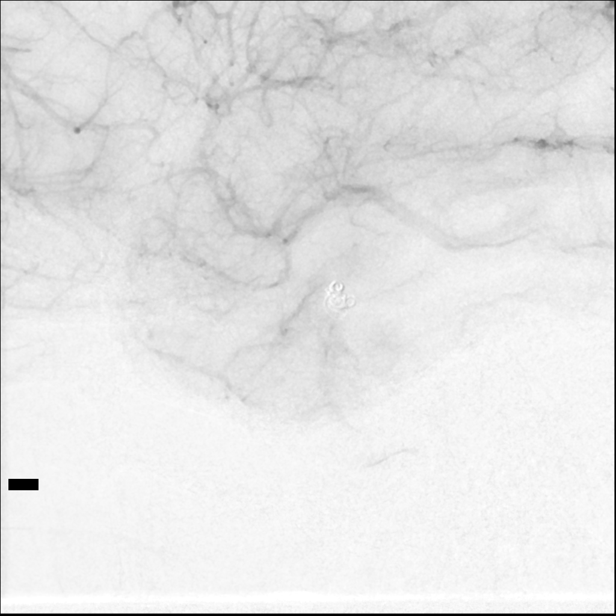

[Series 300: ir angio vertebral sel vertebral bilat m · 2 of 4 slices shown]
[im 2/4]
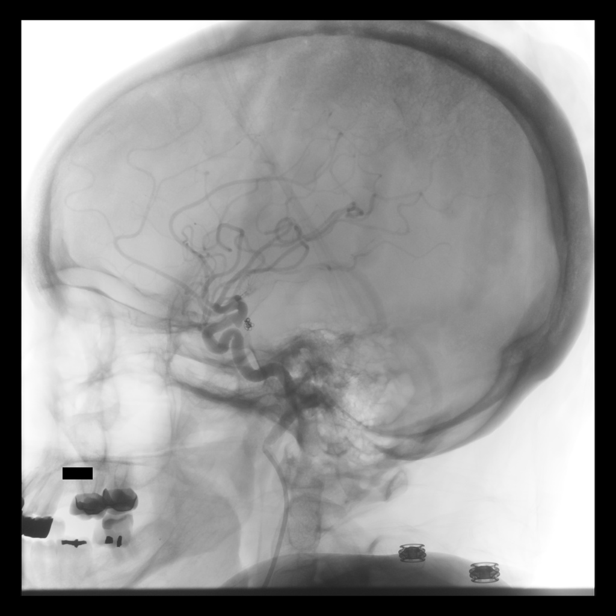
[im 4/4]
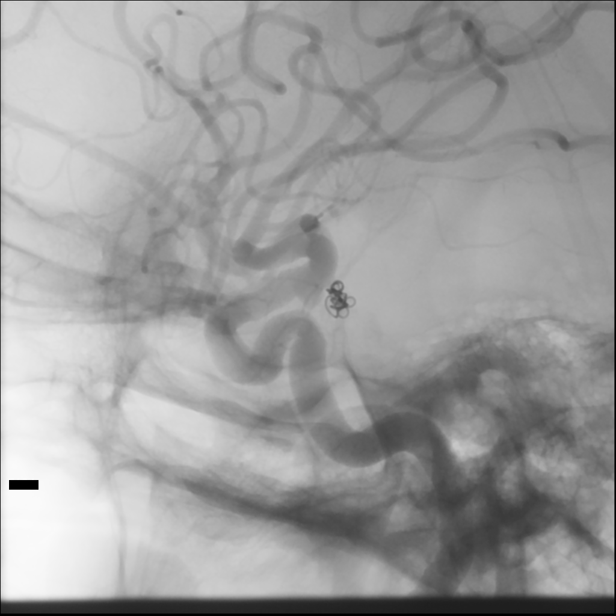

[13 of 23 positions shown; findings below may reference images not displayed]

ACCESS:
The technical aspects of the procedure as well as its potential
risks and benefits were reviewed with the patient. These risks
included but were not limited bleeding, infection, allergic
reaction, damage to organs or vital structures, stroke,
non-diagnostic procedure, and the catastrophic outcomes of heart
attack, coma, and death. With an understanding of these risks,
informed consent was obtained and witnessed. The patient was placed
in the supine position on the angiography table and the skin of
right groin prepped in the usual sterile fashion.

The procedure was performed under local anesthesia (1%-solution of
bicarbonate-buffered Lidocaine) and conscious sedation with 1mg
versed and 29micrograms fentanyl monitored by myself and the
in-suite nurse using continuous pulse-oximetry, heart rate, and
non-invasive blood-pressure.

A 5- French sheath was introduced in the right radial artery under
ultrasound guidance using Seldinger technique in order to directly
visualize access to the lumen of the right radial artery with a
micro puncture needle. A fluoro-phase sequence was used to document
the sheath position.

MEDICATIONS:
Heparin: 7999 Units total.

Verapamil: 2.5mg

Nitroglycerin: 111micrograms

CONTRAST:  20mL OMNIPAQUE IOHEXOL 300 MG/ML  SOLNcc, Omnipaque 300

FLUOROSCOPY TIME:  FLUOROSCOPY TIME: See IR records
0.035" glidewire

VESSELS CATHETERIZED
Right internal carotid

Right radial

VESSELS STUDIED
Right internal carotid, head

Right radial

PROCEDURAL NARRATIVE
A 5-Erynn Joee catheter was advanced over a 0.035 glidewire directly
into the right common carotid artery and under roadmap guidance,
further advanced into the right internal carotid artery. Cerebral
angiograms were then taken. After review of images, the catheter was
removed without incident.
FINDINGS: Right internal carotid: head:

Injection reveals the presence of a widely patent ICA, M1, and A1
segments and their branches. Coil mass is seen at the origin of the
right anterior choroidal artery. No residual or recurrent aneurysm
is noted. There is no coil herniation with into the internal carotid
artery. There is stable appearance of the infundibulum at the origin
of the right posterior communicating artery. The parenchymal and
venous phases are normal. The venous sinuses are widely patent.

Right radial:

Normal vessel. No significant atherosclerotic disease. Arterial
sheath in adequate position.

DISPOSITION:
Upon completion of the study, the radial sheath was removed and
patent hemostasis was obtained with application of the Terumo TR
band. The procedure was well tolerated and no early complications
were observed. The patient was transferred to the holding area for
further care and removal of the TR band.
IMPRESSION: 1. Complete occlusion of a right anterior choroidal artery aneurysms
6 months after coil embolization for subarachnoid hemorrhage.

The preliminary results of this procedure were shared with the
patient and the patient's family.

## 2022-11-14 IMAGING — US IR US GUIDE VASC ACCESS RIGHT
1 series · 2 of 2 positions shown · non-contrast
Comparison: none

PROCEDURE:
DIAGNOSTIC CEREBRAL ANGIOGRAM
HISTORY: The patient is a 56-year-old woman with a history of subarachnoid
hemorrhage approximately 6 months ago who underwent coil
embolization of a small right anterior choroidal artery aneurysm.
The patient has made an excellent neurologic recovery and presents
today for routine short-term angiographic follow-up.
TECHNIQUE: CATHETERS AND WIRES
5-French Yimmons-D glide catheter

[Series 1: ir cnsa ang vrtbl sel vrtbl bi · 2 of 2 slices shown]
[im 1/2]
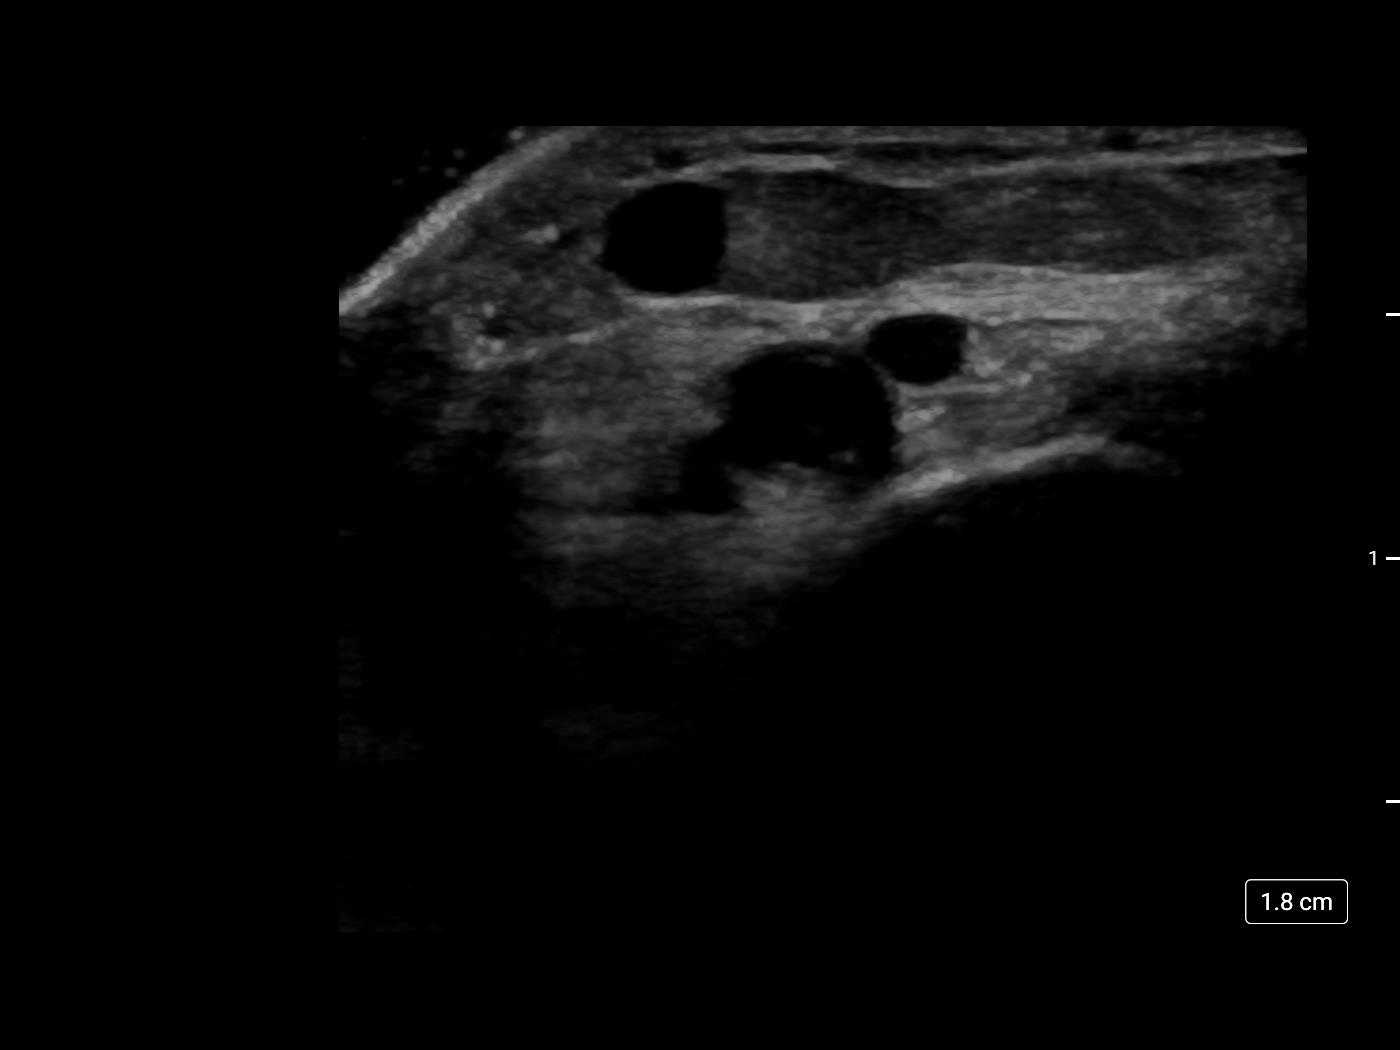
[im 2/2]
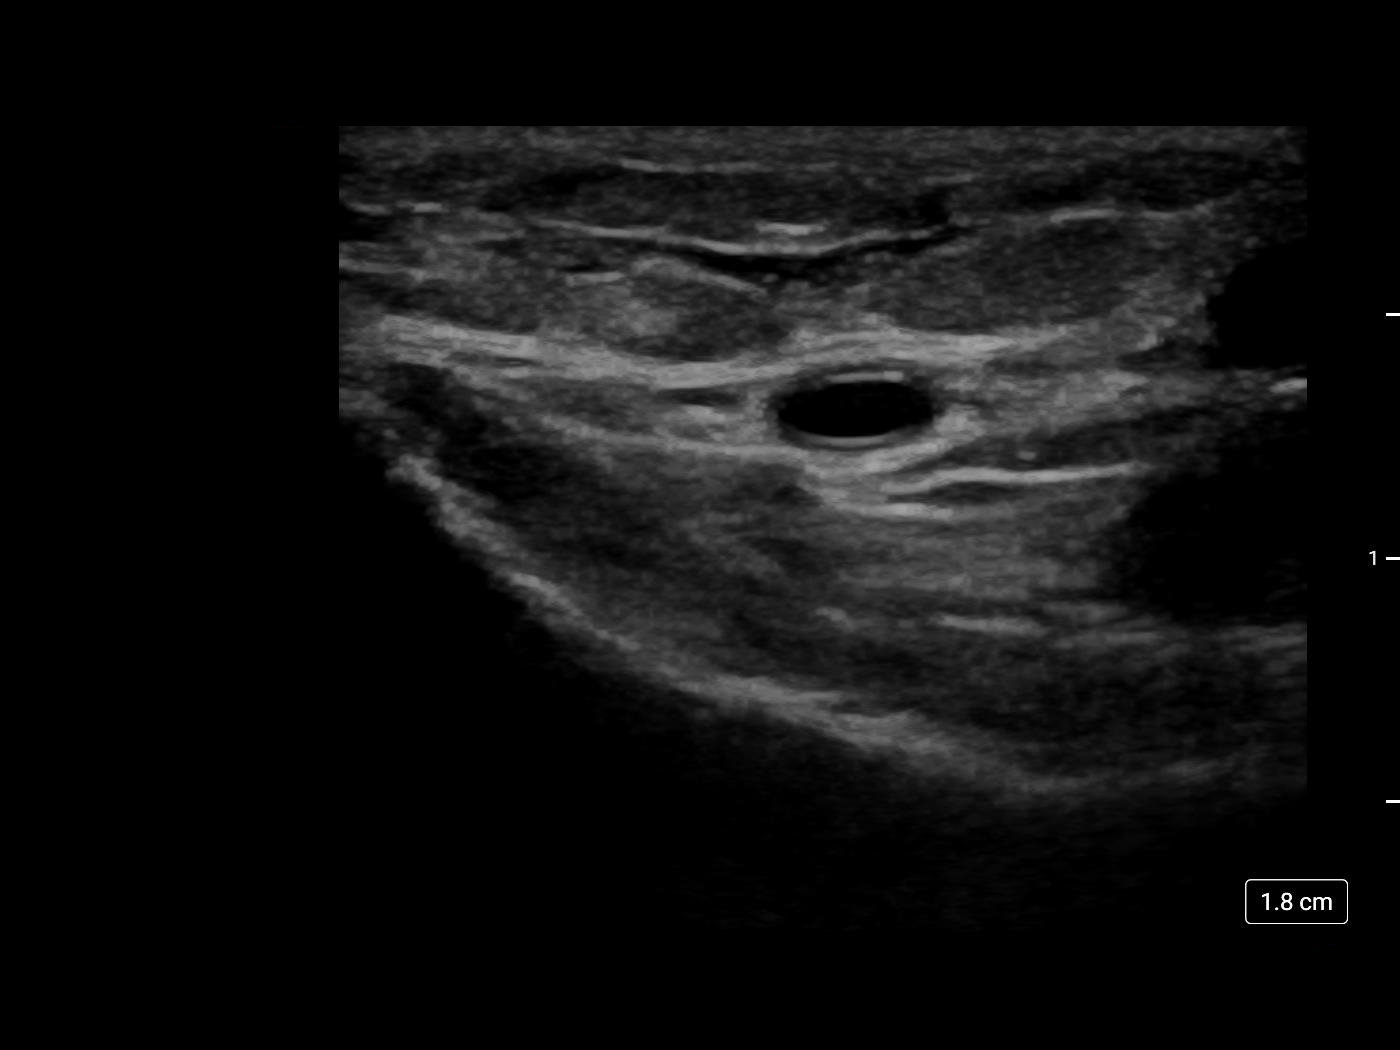

[2 of 2 positions shown; findings below may reference images not displayed]

ACCESS:
The technical aspects of the procedure as well as its potential
risks and benefits were reviewed with the patient. These risks
included but were not limited bleeding, infection, allergic
reaction, damage to organs or vital structures, stroke,
non-diagnostic procedure, and the catastrophic outcomes of heart
attack, coma, and death. With an understanding of these risks,
informed consent was obtained and witnessed. The patient was placed
in the supine position on the angiography table and the skin of
right groin prepped in the usual sterile fashion.

The procedure was performed under local anesthesia (1%-solution of
bicarbonate-buffered Lidocaine) and conscious sedation with 1mg
versed and 29micrograms fentanyl monitored by myself and the
in-suite nurse using continuous pulse-oximetry, heart rate, and
non-invasive blood-pressure.

A 5- French sheath was introduced in the right radial artery under
ultrasound guidance using Seldinger technique in order to directly
visualize access to the lumen of the right radial artery with a
micro puncture needle. A fluoro-phase sequence was used to document
the sheath position.

MEDICATIONS:
Heparin: 7999 Units total.

Verapamil: 2.5mg

Nitroglycerin: 111micrograms

CONTRAST:  20mL OMNIPAQUE IOHEXOL 300 MG/ML  SOLNcc, Omnipaque 300

FLUOROSCOPY TIME:  FLUOROSCOPY TIME: See IR records
0.035" glidewire

VESSELS CATHETERIZED
Right internal carotid

Right radial

VESSELS STUDIED
Right internal carotid, head

Right radial

PROCEDURAL NARRATIVE
A 5-Erynn Joee catheter was advanced over a 0.035 glidewire directly
into the right common carotid artery and under roadmap guidance,
further advanced into the right internal carotid artery. Cerebral
angiograms were then taken. After review of images, the catheter was
removed without incident.
FINDINGS: Right internal carotid: head:

Injection reveals the presence of a widely patent ICA, M1, and A1
segments and their branches. Coil mass is seen at the origin of the
right anterior choroidal artery. No residual or recurrent aneurysm
is noted. There is no coil herniation with into the internal carotid
artery. There is stable appearance of the infundibulum at the origin
of the right posterior communicating artery. The parenchymal and
venous phases are normal. The venous sinuses are widely patent.

Right radial:

Normal vessel. No significant atherosclerotic disease. Arterial
sheath in adequate position.

DISPOSITION:
Upon completion of the study, the radial sheath was removed and
patent hemostasis was obtained with application of the Terumo TR
band. The procedure was well tolerated and no early complications
were observed. The patient was transferred to the holding area for
further care and removal of the TR band.
IMPRESSION: 1. Complete occlusion of a right anterior choroidal artery aneurysms
6 months after coil embolization for subarachnoid hemorrhage.

The preliminary results of this procedure were shared with the
patient and the patient's family.

## 2022-11-14 NOTE — Assessment & Plan Note (Signed)
Declines covid 19 vaccine. Discussed risk of covid 62 and if she changes her mind about the vaccine to call the office. Education has been provided regarding the importance of this vaccine but patient still declined. Advised may receive this vaccine at local pharmacy or Health Dept.or vaccine clinic. Aware to provide a copy of the vaccination record if obtained from local pharmacy or Health Dept.  Encouraged to take multivitamin, vitamin d, vitamin c and zinc to increase immune system. Aware can call office if would like to have vaccine here at office. Verbalized acceptance and understanding.

## 2022-11-14 NOTE — Assessment & Plan Note (Signed)
Will check cholesterol, continue statin, tolerating well.

## 2022-11-14 NOTE — Assessment & Plan Note (Signed)
She is encouraged to strive for BMI less than 30 to decrease cardiac risk. Advised to aim for at least 150 minutes of exercise per week.  

## 2022-11-14 NOTE — Assessment & Plan Note (Signed)
HgbA1c was 6.6 at last visit continue Ozempic tolerating well.

## 2022-11-14 NOTE — Assessment & Plan Note (Signed)
She is encouraged to continue with staying well hydrated

## 2022-11-14 NOTE — Assessment & Plan Note (Signed)
Will check vitamin D level and supplement as needed.    Also encouraged to spend 15 minutes in the sun daily.   

## 2022-11-16 ENCOUNTER — Encounter: Payer: Self-pay | Admitting: Nurse Practitioner

## 2022-11-25 ENCOUNTER — Other Ambulatory Visit: Payer: Self-pay | Admitting: Nurse Practitioner

## 2022-11-25 DIAGNOSIS — E669 Obesity, unspecified: Secondary | ICD-10-CM

## 2022-11-25 MED ORDER — SEMAGLUTIDE (1 MG/DOSE) 4 MG/3ML ~~LOC~~ SOPN: 1.0000 mg | PEN_INJECTOR | SUBCUTANEOUS | 1 refills | Status: DC

## 2022-12-21 ENCOUNTER — Other Ambulatory Visit (HOSPITAL_BASED_OUTPATIENT_CLINIC_OR_DEPARTMENT_OTHER): Payer: Self-pay | Admitting: Family

## 2022-12-21 DIAGNOSIS — I10 Essential (primary) hypertension: Secondary | ICD-10-CM

## 2023-01-02 ENCOUNTER — Other Ambulatory Visit (HOSPITAL_BASED_OUTPATIENT_CLINIC_OR_DEPARTMENT_OTHER): Payer: Self-pay | Admitting: Family

## 2023-01-04 ENCOUNTER — Encounter (HOSPITAL_BASED_OUTPATIENT_CLINIC_OR_DEPARTMENT_OTHER): Payer: Self-pay

## 2023-01-05 NOTE — Telephone Encounter (Signed)
[place],   Can you follow up with this patient she has CPAP concerns.

## 2023-01-09 ENCOUNTER — Encounter: Payer: Self-pay | Admitting: Nurse Practitioner

## 2023-01-13 ENCOUNTER — Encounter: Payer: Self-pay | Admitting: Internal Medicine

## 2023-01-13 ENCOUNTER — Telehealth (HOSPITAL_BASED_OUTPATIENT_CLINIC_OR_DEPARTMENT_OTHER): Payer: Self-pay | Admitting: *Deleted

## 2023-01-13 ENCOUNTER — Encounter: Payer: Self-pay | Admitting: Family Medicine

## 2023-01-13 ENCOUNTER — Ambulatory Visit: Payer: BC Managed Care – PPO | Admitting: Family Medicine

## 2023-01-13 ENCOUNTER — Encounter (HOSPITAL_BASED_OUTPATIENT_CLINIC_OR_DEPARTMENT_OTHER): Payer: Self-pay

## 2023-01-13 VITALS — BP 110/60 | HR 73 | Temp 98.1°F | Ht 67.0 in | Wt 200.0 lb

## 2023-01-13 DIAGNOSIS — Z6831 Body mass index (BMI) 31.0-31.9, adult: Secondary | ICD-10-CM | POA: Diagnosis not present

## 2023-01-13 DIAGNOSIS — E6609 Other obesity due to excess calories: Secondary | ICD-10-CM

## 2023-01-13 DIAGNOSIS — R109 Unspecified abdominal pain: Secondary | ICD-10-CM | POA: Diagnosis not present

## 2023-01-13 DIAGNOSIS — E213 Hyperparathyroidism, unspecified: Secondary | ICD-10-CM

## 2023-01-13 DIAGNOSIS — K5904 Chronic idiopathic constipation: Secondary | ICD-10-CM

## 2023-01-13 MED ORDER — POLYETHYLENE GLYCOL 3350 17 G PO PACK: 17.0000 g | PACK | Freq: Every day | ORAL | 1 refills | Status: AC

## 2023-01-13 NOTE — Progress Notes (Signed)
I,Jameka J Llittleton, CMA,acting as a Neurosurgeon for [employer], NP.,have documented all relevant documentation on the behalf of Ellender Hose, NP,as directed by  Ellender Hose, NP while in the presence of Ellender Hose, NP.  Subjective:  Patient ID: Amanda Henry , female    DOB: [birth year] , 57 y.o.   MRN: [id number]  Chief Complaint  Patient presents with   Abdominal Pain    HPI  Patient presents today for abdominal pain that started Friday. Patient reports she was constipated, nauseous and went to a urgent care where she was advised to get some Miralax to use. She reports that she had previously bought Dulcolax and took a tablet and it helped her have  a small bowel movement and this gave her so relief from the pain. Patient reports that sometimes she still feels a little nauseous but she thinks she needs to have more bowel movement.       Past Medical History:  Diagnosis Date   Anxiety    Blood transfusion without reported diagnosis    Depression    DM2 (diabetes mellitus, type 2) (HCC)    Hypercalcemia 04/03/2021   Hypertension    no medications at this time   Hypokalemia 04/03/2021   Hypothyroidism    Iron deficiency anemia    Obesity    Palpitations    Subarachnoid hemorrhage (HCC)    Thyroid disease    Vitamin D deficiency      Family History  Problem Relation Age of Onset   Thyroid disease Mother    Hypertension Mother    Diabetes Father    Hypertension Other    Diabetes Other    Thyroid disease Other    Heart disease Other      Current Outpatient Medications:    ALPRAZolam (XANAX XR) 1 MG 24 hr tablet, Take 0.5 mg by mouth daily as needed for sleep., Disp: , Rfl:    amLODipine-olmesartan (AZOR) 10-40 MG tablet, TAKE 1 TABLET BY MOUTH DAILY, Disp: 90 tablet, Rfl: 1   atorvastatin (LIPITOR) 20 MG tablet, Take 1 tablet (20 mg total) by mouth daily., Disp: 90 tablet, Rfl: 3   azelastine (ASTELIN) 0.1 % nasal spray, Place 2 sprays into both nostrils 2 (two) times  daily. Use in each nostril as directed, Disp: 30 mL, Rfl: 2   Blood Glucose Monitoring Suppl (ACCU-CHEK GUIDE ME) w/Device KIT, USE AS DIRECTED FOUR TIMES DAILY, Disp: 1 kit, Rfl: 1   escitalopram (LEXAPRO) 10 MG tablet, Take 1 tablet (10 mg total) by mouth daily., Disp: 30 tablet, Rfl: 1   fexofenadine (ALLEGRA ALLERGY) 180 MG tablet, Take 1 tablet (180 mg total) by mouth daily., Disp: 90 tablet, Rfl: 1   Glucagon (GVOKE HYPOPEN 2-PACK) 0.5 MG/0.1ML SOAJ, Inject 0.1 mg into the skin as needed., Disp: 0.1 mL, Rfl: 6   glucose blood test strip, Use as instructed, Disp: 100 each, Rfl: 12   hydrochlorothiazide (HYDRODIURIL) 25 MG tablet, Take 1 tablet (25 mg total) by mouth daily., Disp: 90 tablet, Rfl: 3   ipratropium (ATROVENT) 0.06 % nasal spray, Place 2 sprays into the nose 3 (three) times daily., Disp: 15 mL, Rfl: 2   levothyroxine (SYNTHROID) 125 MCG tablet, TAKE 1 TABLET(125 MCG) BY MOUTH DAILY, Disp: 90 tablet, Rfl: 1   MAGNESIUM GLYCINATE PO, Take 500 mg by mouth., Disp: , Rfl:    meclizine (ANTIVERT) 12.5 MG tablet, Take 1 tablet (12.5 mg total) by mouth 3 (three) times daily as needed for dizziness., Disp: 30  tablet, Rfl: 1   metoprolol succinate (TOPROL-XL) 25 MG 24 hr tablet, TAKE 1 TABLET(25 MG) BY MOUTH DAILY, Disp: 90 tablet, Rfl: 0   OXcarbazepine (TRILEPTAL) 150 MG tablet, Take 150 mg by mouth at bedtime., Disp: , Rfl:    polyethylene glycol (MIRALAX) 17 g packet, Take 17 g by mouth daily., Disp: 90 packet, Rfl: 1   Semaglutide, 1 MG/DOSE, 4 MG/3ML SOPN, Inject 1 mg into the skin once a week., Disp: 9 mL, Rfl: 1   Vitamin D, Ergocalciferol, (DRISDOL) 1.25 MG (50000 UNIT) CAPS capsule, TAKE 1 CAPSULE BY MOUTH EVERY 7 DAYS, Disp: 12 capsule, Rfl: 1   Allergies  Allergen Reactions   Latex Rash   Penicillins Rash    Has patient had a PCN reaction causing immediate rash, facial/tongue/throat swelling, SOB or lightheadedness with hypotension: Yes Has patient had a PCN reaction causing  severe rash involving mucus membranes or skin necrosis: No Has patient had a PCN reaction that required hospitalization No Has patient had a PCN reaction occurring within the last 10 years: No If all of the above answers are "NO", then may proceed with Cephalosporin use.      Review of Systems  Constitutional: Negative.   HENT: Negative.    Respiratory: Negative.    Gastrointestinal:  Positive for abdominal pain, constipation and nausea.  Endocrine: Negative.   Genitourinary: Negative.   Skin: Negative.   Psychiatric/Behavioral: Negative.       Today's Vitals   01/13/23 1506  BP: 110/60  Pulse: 73  Temp: 98.1 F (36.7 C)  Weight: 200 lb (90.7 kg)  Height: 5\' 7"  (1.702 m)  PainSc: 0-No pain   Body mass index is 31.32 kg/m.  Wt Readings from Last 3 Encounters:  01/13/23 200 lb (90.7 kg)  11/05/22 199 lb 9.6 oz (90.5 kg)  07/17/22 198 lb (89.8 kg)    The 10-year ASCVD risk score (Arnett DK, et al., 2019) is: 5.9%   Values used to calculate the score:     Age: 77 years     Sex: Female     Is Non-Hispanic African American: Yes     Diabetic: Yes     Tobacco smoker: No     Systolic Blood Pressure: 110 mmHg     Is BP treated: Yes     HDL Cholesterol: 54 mg/dL     Total Cholesterol: 166 mg/dL  Objective:  Physical Exam Constitutional:      Appearance: Normal appearance.  Cardiovascular:     Rate and Rhythm: Normal rate and regular rhythm.     Pulses: Normal pulses.     Heart sounds: Normal heart sounds.  Pulmonary:     Effort: Pulmonary effort is normal.     Breath sounds: Normal breath sounds.  Abdominal:     General: There is distension.  Skin:    General: Skin is warm and dry.  Neurological:     Mental Status: She is alert and oriented to person, place, and time.         Assessment And Plan:  Abdominal pain, unspecified abdominal location Assessment & Plan: Improved since having BM   Chronic idiopathic constipation -     Polyethylene Glycol 3350;  Take 17 g by mouth daily.  Dispense: 90 packet; Refill: 1  Class 1 obesity due to excess calories with serious comorbidity and body mass index (BMI) of 31.0 to 31.9 in adult Assessment & Plan: She is encouraged to strive for BMI less than 30 to decrease  cardiac risk. Advised to aim for at least 150 minutes of exercise per week.      Return if symptoms worsen or fail to improve, for Keep Appointment.  Patient was given opportunity to ask questions. Patient verbalized understanding of the plan and was able to repeat key elements of the plan. All questions were answered to their satisfaction.    I, Ellender Hose, NP, have reviewed all documentation for this visit. The documentation on 01/19/23 for the exam, diagnosis, procedures, and orders are all accurate and complete.   IF YOU HAVE BEEN REFERRED TO A SPECIALIST, IT MAY TAKE 1-2 WEEKS TO SCHEDULE/PROCESS THE REFERRAL. IF YOU HAVE NOT HEARD FROM US/SPECIALIST IN TWO WEEKS, PLEASE GIVE [place] A CALL AT [phone].

## 2023-01-13 NOTE — Telephone Encounter (Signed)
Order was sent to [facility] on 06/16/22. I spoke with patient and notified that order was resent to [facility] 01/13/23. If she hasn't heard anything within 24 hours to reach back out to me, to have order sent to a different DME. All questions were answered, patient verbalized understanding.

## 2023-01-13 NOTE — Telephone Encounter (Signed)
Patient has sent several mychart messages dating back to June regarding sleep issues Amanda Henry is they are seeing mychart message, will forward phone message so someone can reach out to patient to follow up

## 2023-01-14 NOTE — Telephone Encounter (Signed)
Left VM, explaining [facility] has reached out several times in an attempt to get product and supplies to patient. Left contact number for [facility] on VM for patient.

## 2023-01-14 NOTE — Telephone Encounter (Signed)
Last set of Thyroid labs were done in June 2024

## 2023-01-15 ENCOUNTER — Other Ambulatory Visit (INDEPENDENT_AMBULATORY_CARE_PROVIDER_SITE_OTHER): Payer: BC Managed Care – PPO

## 2023-01-15 DIAGNOSIS — E213 Hyperparathyroidism, unspecified: Secondary | ICD-10-CM | POA: Diagnosis not present

## 2023-01-15 LAB — T4, FREE: Free T4: 1.15 ng/dL (ref 0.60–1.60)

## 2023-01-15 LAB — TSH: TSH: 2.94 u[IU]/mL (ref 0.35–5.50)

## 2023-01-16 ENCOUNTER — Encounter: Payer: Self-pay | Admitting: Internal Medicine

## 2023-01-19 DIAGNOSIS — R109 Unspecified abdominal pain: Secondary | ICD-10-CM | POA: Insufficient documentation

## 2023-01-19 NOTE — Assessment & Plan Note (Signed)
Improved since having BM

## 2023-01-19 NOTE — Assessment & Plan Note (Addendum)
She is encouraged to strive for BMI less than 30 to decrease cardiac risk. Advised to aim for at least 150 minutes of exercise per week.  

## 2023-01-21 ENCOUNTER — Telehealth: Payer: Self-pay

## 2023-01-21 ENCOUNTER — Encounter: Payer: Self-pay | Admitting: Nurse Practitioner

## 2023-01-21 NOTE — Telephone Encounter (Signed)
Patient left message bout feeling light head and dizzy. Patients call was returned, patient didn't answer- Voicemail was left.

## 2023-01-22 ENCOUNTER — Other Ambulatory Visit: Payer: Self-pay | Admitting: Nurse Practitioner

## 2023-01-22 MED ORDER — SEMAGLUTIDE(0.25 OR 0.5MG/DOS) 2 MG/3ML ~~LOC~~ SOPN: 0.5000 mg | PEN_INJECTOR | SUBCUTANEOUS | 2 refills | Status: DC

## 2023-02-05 ENCOUNTER — Encounter: Payer: Self-pay | Admitting: Internal Medicine

## 2023-02-06 ENCOUNTER — Encounter: Payer: Self-pay | Admitting: Nurse Practitioner

## 2023-02-09 NOTE — Telephone Encounter (Signed)
Please Advise, it looks like you treat her for Thyroid.

## 2023-02-09 NOTE — Telephone Encounter (Signed)
Please add this patient to Dr. Reece Agar Schedule

## 2023-02-13 ENCOUNTER — Encounter: Payer: Self-pay | Admitting: Nurse Practitioner

## 2023-02-13 ENCOUNTER — Encounter (HOSPITAL_BASED_OUTPATIENT_CLINIC_OR_DEPARTMENT_OTHER): Payer: Self-pay

## 2023-02-25 ENCOUNTER — Ambulatory Visit: Payer: BC Managed Care – PPO | Admitting: Internal Medicine

## 2023-02-25 ENCOUNTER — Encounter: Payer: Self-pay | Admitting: Internal Medicine

## 2023-02-25 VITALS — BP 120/70 | HR 67 | Ht 67.0 in | Wt 200.6 lb

## 2023-02-25 DIAGNOSIS — E1165 Type 2 diabetes mellitus with hyperglycemia: Secondary | ICD-10-CM | POA: Diagnosis not present

## 2023-02-25 DIAGNOSIS — E213 Hyperparathyroidism, unspecified: Secondary | ICD-10-CM | POA: Diagnosis not present

## 2023-02-25 DIAGNOSIS — E559 Vitamin D deficiency, unspecified: Secondary | ICD-10-CM | POA: Diagnosis not present

## 2023-02-25 DIAGNOSIS — E039 Hypothyroidism, unspecified: Secondary | ICD-10-CM

## 2023-02-25 LAB — VITAMIN D 25 HYDROXY (VIT D DEFICIENCY, FRACTURES): VITD: 55.88 ng/mL (ref 30.00–100.00)

## 2023-02-25 MED ORDER — VITAMIN D (ERGOCALCIFEROL) 1.25 MG (50000 UNIT) PO CAPS
50000.0000 [IU] | ORAL_CAPSULE | ORAL | 3 refills | Status: AC
  Filled 2023-11-21: qty 15, 105d supply, fill #0
  Filled 2024-02-12: qty 12, 84d supply, fill #1

## 2023-02-25 MED ORDER — LEVOTHYROXINE SODIUM 125 MCG PO TABS
125.0000 ug | ORAL_TABLET | Freq: Every day | ORAL | 3 refills | Status: DC
  Filled 2023-11-21: qty 90, 90d supply, fill #0
  Filled 2024-02-25: qty 90, 90d supply, fill #1

## 2023-02-25 NOTE — Progress Notes (Unsigned)
Patient ID: Amanda Henry, female   DOB: May 27, 1965, 57 y.o.   MRN: [id number]   HPI  Amanda Henry is a 57 y.o.-year-old female, initially referred referred by her PCP, [name], FNP, returning for f/u for hypercalcemia/hyperparathyroidism and also hypothyroidism. Last OV 7 months ago.  Interim history: At today's visit, she feels well, without complaints except occasional dysphagia, not consistent.  She is scheduled this appointment, to also review her diabetes.   Pt was dx with hypercalcemia in 2018 and hyperparathyroidism in 05/2019.   She described being very tired, having a whooshing sound in her head, tremor, fell out of bed  - lost consciousness, had urine loss and headache  - taken to the hospital >> found to have high blood pressure and a subarachnoid hemorrhage - this was coiled. While in the hospital, calcium was undetectably high (>15).    A parathyroid adenoma was resected while in-house in 03/2021.  Surprisingly, final pathology was benign.  After surgery, she had persistent tingling throughout her body, but this resolved.  I reviewed pt's pertinent labs: Ionized calcium: Lab Results  Component Value Date   CAION 5.40 04/10/2021   CAION 1.83 (HH) 03/15/2021   Lab Results  Component Value Date   PTH 26 07/12/2021   PTH Comment 07/12/2021   PTH 311 (H) 04/03/2021   PTH Comment 04/03/2021   PTH 163 (H) 11/28/2020   PTH 163 (H) 06/14/2019   CALCIUM 9.0 11/05/2022   CALCIUM 9.4 07/03/2022   CALCIUM 9.6 06/12/2022   CALCIUM 9.4 02/19/2022   CALCIUM 9.4 12/04/2021   CALCIUM 8.7 (L) 10/29/2021   CALCIUM 9.8 10/12/2021   CALCIUM 9.9 08/26/2021   CALCIUM 9.2 07/12/2021   CALCIUM 9.1 04/15/2021   Lab Results  Component Value Date   PHOS 2.7 04/10/2021   PHOS 1.8 (L) 04/07/2021   PHOS <1.0 (LL) 04/05/2021   PHOS 1.7 (L) 04/03/2021   PHOS 3.0 04/01/2021   PHOS 1.6 (L) 11/28/2020   Component     Latest Ref Rng & Units 11/28/2020  Calcium     8.6 - 10.4  mg/dL 62.9 (HH)  Vitamin D 1, 25 (OH) Total     18 - 72 pg/mL 127 (H)  Vitamin D3 1, 25 (OH)     pg/mL 19  Vitamin D2 1, 25 (OH)     pg/mL 108  Vitamin D, 25-Hydroxy     30.0 - 100.0 ng/mL 32.6  PTH, Intact     15 - 65 pg/mL 163 (H)  Phosphorus     2.3 - 4.6 mg/dL 1.6 (L)  Magnesium     1.5 - 2.5 mg/dL 2.0   Component     Latest Ref Rng & Units 12/03/2020  Creatinine, 24H Ur     0.50 - 2.15 g/24 h 1.43  Calcium, 24H Urine     mg/24 h 390 (H)  FECa 0.216%  04/04/2021: Technetium sestamibi scan: There is a focus of persistent/delayed uptake in the lower right thyroid lobe consistent with a parathyroid adenoma. This appears to correlate with the CT and ultrasound findings of a 2 cm nodule in this area.  She had right inferior parathyroidectomy by Dr. Gerrit Friends on 04/06/2021: A. PARATHYROID, RIGHT INFERIOR, ADENOMA, PARATHYROIDECTOMY:  - Parathyroid adenoma, 4.36 g.  See comment  - Scant thyroid tissue with lymphocytic thyroiditis - no evidence of  malignancy   The parathyroid adenoma shows a few thick fibrous bands which is one of  the features of an atypical adenoma but it does  not show other atypical  features such as necrosis, nuclear atypia or increased mitotic activity.  Dr. Luisa Hart reviewed the case and concurs with the diagnosis.  Clinical  correlation is suggested   No h/o osteoporosis.  No DXA scans available for review. No fractures or falls.  No h/o kidney stones.  No h/o CKD. Last BUN/Cr: Lab Results  Component Value Date   BUN 14 11/05/2022   BUN 22 07/03/2022   CREATININE 0.88 11/05/2022   CREATININE 0.95 07/03/2022   Of note, her alkaline phosphatase was elevated once, but at that time she also had a low vitamin D: Lab Results  Component Value Date   ALKPHOS 107 02/19/2022   ALKPHOS 75 12/04/2021   ALKPHOS 91 10/12/2021   ALKPHOS 108 04/08/2021   ALKPHOS 97 04/07/2021   ALKPHOS 109 04/06/2021   ALKPHOS 118 04/03/2021   ALKPHOS 186 (H) 05/31/2019    ALKPHOS 79 09/10/2016   ALKPHOS 66 08/28/2016   SPEP was normal: Component     Latest Ref Rng & Units 05/31/2019 06/14/2019  Total Protein     6.0 - 8.5 g/dL 7.3 7.3  Albumin ELP     2.9 - 4.4 g/dL 4.0 3.9  Alpha 1     0.0 - 0.4 g/dL 0.2 0.2  Alpha 2     0.4 - 1.0 g/dL 0.6 0.7  Beta     0.7 - 1.3 g/dL 1.0 1.1  Gamma Globulin     0.4 - 1.8 g/dL 1.5 1.5  M-SPIKE, %     Not Observed g/dL Not Observed Not Observed  Globulin, Total     2.2 - 3.9 g/dL 3.3 3.4  A/G Ratio     0.7 - 1.7 1.2 1.1   Pt is on HCTZ.  Vitamin D deficiency:  Reviewed vit D levels: Lab Results  Component Value Date   VD25OH 44.8 07/07/2022   VD25OH 26.81 (L) 07/12/2021   VD25OH 113.24 (H) 04/03/2021   VD25OH 32.6 11/28/2020   VD25OH 24.6 (L) 10/19/2020   VD25OH 17.2 (L) 09/25/2020   VD25OH 28.3 (L) 02/14/2020   VD25OH 23.3 (L) 11/28/2019   VD25OH 12.4 (L) 05/31/2019   Pt is on ergocalciferol 50,000 units once a week, then stopped in 03/2021 - restarted right before last visit.  We continued it.  Pt does not have a FH of hypercalcemia, pituitary tumors, thyroid cancer, or osteoporosis.   Hypothyroidism:  - s/p RAI tx for Graves 1991  She continues on levothyroxine 125 mcg daily (decreased from 137 mcg daily in 12/2021): - fasting - at least 30 min from b'fast - prev. Ca 200 mg 2x a day  >> Tums tablet in am >> off now (forgot) - no Fe - off MVI - no PPIs  - stopped Biotin  Reviewed her TFTs : Lab Results  Component Value Date   TSH 2.94 01/15/2023   TSH 2.140 11/05/2022   TSH 1.210 07/07/2022   TSH 0.842 02/19/2022   TSH 0.27 (L) 12/25/2021   TSH 0.350 12/04/2021   TSH 0.225 (L) 11/05/2021   TSH 0.86 07/12/2021   TSH 0.403 04/04/2021   TSH 2.090 10/19/2020   TSH 14.800 (H) 09/25/2020   TSH 0.33 (L) 02/14/2020   TSH 0.219 (L) 11/28/2019   TSH 0.622 05/31/2019   TSH 1.900 10/21/2018   TSH 6.510 (H) 09/07/2018   TSH 25.010 (H) 06/03/2018   Lab Results  Component Value Date    FREET4 1.15 01/15/2023   FREET4 1.34 11/05/2022  FREET4 1.39 07/07/2022   FREET4 1.29 02/19/2022   FREET4 1.57 12/25/2021   FREET4 0.98 07/12/2021   FREET4 1.38 10/19/2020   FREET4 1.12 02/14/2020   FREET4 1.17 09/07/2018   FREET4 0.41 (L) 06/03/2018   Lab Results  Component Value Date   T3FREE 3.1 11/05/2021   T3FREE 1.4 (L) 09/25/2020   T3FREE 2.7 11/28/2019   T3FREE 2.7 05/31/2019   T3FREE 2.6 10/21/2018   Pt denies: - feeling nodules in neck - hoarseness - dysphagia - choking  She has family history of thyroid disease in M and M uncles.  No Fh of thyroid cancer. + h/o radiation tx to head or neck - in 1991 (RAI for Graves). No herbal supplements. No recent steroids use.   She also has a history of DM2 - managed by PCP. -No complications  She is on: - Ozempic 0.5 mg weekly -tolerating well  Reviewed her HbA1c levels: Lab Results  Component Value Date   HGBA1C 6.6 (H) 11/05/2022   HGBA1C 6.6 (H) 07/07/2022   HGBA1C 6.5 (H) 02/19/2022   HGBA1C 7.5 (H) 11/05/2021   HGBA1C 7.8 (H) 08/26/2021   HGBA1C 5.9 (H) 05/31/2019   HGBA1C 6.3 (H) 09/07/2018   HGBA1C 6.3 (H) 06/03/2018   She checks sugars 1-2x a day: - am: n/c - 2h after b'fast: n/c - lunch: n/c - 2h after lunch: 101, 121 - dinner: 68 - 2h after dinner: 88, 130-155 - bedtime: 93  Eye exam: 02/17/2022 - No DR  Foot exam: ?  Lab Results  Component Value Date   MICRALBCREAT <15 02/19/2022   MICRALBCREAT <6 11/05/2021    ROS: + See HPI  I reviewed pt's medications, allergies, PMH, social hx, family hx, and changes were documented in the history of present illness. Otherwise, unchanged from my initial visit note.  Past Medical History:  Diagnosis Date   Anxiety    Blood transfusion without reported diagnosis    Depression    DM2 (diabetes mellitus, type 2) (HCC)    Hypercalcemia 04/03/2021   Hypertension    no medications at this time   Hypokalemia 04/03/2021   Hypothyroidism    Iron  deficiency anemia    Obesity    Palpitations    Subarachnoid hemorrhage (HCC)    Thyroid disease    Vitamin D deficiency    Past Surgical History:  Procedure Laterality Date   IR ANGIO INTRA EXTRACRAN SEL COM CAROTID INNOMINATE UNI L MOD SED  03/16/2021   IR ANGIO INTRA EXTRACRAN SEL COM CAROTID INNOMINATE UNI L MOD SED  03/26/2021   IR ANGIO INTRA EXTRACRAN SEL INTERNAL CAROTID UNI R MOD SED  03/15/2021   IR ANGIO INTRA EXTRACRAN SEL INTERNAL CAROTID UNI R MOD SED  03/26/2021   IR ANGIO INTRA EXTRACRAN SEL INTERNAL CAROTID UNI R MOD SED  03/25/2021   IR ANGIO INTRA EXTRACRAN SEL INTERNAL CAROTID UNI R MOD SED  10/29/2021   IR ANGIO VERTEBRAL SEL VERTEBRAL UNI L MOD SED  03/15/2021   IR ANGIO VERTEBRAL SEL VERTEBRAL UNI L MOD SED  03/26/2021   IR ANGIOGRAM FOLLOW UP STUDY  03/26/2021   IR ANGIOGRAM FOLLOW UP STUDY  03/26/2021   IR TRANSCATH/EMBOLIZ  03/26/2021   IR US GUIDE VASC ACCESS RIGHT  03/26/2021   IR US GUIDE VASC ACCESS RIGHT  10/29/2021   LAPAROSCOPIC VAGINAL HYSTERECTOMY WITH SALPINGECTOMY Bilateral 10/01/2016   Procedure: LAPAROSCOPIC ASSISTED VAGINAL HYSTERECTOMY WITH SALPINGECTOMY;  Surgeon: Huel Cote, MD;  Location: [facility];  Service:  Gynecology;  Laterality: Bilateral;   PARATHYROIDECTOMY Right 04/05/2021   Procedure: right inferioir PARATHYROIDECTOMY;  Surgeon: Darnell Level, MD;  Location: [facility];  Service: General;  Laterality: Right;   RADIOLOGY WITH ANESTHESIA N/A 03/15/2021   Procedure: IR WITH ANESTHESIA;  Surgeon: Lisbeth Renshaw, MD;  Location: [facility] OR;  Service: Radiology;  Laterality: N/A;   RADIOLOGY WITH ANESTHESIA N/A 03/26/2021   Procedure: IR WITH ANESTHESIA (Coiling and Embolization);  Surgeon: Lisbeth Renshaw, MD;  Location: [facility] OR;  Service: Radiology;  Laterality: N/A;   WISDOM TOOTH EXTRACTION     Social History   Socioeconomic History   Marital status: Single    Spouse name: Not on file   Number of children: 1   Years of education: Not on file    Highest education level: Bachelor's degree (e.g., BA, AB, BS)  Occupational History   Occupation: Biomedical engineer  Tobacco Use   Smoking status: Never   Smokeless tobacco: Never  Substance and Sexual Activity   Alcohol use: No   Drug use: No   Sexual activity: Yes    Birth control/protection: Condom  Other Topics Concern   Not on file  Social History Narrative   Not on file   Social Determinants of Health   Financial Resource Strain: Medium Risk (11/04/2022)   Overall Financial Resource Strain (CARDIA)    Difficulty of Paying Living Expenses: Somewhat hard  Food Insecurity: Food Insecurity Present (11/04/2022)   Hunger Vital Sign    Worried About Running Out of Food in the Last Year: Never true    Ran Out of Food in the Last Year: Sometimes true  Transportation Needs: No Transportation Needs (11/04/2022)   PRAPARE - Administrator, Civil Service (Medical): No    Lack of Transportation (Non-Medical): No  Physical Activity: Insufficiently Active (11/04/2022)   Exercise Vital Sign    Days of Exercise per Week: 3 days    Minutes of Exercise per Session: 20 min  Stress: Stress Concern Present (11/04/2022)   [employer] of Occupational Health - Occupational Stress Questionnaire    Feeling of Stress : Rather much  Social Connections: Moderately Integrated (11/04/2022)   Social Connection and Isolation Panel [NHANES]    Frequency of Communication with Friends and Family: More than three times a week    Frequency of Social Gatherings with Friends and Family: Once a week    Attends Religious Services: More than 4 times per year    Active Member of [employer] or Organizations: Yes    Attends Engineer, structural: More than 4 times per year    Marital Status: Never married  Intimate Partner Violence: Not on file   Current Outpatient Medications on File Prior to Visit  Medication Sig Dispense Refill   ALPRAZolam (XANAX XR) 1 MG 24 hr tablet Take 0.5 mg  by mouth daily as needed for sleep.     amLODipine-olmesartan (AZOR) 10-40 MG tablet TAKE 1 TABLET BY MOUTH DAILY 90 tablet 1   atorvastatin (LIPITOR) 20 MG tablet Take 1 tablet (20 mg total) by mouth daily. 90 tablet 3   azelastine (ASTELIN) 0.1 % nasal spray Place 2 sprays into both nostrils 2 (two) times daily. Use in each nostril as directed 30 mL 2   Blood Glucose Monitoring Suppl (ACCU-CHEK GUIDE ME) w/Device KIT USE AS DIRECTED FOUR TIMES DAILY 1 kit 1   escitalopram (LEXAPRO) 10 MG tablet Take 1 tablet (10 mg total) by mouth daily. 30 tablet 1  fexofenadine (ALLEGRA ALLERGY) 180 MG tablet Take 1 tablet (180 mg total) by mouth daily. 90 tablet 1   Glucagon (GVOKE HYPOPEN 2-PACK) 0.5 MG/0.1ML SOAJ Inject 0.1 mg into the skin as needed. 0.1 mL 6   glucose blood test strip Use as instructed 100 each 12   hydrochlorothiazide (HYDRODIURIL) 25 MG tablet Take 1 tablet (25 mg total) by mouth daily. 90 tablet 3   ipratropium (ATROVENT) 0.06 % nasal spray Place 2 sprays into the nose 3 (three) times daily. 15 mL 2   levothyroxine (SYNTHROID) 125 MCG tablet TAKE 1 TABLET(125 MCG) BY MOUTH DAILY 90 tablet 1   MAGNESIUM GLYCINATE PO Take 500 mg by mouth.     meclizine (ANTIVERT) 12.5 MG tablet Take 1 tablet (12.5 mg total) by mouth 3 (three) times daily as needed for dizziness. 30 tablet 1   metoprolol succinate (TOPROL-XL) 25 MG 24 hr tablet TAKE 1 TABLET(25 MG) BY MOUTH DAILY 90 tablet 0   OXcarbazepine (TRILEPTAL) 150 MG tablet Take 150 mg by mouth at bedtime.     polyethylene glycol (MIRALAX) 17 g packet Take 17 g by mouth daily. 90 packet 1   Semaglutide,0.25 or 0.5MG /DOS, 2 MG/3ML SOPN Inject 0.5 mg into the skin once a week. 3 mL 2   Vitamin D, Ergocalciferol, (DRISDOL) 1.25 MG (50000 UNIT) CAPS capsule TAKE 1 CAPSULE BY MOUTH EVERY 7 DAYS 12 capsule 1   No current facility-administered medications on file prior to visit.   Allergies  Allergen Reactions   Latex Rash   Penicillins Rash     Has patient had a PCN reaction causing immediate rash, facial/tongue/throat swelling, SOB or lightheadedness with hypotension: Yes Has patient had a PCN reaction causing severe rash involving mucus membranes or skin necrosis: No Has patient had a PCN reaction that required hospitalization No Has patient had a PCN reaction occurring within the last 10 years: No If all of the above answers are "NO", then may proceed with Cephalosporin use.    Family History  Problem Relation Age of Onset   Thyroid disease Mother    Hypertension Mother    Diabetes Father    Hypertension Other    Diabetes Other    Thyroid disease Other    Heart disease Other    PE: LMP 08/28/2016 (Exact Date) Comment: come back on 09/15/2016 Wt Readings from Last 3 Encounters:  01/13/23 200 lb (90.7 kg)  11/05/22 199 lb 9.6 oz (90.5 kg)  07/17/22 198 lb (89.8 kg)   Constitutional: overweight, in NAD. No kyphosis. Eyes: EOMI, + bilateral symmetric exophthalmos, no lid lag ENT: no thyromegaly, no cervical lymphadenopathy, + cervical scar healed with a little keloid on the left half of the scar Cardiovascular: RRR, No MRG Respiratory: CTA B Musculoskeletal: no deformities Skin:  no rashes Neurological: no tremor with outstretched hands  Assessment: 1. Hypercalcemia/hyperparathyroidism  2.  Acquired Hypothyroidism  3.  Vitamin D deficiency  4. DM2, non-insulin-dependent  Plan: Patient with a history of severe hypercalcemia and vitamin D deficiency, with previously undetectably high calcium  (!!).  She had a technetium sestamibi scan 04/04/2021 which showed a parathyroid adenoma of the right inferior gland and she had parathyroidectomy by Dr. Gerrit Friends on 04/06/2021.  Final pathology showed a parathyroid adenoma, not carcinoma.  After surgery, phosphorus was very low in the hospital, consistent with hungry bone syndrome, but then normalized.  -In the past, she had occasional tingling in her body and I advised her to  take some extra calcium in  those situations.  She was previously on Tums, but was forgetting doses.  She is not taking calcium anymore. -Latest calcium level was normal 11/05/2022, and 9.0 -We discussed about taking a calcium tablet as needed if she again develops tingling, but otherwise, no intervention is needed. -She has some keloid at the neck surgical scar site-I previously recommended kelo-cote strips >> she did not try this, but only used Mederma.  - Will check an ionized calcium today - I will see the patient back in 1 year  2.  Acquired hypothyroidism - latest thyroid labs reviewed with pt. >> normal: Lab Results  Component Value Date   TSH 2.94 01/15/2023  - she continues on LT4 125 mcg daily, last dose change 12/2021 - pt feels good on this dose. - we discussed about taking the thyroid hormone every day, with water, >30 minutes before breakfast, separated by >4 hours from acid reflux medications, calcium, iron, multivitamins. Pt. is taking it correctly.  3.  Vitamin D deficiency -She had a very low vitamin D level in the past, 12.4 -She was on ergocalciferol 50,000 units twice a week but then vitamin D level returned elevated, at 117, so we stopped this for 2 weeks and then restarted it once a week. -At last check, in 06/2022, vitamin D level was normal, at 44.8 -She continues on weekly ergocalciferol -refilled her prescription -We will recheck her vitamin D level today  4. DM2 -Appears to be well-controlled, on weekly GLP-1 receptor agonist, which she tolerates well -Sugars appear to be at goal per review of her meter downloads.  I did advise her to also check some sugars fasting and to rotate the check times throughout the day - advised to check sugars at different times of the day - 1x a day, rotating check times - advised for yearly eye exams >> she is UTD and has an appointment coming up - We checked a foot exam at today's visit - I would not suggest any changes in her  regimen for now - Will continue to follow-up with PCP   Carlus Pavlov, MD PhD [facility]

## 2023-02-25 NOTE — Patient Instructions (Signed)
Please continue: - Levothyroxine 125 mcg daily.  Take the thyroid hormone every day, with water, at least 30 minutes before breakfast, separated by at least 4 hours from: - acid reflux medications - calcium - iron - multivitamins  Continue Ergocalciferol 50,0000 units weekly.  Take 1 Tums tablet as needed.  Please come back in 1 year.

## 2023-02-26 ENCOUNTER — Encounter: Payer: Self-pay | Admitting: Nurse Practitioner

## 2023-02-26 ENCOUNTER — Ambulatory Visit: Payer: BC Managed Care – PPO | Admitting: Nurse Practitioner

## 2023-02-26 VITALS — BP 100/66 | HR 85 | Temp 98.5°F | Ht 67.0 in | Wt 200.0 lb

## 2023-02-26 DIAGNOSIS — I10 Essential (primary) hypertension: Secondary | ICD-10-CM

## 2023-02-26 DIAGNOSIS — K5904 Chronic idiopathic constipation: Secondary | ICD-10-CM

## 2023-02-26 DIAGNOSIS — E1169 Type 2 diabetes mellitus with other specified complication: Secondary | ICD-10-CM

## 2023-02-26 DIAGNOSIS — F418 Other specified anxiety disorders: Secondary | ICD-10-CM

## 2023-02-26 DIAGNOSIS — E039 Hypothyroidism, unspecified: Secondary | ICD-10-CM

## 2023-02-26 DIAGNOSIS — E782 Mixed hyperlipidemia: Secondary | ICD-10-CM | POA: Diagnosis not present

## 2023-02-26 DIAGNOSIS — Z2821 Immunization not carried out because of patient refusal: Secondary | ICD-10-CM

## 2023-02-26 DIAGNOSIS — E6609 Other obesity due to excess calories: Secondary | ICD-10-CM

## 2023-02-26 DIAGNOSIS — Z0000 Encounter for general adult medical examination without abnormal findings: Secondary | ICD-10-CM | POA: Diagnosis not present

## 2023-02-26 DIAGNOSIS — Z6831 Body mass index (BMI) 31.0-31.9, adult: Secondary | ICD-10-CM

## 2023-02-26 DIAGNOSIS — E669 Obesity, unspecified: Secondary | ICD-10-CM

## 2023-02-26 DIAGNOSIS — Z79899 Other long term (current) drug therapy: Secondary | ICD-10-CM

## 2023-02-26 DIAGNOSIS — Z23 Encounter for immunization: Secondary | ICD-10-CM | POA: Diagnosis not present

## 2023-02-26 DIAGNOSIS — E559 Vitamin D deficiency, unspecified: Secondary | ICD-10-CM

## 2023-02-26 DIAGNOSIS — G4733 Obstructive sleep apnea (adult) (pediatric): Secondary | ICD-10-CM | POA: Insufficient documentation

## 2023-02-26 DIAGNOSIS — E66811 Obesity, class 1: Secondary | ICD-10-CM

## 2023-02-26 DIAGNOSIS — Z9989 Dependence on other enabling machines and devices: Secondary | ICD-10-CM

## 2023-02-26 DIAGNOSIS — Z1231 Encounter for screening mammogram for malignant neoplasm of breast: Secondary | ICD-10-CM | POA: Insufficient documentation

## 2023-02-26 LAB — POCT URINALYSIS DIP (CLINITEK)
Bilirubin, UA: NEGATIVE
Blood, UA: NEGATIVE
Glucose, UA: NEGATIVE mg/dL
Ketones, POC UA: NEGATIVE mg/dL
Leukocytes, UA: NEGATIVE
Nitrite, UA: NEGATIVE
POC PROTEIN,UA: NEGATIVE
Spec Grav, UA: 1.01 (ref 1.010–1.025)
Urobilinogen, UA: 0.2 U/dL
pH, UA: 6.5 (ref 5.0–8.0)

## 2023-02-26 LAB — CALCIUM, IONIZED: Calcium, Ion: 4.9 mg/dL (ref 4.7–5.5)

## 2023-02-26 NOTE — Progress Notes (Signed)
Amanda Henry, CMA,acting as a Neurosurgeon for Amanda Felts, FNP.,have documented all relevant documentation on the behalf of Amanda Felts, FNP,as directed by  Amanda Felts, FNP while in the presence of Amanda Felts, FNP.  Subjective:    Patient ID: Amanda Henry , female    DOB: 1965/12/16 , 57 y.o.   MRN: [id number]  Chief Complaint  Patient presents with   Annual Exam    HPI  Patient presents today for HM, Patient reports compliance with medication. Patient denies any chest pain, SOB, or headaches. Patient report she is having stomach issues, she reports sometimes constipated and sometimes diarrhea  Patient has mammogram and eye exam scheduled (in December 2024). She continues to take alprazolam and is trying to wean off. She is seeing Dr. Evelene Croon. She does not feel like she is getting enough rest. She is no longer working.  She is now on CPAP for mild sleep apnea and is due to f/u in 30 days.   She has been having constipation and treating with miralax, stool softner. She eats increase amount of fiber. Sometimes when she eats salad she will have bloating. She is not taking a probiotic.   BP Readings from Last 3 Encounters: 02/26/23 : 100/66 02/25/23 : 120/70 01/13/23 : 110/60        Past Medical History:  Diagnosis Date   Allergy    Anxiety    Arthritis    Blood transfusion without reported diagnosis    Depression    DM2 (diabetes mellitus, type 2) (HCC)    Hypercalcemia 04/03/2021   Hypertension    no medications at this time   Hypokalemia 04/03/2021   Hypothyroidism    Iron deficiency anemia    Obesity    Palpitations    Subarachnoid hemorrhage (HCC)    Thyroid disease    Vitamin D deficiency      Family History  Problem Relation Age of Onset   Thyroid disease Mother    Hypertension Mother    Diabetes Father    Hearing loss Father    Hypertension Other    Diabetes Other    Thyroid disease Other    Heart disease Other    ADD / ADHD Son      Current  Outpatient Medications:    ALPRAZolam (XANAX XR) 1 MG 24 hr tablet, Take 0.5 mg by mouth daily as needed for sleep., Disp: , Rfl:    amLODipine-olmesartan (AZOR) 10-40 MG tablet, TAKE 1 TABLET BY MOUTH DAILY, Disp: 90 tablet, Rfl: 1   atorvastatin (LIPITOR) 20 MG tablet, Take 1 tablet (20 mg total) by mouth daily., Disp: 90 tablet, Rfl: 3   azelastine (ASTELIN) 0.1 % nasal spray, Place 2 sprays into both nostrils 2 (two) times daily. Use in each nostril as directed, Disp: 30 mL, Rfl: 2   Blood Glucose Monitoring Suppl (ACCU-CHEK GUIDE ME) w/Device KIT, USE AS DIRECTED FOUR TIMES DAILY, Disp: 1 kit, Rfl: 1   escitalopram (LEXAPRO) 10 MG tablet, Take 1 tablet (10 mg total) by mouth daily., Disp: 30 tablet, Rfl: 1   fexofenadine (ALLEGRA ALLERGY) 180 MG tablet, Take 1 tablet (180 mg total) by mouth daily., Disp: 90 tablet, Rfl: 1   Glucagon (GVOKE HYPOPEN 2-PACK) 0.5 MG/0.1ML SOAJ, Inject 0.1 mg into the skin as needed., Disp: 0.1 mL, Rfl: 6   glucose blood test strip, Use as instructed, Disp: 100 each, Rfl: 12   hydrochlorothiazide (HYDRODIURIL) 25 MG tablet, Take 1 tablet (25 mg total) by mouth daily., Disp:  90 tablet, Rfl: 3   levothyroxine (SYNTHROID) 125 MCG tablet, TAKE 1 TABLET(125 MCG) BY MOUTH DAILY, Disp: 90 tablet, Rfl: 3   MAGNESIUM GLYCINATE PO, Take 500 mg by mouth., Disp: , Rfl:    meclizine (ANTIVERT) 12.5 MG tablet, Take 1 tablet (12.5 mg total) by mouth 3 (three) times daily as needed for dizziness., Disp: 30 tablet, Rfl: 1   metoprolol succinate (TOPROL-XL) 25 MG 24 hr tablet, TAKE 1 TABLET(25 MG) BY MOUTH DAILY, Disp: 90 tablet, Rfl: 0   OXcarbazepine (TRILEPTAL) 150 MG tablet, Take 150 mg by mouth at bedtime., Disp: , Rfl:    polyethylene glycol (MIRALAX) 17 g packet, Take 17 g by mouth daily., Disp: 90 packet, Rfl: 1   Semaglutide,0.25 or 0.5MG /DOS, 2 MG/3ML SOPN, Inject 0.5 mg into the skin once a week., Disp: 3 mL, Rfl: 2   Vitamin D, Ergocalciferol, (DRISDOL) 1.25 MG (50000  UNIT) CAPS capsule, Take 1 capsule (50,000 Units total) by mouth every 7 (seven) days., Disp: 15 capsule, Rfl: 3   ipratropium (ATROVENT) 0.06 % nasal spray, Place 2 sprays into the nose 3 (three) times daily., Disp: 15 mL, Rfl: 2   Allergies  Allergen Reactions   Latex Rash   Penicillins Rash    Has patient had a PCN reaction causing immediate rash, facial/tongue/throat swelling, SOB or lightheadedness with hypotension: Yes Has patient had a PCN reaction causing severe rash involving mucus membranes or skin necrosis: No Has patient had a PCN reaction that required hospitalization No Has patient had a PCN reaction occurring within the last 10 years: No If all of the above answers are "NO", then may proceed with Cephalosporin use.       The patient states she is status post hysterectomy. Patient's last menstrual period was 08/28/2016 (exact date)... Negative for: breast discharge, breast lump(s), breast pain and breast self exam. Associated symptoms include abnormal vaginal bleeding. Pertinent negatives include abnormal bleeding (hematology), anxiety, decreased libido, depression, difficulty falling sleep, dyspareunia, history of infertility, nocturia, sexual dysfunction, sleep disturbances, urinary incontinence, urinary urgency, vaginal discharge and vaginal itching. Diet regular.  The patient states her exercise level is walking 2-3 days a week 30 minutes.   The patient's tobacco use is:  Social History   Tobacco Use  Smoking Status Never  Smokeless Tobacco Never  . She has been exposed to passive smoke. The patient's alcohol use is:  Social History   Substance and Sexual Activity  Alcohol Use No    Review of Systems  Constitutional: Negative.   HENT: Negative.    Eyes: Negative.   Respiratory: Negative.    Cardiovascular: Negative.   Gastrointestinal:  Positive for constipation.  Endocrine: Negative.   Genitourinary: Negative.   Musculoskeletal: Negative.   Skin: Negative.    Allergic/Immunologic: Negative.   Neurological: Negative.   Hematological: Negative.   Psychiatric/Behavioral: Negative.       Today's Vitals   02/26/23 1527  BP: 100/66  Pulse: 85  Temp: 98.5 F (36.9 C)  TempSrc: Oral  Weight: 200 lb (90.7 kg)  Height: 5\' 7"  (1.702 m)  PainSc: 0-No pain   Body mass index is 31.32 kg/m.  Wt Readings from Last 3 Encounters:  02/26/23 200 lb (90.7 kg)  02/25/23 200 lb 9.6 oz (91 kg)  01/13/23 200 lb (90.7 kg)       02/26/2023    3:34 PM 02/19/2022    3:10 PM 12/26/2020   11:06 AM 09/25/2020    4:16 PM 06/20/2020  4:04 PM  Depression screen PHQ 2/9  Decreased Interest 2 1 0 0 0  Down, Depressed, Hopeless 2 1 2  0 0  PHQ - 2 Score 4 2 2  0 0  Altered sleeping 3 1 3 1 2   Tired, decreased energy 2 1 3  0 0  Change in appetite 0 1 3 0 1  Feeling bad or failure about yourself  1 1 0 0 0  Trouble concentrating 2 1 3 1 3   Moving slowly or fidgety/restless 1 0 3 0 2  Suicidal thoughts 0 0 0 0 0  PHQ-9 Score 13 7 17 2 8   Difficult doing work/chores Very difficult Somewhat difficult Somewhat difficult Somewhat difficult Very difficult      02/26/2023    3:34 PM  GAD 7 : Generalized Anxiety Score  Nervous, Anxious, on Edge 1  Control/stop worrying 1  Worry too much - different things 1  Trouble relaxing 1  Restless 0  Easily annoyed or irritable 1  Afraid - awful might happen 1  Total GAD 7 Score 6  Anxiety Difficulty Somewhat difficult      Objective:  Physical Exam Vitals reviewed.  Constitutional:      General: She is not in acute distress.    Appearance: Normal appearance. She is well-developed. She is obese.  HENT:     Head: Normocephalic and atraumatic.     Right Ear: Hearing, tympanic membrane, ear canal and external ear normal.     Left Ear: Hearing, tympanic membrane, ear canal and external ear normal.     Nose: Nose normal.     Mouth/Throat:     Mouth: Mucous membranes are moist.  Eyes:     General: Lids are  normal.     Conjunctiva/sclera: Conjunctivae normal.     Pupils: Pupils are equal, round, and reactive to light.     Funduscopic exam:    Right eye: No papilledema.        Left eye: No papilledema.  Neck:     Thyroid: No thyroid mass.     Vascular: No carotid bruit.  Cardiovascular:     Rate and Rhythm: Normal rate and regular rhythm.     Pulses: Normal pulses.     Heart sounds: Normal heart sounds. No murmur heard. Pulmonary:     Effort: Pulmonary effort is normal. No respiratory distress.     Breath sounds: Normal breath sounds. No wheezing.  Abdominal:     General: Abdomen is flat. Bowel sounds are normal.     Palpations: Abdomen is soft.  Musculoskeletal:        General: No swelling or tenderness. Normal range of motion.     Cervical back: Full passive range of motion without pain, normal range of motion and neck supple.     Right lower leg: No edema.     Left lower leg: No edema.  Skin:    General: Skin is warm and dry.     Capillary Refill: Capillary refill takes less than 2 seconds.  Neurological:     General: No focal deficit present.     Mental Status: She is alert and oriented to person, place, and time.     Cranial Nerves: No cranial nerve deficit.     Sensory: No sensory deficit.  Psychiatric:        Mood and Affect: Mood normal.        Behavior: Behavior normal.        Thought Content: Thought content normal.  Judgment: Judgment normal.         Assessment And Plan:     Encounter for annual health examination Assessment & Plan: Behavior modifications discussed and diet history reviewed.   Pt will continue to exercise regularly and modify diet with low GI, plant based foods and decrease intake of processed foods.  Recommend intake of daily multivitamin, Vitamin D, and calcium.  Recommend mammogram and colonoscopy for preventive screenings, as well as recommend immunizations that include influenza, TDAP, and Shingles    Encounter for screening  mammogram for breast cancer Assessment & Plan: Pt instructed on Self Breast Exam.According to ACOG guidelines Women aged 33 and older are recommended to get an annual mammogram. Order placed.    Orders: -     Digital Screening Mammogram, Left and Right; Future  Class 1 obesity due to excess calories with serious comorbidity and body mass index (BMI) of 31.0 to 31.9 in adult Assessment & Plan: She is encouraged to strive for BMI less than 30 to decrease cardiac risk. Advised to aim for at least 150 minutes of exercise per week.    Need for influenza vaccination Assessment & Plan: Influenza vaccine administered Encouraged to take Tylenol as needed for fever or muscle aches.   Orders: -     Flu vaccine trivalent PF, 6mos and older(Flulaval,Afluria,Fluarix,Fluzone)  COVID-19 vaccination declined Assessment & Plan: Declines covid 19 vaccine. Discussed risk of covid 68 and if she changes her mind about the vaccine to call the office. Education has been provided regarding the importance of this vaccine but patient still declined. Advised may receive this vaccine at local pharmacy or Health Dept.or vaccine clinic. Aware to provide a copy of the vaccination record if obtained from local pharmacy or Health Dept.  Encouraged to take multivitamin, vitamin d, vitamin c and zinc to increase immune system. Aware can call office if would like to have vaccine here at office. Verbalized acceptance and understanding.    Type 2 diabetes mellitus with obesity (HCC) Assessment & Plan: HgbA1c was 6.6 at last visit continue Ozempic she has decreased the dose due to her having symptoms. Will recheck today to see if need additional changes.   Orders: -     EKG 12-Lead -     POCT URINALYSIS DIP (CLINITEK) -     Microalbumin / creatinine urine ratio -     CMP14+EGFR -     Hemoglobin A1c  Mixed hyperlipidemia Assessment & Plan: Will check cholesterol, continue statin, tolerating well.   Orders: -      CMP14+EGFR -     Lipid panel  Vitamin D deficiency Assessment & Plan: Will check vitamin D level and supplement as needed.    Also encouraged to spend 15 minutes in the sun daily.     Essential hypertension Assessment & Plan: Blood pressure is well controlled, continue f/u with cardiology  Orders: -     EKG 12-Lead -     POCT URINALYSIS DIP (CLINITEK) -     Microalbumin / creatinine urine ratio  Acquired hypothyroidism Assessment & Plan: Continue levothyroxine, will check levels. Last visit to Endocrinology was Feb 2024   Depression with anxiety Assessment & Plan: Depression score is 13 and Anxiety level is 6 continue f/u with psychiatry, they are still in process of trying to wean off ativan   Moderate obstructive sleep apnea Assessment & Plan: She is now using a CPAP for mild sleep apnea and will f/u in 30 days.    Uses continuous  positive airway pressure (CPAP) ventilation at home  Other long term (current) drug therapy -     CBC with Differential/Platelet  Chronic idiopathic constipation Assessment & Plan: She is doing better with her constipation. She is encouraged to continue with staying well hydrated       Return for 1 year physical, controlled DM check 4 months. Patient was given opportunity to ask questions. Patient verbalized understanding of the plan and was able to repeat key elements of the plan. All questions were answered to their satisfaction.   Amanda Felts, FNP  I, Amanda Felts, FNP, have reviewed all documentation for this visit. The documentation on 02/26/23 for the exam, diagnosis, procedures, and orders are all accurate and complete.

## 2023-02-27 ENCOUNTER — Encounter: Payer: Self-pay | Admitting: Nurse Practitioner

## 2023-02-27 LAB — CBC WITH DIFFERENTIAL/PLATELET
Basophils Absolute: 0 10*3/uL (ref 0.0–0.2)
Basos: 1 %
EOS (ABSOLUTE): 0.1 10*3/uL (ref 0.0–0.4)
Eos: 2 %
Hematocrit: 39.6 % (ref 34.0–46.6)
Hemoglobin: 13.1 g/dL (ref 11.1–15.9)
Immature Grans (Abs): 0 10*3/uL (ref 0.0–0.1)
Immature Granulocytes: 0 %
Lymphocytes Absolute: 2.7 10*3/uL (ref 0.7–3.1)
Lymphs: 43 %
MCH: 28.1 pg (ref 26.6–33.0)
MCHC: 33.1 g/dL (ref 31.5–35.7)
MCV: 85 fL (ref 79–97)
Monocytes Absolute: 0.5 10*3/uL (ref 0.1–0.9)
Monocytes: 7 %
Neutrophils Absolute: 2.9 10*3/uL (ref 1.4–7.0)
Neutrophils: 47 %
Platelets: 296 10*3/uL (ref 150–450)
RBC: 4.67 x10E6/uL (ref 3.77–5.28)
RDW: 14.2 % (ref 11.7–15.4)
WBC: 6.2 10*3/uL (ref 3.4–10.8)

## 2023-02-27 LAB — CMP14+EGFR
ALT: 17 [IU]/L (ref 0–32)
AST: 23 [IU]/L (ref 0–40)
Albumin: 4.3 g/dL (ref 3.8–4.9)
Alkaline Phosphatase: 101 [IU]/L (ref 44–121)
BUN/Creatinine Ratio: 13 (ref 9–23)
BUN: 13 mg/dL (ref 6–24)
Bilirubin Total: 0.4 mg/dL (ref 0.0–1.2)
CO2: 25 mmol/L (ref 20–29)
Calcium: 9 mg/dL (ref 8.7–10.2)
Chloride: 96 mmol/L (ref 96–106)
Creatinine, Ser: 0.99 mg/dL (ref 0.57–1.00)
Globulin, Total: 3.3 g/dL (ref 1.5–4.5)
Glucose: 95 mg/dL (ref 70–99)
Potassium: 3.3 mmol/L — ABNORMAL LOW (ref 3.5–5.2)
Sodium: 139 mmol/L (ref 134–144)
Total Protein: 7.6 g/dL (ref 6.0–8.5)
eGFR: 67 mL/min/{1.73_m2} (ref >59)

## 2023-02-27 LAB — MICROALBUMIN / CREATININE URINE RATIO
Creatinine, Urine: 69.4 mg/dL
Microalb/Creat Ratio: 5 mg/g{creat} (ref 0–29)
Microalbumin, Urine: 3.5 ug/mL

## 2023-02-27 LAB — HEMOGLOBIN A1C
Est. average glucose Bld gHb Est-mCnc: 166 mg/dL
Hgb A1c MFr Bld: 7.4 % — ABNORMAL HIGH (ref 4.8–5.6)

## 2023-02-27 LAB — LIPID PANEL
Chol/HDL Ratio: 3.1 {ratio} (ref 0.0–4.4)
Cholesterol, Total: 163 mg/dL (ref 100–199)
HDL: 53 mg/dL (ref >39)
LDL Chol Calc (NIH): 94 mg/dL (ref 0–99)
Triglycerides: 83 mg/dL (ref 0–149)
VLDL Cholesterol Cal: 16 mg/dL (ref 5–40)

## 2023-03-04 ENCOUNTER — Other Ambulatory Visit: Payer: Self-pay | Admitting: Nurse Practitioner

## 2023-03-04 DIAGNOSIS — Z1231 Encounter for screening mammogram for malignant neoplasm of breast: Secondary | ICD-10-CM

## 2023-03-05 ENCOUNTER — Ambulatory Visit
Admission: RE | Admit: 2023-03-05 | Discharge: 2023-03-05 | Disposition: A | Discharge status: Home / Self Care | Payer: BC Managed Care – PPO | Source: Ambulatory Visit

## 2023-03-05 DIAGNOSIS — Z1231 Encounter for screening mammogram for malignant neoplasm of breast: Secondary | ICD-10-CM

## 2023-03-08 NOTE — Assessment & Plan Note (Signed)
Influenza vaccine administered Encouraged to take Tylenol as needed for fever or muscle aches.

## 2023-03-08 NOTE — Assessment & Plan Note (Signed)
HgbA1c was 6.6 at last visit continue Ozempic she has decreased the dose due to her having symptoms. Will recheck today to see if need additional changes.

## 2023-03-08 NOTE — Assessment & Plan Note (Signed)
Pt instructed on Self Breast Exam.According to ACOG guidelines Women aged 55 and older are recommended to get an annual mammogram. Order placed

## 2023-03-08 NOTE — Assessment & Plan Note (Signed)

## 2023-03-08 NOTE — Assessment & Plan Note (Signed)
She is encouraged to strive for BMI less than 30 to decrease cardiac risk. Advised to aim for at least 150 minutes of exercise per week.

## 2023-03-08 NOTE — Assessment & Plan Note (Signed)
Behavior modifications discussed and diet history reviewed.   Pt will continue to exercise regularly and modify diet with low GI, plant based foods and decrease intake of processed foods.  Recommend intake of daily multivitamin, Vitamin D, and calcium.  Recommend mammogram and colonoscopy for preventive screenings, as well as recommend immunizations that include influenza, TDAP, and Shingles  

## 2023-03-08 NOTE — Assessment & Plan Note (Signed)
She is doing better with her constipation. She is encouraged to continue with staying well hydrated

## 2023-03-08 NOTE — Assessment & Plan Note (Signed)
Blood pressure is well controlled, continue f/u with cardiology

## 2023-03-08 NOTE — Assessment & Plan Note (Signed)
Continue levothyroxine, will check levels. Last visit to Endocrinology was Feb 2024

## 2023-03-08 NOTE — Assessment & Plan Note (Signed)
Depression score is 13 and Anxiety level is 6 continue f/u with psychiatry, they are still in process of trying to wean off ativan

## 2023-03-08 NOTE — Assessment & Plan Note (Signed)
She is now using a CPAP for mild sleep apnea and will f/u in 30 days.

## 2023-03-08 NOTE — Assessment & Plan Note (Signed)
Will check vitamin D level and supplement as needed.    Also encouraged to spend 15 minutes in the sun daily.   

## 2023-03-08 NOTE — Assessment & Plan Note (Signed)
Will check cholesterol, continue statin, tolerating well.

## 2023-03-10 ENCOUNTER — Encounter: Payer: Self-pay | Admitting: Nurse Practitioner

## 2023-03-10 ENCOUNTER — Encounter (HOSPITAL_BASED_OUTPATIENT_CLINIC_OR_DEPARTMENT_OTHER): Payer: Self-pay

## 2023-03-10 NOTE — Telephone Encounter (Signed)
Patient recently put back on Atorvastatin but having side effects, would you like to recommend a different medication?

## 2023-03-18 NOTE — Progress Notes (Unsigned)
SLEEP MEDICINE VIRTUAL VISIT      Virtual Visit via Video Note   Because of Turkey Craney's co-morbid illnesses, she is at least at moderate risk for complications without adequate follow up.  This format is felt to be most appropriate for this patient at this time.  All issues noted in this document were discussed and addressed.  A limited physical exam was performed with this format.  Please refer to the patient's chart for her consent to telehealth for [facility].       Date:  03/19/2023   ID:  Amanda Henry, DOB 03/22/1966, MRN [id number] The patient was identified using 2 identifiers.  Patient Location: Home Provider Location: Home Office   PCP:  [name], FNP   [facility] Providers Cardiologist:  None     Evaluation Performed:  Follow-Up Visit  Chief Complaint:  OSA  History of Present Illness:    Amanda Henry is a 57 y.o. female with a history of type 2 diabetes mellitus, hypertension and depression who recently had a home sleep study ordered by Gillian Shields, NP due to ongoing snoring and excessive daytime sleepiness with a STOP-BANG score of 5.    Home sleep study showed mild obstructive sleep apnea with an AHI of 7.7/h, no central events and no nocturnal hypoxemia.  She had moderate OSA during REM sleep at 22/hr. At her last OV she opted for a trial of CPAP therapy and she was started on auto CPAP from 4 to 12cm H2O with heated humidity and is now here for followup.  She is doing well with her PAP device and thinks that she has gotten used to it.  She tolerates the mask and feels the pressure is adequate.  Since going on PAP she feels rested in the am but does get sleepy some during the day.  She denies any significant mouth or nasal dryness or nasal congestion.  She does not think that he snores.     Past Medical History:  Diagnosis Date   Allergy    Anxiety    Arthritis    Blood transfusion without reported diagnosis    Depression     DM2 (diabetes mellitus, type 2) (HCC)    Hypercalcemia 04/03/2021   Hypertension    no medications at this time   Hypokalemia 04/03/2021   Hypothyroidism    Iron deficiency anemia    Obesity    Palpitations    Subarachnoid hemorrhage (HCC)    Thyroid disease    Vitamin D deficiency    Past Surgical History:  Procedure Laterality Date   IR ANGIO INTRA EXTRACRAN SEL COM CAROTID INNOMINATE UNI L MOD SED  03/16/2021   IR ANGIO INTRA EXTRACRAN SEL COM CAROTID INNOMINATE UNI L MOD SED  03/26/2021   IR ANGIO INTRA EXTRACRAN SEL INTERNAL CAROTID UNI R MOD SED  03/15/2021   IR ANGIO INTRA EXTRACRAN SEL INTERNAL CAROTID UNI R MOD SED  03/26/2021   IR ANGIO INTRA EXTRACRAN SEL INTERNAL CAROTID UNI R MOD SED  03/25/2021   IR ANGIO INTRA EXTRACRAN SEL INTERNAL CAROTID UNI R MOD SED  10/29/2021   IR ANGIO VERTEBRAL SEL VERTEBRAL UNI L MOD SED  03/15/2021   IR ANGIO VERTEBRAL SEL VERTEBRAL UNI L MOD SED  03/26/2021   IR ANGIOGRAM FOLLOW UP STUDY  03/26/2021   IR ANGIOGRAM FOLLOW UP STUDY  03/26/2021   IR TRANSCATH/EMBOLIZ  03/26/2021   IR US GUIDE VASC ACCESS RIGHT  03/26/2021   IR US GUIDE  VASC ACCESS RIGHT  10/29/2021   LAPAROSCOPIC VAGINAL HYSTERECTOMY WITH SALPINGECTOMY Bilateral 10/01/2016   Procedure: LAPAROSCOPIC ASSISTED VAGINAL HYSTERECTOMY WITH SALPINGECTOMY;  Surgeon: Huel Cote, MD;  Location: [facility];  Service: Gynecology;  Laterality: Bilateral;   PARATHYROIDECTOMY Right 04/05/2021   Procedure: right inferioir PARATHYROIDECTOMY;  Surgeon: Darnell Level, MD;  Location: [facility];  Service: General;  Laterality: Right;   RADIOLOGY WITH ANESTHESIA N/A 03/15/2021   Procedure: IR WITH ANESTHESIA;  Surgeon: Lisbeth Renshaw, MD;  Location[facility];  Service: Radiology;  Laterality: N/A;   RADIOLOGY WITH ANESTHESIA N/A 03/26/2021   Procedure: IR WITH ANESTHESIA (Coiling and Embolization);  Surgeon: Lisbeth Renshaw, MD;  Location: [facility] OR;  Service: Radiology;  Laterality: N/A;   WISDOM TOOTH  EXTRACTION       Current Meds  Medication Sig   ALPRAZolam (XANAX XR) 1 MG 24 hr tablet Take 0.5 mg by mouth daily as needed for sleep.   amLODipine-olmesartan (AZOR) 10-40 MG tablet TAKE 1 TABLET BY MOUTH DAILY   escitalopram (LEXAPRO) 10 MG tablet Take 1 tablet (10 mg total) by mouth daily.   Glucagon (GVOKE HYPOPEN 2-PACK) 0.5 MG/0.1ML SOAJ Inject 0.1 mg into the skin as needed.   glucose blood test strip Use as instructed   hydrochlorothiazide (HYDRODIURIL) 25 MG tablet Take 1 tablet (25 mg total) by mouth daily.   levothyroxine (SYNTHROID) 125 MCG tablet TAKE 1 TABLET(125 MCG) BY MOUTH DAILY   MAGNESIUM GLYCINATE PO Take 500 mg by mouth.   meclizine (ANTIVERT) 12.5 MG tablet Take 1 tablet (12.5 mg total) by mouth 3 (three) times daily as needed for dizziness.   metoprolol succinate (TOPROL-XL) 25 MG 24 hr tablet TAKE 1 TABLET(25 MG) BY MOUTH DAILY   OXcarbazepine (TRILEPTAL) 150 MG tablet Take 150 mg by mouth at bedtime.   polyethylene glycol (MIRALAX) 17 g packet Take 17 g by mouth daily.   Semaglutide,0.25 or 0.5MG /DOS, 2 MG/3ML SOPN Inject 0.5 mg into the skin once a week.   Vitamin D, Ergocalciferol, (DRISDOL) 1.25 MG (50000 UNIT) CAPS capsule Take 1 capsule (50,000 Units total) by mouth every 7 (seven) days.     Allergies:   Latex and Penicillins   Social History   Tobacco Use   Smoking status: Never   Smokeless tobacco: Never  Substance Use Topics   Alcohol use: No   Drug use: No     Family Hx: The patient's family history includes ADD / ADHD in her son; Diabetes in her father and another family member; Hearing loss in her father; Heart disease in an other family member; Hypertension in her mother and another family member; Thyroid disease in her mother and another family member.  ROS:   Please see the history of present illness.     All other systems reviewed and are negative.   Prior Sleep studies:   The following studies were reviewed today:  PAP compliance  download  Labs/Other Tests and Data Reviewed:     Recent Labs: 07/03/2022: Magnesium 2.4 01/15/2023: TSH 2.94 02/26/2023: ALT 17; BUN 13; Creatinine, Ser 0.99; Hemoglobin 13.1; Platelets 296; Potassium 3.3; Sodium 139   Wt Readings from Last 3 Encounters:  03/19/23 200 lb (90.7 kg)  02/26/23 200 lb (90.7 kg)  02/25/23 200 lb 9.6 oz (91 kg)     Risk Assessment/Calculations:      Objective:    Vital Signs:  BP (!) 92/59   Pulse 64   Ht 5\' 7"  (1.702 m)   Wt 200 lb (90.7  kg)   LMP 08/28/2016 (Exact Date) Comment: come back on 09/15/2016  BMI 31.32 kg/m    VITAL SIGNS:  reviewed GEN:  no acute distress EYES:  sclerae anicteric, EOMI - Extraocular Movements Intact RESPIRATORY:  normal respiratory effort, symmetric expansion CARDIOVASCULAR:  no peripheral edema SKIN:  no rash, lesions or ulcers. MUSCULOSKELETAL:  no obvious deformities. NEURO:  alert and oriented x 3, no obvious focal deficit PSYCH:  normal affect  ASSESSMENT & PLAN:    OSA - The patient is tolerating PAP therapy well without any problems. The PAP download performed by his DME was personally reviewed and interpreted by me today and showed an AHI of 0.7/hr on auto CPAP from 4 to 12 cm H2O with 40% compliance in using more than 4 hours nightly.  The patient has been using and benefiting from PAP use and will continue to benefit from therapy.  -I encouraged her to try to use her device > 5 hours nightly which should help with her daytime sleepiness  HTN -BP controlled at home but on the low side today>>she says that it has been running low at times -encouraged her to talk with her PCP about her BP meds -continue prescription drug management with Amlodipine-olmesartan 10-40mg  daily, hydrochlorothiazide 25mg  daily, Toprol XL 25mg  daily with PRN refills  Insomnia -this is both sleep onset and sleep maintenance insomnia -her PCP is treating this and is on Alprazolam but is not helping -I encouraged her to talk with  her PCP about possibly trying melatonin    Total time of encounter: 20 minutes total time of encounter, including 1 minutes spent in face-to-face patient care on the date of this encounter. This time includes coordination of care and counseling regarding above mentioned problem list. Remainder of non-face-to-face time involved reviewing chart documents/testing relevant to the patient encounter and documentation in the medical record. I have independently reviewed documentation from referring provider.    Medication Adjustments/Labs and Tests Ordered: Current medicines are reviewed at length with the patient today.  Concerns regarding medicines are outlined above.   Tests Ordered: No orders of the defined types were placed in this encounter.   Medication Changes: No orders of the defined types were placed in this encounter.   Follow Up:  In Person in 1 year(s)  Signed, Armanda Magic, MD  03/19/2023 9:37 AM    [facility]

## 2023-03-19 ENCOUNTER — Encounter: Payer: Self-pay | Admitting: Cardiology

## 2023-03-19 ENCOUNTER — Ambulatory Visit: Payer: BC Managed Care – PPO | Attending: Cardiology | Admitting: Cardiology

## 2023-03-19 ENCOUNTER — Encounter: Payer: Self-pay | Admitting: Nurse Practitioner

## 2023-03-19 VITALS — BP 92/59 | HR 64 | Ht 67.0 in | Wt 200.0 lb

## 2023-03-19 DIAGNOSIS — G4700 Insomnia, unspecified: Secondary | ICD-10-CM

## 2023-03-19 DIAGNOSIS — I10 Essential (primary) hypertension: Secondary | ICD-10-CM | POA: Diagnosis not present

## 2023-03-19 DIAGNOSIS — G4733 Obstructive sleep apnea (adult) (pediatric): Secondary | ICD-10-CM | POA: Diagnosis not present

## 2023-03-19 NOTE — Patient Instructions (Signed)

## 2023-03-24 ENCOUNTER — Ambulatory Visit: Payer: BC Managed Care – PPO

## 2023-03-24 NOTE — Progress Notes (Deleted)
Patient presents today for her covid shot. Patient waited allotted time after covid shot.

## 2023-03-25 ENCOUNTER — Encounter: Payer: Self-pay | Admitting: Nurse Practitioner

## 2023-03-26 ENCOUNTER — Encounter: Payer: Self-pay | Admitting: Nurse Practitioner

## 2023-03-29 ENCOUNTER — Other Ambulatory Visit (HOSPITAL_BASED_OUTPATIENT_CLINIC_OR_DEPARTMENT_OTHER): Payer: Self-pay | Admitting: Family

## 2023-03-29 DIAGNOSIS — E119 Type 2 diabetes mellitus without complications: Secondary | ICD-10-CM

## 2023-03-29 DIAGNOSIS — E782 Mixed hyperlipidemia: Secondary | ICD-10-CM

## 2023-04-02 ENCOUNTER — Encounter: Payer: Self-pay | Admitting: Nurse Practitioner

## 2023-04-02 NOTE — Telephone Encounter (Signed)
Care team updated and letter sent for eye exam notes.

## 2023-04-05 ENCOUNTER — Encounter: Payer: Self-pay | Admitting: Nurse Practitioner

## 2023-04-07 ENCOUNTER — Encounter (HOSPITAL_BASED_OUTPATIENT_CLINIC_OR_DEPARTMENT_OTHER): Payer: Self-pay | Admitting: Cardiovascular Disease

## 2023-04-09 ENCOUNTER — Encounter: Payer: Self-pay | Admitting: Cardiology

## 2023-04-09 DIAGNOSIS — G4733 Obstructive sleep apnea (adult) (pediatric): Secondary | ICD-10-CM

## 2023-04-09 DIAGNOSIS — R0683 Snoring: Secondary | ICD-10-CM

## 2023-04-09 DIAGNOSIS — R002 Palpitations: Secondary | ICD-10-CM

## 2023-04-09 DIAGNOSIS — I10 Essential (primary) hypertension: Secondary | ICD-10-CM

## 2023-04-14 ENCOUNTER — Ambulatory Visit: Payer: BC Managed Care – PPO

## 2023-04-14 VITALS — BP 110/70 | Temp 98.9°F | Ht 67.0 in | Wt 200.0 lb

## 2023-04-14 DIAGNOSIS — Z23 Encounter for immunization: Secondary | ICD-10-CM

## 2023-04-14 NOTE — Progress Notes (Signed)
Patient presents today for a covid vaccine. Patient waited 15 minutes she tolerated vaccine well. YL,RMA

## 2023-04-27 MED ORDER — METOPROLOL TARTRATE 25 MG PO TABS: 12.5000 mg | ORAL_TABLET | Freq: Two times a day (BID) | ORAL | 1 refills | Status: AC | PRN

## 2023-04-30 ENCOUNTER — Other Ambulatory Visit: Payer: Self-pay

## 2023-04-30 ENCOUNTER — Encounter: Payer: Self-pay | Admitting: Nurse Practitioner

## 2023-04-30 DIAGNOSIS — E1169 Type 2 diabetes mellitus with other specified complication: Secondary | ICD-10-CM

## 2023-04-30 MED ORDER — SEMAGLUTIDE(0.25 OR 0.5MG/DOS) 2 MG/3ML ~~LOC~~ SOPN: 0.5000 mg | PEN_INJECTOR | SUBCUTANEOUS | 2 refills | Status: DC

## 2023-05-04 ENCOUNTER — Encounter: Payer: Self-pay | Admitting: Cardiology

## 2023-05-18 ENCOUNTER — Encounter: Payer: Self-pay | Admitting: Family

## 2023-05-25 ENCOUNTER — Encounter: Payer: Self-pay | Admitting: Cardiology

## 2023-05-25 DIAGNOSIS — Z79899 Other long term (current) drug therapy: Secondary | ICD-10-CM

## 2023-05-25 DIAGNOSIS — E782 Mixed hyperlipidemia: Secondary | ICD-10-CM

## 2023-06-01 ENCOUNTER — Other Ambulatory Visit (HOSPITAL_BASED_OUTPATIENT_CLINIC_OR_DEPARTMENT_OTHER): Payer: Self-pay | Admitting: Cardiovascular Disease

## 2023-06-02 LAB — HM DIABETES EYE EXAM

## 2023-06-03 ENCOUNTER — Encounter: Payer: Self-pay | Admitting: Family

## 2023-06-04 ENCOUNTER — Ambulatory Visit (HOSPITAL_BASED_OUTPATIENT_CLINIC_OR_DEPARTMENT_OTHER): Payer: 59 | Admitting: Cardiovascular Disease

## 2023-06-04 VITALS — BP 118/68 | HR 70 | Ht 67.0 in | Wt 201.2 lb

## 2023-06-04 DIAGNOSIS — I10 Essential (primary) hypertension: Secondary | ICD-10-CM | POA: Diagnosis not present

## 2023-06-04 DIAGNOSIS — R079 Chest pain, unspecified: Secondary | ICD-10-CM | POA: Diagnosis not present

## 2023-06-04 DIAGNOSIS — E782 Mixed hyperlipidemia: Secondary | ICD-10-CM | POA: Diagnosis not present

## 2023-06-04 DIAGNOSIS — G4733 Obstructive sleep apnea (adult) (pediatric): Secondary | ICD-10-CM

## 2023-06-04 NOTE — Progress Notes (Signed)
[facility] Follow-up:    Date:  06/05/2023   ID:  Amanda Henry, DOB 01-22-1966, MRN [id number]  PCP:  [name], FNP  Cardiologist:  None  Nephrologist:  Referring MD: [name], FNP   CC: Hypertension  History of Present Illness:    Amanda Henry is a 58 y.o. female with a hx of hypertension, OSA on CPAP, hypothyroidism, hyperparathyroidism, diabetes, hyperlipidemia, and SAH, here for follow-up. She was initially seen 03/20/2022 by Gillian Shields, NP in the [facility]. She had been seen in the ED with hypertensive emergency and blood pressure of 217/101. She was already on olmesartan, amlodipine, and metoprolol. Amlodipine was increased but blood pressures remained uncontrolled. Olmesartan was increased and metoprolol was discontinued. She had a sleep study that showed mild OSA and possible atrial fibrillation. CPAP was recommended. Renal artery dopplers were ordered but not yet completed. Cortisol and metanephrines were normal. Renin and aldosterone were not tested. On 05/15/22 she reported home blood pressures ranging from 145/87 to 174/100. Her amlodipine-olmesartan was increased to 10-40 mg.  At her visit 05/2022 blood pressures remained uncontrolled.  She was no longer exercising as regularly.  She was enrolled in our remote patient monitoring study and hydrochlorothiazide was added.  She was found to have sleep apnea and started on CPAP.  Amanda Henry presents with a month-long history of intermittent chest discomfort, primarily noticed while lying on her side. The discomfort, described as internal and located in the heart area, lasts for about a minute or two and is more noticeable at night. She denies any associated shortness of breath, sweating, nausea, or leg swelling. She also reports no exertional triggers, and the discomfort seems to resolve upon changing position to lying on her back.  In addition to the chest discomfort, she  reports occasional dizziness upon waking up in the morning, lasting no longer than thirty seconds. She also experiences what she believes to be hot flashes, for which she has already consulted a gynecologist.  Amanda Henry also mentions a sensation of pain while swallowing, which she has noticed sometimes after eating. She is currently on amlodipine and olmesartan for hypertension, which she takes in the afternoon, and thyroid medication, which she takes in the morning.  She has not been engaging in regular exercise and has not noticed any correlation between physical activity and her symptoms. She has a history of thyroid issues and hypertension, for which she is on medication. She has not previously seen a gastroenterologist but has undergone a colonoscopy.     Previous antihypertensives:   Past Medical History:  Diagnosis Date   Allergy    Anxiety    Arthritis    Blood transfusion without reported diagnosis    Depression    DM2 (diabetes mellitus, type 2) (HCC)    Hypercalcemia 04/03/2021   Hypertension    no medications at this time   Hypokalemia 04/03/2021   Hypothyroidism    Iron deficiency anemia    Obesity    OSA on CPAP    mild obstructive sleep apnea with an AHI of 7.7/h.REM sleep at 22/hr   Palpitations    Subarachnoid hemorrhage (HCC)    Thyroid disease    Vitamin D deficiency     Past Surgical History:  Procedure Laterality Date   IR ANGIO INTRA EXTRACRAN SEL COM CAROTID INNOMINATE UNI L MOD SED  03/16/2021   IR ANGIO INTRA EXTRACRAN SEL COM CAROTID INNOMINATE UNI L MOD SED  03/26/2021   IR ANGIO INTRA  EXTRACRAN SEL INTERNAL CAROTID UNI R MOD SED  03/15/2021   IR ANGIO INTRA EXTRACRAN SEL INTERNAL CAROTID UNI R MOD SED  03/26/2021   IR ANGIO INTRA EXTRACRAN SEL INTERNAL CAROTID UNI R MOD SED  03/25/2021   IR ANGIO INTRA EXTRACRAN SEL INTERNAL CAROTID UNI R MOD SED  10/29/2021   IR ANGIO VERTEBRAL SEL VERTEBRAL UNI L MOD SED  03/15/2021   IR ANGIO VERTEBRAL SEL  VERTEBRAL UNI L MOD SED  03/26/2021   IR ANGIOGRAM FOLLOW UP STUDY  03/26/2021   IR ANGIOGRAM FOLLOW UP STUDY  03/26/2021   IR TRANSCATH/EMBOLIZ  03/26/2021   IR US GUIDE VASC ACCESS RIGHT  03/26/2021   IR US GUIDE VASC ACCESS RIGHT  10/29/2021   LAPAROSCOPIC VAGINAL HYSTERECTOMY WITH SALPINGECTOMY Bilateral 10/01/2016   Procedure: LAPAROSCOPIC ASSISTED VAGINAL HYSTERECTOMY WITH SALPINGECTOMY;  Surgeon: Huel Cote, MD;  Location: [facility];  Service: Gynecology;  Laterality: Bilateral;   PARATHYROIDECTOMY Right 04/05/2021   Procedure: right inferioir PARATHYROIDECTOMY;  Surgeon: Darnell Level, MD;  Location: [facility];  Service: General;  Laterality: Right;   RADIOLOGY WITH ANESTHESIA N/A 03/15/2021   Procedure: IR WITH ANESTHESIA;  Surgeon: Lisbeth Renshaw, MD;  Location: [facility];  Service: Radiology;  Laterality: N/A;   RADIOLOGY WITH ANESTHESIA N/A 03/26/2021   Procedure: IR WITH ANESTHESIA (Coiling and Embolization);  Surgeon: Lisbeth Renshaw, MD;  Location: [facility] OR;  Service: Radiology;  Laterality: N/A;   WISDOM TOOTH EXTRACTION      Current Medications: Current Meds  Medication Sig   ALPRAZolam (XANAX XR) 1 MG 24 hr tablet Take 0.5 mg by mouth daily as needed for sleep.   amLODipine-olmesartan (AZOR) 10-40 MG tablet TAKE 1 TABLET BY MOUTH DAILY   atorvastatin (LIPITOR) 20 MG tablet TAKE 1 TABLET(20 MG) BY MOUTH DAILY   Blood Glucose Monitoring Suppl (ACCU-CHEK GUIDE ME) w/Device KIT USE AS DIRECTED FOUR TIMES DAILY   escitalopram (LEXAPRO) 10 MG tablet Take 1 tablet (10 mg total) by mouth daily.   fexofenadine (ALLEGRA ALLERGY) 180 MG tablet Take 1 tablet (180 mg total) by mouth daily.   Glucagon (GVOKE HYPOPEN 2-PACK) 0.5 MG/0.1ML SOAJ Inject 0.1 mg into the skin as needed.   glucose blood test strip Use as instructed   hydrochlorothiazide (HYDRODIURIL) 25 MG tablet TAKE 1 TABLET(25 MG) BY MOUTH DAILY   levothyroxine (SYNTHROID) 125 MCG tablet TAKE 1 TABLET(125 MCG) BY MOUTH DAILY    MAGNESIUM GLYCINATE PO Take 500 mg by mouth.   meclizine (ANTIVERT) 12.5 MG tablet Take 1 tablet (12.5 mg total) by mouth 3 (three) times daily as needed for dizziness.   metoprolol tartrate (LOPRESSOR) 25 MG tablet Take 0.5 tablets (12.5 mg total) by mouth every 12 (twelve) hours as needed (palpitations).   OXcarbazepine (TRILEPTAL) 150 MG tablet Take 150 mg by mouth at bedtime.   polyethylene glycol (MIRALAX) 17 g packet Take 17 g by mouth daily.   Semaglutide,0.25 or 0.5MG /DOS, 2 MG/3ML SOPN Inject 0.5 mg into the skin once a week.   Vitamin D, Ergocalciferol, (DRISDOL) 1.25 MG (50000 UNIT) CAPS capsule Take 1 capsule (50,000 Units total) by mouth every 7 (seven) days.     Allergies:   Latex and Penicillins   Social History   Socioeconomic History   Marital status: Single    Spouse name: Not on file   Number of children: 1   Years of education: Not on file   Highest education level: Bachelor's degree (e.g., BA, AB, BS)  Occupational History   Occupation:  Elementary school educator  Tobacco Use   Smoking status: Never   Smokeless tobacco: Never  Substance and Sexual Activity   Alcohol use: No   Drug use: No   Sexual activity: Not Currently    Birth control/protection: Condom  Other Topics Concern   Not on file  Social History Narrative   Not on file   Social Drivers of Health   Financial Resource Strain: Low Risk  (02/26/2023)   Overall Financial Resource Strain (CARDIA)    Difficulty of Paying Living Expenses: Not hard at all  Food Insecurity: No Food Insecurity (02/26/2023)   Hunger Vital Sign    Worried About Running Out of Food in the Last Year: Never true    Ran Out of Food in the Last Year: Never true  Transportation Needs: No Transportation Needs (11/04/2022)   PRAPARE - Administrator, Civil Service (Medical): No    Lack of Transportation (Non-Medical): No  Physical Activity: Insufficiently Active (11/04/2022)   Exercise Vital Sign    Days of  Exercise per Week: 3 days    Minutes of Exercise per Session: 20 min  Stress: Stress Concern Present (02/26/2023)   [employer] of Occupational Health - Occupational Stress Questionnaire    Feeling of Stress : To some extent  Social Connections: Moderately Integrated (11/04/2022)   Social Connection and Isolation Panel [NHANES]    Frequency of Communication with Friends and Family: More than three times a week    Frequency of Social Gatherings with Friends and Family: Once a week    Attends Religious Services: More than 4 times per year    Active Member of [employer] or Organizations: Yes    Attends Engineer, structural: More than 4 times per year    Marital Status: Never married     Family History: The patient's family history includes ADD / ADHD in her son; Diabetes in her father and another family member; Hearing loss in her father; Heart disease in an other family member; Hypertension in her mother and another family member; Thyroid disease in her mother and another family member.  ROS:   Please see the history of present illness.    (+) Palpitations (+) Headaches (+) Red eyes (+) LE muscle cramps All other systems reviewed and are negative.  EKGs/Labs/Other Studies Reviewed:     EKG:   EKG is personally reviewed. 06/10/2022: EKG was not ordered.  Recent Labs: 07/03/2022: Magnesium 2.4 01/15/2023: TSH 2.94 02/26/2023: ALT 17; BUN 13; Creatinine, Ser 0.99; Hemoglobin 13.1; Platelets 296; Potassium 3.3; Sodium 139   Recent Lipid Panel    Component Value Date/Time   CHOL 163 02/26/2023 1625   TRIG 83 02/26/2023 1625   HDL 53 02/26/2023 1625   CHOLHDL 3.1 02/26/2023 1625   LDLCALC 94 02/26/2023 1625    Physical Exam:    VS:  BP 118/68   Pulse 70   Ht 5\' 7"  (1.702 m)   Wt 201 lb 3.2 oz (91.3 kg)   LMP 08/28/2016 (Exact Date) Comment: come back on 09/15/2016  SpO2 91%   BMI 31.51 kg/m  , BMI Body mass index is 31.51 kg/m. GENERAL:  Well appearing HEENT:  Pupils equal round and reactive, fundi not visualized, oral mucosa unremarkable NECK:  No jugular venous distention, waveform within normal limits, carotid upstroke brisk and symmetric, no bruits, no thyromegaly LUNGS:  Clear to auscultation bilaterally HEART:  RRR.  PMI not displaced or sustained,S1 and S2 within normal limits, no S3, no  S4, no clicks, no rubs, no murmurs ABD:  Flat, positive bowel sounds normal in frequency in pitch, no bruits, no rebound, no guarding, no midline pulsatile mass, no hepatomegaly, no splenomegaly EXT:  2 plus pulses throughout, no edema, no cyanosis, no clubbing SKIN:  No rashes, no nodules NEURO:  Cranial nerves II through XII grossly intact, motor grossly intact throughout PSYCH:  Cognitively intact, oriented to person place and time   ASSESSMENT/PLAN:    # Chest Discomfort Patient reports intermittent chest discomfort for the past month, primarily when lying on her side. No associated shortness of breath, nausea, or sweating. Discomfort resolves when changing position. Likely musculoskeletal or gastroesophageal reflux disease (GERD) related, but due to the location of the discomfort, a cardiac etiology needs to be ruled out. -Order a cardiac CT to rule out coronary artery disease. -Advise patient to extend the time between her last meal and when she lays down to help alleviate potential GERD symptoms.  # Dysphagia Patient reports occasional pain when swallowing. This could be related to GERD or other gastrointestinal issues. -Refer to gastroenterology for further evaluation and management.  # Episodes of Dizziness Patient reports brief episodes of dizziness upon waking in the morning. No clear association with medication timing. -Advise patient to check blood pressure during episodes of dizziness to rule out hypotension.  Follow-up in 1 year unless cardiac CT results indicate a need for sooner follow-up.       Screening for Secondary Hypertension:      03/20/2022    9:31 AM 05/01/2022   11:29 AM  Causes  Drugs/Herbals Screened   Renovascular HTN Not Screened Screened     - Comments Consider in future if BP difficult to control renal duplex ordered  Sleep Apnea Screened Screened     - Comments  completed sleep study  Thyroid Disease Screened   Hyperaldosteronism Not Screened      - Comments Consider in future if BP remains difficult to control, will need to hold Olmesartan prior   Pheochromocytoma Screened   Cushing's Syndrome Screened   Hyperparathyroidism Screened   Coarctation of the Aorta Screened      - Comments BP symmetrical   Compliance Screened     Relevant Labs/Studies:    Latest Ref Rng & Units 02/26/2023    4:25 PM 11/05/2022    2:53 PM 07/03/2022    3:16 PM  Basic Labs  Sodium 134 - 144 mmol/L 139  135  141   Potassium 3.5 - 5.2 mmol/L 3.3  3.7  3.9   Creatinine 0.57 - 1.00 mg/dL 1.61  0.96  0.45        Latest Ref Rng & Units 01/15/2023    9:15 AM 11/05/2022    2:53 PM  Thyroid   TSH 0.35 - 5.50 uIU/mL 2.94  2.140           Latest Ref Rng & Units 03/20/2022    9:31 AM  Metanephrines/Catecholamines   Epinephrine pg/mL CANCELED   Norepinephrine pg/mL CANCELED   Dopamine pg/mL CANCELED   Metanephrines 0.0 - 88.0 pg/mL 50.6   Normetanephrines  0.0 - 244.0 pg/mL 87.2           06/26/2022    8:45 AM  Renovascular   Renal Artery [place] Completed Yes     Disposition:    FU with APP/PharmD in 1 month for the next 3 months.   FU with Dyllan Kats C. Duke Salvia, MD, [facility] in 4 months.  Medication Adjustments/Labs and Tests Ordered:  Current medicines are reviewed at length with the patient today.  Concerns regarding medicines are outlined above.   Orders Placed This Encounter  Procedures   CT CORONARY MORPH W/CTA COR W/SCORE W/CA W/CM &/OR WO/CM   Basic metabolic panel   No orders of the defined types were placed in this encounter.  Signed, Chilton Si, MD  06/05/2023 12:02 AM    [facility]

## 2023-06-04 NOTE — Patient Instructions (Addendum)
Medication Instructions:  TAKE METOPROLOL 50 MG 2 HOURS PRIOR TO CARDIAC CT  Labwork: BMET 1 WEEK PRIOR TO CT   Testing/Procedures: Your physician has requested that you have cardiac CT. Cardiac computed tomography (CT) is a painless test that uses an x-ray machine to take clear, detailed pictures of your heart. For further information please visit [email or web address] Please follow instruction sheet as given.  Follow-Up: 1 YEAR WITH DR [facility]   Any Other Special Instructions Will Be Listed Below (If Applicable).   Your cardiac CT will be scheduled at one of the below locations:     Your cardiac CT will be scheduled at one of the below locations:   [facility] [address] [place], [place] [address] [phone]  OR  [facility] [address] [place], [place] [address] [phone]  OR   [facility] [address] [place], [place] [address] [phone]  OR   [facility] [address] [place], [place] [address] [phone]  If scheduled at [facility], please arrive at the [facility] and Children's Entrance (Entrance C2) of [facility] 30 minutes prior to test start time. You can use the FREE valet parking offered at entrance C (encouraged to control the heart rate for the test)  Proceed to the [facility] Radiology Department (first floor) to check-in and test prep.  All radiology patients and guests should use entrance C2 at [facility], accessed from [facility], even though the hospital's physical address listed is [address]    If scheduled at [facility] or [facility], please arrive 15 mins early for check-in and test prep.  There is spacious parking and easy access to the radiology department from the [facility] entrance. Please enter here and  check-in with the desk attendant.   Please follow these instructions carefully (unless otherwise directed):  An IV will be required for this test and Nitroglycerin will be given.  Hold all erectile dysfunction medications at least 3 days (72 hrs) prior to test. (Ie viagra, cialis, sildenafil, tadalafil, etc)   On the Night Before the Test: Be sure to Drink plenty of water. Do not consume any caffeinated/decaffeinated beverages or chocolate 12 hours prior to your test. Do not take any antihistamines 12 hours prior to your test.  On the Day of the Test: Drink plenty of water until 1 hour prior to the test. Do not eat any food 1 hour prior to test. You may take your regular medications prior to the test.  Take metoprolol (Lopressor) two hours prior to test. If you take Furosemide/Hydrochlorothiazide/Spironolactone/Chlorthalidone, please HOLD on the morning of the test. Patients who wear a continuous glucose monitor MUST remove the device prior to scanning. FEMALES- please wear underwire-free bra if available, avoid dresses & tight clothing      After the Test: Drink plenty of water. After receiving IV contrast, you may experience a mild flushed feeling. This is normal. On occasion, you may experience a mild rash up to 24 hours after the test. This is not dangerous. If this occurs, you can take Benadryl 25 mg and increase your fluid intake. If you experience trouble breathing, this can be serious. If it is severe call 911 IMMEDIATELY. If it is mild, please call our office.  We will call to schedule your test 2-4 weeks out understanding that some insurance companies will need  an authorization prior to the service being performed.   For more information and frequently asked questions, please visit our website : [email or web address]  For non-scheduling related questions, please contact the cardiac imaging nurse navigator should you have any questions/concerns: Cardiac Imaging  Nurse Navigators Direct Office Dial: [phone]   For scheduling needs, including cancellations and rescheduling, please call [place], [phone].      Cardiac CT Angiogram A cardiac CT angiogram is a procedure to look at the heart and the area around the heart. It may be done to help find the cause of chest pains or other symptoms of heart disease. During this procedure, a substance called contrast dye is injected into a vein in the arm. The contrast highlights the blood vessels in the area to be checked. A large X-ray machine (CT scanner), then takes detailed pictures of the heart and the surrounding area. The procedure is also sometimes called a coronary CT angiogram, coronary artery scanning, or CTA. A cardiac CT angiogram allows the health care provider to see how well blood is flowing to and from the heart. The provider will be able to see if there are any problems, such as: Blockage or narrowing of the arteries in the heart. Fluid around the heart. Signs of weakness or disease in the muscles, valves, and tissues of the heart. Tell a health care provider about: Any allergies you have. This is especially important if you have had a previous allergic reaction to medicines, contrast dye, or iodine. All medicines you are taking, including vitamins, herbs, eye drops, creams, and over-the-counter medicines. Any bleeding problems you have. Any surgeries you have had. Any medical conditions you have, including kidney problems or kidney failure. Whether you are pregnant or may be pregnant. Any anxiety disorders, chronic pain, or other conditions you have. These may increase your stress or prevent you from lying still. Any history of abnormal heart rhythms or heart procedures. What are the risks? Your provider will talk with you about risks. These may include: Bleeding. Infection. Allergic reactions to medicines or dyes. Damage to other structures or organs. Kidney damage from the  contrast dye. Increased risk of cancer from radiation exposure. This risk is low. Talk with your provider about: The risks and benefits of testing. How you can receive the lowest dose of radiation. What happens before the procedure? Wear comfortable clothing and remove any jewelry, glasses, dentures, and hearing aids. Follow instructions from your provider about eating and drinking. These may include: 12 hours before the procedure Avoid caffeine. This includes tea, coffee, soda, energy drinks, and diet pills. Drink plenty of water or other fluids that do not have caffeine in them. Being well hydrated can prevent complications. 4-6 hours before the procedure Stop eating and drinking. This will reduce the risk of nausea from the contrast dye. Ask your provider about changing or stopping your regular medicines. These include: Diabetes medicines. Medicines to treat problems with erections (erectile dysfunction). If you have kidney problems, you may need to receive IV hydration before and after the test. What happens during the procedure?  Hair on your chest may need to be removed so that small sticky patches called electrodes can be placed on your chest. These will transmit information that helps to monitor your heart during the procedure. An IV will be inserted into one of your veins. You might be given a medicine to control your heart rate during the procedure. This will help to ensure that good images are obtained. You will be asked  to lie on an exam table. This table will slide in and out of the CT machine during the procedure. Contrast dye will be injected into the IV. You might feel warm, or you may get a metallic taste in your mouth. You may be given medicines to relax or dilate the arteries in your heart. If you are allergic to contrast dyes or iodine you may be given medicine before the test to reduce the risk of an allergic reaction. The table that you are lying on will move into the  CT machine tunnel for the scan. The person running the machine will give you instructions while the scans are being done. You may be asked to: Keep your arms above your head. Hold your breath for short periods. Stay very still, even if the table is moving. The procedure may vary among providers and hospitals. What can I expect after the procedure? After your procedure, it is common to have: A metallic taste in your mouth from the contrast dye. A feeling of warmth. A headache from the heart medicine. Follow these instructions at home: Take over-the-counter and prescription medicines only as told by your provider. If you are told, drink enough fluid to keep your pee pale yellow. This will help to flush the contrast dye out of your body. Most people can return to their normal activities right after the procedure. Ask your provider what activities are safe for you. It is up to you to get the results of your procedure. Ask your provider, or the department that is doing the procedure, when your results will be ready. Contact a health care provider if: You have any symptoms of allergy to the contrast dye. These include: Shortness of breath. Rash or hives. A racing heartbeat. You notice a change in your peeing (urination). This information is not intended to replace advice given to you by your health care provider. Make sure you discuss any questions you have with your health care provider. Document Revised: 12/06/2021 Document Reviewed: 12/06/2021 Elsevier Patient Education  2024 [employer].

## 2023-06-05 ENCOUNTER — Encounter: Payer: Self-pay | Admitting: Cardiology

## 2023-06-05 ENCOUNTER — Encounter (HOSPITAL_BASED_OUTPATIENT_CLINIC_OR_DEPARTMENT_OTHER): Payer: Self-pay | Admitting: Cardiovascular Disease

## 2023-06-05 ENCOUNTER — Other Ambulatory Visit: Payer: Self-pay | Admitting: Nurse Practitioner

## 2023-06-05 ENCOUNTER — Encounter: Payer: Self-pay | Admitting: Nurse Practitioner

## 2023-06-05 MED ORDER — HYDROCHLOROTHIAZIDE 25 MG PO TABS: 12.5000 mg | ORAL_TABLET | Freq: Every day | ORAL | Status: AC

## 2023-06-16 ENCOUNTER — Encounter (HOSPITAL_BASED_OUTPATIENT_CLINIC_OR_DEPARTMENT_OTHER): Payer: Self-pay | Admitting: Cardiovascular Disease

## 2023-06-16 ENCOUNTER — Telehealth (HOSPITAL_COMMUNITY): Payer: Self-pay | Admitting: *Deleted

## 2023-06-16 LAB — BASIC METABOLIC PANEL
BUN/Creatinine Ratio: 14 (ref 9–23)
BUN: 13 mg/dL (ref 6–24)
CO2: 23 mmol/L (ref 20–29)
Calcium: 9.6 mg/dL (ref 8.7–10.2)
Chloride: 100 mmol/L (ref 96–106)
Creatinine, Ser: 0.92 mg/dL (ref 0.57–1.00)
Glucose: 124 mg/dL — ABNORMAL HIGH (ref 70–99)
Potassium: 3.5 mmol/L (ref 3.5–5.2)
Sodium: 138 mmol/L (ref 134–144)
eGFR: 74 mL/min/{1.73_m2} (ref >59)

## 2023-06-16 NOTE — Telephone Encounter (Signed)
Attempted to call patient regarding upcoming cardiac CT appointment. Left message on voicemail with name and callback number Johney Frame RN Navigator Cardiac Imaging [facility] V[facility] Services [phone] Office

## 2023-06-16 NOTE — Telephone Encounter (Signed)
Patient calling about her upcoming cardiac imaging study; pt verbalizes understanding of appt date/time, parking situation and where to check in, pre-test NPO status and medications ordered, and verified current allergies; name and call back number provided for further questions should they arise  Larey Brick RN Navigator Cardiac Imaging [facility] [phone] office [phone] cell  Patient to take her PRN metoprolol tartrate two hours prior to her cardiac CT scan.

## 2023-06-17 ENCOUNTER — Ambulatory Visit (HOSPITAL_COMMUNITY)
Admission: RE | Admit: 2023-06-17 | Discharge: 2023-06-17 | Disposition: A | Discharge status: Home / Self Care | Payer: 59 | Source: Ambulatory Visit | Attending: Cardiovascular Disease | Admitting: Cardiovascular Disease

## 2023-06-17 DIAGNOSIS — I10 Essential (primary) hypertension: Secondary | ICD-10-CM | POA: Diagnosis present

## 2023-06-17 DIAGNOSIS — R079 Chest pain, unspecified: Secondary | ICD-10-CM

## 2023-06-17 DIAGNOSIS — E782 Mixed hyperlipidemia: Secondary | ICD-10-CM | POA: Diagnosis present

## 2023-06-17 DIAGNOSIS — G4733 Obstructive sleep apnea (adult) (pediatric): Secondary | ICD-10-CM | POA: Diagnosis not present

## 2023-06-17 MED ORDER — NITROGLYCERIN 0.4 MG SL SUBL
0.8000 mg | SUBLINGUAL_TABLET | Freq: Once | SUBLINGUAL | Status: AC
  Administered 2023-06-17: 0.8 mg via SUBLINGUAL

## 2023-06-17 MED ORDER — IOHEXOL 350 MG/ML SOLN
95.0000 mL | Freq: Once | INTRAVENOUS | Status: AC | PRN
  Administered 2023-06-17: 95 mL via INTRAVENOUS

## 2023-06-17 MED ORDER — NITROGLYCERIN 0.4 MG SL SUBL
SUBLINGUAL_TABLET | SUBLINGUAL | Status: AC
  Filled 2023-06-17: qty 2

## 2023-06-29 ENCOUNTER — Ambulatory Visit: Payer: 59 | Admitting: Nurse Practitioner

## 2023-06-29 VITALS — BP 118/80 | HR 71 | Temp 97.6°F | Ht 67.0 in | Wt 201.0 lb

## 2023-06-29 DIAGNOSIS — E039 Hypothyroidism, unspecified: Secondary | ICD-10-CM | POA: Diagnosis not present

## 2023-06-29 DIAGNOSIS — Z6831 Body mass index (BMI) 31.0-31.9, adult: Secondary | ICD-10-CM

## 2023-06-29 DIAGNOSIS — K5904 Chronic idiopathic constipation: Secondary | ICD-10-CM

## 2023-06-29 DIAGNOSIS — E782 Mixed hyperlipidemia: Secondary | ICD-10-CM | POA: Diagnosis not present

## 2023-06-29 DIAGNOSIS — E66811 Obesity, class 1: Secondary | ICD-10-CM

## 2023-06-29 DIAGNOSIS — Z2821 Immunization not carried out because of patient refusal: Secondary | ICD-10-CM

## 2023-06-29 DIAGNOSIS — R0981 Nasal congestion: Secondary | ICD-10-CM

## 2023-06-29 DIAGNOSIS — I10 Essential (primary) hypertension: Secondary | ICD-10-CM | POA: Diagnosis not present

## 2023-06-29 DIAGNOSIS — E1169 Type 2 diabetes mellitus with other specified complication: Secondary | ICD-10-CM | POA: Diagnosis not present

## 2023-06-29 DIAGNOSIS — E6609 Other obesity due to excess calories: Secondary | ICD-10-CM

## 2023-06-29 MED ORDER — AZELASTINE-FLUTICASONE 137-50 MCG/ACT NA SUSP: 2.0000 | Freq: Every day | NASAL | 2 refills | Status: DC

## 2023-06-29 MED ORDER — MOMETASONE FUROATE 50 MCG/ACT NA SUSP: 2.0000 | Freq: Every day | NASAL | 2 refills | Status: DC

## 2023-06-29 MED ORDER — DOXYCYCLINE MONOHYDRATE 100 MG PO CAPS
100.0000 mg | ORAL_CAPSULE | Freq: Two times a day (BID) | ORAL | 0 refills | Status: DC
Start: 2023-06-29 — End: 2024-03-09

## 2023-06-29 NOTE — Assessment & Plan Note (Signed)
Will check cholesterol, continue statin, tolerating well.

## 2023-06-29 NOTE — Assessment & Plan Note (Signed)
 She is encouraged to strive for BMI less than 30 to decrease cardiac risk. Advised to aim for at least 150 minutes of exercise per week.

## 2023-06-29 NOTE — Assessment & Plan Note (Signed)
Continue levothyroxine, will check levels. Last visit to Endocrinology was Feb 2024

## 2023-06-29 NOTE — Assessment & Plan Note (Signed)
Blood pressure is well controlled, continue f/u with cardiology

## 2023-06-29 NOTE — Assessment & Plan Note (Signed)
 HgbA1c was 7.4 at last visit continue Ozempic  0.5 mg.

## 2023-06-29 NOTE — Progress Notes (Signed)
 [name], CMA,acting as a Neurosurgeon for Amanda Felts, FNP.,have documented all relevant documentation on the behalf of Amanda Felts, FNP,as directed by  Amanda Felts, FNP while in the presence of Amanda Felts, FNP.  Subjective:  Patient ID: Amanda Henry , female    DOB: 08-Sep-1965 , 58 y.o.   MRN: [id number]  Chief Complaint  Patient presents with   Hypertension    HPI  Patient presents today for a bp and dm follow up, Patient reports compliance with medication. Patient denies any chest pain, SOB, or headaches. Patient reports she has been nausea for a couple of weeks and sinus pressure in face and back of her head for a couple of months. he is concerned that her thyroid may be off. She continues to see the Endocrinologist. She is not taking the nasal spray since drying her out. Patient reports when she wakes up her head sometimes feels numb.  Continues to be on Ozempic 0.5 mg weekly. She is taking miralax due to constipation. She is trying to eat healthy.      Past Medical History:  Diagnosis Date   Allergy    Anxiety    Arthritis    Blood transfusion without reported diagnosis    Depression    DM2 (diabetes mellitus, type 2) (HCC)    Hypercalcemia 04/03/2021   Hypertension    no medications at this time   Hypokalemia 04/03/2021   Hypothyroidism    Iron deficiency anemia    Obesity    OSA on CPAP    mild obstructive sleep apnea with an AHI of 7.7/h.REM sleep at 22/hr   Palpitations    Subarachnoid hemorrhage (HCC)    Thyroid disease    Vitamin D deficiency      Family History  Problem Relation Age of Onset   Thyroid disease Mother    Hypertension Mother    Diabetes Father    Hearing loss Father    Hypertension Other    Diabetes Other    Thyroid disease Other    Heart disease Other    ADD / ADHD Son      Current Outpatient Medications:    ALPRAZolam (XANAX XR) 1 MG 24 hr tablet, Take 0.5 mg by mouth daily as needed for sleep., Disp: , Rfl:     amLODipine-olmesartan (AZOR) 10-40 MG tablet, TAKE 1 TABLET BY MOUTH DAILY, Disp: 90 tablet, Rfl: 1   atorvastatin (LIPITOR) 20 MG tablet, TAKE 1 TABLET(20 MG) BY MOUTH DAILY, Disp: 100 tablet, Rfl: 0   Blood Glucose Monitoring Suppl (ACCU-CHEK GUIDE ME) w/Device KIT, USE AS DIRECTED FOUR TIMES DAILY, Disp: 1 kit, Rfl: 1   doxycycline (MONODOX) 100 MG capsule, Take 1 capsule (100 mg total) by mouth 2 (two) times daily., Disp: 14 capsule, Rfl: 0   escitalopram (LEXAPRO) 10 MG tablet, Take 1 tablet (10 mg total) by mouth daily., Disp: 30 tablet, Rfl: 1   fexofenadine (ALLEGRA ALLERGY) 180 MG tablet, Take 1 tablet (180 mg total) by mouth daily., Disp: 90 tablet, Rfl: 1   Glucagon (GVOKE HYPOPEN 2-PACK) 0.5 MG/0.1ML SOAJ, Inject 0.1 mg into the skin as needed., Disp: 0.1 mL, Rfl: 6   glucose blood (ACCU-CHEK GUIDE TEST) test strip, USE EVERY DAY AS DIRECTED, Disp: 100 strip, Rfl: 2   hydrochlorothiazide (HYDRODIURIL) 25 MG tablet, Take 0.5 tablets (12.5 mg total) by mouth daily., Disp: , Rfl:    levothyroxine (SYNTHROID) 125 MCG tablet, TAKE 1 TABLET(125 MCG) BY MOUTH DAILY, Disp: 90 tablet, Rfl: 3  MAGNESIUM GLYCINATE PO, Take 500 mg by mouth., Disp: , Rfl:    meclizine (ANTIVERT) 12.5 MG tablet, Take 1 tablet (12.5 mg total) by mouth 3 (three) times daily as needed for dizziness., Disp: 30 tablet, Rfl: 1   metoprolol tartrate (LOPRESSOR) 25 MG tablet, Take 0.5 tablets (12.5 mg total) by mouth every 12 (twelve) hours as needed (palpitations)., Disp: 15 tablet, Rfl: 1   mometasone (NASONEX) 50 MCG/ACT nasal spray, Place 2 sprays into the nose daily., Disp: 1 each, Rfl: 2   OXcarbazepine (TRILEPTAL) 150 MG tablet, Take 150 mg by mouth at bedtime., Disp: , Rfl:    polyethylene glycol (MIRALAX) 17 g packet, Take 17 g by mouth daily., Disp: 90 packet, Rfl: 1   Semaglutide,0.25 or 0.5MG /DOS, 2 MG/3ML SOPN, Inject 0.5 mg into the skin once a week., Disp: 3 mL, Rfl: 2   Vitamin D, Ergocalciferol, (DRISDOL)  1.25 MG (50000 UNIT) CAPS capsule, Take 1 capsule (50,000 Units total) by mouth every 7 (seven) days., Disp: 15 capsule, Rfl: 3   Allergies  Allergen Reactions   Latex Rash   Penicillins Rash    Has patient had a PCN reaction causing immediate rash, facial/tongue/throat swelling, SOB or lightheadedness with hypotension: Yes Has patient had a PCN reaction causing severe rash involving mucus membranes or skin necrosis: No Has patient had a PCN reaction that required hospitalization No Has patient had a PCN reaction occurring within the last 10 years: No If all of the above answers are "NO", then may proceed with Cephalosporin use.      Review of Systems  Constitutional: Negative.   HENT: Negative.    Eyes: Negative.   Respiratory: Negative.    Cardiovascular: Negative.  Negative for chest pain, palpitations and leg swelling.  Gastrointestinal: Negative.      Today's Vitals   06/29/23 1205  BP: 118/80  Pulse: 71  Temp: 97.6 F (36.4 C)  TempSrc: Oral  Weight: 201 lb (91.2 kg)  Height: 5\' 7"  (1.702 m)  PainSc: 0-No pain   Body mass index is 31.48 kg/m.  Wt Readings from Last 3 Encounters:  06/29/23 201 lb (91.2 kg)  06/04/23 201 lb 3.2 oz (91.3 kg)  04/14/23 200 lb (90.7 kg)    Objective:  Physical Exam Vitals reviewed.  Constitutional:      General: She is not in acute distress.    Appearance: Normal appearance. She is obese.  Cardiovascular:     Rate and Rhythm: Normal rate and regular rhythm.     Pulses: Normal pulses.     Heart sounds: Normal heart sounds. No murmur heard. Pulmonary:     Effort: Pulmonary effort is normal. No respiratory distress.     Breath sounds: Normal breath sounds. No wheezing.  Skin:    General: Skin is warm and dry.     Capillary Refill: Capillary refill takes less than 2 seconds.  Neurological:     General: No focal deficit present.     Mental Status: She is alert and oriented to person, place, and time.     Cranial Nerves: No  cranial nerve deficit.     Motor: No weakness.  Psychiatric:        Mood and Affect: Mood normal.        Behavior: Behavior normal.        Thought Content: Thought content normal.        Judgment: Judgment normal.         Assessment And Plan:  Type 2  diabetes mellitus with obesity (HCC) Assessment & Plan: HgbA1c was 7.4 at last visit continue Ozempic 0.5 mg.   Orders: -     Hemoglobin A1c -     Microalbumin / creatinine urine ratio  Mixed hyperlipidemia Assessment & Plan: Will check cholesterol, continue statin, tolerating well.   Orders: -     Lipid panel  Essential hypertension Assessment & Plan: Blood pressure is well controlled, continue f/u with cardiology   Acquired hypothyroidism Assessment & Plan: Continue levothyroxine, will check levels. Last visit to Endocrinology was Feb 2024  Orders: -     TSH + free T4  COVID-19 vaccination declined  Class 1 obesity due to excess calories with serious comorbidity and body mass index (BMI) of 31.0 to 31.9 in adult Assessment & Plan: She is encouraged to strive for BMI less than 30 to decrease cardiac risk. Advised to aim for at least 150 minutes of exercise per week.    Chronic idiopathic constipation Assessment & Plan: She is to f/u with Dr. Loreta Ave.    Nasal congestion -     Mometasone Furoate; Place 2 sprays into the nose daily.  Dispense: 1 each; Refill: 2  Other orders -     Doxycycline Monohydrate; Take 1 capsule (100 mg total) by mouth 2 (two) times daily.  Dispense: 14 capsule; Refill: 0   Return for controlled DM check 4 months.  Patient was given opportunity to ask questions. Patient verbalized understanding of the plan and was able to repeat key elements of the plan. All questions were answered to their satisfaction.    Jeanell Sparrow, FNP, have reviewed all documentation for this visit. The documentation on 06/29/23 for the exam, diagnosis, procedures, and orders are all accurate and complete.    IF YOU HAVE BEEN REFERRED TO A SPECIALIST, IT MAY TAKE 1-2 WEEKS TO SCHEDULE/PROCESS THE REFERRAL. IF YOU HAVE NOT HEARD FROM US/SPECIALIST IN TWO WEEKS, PLEASE GIVE [place] A CALL AT [phone].

## 2023-06-29 NOTE — Assessment & Plan Note (Signed)
 She is to f/u with Dr. Tova Fresh.

## 2023-06-30 LAB — MICROALBUMIN / CREATININE URINE RATIO
Creatinine, Urine: 34.8 mg/dL
Microalb/Creat Ratio: <9 mg/g{creat} (ref 0–29)
Microalbumin, Urine: <3 ug/mL

## 2023-06-30 LAB — LIPID PANEL
Chol/HDL Ratio: 3.4 {ratio} (ref 0.0–4.4)
Cholesterol, Total: 157 mg/dL (ref 100–199)
HDL: 46 mg/dL (ref >39)
LDL Chol Calc (NIH): 93 mg/dL (ref 0–99)
Triglycerides: 97 mg/dL (ref 0–149)
VLDL Cholesterol Cal: 18 mg/dL (ref 5–40)

## 2023-06-30 LAB — TSH+FREE T4
Free T4: 1.42 ng/dL (ref 0.82–1.77)
TSH: 1.01 u[IU]/mL (ref 0.450–4.500)

## 2023-06-30 LAB — HEMOGLOBIN A1C
Est. average glucose Bld gHb Est-mCnc: 143 mg/dL
Hgb A1c MFr Bld: 6.6 % — ABNORMAL HIGH (ref 4.8–5.6)

## 2023-07-01 ENCOUNTER — Encounter: Payer: Self-pay | Admitting: Nurse Practitioner

## 2023-07-02 ENCOUNTER — Other Ambulatory Visit: Payer: Self-pay | Admitting: Family Medicine

## 2023-07-03 ENCOUNTER — Encounter (HOSPITAL_BASED_OUTPATIENT_CLINIC_OR_DEPARTMENT_OTHER): Payer: Self-pay | Admitting: Cardiovascular Disease

## 2023-07-03 ENCOUNTER — Encounter: Payer: Self-pay | Admitting: Nurse Practitioner

## 2023-07-08 ENCOUNTER — Encounter (HOSPITAL_BASED_OUTPATIENT_CLINIC_OR_DEPARTMENT_OTHER): Payer: Self-pay

## 2023-07-15 ENCOUNTER — Ambulatory Visit: Payer: 59 | Attending: Cardiology

## 2023-07-15 NOTE — Progress Notes (Deleted)
 Patient ID: Amanda Henry                 DOB: 03/16/1966                      MRN: [id number]      HPI: Amanda Henry is a 58 y.o. female referred by Dr. Mayford Knife to HTN clinic. PMH is significant for  Current HTN meds:  Previously tried:  BP goal:   Family History:   Social History:   Diet:   Exercise:  {types:28256}  Home BP readings:  Date SBP/DBP  HR              Average      Wt Readings from Last 3 Encounters:  06/29/23 201 lb (91.2 kg)  06/04/23 201 lb 3.2 oz (91.3 kg)  04/14/23 200 lb (90.7 kg)   BP Readings from Last 3 Encounters:  06/29/23 118/80  06/17/23 116/75  06/04/23 118/68   Pulse Readings from Last 3 Encounters:  06/29/23 71  06/04/23 70  03/19/23 64    Renal function: CrCl cannot be calculated (Patient's most recent lab result is older than the maximum 21 days allowed.).  Past Medical History:  Diagnosis Date   Allergy    Anxiety    Arthritis    Blood transfusion without reported diagnosis    Depression    DM2 (diabetes mellitus, type 2) (HCC)    Hypercalcemia 04/03/2021   Hypertension    no medications at this time   Hypokalemia 04/03/2021   Hypothyroidism    Iron deficiency anemia    Obesity    OSA on CPAP    mild obstructive sleep apnea with an AHI of 7.7/h.REM sleep at 22/hr   Palpitations    Subarachnoid hemorrhage (HCC)    Thyroid disease    Vitamin D deficiency     Current Outpatient Medications on File Prior to Visit  Medication Sig Dispense Refill   ALPRAZolam (XANAX XR) 1 MG 24 hr tablet Take 0.5 mg by mouth daily as needed for sleep.     amLODipine-olmesartan (AZOR) 10-40 MG tablet TAKE 1 TABLET BY MOUTH DAILY 90 tablet 1   atorvastatin (LIPITOR) 20 MG tablet TAKE 1 TABLET(20 MG) BY MOUTH DAILY 100 tablet 0   Blood Glucose Monitoring Suppl (ACCU-CHEK GUIDE ME) w/Device KIT USE AS DIRECTED FOUR TIMES DAILY 1 kit 1   doxycycline (MONODOX) 100 MG capsule Take 1 capsule (100 mg total) by mouth 2 (two) times  daily. 14 capsule 0   escitalopram (LEXAPRO) 10 MG tablet Take 1 tablet (10 mg total) by mouth daily. 30 tablet 1   fexofenadine (ALLEGRA ALLERGY) 180 MG tablet Take 1 tablet (180 mg total) by mouth daily. 90 tablet 1   Glucagon (GVOKE HYPOPEN 2-PACK) 0.5 MG/0.1ML SOAJ Inject 0.1 mg into the skin as needed. 0.1 mL 6   glucose blood (ACCU-CHEK GUIDE TEST) test strip USE EVERY DAY AS DIRECTED 100 strip 2   hydrochlorothiazide (HYDRODIURIL) 25 MG tablet Take 0.5 tablets (12.5 mg total) by mouth daily.     levothyroxine (SYNTHROID) 125 MCG tablet TAKE 1 TABLET(125 MCG) BY MOUTH DAILY 90 tablet 3   MAGNESIUM GLYCINATE PO Take 500 mg by mouth.     meclizine (ANTIVERT) 12.5 MG tablet Take 1 tablet (12.5 mg total) by mouth 3 (three) times daily as needed for dizziness. 30 tablet 1   metoprolol tartrate (LOPRESSOR) 25 MG tablet Take 0.5 tablets (12.5 mg total) by mouth every 12 (  twelve) hours as needed (palpitations). 15 tablet 1   mometasone (NASONEX) 50 MCG/ACT nasal spray Place 2 sprays into the nose daily. 1 each 2   OXcarbazepine (TRILEPTAL) 150 MG tablet Take 150 mg by mouth at bedtime.     Semaglutide,0.25 or 0.5MG /DOS, 2 MG/3ML SOPN Inject 0.5 mg into the skin once a week. 3 mL 2   Vitamin D, Ergocalciferol, (DRISDOL) 1.25 MG (50000 UNIT) CAPS capsule Take 1 capsule (50,000 Units total) by mouth every 7 (seven) days. 15 capsule 3   No current facility-administered medications on file prior to visit.    Allergies  Allergen Reactions   Latex Rash   Penicillins Rash    Has patient had a PCN reaction causing immediate rash, facial/tongue/throat swelling, SOB or lightheadedness with hypotension: Yes Has patient had a PCN reaction causing severe rash involving mucus membranes or skin necrosis: No Has patient had a PCN reaction that required hospitalization No Has patient had a PCN reaction occurring within the last 10 years: No If all of the above answers are "NO", then may proceed with  Cephalosporin use.     Last menstrual period 08/28/2016.   Assessment/Plan:  1. Hypertension -  No problem-specific Assessment & Plan notes found for this encounter.      Thank you  Carmela Hurt, Pharm.D [facility] A Division of [facility] [address] [place], [place] [address]  Phone: [phone]; Fax: [phone]

## 2023-07-16 NOTE — Telephone Encounter (Signed)
 Labs were printed 2/10 for Dr Duke Salvia to review

## 2023-07-23 ENCOUNTER — Telehealth: Payer: Self-pay

## 2023-07-23 NOTE — Telephone Encounter (Signed)
 PA for Ozempic approved -  07/22/2023 - 07/22/2026

## 2023-08-16 ENCOUNTER — Other Ambulatory Visit (HOSPITAL_BASED_OUTPATIENT_CLINIC_OR_DEPARTMENT_OTHER): Payer: Self-pay | Admitting: Family

## 2023-08-16 DIAGNOSIS — I10 Essential (primary) hypertension: Secondary | ICD-10-CM

## 2023-08-19 ENCOUNTER — Other Ambulatory Visit: Payer: Self-pay

## 2023-08-19 DIAGNOSIS — E1169 Type 2 diabetes mellitus with other specified complication: Secondary | ICD-10-CM

## 2023-08-19 MED ORDER — SEMAGLUTIDE(0.25 OR 0.5MG/DOS) 2 MG/3ML ~~LOC~~ SOPN
0.5000 mg | PEN_INJECTOR | SUBCUTANEOUS | 2 refills | Status: DC
  Filled 2024-02-01: qty 3, 28d supply, fill #0

## 2023-09-10 ENCOUNTER — Encounter: Payer: Self-pay | Admitting: Nurse Practitioner

## 2023-09-10 ENCOUNTER — Ambulatory Visit: Admitting: Nurse Practitioner

## 2023-09-10 VITALS — BP 100/68 | HR 68 | Temp 98.5°F | Ht 67.0 in | Wt 201.0 lb

## 2023-09-10 DIAGNOSIS — Z2821 Immunization not carried out because of patient refusal: Secondary | ICD-10-CM

## 2023-09-10 DIAGNOSIS — E669 Obesity, unspecified: Secondary | ICD-10-CM

## 2023-09-10 DIAGNOSIS — Z111 Encounter for screening for respiratory tuberculosis: Secondary | ICD-10-CM

## 2023-09-10 DIAGNOSIS — E1169 Type 2 diabetes mellitus with other specified complication: Secondary | ICD-10-CM

## 2023-09-10 NOTE — Progress Notes (Signed)
 [place], CMA,acting as a Neurosurgeon for [name], FNP.,have documented all relevant documentation on the behalf of [name], FNP,as directed by  [name], FNP while in the presence of [name], FNP.  Subjective:  Patient ID: Amanda Henry , female    DOB: 05-Jan-1966 , 58 y.o.   MRN: [id number]  Chief Complaint  Patient presents with   Hypertension    HPI  Patient presents today for a bump under left breast, patient reports the bump is sore and she first noticed it about a month ago.   She reports it first looked like a pimple but got bigger.   Checks bp at home, average 111/75.  Has cut salt from her diet, but has sugar cravings at night.  She has tried to eat more fruit to satisfy the cravings instead of processed sugars.  Has had 2 episodes with lowered blood glucose in 60s, able to reverse with glucose tablets.  At those times she had not ate enough.  She walks once a week for 30 minutes.  Drinks 64oz of water daily.     Hypertension This is a chronic problem. The current episode started more than 1 year ago. The problem has been gradually improving since onset. The problem is controlled. Associated symptoms include sweats. Pertinent negatives include no blurred vision, chest pain, headaches, palpitations or shortness of breath. There are no associated agents to hypertension. Risk factors for coronary artery disease include sedentary lifestyle, post-menopausal state, obesity, family history, diabetes mellitus and dyslipidemia. Past treatments include beta blockers and calcium  channel blockers. The current treatment provides significant improvement. Compliance problems include diet and exercise.  There is no history of kidney disease or retinopathy. Identifiable causes of hypertension include a thyroid  problem.  Diabetes She presents for her follow-up diabetic visit. She has type 2 diabetes mellitus. No MedicAlert identification noted. Her disease course has been  stable. Hypoglycemia symptoms include nervousness/anxiousness, sleepiness and sweats. Pertinent negatives for hypoglycemia include no headaches. Pertinent negatives for diabetes include no blurred vision, no chest pain, no fatigue, no polydipsia, no polyphagia and no polyuria. There are no hypoglycemic complications. Symptoms are stable. Pertinent negatives for diabetic complications include no retinopathy. Risk factors for coronary artery disease include diabetes mellitus, dyslipidemia, obesity and sedentary lifestyle. Current diabetic treatment includes diet and oral agent (monotherapy). She is compliant with treatment all of the time. She is following a generally healthy and low salt diet. Meal planning includes avoidance of concentrated sweets. She has not had a previous visit with a dietitian. She participates in exercise weekly. Her overall blood glucose range is 90-110 mg/dl. An ACE inhibitor/angiotensin II receptor blocker is not being taken. She does not see a podiatrist.Eye exam is current.     Past Medical History:  Diagnosis Date   Allergy    Anxiety    Arthritis    Blood transfusion without reported diagnosis    Depression    DM2 (diabetes mellitus, type 2) (HCC)    Hypercalcemia 04/03/2021   Hypertension    no medications at this time   Hypokalemia 04/03/2021   Hypothyroidism    Iron  deficiency anemia    Obesity    OSA on CPAP    mild obstructive sleep apnea with an AHI of 7.7/h.REM sleep at 22/hr   Palpitations    Subarachnoid hemorrhage (HCC)    Thyroid  disease    Vitamin D  deficiency      Family History  Problem Relation Age of Onset   Thyroid  disease  Mother    Hypertension Mother    Diabetes Father    Hearing loss Father    Hypertension Other    Diabetes Other    Thyroid  disease Other    Heart disease Other    ADD / ADHD Son      Current Outpatient Medications:    ALPRAZolam  (XANAX  XR) 1 MG 24 hr tablet, Take 0.5 mg by mouth daily as needed for sleep., Disp:  , Rfl:    amLODipine -olmesartan  (AZOR ) 10-40 MG tablet, TAKE 1 TABLET BY MOUTH DAILY, Disp: 90 tablet, Rfl: 1   atorvastatin  (LIPITOR) 20 MG tablet, TAKE 1 TABLET(20 MG) BY MOUTH DAILY, Disp: 100 tablet, Rfl: 0   Blood Glucose Monitoring Suppl (ACCU-CHEK GUIDE ME) w/Device KIT, USE AS DIRECTED FOUR TIMES DAILY, Disp: 1 kit, Rfl: 1   doxycycline  (MONODOX ) 100 MG capsule, Take 1 capsule (100 mg total) by mouth 2 (two) times daily., Disp: 14 capsule, Rfl: 0   escitalopram  (LEXAPRO ) 10 MG tablet, Take 1 tablet (10 mg total) by mouth daily., Disp: 30 tablet, Rfl: 1   fexofenadine  (ALLEGRA  ALLERGY) 180 MG tablet, Take 1 tablet (180 mg total) by mouth daily., Disp: 90 tablet, Rfl: 1   Glucagon  (GVOKE HYPOPEN  2-PACK) 0.5 MG/0.1ML SOAJ, Inject 0.1 mg into the skin as needed., Disp: 0.1 mL, Rfl: 6   glucose blood (ACCU-CHEK GUIDE TEST) test strip, USE EVERY DAY AS DIRECTED, Disp: 100 strip, Rfl: 2   hydrochlorothiazide  (HYDRODIURIL ) 25 MG tablet, Take 0.5 tablets (12.5 mg total) by mouth daily., Disp: , Rfl:    levothyroxine  (SYNTHROID ) 125 MCG tablet, TAKE 1 TABLET(125 MCG) BY MOUTH DAILY, Disp: 90 tablet, Rfl: 3   MAGNESIUM  GLYCINATE PO, Take 500 mg by mouth., Disp: , Rfl:    meclizine  (ANTIVERT ) 12.5 MG tablet, Take 1 tablet (12.5 mg total) by mouth 3 (three) times daily as needed for dizziness., Disp: 30 tablet, Rfl: 1   metoprolol  tartrate (LOPRESSOR ) 25 MG tablet, Take 0.5 tablets (12.5 mg total) by mouth every 12 (twelve) hours as needed (palpitations)., Disp: 15 tablet, Rfl: 1   mometasone  (NASONEX ) 50 MCG/ACT nasal spray, Place 2 sprays into the nose daily., Disp: 1 each, Rfl: 2   OXcarbazepine (TRILEPTAL) 150 MG tablet, Take 150 mg by mouth at bedtime., Disp: , Rfl:    Semaglutide ,0.25 or 0.5MG /DOS, 2 MG/3ML SOPN, Inject 0.5 mg into the skin once a week., Disp: 3 mL, Rfl: 2   Vitamin D , Ergocalciferol , (DRISDOL ) 1.25 MG (50000 UNIT) CAPS capsule, Take 1 capsule (50,000 Units total) by mouth every 7  (seven) days., Disp: 15 capsule, Rfl: 3   Allergies  Allergen Reactions   Latex Rash   Penicillins Rash    Has patient had a PCN reaction causing immediate rash, facial/tongue/throat swelling, SOB or lightheadedness with hypotension: Yes Has patient had a PCN reaction causing severe rash involving mucus membranes or skin necrosis: No Has patient had a PCN reaction that required hospitalization No Has patient had a PCN reaction occurring within the last 10 years: No If all of the above answers are "NO", then may proceed with Cephalosporin use.      Review of Systems  Constitutional:  Negative for chills, fatigue and fever.  HENT:  Negative for congestion.   Eyes:  Negative for blurred vision.  Respiratory:  Negative for cough, chest tightness and shortness of breath.   Cardiovascular:  Negative for chest pain and palpitations.  Endocrine: Negative for polydipsia, polyphagia and polyuria.  Neurological:  Negative for headaches.  Psychiatric/Behavioral:  The patient is nervous/anxious.   All other systems reviewed and are negative.    Today's Vitals   09/10/23 1624  BP: 100/68  Pulse: 68  Temp: 98.5 F (36.9 C)  TempSrc: Oral  Weight: 201 lb (91.2 kg)  Height: 5\' 7"  (1.702 m)  PainSc: 0-No pain   Body mass index is 31.48 kg/m.  Wt Readings from Last 3 Encounters:  09/10/23 201 lb (91.2 kg)  06/29/23 201 lb (91.2 kg)  06/04/23 201 lb 3.2 oz (91.3 kg)      Objective:  Physical Exam Constitutional:      Appearance: Normal appearance. She is obese.  HENT:     Right Ear: External ear normal.     Left Ear: External ear normal.     Nose: Nose normal.  Cardiovascular:     Rate and Rhythm: Normal rate and regular rhythm.     Pulses: Normal pulses.     Heart sounds: Normal heart sounds.  Pulmonary:     Effort: Pulmonary effort is normal.     Breath sounds: Normal breath sounds.  Chest:     Chest wall: No tenderness.  Musculoskeletal:        General: Normal range of  motion.  Skin:    General: Skin is warm and dry.     Findings: Lesion (under left breast) present. No rash.  Neurological:     General: No focal deficit present.     Mental Status: She is alert and oriented to person, place, and time. Mental status is at baseline.  Psychiatric:        Mood and Affect: Mood normal.        Behavior: Behavior normal.        Thought Content: Thought content normal.         Assessment And Plan:  Type 2 diabetes mellitus with obesity (HCC) Assessment & Plan: Last HgbA1C 6.6, will not recheck today, continue Ozempic  at 0.25mg  weekly   COVID-19 vaccination declined Assessment & Plan: Declines covid 19 vaccine. Discussed risk of covid 52 and if she changes her mind about the vaccine to call the office. Education has been provided regarding the importance of this vaccine but patient still declined. Advised may receive this vaccine at local pharmacy or Health Dept.or vaccine clinic. Aware to provide a copy of the vaccination record if obtained from local pharmacy or Health Dept.  Encouraged to take multivitamin, vitamin d , vitamin c and zinc to increase immune system. Aware can call office if would like to have vaccine here at office. Verbalized acceptance and understanding.    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  Orders: -     QuantiFERON-TB Gold Plus    No follow-ups on file.  Patient was given opportunity to ask questions. Patient verbalized understanding of the plan and was able to repeat key elements of the plan. All questions were answered to their satisfaction.   I have reviewed this encounter including the documentation in this note and/or discussed this patient with Mickael Alamo FNP Student. I am certifying that I agree with the content of this note as the primary care nurse practitioner.  [name], DNP, FNP-BC  I, [name], FNP, have reviewed all documentation  for this visit. The documentation on 09/10/23 for the exam, diagnosis, procedures, and orders are all accurate and complete.    IF YOU HAVE BEEN REFERRED TO A SPECIALIST, IT MAY TAKE 1-2  WEEKS TO SCHEDULE/PROCESS THE REFERRAL. IF YOU HAVE NOT HEARD FROM US /SPECIALIST IN TWO WEEKS, PLEASE GIVE US  A CALL AT [phone].

## 2023-09-10 NOTE — Assessment & Plan Note (Addendum)
 Last HgbA1C 6.6, will not recheck today, continue Ozempic  at 0.25mg  weekly

## 2023-09-10 NOTE — Patient Instructions (Signed)
 Apply warm compress to area under lett breast, and stop wearing under wire bras until the pimple is gone.

## 2023-09-14 LAB — QUANTIFERON-TB GOLD PLUS
QuantiFERON Mitogen Value: >10 [IU]/mL
QuantiFERON Nil Value: 0.06 [IU]/mL
QuantiFERON TB1 Ag Value: 0.07 [IU]/mL
QuantiFERON TB2 Ag Value: 0.11 [IU]/mL
QuantiFERON-TB Gold Plus: NEGATIVE

## 2023-09-15 ENCOUNTER — Encounter: Payer: Self-pay | Admitting: Nurse Practitioner

## 2023-09-22 DIAGNOSIS — Z111 Encounter for screening for respiratory tuberculosis: Secondary | ICD-10-CM | POA: Insufficient documentation

## 2023-09-22 NOTE — Assessment & Plan Note (Signed)

## 2023-09-22 NOTE — Assessment & Plan Note (Signed)
 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

## 2023-09-30 ENCOUNTER — Other Ambulatory Visit: Payer: Self-pay | Admitting: Neurosurgery

## 2023-09-30 DIAGNOSIS — I609 Nontraumatic subarachnoid hemorrhage, unspecified: Secondary | ICD-10-CM

## 2023-10-11 ENCOUNTER — Other Ambulatory Visit (HOSPITAL_BASED_OUTPATIENT_CLINIC_OR_DEPARTMENT_OTHER): Payer: Self-pay | Admitting: Cardiovascular Disease

## 2023-10-11 DIAGNOSIS — E782 Mixed hyperlipidemia: Secondary | ICD-10-CM

## 2023-10-11 DIAGNOSIS — E119 Type 2 diabetes mellitus without complications: Secondary | ICD-10-CM

## 2023-10-24 ENCOUNTER — Other Ambulatory Visit (HOSPITAL_BASED_OUTPATIENT_CLINIC_OR_DEPARTMENT_OTHER): Payer: Self-pay

## 2023-10-24 ENCOUNTER — Encounter: Payer: Self-pay | Admitting: Family

## 2023-10-27 ENCOUNTER — Other Ambulatory Visit: Payer: Self-pay | Admitting: Neurosurgery

## 2023-10-27 ENCOUNTER — Ambulatory Visit (HOSPITAL_COMMUNITY)
Admission: RE | Admit: 2023-10-27 | Discharge: 2023-10-27 | Disposition: A | Discharge status: Home / Self Care | Source: Ambulatory Visit | Attending: Neurosurgery | Admitting: Neurosurgery

## 2023-10-27 ENCOUNTER — Encounter (HOSPITAL_COMMUNITY): Payer: Self-pay | Admitting: Radiology

## 2023-10-27 ENCOUNTER — Encounter: Payer: Self-pay | Admitting: Nurse Practitioner

## 2023-10-27 ENCOUNTER — Other Ambulatory Visit: Payer: Self-pay

## 2023-10-27 DIAGNOSIS — Z8679 Personal history of other diseases of the circulatory system: Secondary | ICD-10-CM | POA: Insufficient documentation

## 2023-10-27 DIAGNOSIS — I609 Nontraumatic subarachnoid hemorrhage, unspecified: Secondary | ICD-10-CM

## 2023-10-27 DIAGNOSIS — I671 Cerebral aneurysm, nonruptured: Secondary | ICD-10-CM | POA: Diagnosis not present

## 2023-10-27 HISTORY — PX: IR ANGIO INTRA EXTRACRAN SEL INTERNAL CAROTID BILAT MOD SED: IMG5363

## 2023-10-27 HISTORY — PX: IR ANGIO VERTEBRAL SEL VERTEBRAL UNI L MOD SED: IMG5367

## 2023-10-27 LAB — BASIC METABOLIC PANEL WITH GFR
Anion gap: 11 (ref 5–15)
BUN: 14 mg/dL (ref 6–20)
CO2: 26 mmol/L (ref 22–32)
Calcium: 9.2 mg/dL (ref 8.9–10.3)
Chloride: 100 mmol/L (ref 98–111)
Creatinine, Ser: 0.9 mg/dL (ref 0.44–1.00)
GFR, Estimated: >60 mL/min (ref >60)
Glucose, Bld: 97 mg/dL (ref 70–99)
Potassium: 3.3 mmol/L — ABNORMAL LOW (ref 3.5–5.1)
Sodium: 137 mmol/L (ref 135–145)

## 2023-10-27 LAB — PROTIME-INR
INR: 1 (ref 0.8–1.2)
Prothrombin Time: 13.6 s (ref 11.4–15.2)

## 2023-10-27 LAB — GLUCOSE, CAPILLARY
Glucose-Capillary: 96 mg/dL (ref 70–99)
Glucose-Capillary: 101 mg/dL — ABNORMAL HIGH (ref 70–99)

## 2023-10-27 LAB — CBC WITH DIFFERENTIAL/PLATELET
Abs Immature Granulocytes: 0.02 10*3/uL (ref 0.00–0.07)
Basophils Absolute: 0.1 10*3/uL (ref 0.0–0.1)
Basophils Relative: 1 %
Eosinophils Absolute: 0.3 10*3/uL (ref 0.0–0.5)
Eosinophils Relative: 3 %
HCT: 40 % (ref 36.0–46.0)
Hemoglobin: 13.2 g/dL (ref 12.0–15.0)
Immature Granulocytes: 0 %
Lymphocytes Relative: 43 %
Lymphs Abs: 3.4 10*3/uL (ref 0.7–4.0)
MCH: 28 pg (ref 26.0–34.0)
MCHC: 33 g/dL (ref 30.0–36.0)
MCV: 84.7 fL (ref 80.0–100.0)
Monocytes Absolute: 0.5 10*3/uL (ref 0.1–1.0)
Monocytes Relative: 6 %
Neutro Abs: 3.8 10*3/uL (ref 1.7–7.7)
Neutrophils Relative %: 47 %
Platelets: 316 10*3/uL (ref 150–400)
RBC: 4.72 MIL/uL (ref 3.87–5.11)
RDW: 15 % (ref 11.5–15.5)
WBC: 8 10*3/uL (ref 4.0–10.5)
nRBC: 0 % (ref 0.0–0.2)

## 2023-10-27 MED ORDER — HEPARIN SODIUM (PORCINE) 1000 UNIT/ML IJ SOLN
INTRAMUSCULAR | Status: AC
  Filled 2023-10-27: qty 10

## 2023-10-27 MED ORDER — LIDOCAINE HCL 1 % IJ SOLN
INTRAMUSCULAR | Status: AC
  Filled 2023-10-27: qty 20

## 2023-10-27 MED ORDER — HYDROCODONE-ACETAMINOPHEN 5-325 MG PO TABS: 1.0000 | ORAL_TABLET | ORAL | Status: DC | PRN

## 2023-10-27 MED ORDER — FENTANYL CITRATE (PF) 100 MCG/2ML IJ SOLN
INTRAMUSCULAR | Status: AC
Start: 2023-10-27 — End: ?
  Filled 2023-10-27: qty 2

## 2023-10-27 MED ORDER — NITROGLYCERIN 1 MG/10 ML FOR IR/CATH LAB
INTRA_ARTERIAL | Status: AC
  Filled 2023-10-27: qty 10

## 2023-10-27 MED ORDER — MIDAZOLAM HCL 2 MG/2ML IJ SOLN
INTRAMUSCULAR | Status: AC
  Filled 2023-10-27: qty 2

## 2023-10-27 MED ORDER — HEPARIN SODIUM (PORCINE) 1000 UNIT/ML IJ SOLN
INTRAMUSCULAR | Status: AC | PRN
  Administered 2023-10-27: 2000 [IU] via INTRAVENOUS

## 2023-10-27 MED ORDER — VERAPAMIL HCL 2.5 MG/ML IV SOLN
INTRAVENOUS | Status: AC
  Filled 2023-10-27: qty 2

## 2023-10-27 MED ORDER — MIDAZOLAM HCL 2 MG/2ML IJ SOLN
INTRAMUSCULAR | Status: AC | PRN
  Administered 2023-10-27: 1 mg via INTRAVENOUS

## 2023-10-27 MED ORDER — LIDOCAINE HCL (PF) 1 % IJ SOLN: 10.0000 mL | Freq: Once | INTRAMUSCULAR | Status: DC

## 2023-10-27 MED ORDER — FENTANYL CITRATE (PF) 100 MCG/2ML IJ SOLN
INTRAMUSCULAR | Status: AC | PRN
  Administered 2023-10-27: 25 ug via INTRAVENOUS

## 2023-10-27 NOTE — H&P (Signed)
 Chief Complaint   Aneurysm  History of Present Illness  Amanda Henry is a 58 y.o. female presenting today for routine follow-up cerebral angiogram.  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  She has made an excellent neurologic recovery.   Past Medical History   Past Medical History:  Diagnosis Date   Allergy    Anxiety    Arthritis    Blood transfusion without reported diagnosis    Depression    DM2 (diabetes mellitus, type 2) (HCC)    Hypercalcemia 04/03/2021   Hypertension    no medications at this time   Hypokalemia 04/03/2021   Hypothyroidism    Iron  deficiency anemia    Obesity    OSA on CPAP    mild obstructive sleep apnea with an AHI of 7.7/h.REM sleep at 22/hr   Palpitations    Subarachnoid hemorrhage (HCC)    Thyroid  disease    Vitamin D  deficiency     Past Surgical History   Past Surgical History:  Procedure Laterality Date   IR ANGIO INTRA EXTRACRAN SEL COM CAROTID INNOMINATE UNI L MOD SED  03/16/2021   IR ANGIO INTRA EXTRACRAN SEL COM CAROTID INNOMINATE UNI L MOD SED  03/26/2021   IR ANGIO INTRA EXTRACRAN SEL INTERNAL CAROTID UNI R MOD SED  03/15/2021   IR ANGIO INTRA EXTRACRAN SEL INTERNAL CAROTID UNI R MOD SED  03/26/2021   IR ANGIO INTRA EXTRACRAN SEL INTERNAL CAROTID UNI R MOD SED  03/25/2021   IR ANGIO INTRA EXTRACRAN SEL INTERNAL CAROTID UNI R MOD SED  10/29/2021   IR ANGIO VERTEBRAL SEL VERTEBRAL UNI L MOD SED  03/15/2021   IR ANGIO VERTEBRAL SEL VERTEBRAL UNI L MOD SED  03/26/2021   IR ANGIOGRAM FOLLOW UP STUDY  03/26/2021   IR ANGIOGRAM FOLLOW UP STUDY  03/26/2021   IR TRANSCATH/EMBOLIZ  03/26/2021   IR US  GUIDE VASC ACCESS RIGHT  03/26/2021   IR US  GUIDE VASC ACCESS RIGHT  10/29/2021   LAPAROSCOPIC VAGINAL HYSTERECTOMY WITH SALPINGECTOMY Bilateral 10/01/2016   Procedure: LAPAROSCOPIC ASSISTED VAGINAL HYSTERECTOMY  WITH SALPINGECTOMY;  Surgeon: Rogene Claude, MD;  Location: [facility];  Service: Gynecology;  Laterality: Bilateral;   PARATHYROIDECTOMY Right 04/05/2021   Procedure: right inferioir PARATHYROIDECTOMY;  Surgeon: Oralee Billow, MD;  Location: [facility];  Service: General;  Laterality: Right;   RADIOLOGY WITH ANESTHESIA N/A 03/15/2021   Procedure: IR WITH ANESTHESIA;  Surgeon: Augusto Blonder, MD;  Location: [facility] OR;  Service: Radiology;  Laterality: N/A;   RADIOLOGY WITH ANESTHESIA N/A 03/26/2021   Procedure: IR WITH ANESTHESIA (Coiling and Embolization);  Surgeon: Augusto Blonder, MD;  Location: [facility];  Service: Radiology;  Laterality: N/A;   WISDOM TOOTH EXTRACTION      Social History   Social History   Tobacco Use   Smoking status: Never   Smokeless tobacco: Never  Substance Use Topics   Alcohol use: No   Drug use: No    Medications   Prior to Admission medications   Medication Sig Start Date End Date Taking? Authorizing Provider  ALPRAZolam  (XANAX  XR) 1 MG 24 hr tablet Take 1 mg by mouth daily as needed for sleep.   Yes [provider]  escitalopram  (LEXAPRO ) 10 MG tablet Take 1 tablet (10 mg total) by mouth daily. Patient taking differently: Take 10 mg by mouth at bedtime. 09/06/18  Yes [name], FNP  hydrochlorothiazide  (  HYDRODIURIL ) 12.5 MG tablet Take 1 tablet (12.5 mg total) by mouth daily. 10/17/21  Yes Moore, Janece, FNP  ipratropium (ATROVENT ) 0.06 % nasal spray Place 2 sprays into the nose 3 (three) times daily. 08/26/21 08/26/22 Yes [name], FNP  levothyroxine  (SYNTHROID ) 150 MCG tablet TAKE 1 TABLET(150 MCG) BY MOUTH DAILY BEFORE BREAKFAST 10/25/21  Yes [name], FNP  meclizine  (ANTIVERT ) 12.5 MG tablet Take 1 tablet (12.5 mg total) by mouth 3 (three) times daily as needed for dizziness. 04/22/21  Yes [name], FNP  metFORMIN  (GLUCOPHAGE ) 500 MG tablet Take 1 tablet (500 mg total) by mouth 2 (two) times daily with a meal. 09/08/21 09/08/22 Yes [name], FNP  metoprolol  tartrate (LOPRESSOR ) 50 MG tablet Take 1 tablet (50 mg total) by mouth daily. 08/26/21  Yes [name], FNP  olmesartan  (BENICAR ) 20 MG tablet Take 1 tablet (20 mg total) by mouth daily. 10/12/21  Yes [name], FNP  Vitamin D , Ergocalciferol , (DRISDOL ) 1.25 MG (50000 UNIT) CAPS capsule Take 1 capsule (50,000 Units total) by mouth every 7 (seven) days. 08/26/21  Yes [name], FNP  Magnesium  250 MG TABS Take 1 tablet (250 mg total) by mouth daily. Take with evening meal 10/17/21   [name], FNP    Allergies   Allergies  Allergen Reactions   Latex Rash   Penicillins Rash    Has patient had a PCN reaction causing immediate rash, facial/tongue/throat swelling, SOB or lightheadedness with hypotension: Yes Has patient had a PCN reaction causing severe rash involving mucus membranes or skin necrosis: No Has patient had a PCN reaction that required hospitalization No Has patient had a PCN reaction occurring within the last 10 years: No If all of the above answers are "NO", then may proceed with Cephalosporin use.     Review of Systems  ROS  Neurologic Exam  Awake, alert, oriented Memory and concentration grossly intact Speech fluent, appropriate CN grossly intact Motor exam: Upper Extremities Deltoid Bicep Tricep Grip  Right 5/5 5/5 5/5 5/5  Left 5/5 5/5 5/5 5/5   Lower Extremities IP Quad PF DF EHL  Right 5/5 5/5 5/5 5/5 5/5  Left 5/5 5/5 5/5 5/5 5/5   Sensation grossly intact to LT  Impression  - 58 y.o. 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  She is doing well.  Plan  -We will plan on proceeding with routine follow-up diagnostic cerebral angiogram  I have reviewed the details of the procedure as well as the expected postoperative course and recovery with the patient.  We also discussed the risks of the procedure in the office.  All her questions today were  answered and she provided informed consent to proceed.   Augusto Blonder, MD [facility]

## 2023-10-27 NOTE — Progress Notes (Signed)
Patient ambulated to BR. Right groin remains unremarkable.

## 2023-10-27 NOTE — Op Note (Signed)
 ENDOVASCULAR NEUROSURGERY OPERATIVE NOTE   PROCEDURE: Diagnostic Cerebral Angiogram   SURGEON:   Dr. Augusto Blonder, MD  HISTORY:   The patient is a 58 y.o. 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  Patient has made an excellent neurologic recovery.  She comes in today for routine long-term angiographic follow-up.  APPROACH:   The technical aspects of the procedure as well as its potential risks and benefits were reviewed with the patient. These risks included but were not limited bleeding, infection, allergic reaction, damage to organs/vital structures, stroke, non-diagnostic procedure, and the catastrophic outcomes of heart attack, coma, and death. With an understanding of these risks, informed consent was obtained and witnessed.    The patient was placed in the supine position on the angiography table and the skin of right groin prepped in the usual sterile fashion. The procedure was performed under local anesthesia (1%-solution of bicarbonate-bufferred Lidoacaine) and conscious sedation with Versed  and fentanyl  monitored by the in-suite nurse and myself, including non-invasive blood pressure and continuous pulse oxymetry.    Access to the right common femoral artery was obtained using micropuncture needle and standard Seldinger technique.  A short 5 French sheath was introduced.  Position was documented with fluoroscopy.  HEPARIN :  2000 Units total.    CONTRAST AGENT:  See IR records   FLUOROSCOPY TIME:  See IR records    CATHETER(S) AND WIRE(S):    5-French JB-1 glidecatheter   0.035" glidewire    VESSELS CATHETERIZED:   Right internal carotid   Left internal carotid   Left vertebral   Right common femoral  VESSELS STUDIED:   Right internal carotid, head Left internal carotid, head Left vertebral Right femoral  PROCEDURAL NARRATIVE:    A 5-Fr JB-1 terumo glide catheter was advanced over a 0.035 glidewire into the aortic arch. The above vessels were then sequentially catheterized and cervical/cerebral angiograms taken. After review of images, the catheter was removed without incident.    INTERPRETATION:   Right internal carotid, head:   Injection reveals the presence of a widely patent ICA, M1, and A1 segments and their branches.  Small coil mass is seen in proximity to the origin of the anterior choroidal artery.  There is no aneurysm recanalization.  Again noted is stable appearance of an infundibulum at the origin of the posterior communicating artery.  The parenchymal and venous phases are unremarkable. The venous sinuses are widely patent.    Left internal carotid, head:   Injection reveals the presence of a widely patent ICA, A1, and M1 segments and their branches. No aneurysms, arteriovenous malformations, or high flow fistulas are visualized.  Again seen is a small infundibulum at the origin of the left posterior communicating artery.  The parenchymal and venous phases are unremarkable. The venous sinuses are widely patent.    Left vertebral:   Injection reveals the presence of a widely patent vertebral artery. This leads to a widely patent basilar artery that terminates in bilateral P1. The basilar apex is normal. No aneurysms, arteriovenous malformations, or high flow fistulas are visualized.  There is contrast reflux through the distal segment of the contralateral vertebral artery.  The contralateral PICA origin is visualized.  No aneurysm is seen.  The parenchymal and venous phases are normal. The venous sinuses are patent.    Right femoral:    Normal vessel. No significant atherosclerotic disease. Arterial sheath in adequate  position.   DISPOSITION:  Upon completion of the study, the femoral sheath was removed and hemostasis obtained using a 5-Fr Exoseal closure device. Good proximal and distal lower extremity pulses  were documented upon achievement of hemostasis. The procedure was well tolerated and no early complications were observed.  The patient was transferred to the recovery area to be positioned flat in bed for 3 hours.    IMPRESSION:  1.  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 2.  No other intracranial aneurysms, arteriovenous malformations, or high flow fistulas are seen.    Augusto Blonder, MD [facility]

## 2023-11-02 ENCOUNTER — Other Ambulatory Visit (HOSPITAL_BASED_OUTPATIENT_CLINIC_OR_DEPARTMENT_OTHER): Payer: Self-pay

## 2023-11-02 MED ORDER — OXCARBAZEPINE 150 MG PO TABS
150.0000 mg | ORAL_TABLET | Freq: Every day | ORAL | 0 refills | Status: DC
  Filled 2023-11-03 (×2): qty 30, 30d supply, fill #0

## 2023-11-02 MED ORDER — ESCITALOPRAM OXALATE 10 MG PO TABS
10.0000 mg | ORAL_TABLET | Freq: Every day | ORAL | 1 refills | Status: DC
  Filled 2023-11-02 – 2023-11-03 (×2): qty 30, 30d supply, fill #0
  Filled 2023-12-04: qty 30, 30d supply, fill #1

## 2023-11-02 MED ORDER — LINACLOTIDE 290 MCG PO CAPS
290.0000 ug | ORAL_CAPSULE | Freq: Every day | ORAL | 4 refills | Status: AC
  Filled 2023-11-21 – 2024-03-06 (×2): qty 90, 90d supply, fill #0

## 2023-11-02 MED ORDER — OZEMPIC (0.25 OR 0.5 MG/DOSE) 2 MG/3ML ~~LOC~~ SOPN
0.5000 mg | PEN_INJECTOR | SUBCUTANEOUS | 2 refills | Status: DC
  Filled 2023-11-21: qty 3, 28d supply, fill #0
  Filled 2024-01-05: qty 3, 28d supply, fill #1

## 2023-11-02 MED ORDER — POLYETHYLENE GLYCOL 3350 17 G PO PACK: 17.0000 g | PACK | Freq: Every day | ORAL | 1 refills | Status: DC

## 2023-11-02 MED ORDER — HYDROCHLOROTHIAZIDE 25 MG PO TABS
25.0000 mg | ORAL_TABLET | Freq: Every day | ORAL | 3 refills | Status: DC
  Filled 2023-12-04: qty 90, 90d supply, fill #0
  Filled 2024-03-06: qty 90, 90d supply, fill #1

## 2023-11-02 MED ORDER — CHLORDIAZEPOXIDE HCL 5 MG PO CAPS
15.0000 mg | ORAL_CAPSULE | Freq: Every day | ORAL | 2 refills | Status: DC
  Filled 2023-11-03: qty 120, 30d supply, fill #0
  Filled 2023-12-04: qty 120, 30d supply, fill #1

## 2023-11-03 ENCOUNTER — Other Ambulatory Visit (HOSPITAL_BASED_OUTPATIENT_CLINIC_OR_DEPARTMENT_OTHER): Payer: Self-pay

## 2023-11-03 ENCOUNTER — Other Ambulatory Visit: Payer: Self-pay

## 2023-11-04 ENCOUNTER — Other Ambulatory Visit (HOSPITAL_BASED_OUTPATIENT_CLINIC_OR_DEPARTMENT_OTHER): Payer: Self-pay

## 2023-11-09 ENCOUNTER — Ambulatory Visit: Payer: 59 | Admitting: Nurse Practitioner

## 2023-11-17 NOTE — Progress Notes (Unsigned)
 LILLETTE Kristeen JINNY Gladis, CMA,acting as a Neurosurgeon for Gaines Ada, FNP.,have documented all relevant documentation on the behalf of Gaines Ada, FNP,as directed by  Gaines Ada, FNP while in the presence of Gaines Ada, FNP.  Subjective:  Patient ID: Amanda Henry , female    DOB: 28-Oct-1965 , 58 y.o.   MRN: [id number]  No chief complaint on file.   HPI  HPI   Past Medical History:  Diagnosis Date   Allergy    Anxiety    Arthritis    Blood transfusion without reported diagnosis    Depression    DM2 (diabetes mellitus, type 2) (HCC)    Hypercalcemia 04/03/2021   Hypertension    no medications at this time   Hypokalemia 04/03/2021   Hypothyroidism    Iron  deficiency anemia    Obesity    OSA on CPAP    mild obstructive sleep apnea with an AHI of 7.7/h.REM sleep at 22/hr   Palpitations    Subarachnoid hemorrhage (HCC)    Thyroid  disease    Vitamin D  deficiency      Family History  Problem Relation Age of Onset   Thyroid  disease Mother    Hypertension Mother    Diabetes Father    Hearing loss Father    Hypertension Other    Diabetes Other    Thyroid  disease Other    Heart disease Other    ADD / ADHD Son      Current Outpatient Medications:    ALPRAZolam  (XANAX  XR) 1 MG 24 hr tablet, Take 0.5 mg by mouth daily as needed for sleep., Disp: , Rfl:    amLODipine -olmesartan  (AZOR ) 10-40 MG tablet, Take 1 tablet by mouth daily., Disp: 90 tablet, Rfl: 1   atorvastatin  (LIPITOR) 20 MG tablet, Take 1 tablet (20 mg total) by mouth daily., Disp: 90 tablet, Rfl: 3   Blood Glucose Monitoring Suppl (ACCU-CHEK GUIDE ME) w/Device KIT, USE AS DIRECTED FOUR TIMES DAILY, Disp: 1 kit, Rfl: 1   chlordiazePOXIDE  (LIBRIUM ) 5 MG capsule, Take 3-4 capsules (15-20 mg total) by mouth at bedtime., Disp: 120 capsule, Rfl: 2   doxycycline  (MONODOX ) 100 MG capsule, Take 1 capsule (100 mg total) by mouth 2 (two) times daily., Disp: 14 capsule, Rfl: 0   escitalopram  (LEXAPRO ) 10 MG tablet, Take 1 tablet  (10 mg total) by mouth daily., Disp: 30 tablet, Rfl: 1   escitalopram  (LEXAPRO ) 10 MG tablet, Take 1 tablet (10 mg total) by mouth daily., Disp: 30 tablet, Rfl: 1   fexofenadine  (ALLEGRA  ALLERGY) 180 MG tablet, Take 1 tablet (180 mg total) by mouth daily., Disp: 90 tablet, Rfl: 1   Glucagon  (GVOKE HYPOPEN  2-PACK) 0.5 MG/0.1ML SOAJ, Inject 0.1 mg into the skin as needed., Disp: 0.1 mL, Rfl: 6   glucose blood (ACCU-CHEK GUIDE TEST) test strip, Use every day as directed., Disp: 100 strip, Rfl: 0   hydrochlorothiazide  (HYDRODIURIL ) 25 MG tablet, Take 0.5 tablets (12.5 mg total) by mouth daily., Disp: , Rfl:    hydrochlorothiazide  (HYDRODIURIL ) 25 MG tablet, Take 1 tablet (25 mg total) by mouth daily., Disp: 90 tablet, Rfl: 3   levothyroxine  (SYNTHROID ) 125 MCG tablet, Take 1 tablet (125 mcg total) by mouth daily., Disp: 90 tablet, Rfl: 3   linaclotide (LINZESS) 290 MCG CAPS capsule, Take 1 capsule (290 mcg total) by mouth daily 30 minutes before breakfast., Disp: 90 capsule, Rfl: 4   MAGNESIUM  GLYCINATE PO, Take 500 mg by mouth., Disp: , Rfl:    meclizine  (ANTIVERT ) 12.5 MG tablet,  Take 1 tablet (12.5 mg total) by mouth 3 (three) times daily as needed for dizziness., Disp: 30 tablet, Rfl: 1   metoprolol  tartrate (LOPRESSOR ) 25 MG tablet, Take 0.5 tablets (12.5 mg total) by mouth every 12 (twelve) hours as needed (palpitations)., Disp: 15 tablet, Rfl: 1   mometasone  (NASONEX ) 50 MCG/ACT nasal spray, Place 2 sprays into the nose daily., Disp: 1 each, Rfl: 2   OXcarbazepine  (TRILEPTAL ) 150 MG tablet, Take 150 mg by mouth at bedtime., Disp: , Rfl:    OXcarbazepine  (TRILEPTAL ) 150 MG tablet, Take 1 tablet (150 mg total) by mouth at bedtime., Disp: 30 tablet, Rfl: 0   polyethylene glycol (MIRALAX  / GLYCOLAX ) 17 g packet, Take 17 g by mouth daily., Disp: 90 packet, Rfl: 1   Semaglutide ,0.25 or 0.5MG /DOS, (OZEMPIC , 0.25 OR 0.5 MG/DOSE,) 2 MG/3ML SOPN, Inject 0.5 mg into the skin once a week., Disp: 3 mL, Rfl:  2   Semaglutide ,0.25 or 0.5MG /DOS, 2 MG/3ML SOPN, Inject 0.5 mg into the skin once a week., Disp: 3 mL, Rfl: 2   Vitamin D , Ergocalciferol , (DRISDOL ) 1.25 MG (50000 UNIT) CAPS capsule, Take 1 capsule (50,000 Units total) by mouth every 7 (seven) days., Disp: 15 capsule, Rfl: 3   Allergies  Allergen Reactions   Latex Rash   Penicillins Rash    Has patient had a PCN reaction causing immediate rash, facial/tongue/throat swelling, SOB or lightheadedness with hypotension: Yes Has patient had a PCN reaction causing severe rash involving mucus membranes or skin necrosis: No Has patient had a PCN reaction that required hospitalization No Has patient had a PCN reaction occurring within the last 10 years: No If all of the above answers are NO, then may proceed with Cephalosporin use.      Review of Systems   There were no vitals filed for this visit. There is no height or weight on file to calculate BMI.  Wt Readings from Last 3 Encounters:  10/27/23 200 lb (90.7 kg)  09/10/23 201 lb (91.2 kg)  06/29/23 201 lb (91.2 kg)    The 10-year ASCVD risk score (Arnett DK, et al., 2019) is: 6.4%   Values used to calculate the score:     Age: 70 years     Clincally relevant sex: Female     Is Non-Hispanic African American: Yes     Diabetic: Yes     Tobacco smoker: No     Systolic Blood Pressure: 107 mmHg     Is BP treated: Yes     HDL Cholesterol: 46 mg/dL     Total Cholesterol: 157 mg/dL  Objective:  Physical Exam      Assessment And Plan:  There are no diagnoses linked to this encounter.  No follow-ups on file.  Patient was given opportunity to ask questions. Patient verbalized understanding of the plan and was able to repeat key elements of the plan. All questions were answered to their satisfaction.    LILLETTE Gaines Ada, FNP, have reviewed all documentation for this visit. The documentation on 11/17/23 for the exam, diagnosis, procedures, and orders are all accurate and complete.    IF YOU HAVE BEEN REFERRED TO A SPECIALIST, IT MAY TAKE 1-2 WEEKS TO SCHEDULE/PROCESS THE REFERRAL. IF YOU HAVE NOT HEARD FROM US /SPECIALIST IN TWO WEEKS, PLEASE GIVE US  A CALL AT [phone].

## 2023-11-18 ENCOUNTER — Encounter: Payer: Self-pay | Admitting: Nurse Practitioner

## 2023-11-21 MED FILL — Glucose Blood Test Strip: 50 days supply | Qty: 50 | Fill #0 | Status: CN

## 2023-11-21 MED FILL — Amlodipine Besylate-Olmesartan Medoxomil Tab 10-40 MG: ORAL | 90 days supply | Qty: 90 | Fill #0 | Status: AC

## 2023-11-23 ENCOUNTER — Other Ambulatory Visit (HOSPITAL_BASED_OUTPATIENT_CLINIC_OR_DEPARTMENT_OTHER): Payer: Self-pay

## 2023-11-29 NOTE — Progress Notes (Signed)
 Not seen, appt in error

## 2023-12-02 ENCOUNTER — Encounter (HOSPITAL_BASED_OUTPATIENT_CLINIC_OR_DEPARTMENT_OTHER): Payer: Self-pay | Admitting: Cardiovascular Disease

## 2023-12-04 ENCOUNTER — Other Ambulatory Visit (HOSPITAL_BASED_OUTPATIENT_CLINIC_OR_DEPARTMENT_OTHER): Payer: Self-pay

## 2023-12-04 ENCOUNTER — Other Ambulatory Visit: Payer: Self-pay

## 2023-12-04 MED FILL — Glucose Blood Test Strip: 50 days supply | Qty: 50 | Fill #0 | Status: CN

## 2023-12-05 ENCOUNTER — Other Ambulatory Visit (HOSPITAL_BASED_OUTPATIENT_CLINIC_OR_DEPARTMENT_OTHER): Payer: Self-pay

## 2023-12-07 ENCOUNTER — Other Ambulatory Visit: Payer: Self-pay

## 2023-12-07 ENCOUNTER — Other Ambulatory Visit (HOSPITAL_BASED_OUTPATIENT_CLINIC_OR_DEPARTMENT_OTHER): Payer: Self-pay

## 2023-12-09 ENCOUNTER — Other Ambulatory Visit (HOSPITAL_COMMUNITY): Payer: Self-pay

## 2023-12-09 ENCOUNTER — Other Ambulatory Visit (HOSPITAL_BASED_OUTPATIENT_CLINIC_OR_DEPARTMENT_OTHER): Payer: Self-pay

## 2023-12-09 MED ORDER — OXCARBAZEPINE 150 MG PO TABS
450.0000 mg | ORAL_TABLET | Freq: Every day | ORAL | 1 refills | Status: DC
  Filled 2023-12-09: qty 90, 30d supply, fill #0
  Filled 2024-01-05: qty 90, 30d supply, fill #1

## 2024-01-05 ENCOUNTER — Other Ambulatory Visit (HOSPITAL_BASED_OUTPATIENT_CLINIC_OR_DEPARTMENT_OTHER): Payer: Self-pay

## 2024-01-06 ENCOUNTER — Other Ambulatory Visit: Payer: Self-pay

## 2024-01-06 ENCOUNTER — Other Ambulatory Visit (HOSPITAL_BASED_OUTPATIENT_CLINIC_OR_DEPARTMENT_OTHER): Payer: Self-pay

## 2024-01-06 MED ORDER — ESCITALOPRAM OXALATE 10 MG PO TABS
10.0000 mg | ORAL_TABLET | Freq: Every day | ORAL | 0 refills | Status: DC
  Filled 2024-01-06: qty 30, 30d supply, fill #0

## 2024-01-08 ENCOUNTER — Other Ambulatory Visit (HOSPITAL_BASED_OUTPATIENT_CLINIC_OR_DEPARTMENT_OTHER): Payer: Self-pay

## 2024-01-08 MED FILL — Atorvastatin Calcium Tab 20 MG (Base Equivalent): ORAL | 90 days supply | Qty: 90 | Fill #0 | Status: AC

## 2024-01-30 ENCOUNTER — Other Ambulatory Visit (HOSPITAL_BASED_OUTPATIENT_CLINIC_OR_DEPARTMENT_OTHER): Payer: Self-pay

## 2024-02-01 ENCOUNTER — Other Ambulatory Visit (HOSPITAL_BASED_OUTPATIENT_CLINIC_OR_DEPARTMENT_OTHER): Payer: Self-pay

## 2024-02-01 MED ORDER — OXCARBAZEPINE 150 MG PO TABS
450.0000 mg | ORAL_TABLET | Freq: Every day | ORAL | 5 refills | Status: DC
  Filled 2024-02-01: qty 90, 30d supply, fill #0

## 2024-02-01 MED ORDER — ESCITALOPRAM OXALATE 10 MG PO TABS
10.0000 mg | ORAL_TABLET | Freq: Every day | ORAL | 5 refills | Status: AC
  Filled 2024-02-01: qty 30, 30d supply, fill #0
  Filled 2024-03-06: qty 30, 30d supply, fill #1
  Filled 2024-04-10: qty 30, 30d supply, fill #2
  Filled 2024-05-08: qty 30, 30d supply, fill #3
  Filled 2024-06-07: qty 30, 30d supply, fill #4

## 2024-02-01 MED ORDER — OXCARBAZEPINE 150 MG PO TABS
450.0000 mg | ORAL_TABLET | Freq: Every day | ORAL | 5 refills | Status: AC
  Filled 2024-02-01 – 2024-02-25 (×2): qty 90, 30d supply, fill #0
  Filled 2024-03-11 – 2024-03-30 (×2): qty 90, 30d supply, fill #1
  Filled 2024-04-10 – 2024-05-08 (×2): qty 90, 30d supply, fill #2
  Filled 2024-06-07: qty 90, 30d supply, fill #3

## 2024-02-01 MED ORDER — CHLORDIAZEPOXIDE HCL 5 MG PO CAPS
15.0000 mg | ORAL_CAPSULE | Freq: Every day | ORAL | 2 refills | Status: DC
  Filled 2024-02-01: qty 60, 15d supply, fill #0
  Filled 2024-02-01: qty 120, 30d supply, fill #0
  Filled 2024-02-12 – 2024-02-15 (×2): qty 60, 15d supply, fill #1
  Filled 2024-03-06: qty 60, 15d supply, fill #2
  Filled 2024-03-31: qty 60, 15d supply, fill #3
  Filled 2024-04-15: qty 60, 15d supply, fill #4
  Filled 2024-05-14: qty 60, 15d supply, fill #5

## 2024-02-08 ENCOUNTER — Encounter: Payer: Self-pay | Admitting: Nurse Practitioner

## 2024-02-12 ENCOUNTER — Other Ambulatory Visit (HOSPITAL_BASED_OUTPATIENT_CLINIC_OR_DEPARTMENT_OTHER): Payer: Self-pay

## 2024-02-12 ENCOUNTER — Other Ambulatory Visit: Payer: Self-pay

## 2024-02-12 ENCOUNTER — Other Ambulatory Visit: Payer: Self-pay | Admitting: Nurse Practitioner

## 2024-02-12 ENCOUNTER — Other Ambulatory Visit (HOSPITAL_BASED_OUTPATIENT_CLINIC_OR_DEPARTMENT_OTHER): Payer: Self-pay | Admitting: Family

## 2024-02-12 DIAGNOSIS — I10 Essential (primary) hypertension: Secondary | ICD-10-CM

## 2024-02-12 MED ORDER — AMLODIPINE-OLMESARTAN 10-40 MG PO TABS
1.0000 | ORAL_TABLET | Freq: Every day | ORAL | 1 refills | Status: AC
  Filled 2024-02-12 – 2024-02-15 (×2): qty 90, 90d supply, fill #0
  Filled 2024-05-21: qty 90, 90d supply, fill #1

## 2024-02-15 ENCOUNTER — Other Ambulatory Visit: Payer: Self-pay

## 2024-02-15 ENCOUNTER — Other Ambulatory Visit (HOSPITAL_BASED_OUTPATIENT_CLINIC_OR_DEPARTMENT_OTHER): Payer: Self-pay

## 2024-02-15 ENCOUNTER — Telehealth: Payer: Self-pay | Admitting: Nurse Practitioner

## 2024-02-15 ENCOUNTER — Encounter: Payer: Self-pay | Admitting: Family

## 2024-02-15 DIAGNOSIS — E1169 Type 2 diabetes mellitus with other specified complication: Secondary | ICD-10-CM

## 2024-02-15 MED ORDER — SEMAGLUTIDE(0.25 OR 0.5MG/DOS) 2 MG/3ML ~~LOC~~ SOPN
0.5000 mg | PEN_INJECTOR | SUBCUTANEOUS | 2 refills | Status: DC
  Filled 2024-02-15: qty 3, 28d supply, fill #0

## 2024-02-15 MED FILL — Semaglutide Soln Pen-inj 0.25 or 0.5 MG/DOSE (2 MG/3ML): SUBCUTANEOUS | 28 days supply | Qty: 3 | Fill #0 | Status: CN

## 2024-02-15 NOTE — Telephone Encounter (Unsigned)
 Copied from CRM #[id number]. Topic: Clinical - Medication Refill >> Feb 15, 2024 10:35 AM Amy B wrote: Medication: Semaglutide ,0.25 or 0.5MG /DOS, (OZEMPIC , 0.25 OR 0.5 MG/DOSE,) 2 MG/3ML SOPN  Has the patient contacted their pharmacy? Yes (Agent: If no, request that the patient contact the pharmacy for the refill. If patient does not wish to contact the pharmacy document the reason why and proceed with request.) (Agent: If yes, when and what did the pharmacy advise?)  This is the patient's preferred pharmacy:  [facility] [address] [place] [address] Phone: [phone] Fax: [phone]  Is this the correct pharmacy for this prescription? Yes If no, delete pharmacy and type the correct one.   Has the prescription been filled recently? No  Is the patient out of the medication? Yes  Has the patient been seen for an appointment in the last year OR does the patient have an upcoming appointment? Yes  Can we respond through MyChart? Yes  Agent: Please be advised that Rx refills may take up to 3 business days. We ask that you follow-up with your pharmacy.

## 2024-02-16 ENCOUNTER — Other Ambulatory Visit: Payer: Self-pay

## 2024-02-18 ENCOUNTER — Telehealth: Payer: Self-pay

## 2024-02-18 NOTE — Telephone Encounter (Signed)
 PA for ozempic sent to plan.

## 2024-02-25 ENCOUNTER — Other Ambulatory Visit (HOSPITAL_BASED_OUTPATIENT_CLINIC_OR_DEPARTMENT_OTHER): Payer: Self-pay

## 2024-02-25 ENCOUNTER — Other Ambulatory Visit: Payer: Self-pay

## 2024-02-25 ENCOUNTER — Encounter: Payer: Self-pay | Admitting: Internal Medicine

## 2024-02-25 ENCOUNTER — Ambulatory Visit: Payer: BC Managed Care – PPO | Admitting: Internal Medicine

## 2024-02-25 ENCOUNTER — Other Ambulatory Visit

## 2024-02-25 VITALS — BP 124/70 | HR 74 | Ht 67.0 in | Wt 206.4 lb

## 2024-02-25 DIAGNOSIS — E039 Hypothyroidism, unspecified: Secondary | ICD-10-CM | POA: Diagnosis not present

## 2024-02-25 DIAGNOSIS — Z8639 Personal history of other endocrine, nutritional and metabolic disease: Secondary | ICD-10-CM | POA: Diagnosis not present

## 2024-02-25 DIAGNOSIS — E559 Vitamin D deficiency, unspecified: Secondary | ICD-10-CM

## 2024-02-25 NOTE — Patient Instructions (Addendum)
 Please continue: - Levothyroxine  125 mcg daily.  Take the thyroid  hormone every day, with water, at least 30 minutes before breakfast, separated by at least 4 hours from: - acid reflux medications - calcium  - iron  - multivitamins  Continue Ergocalciferol  50,0000 units weekly.  Take 1 Tums tablet as needed.  Look up Refresh Celluvisc eye drops.  Please come back in 1 year.

## 2024-02-25 NOTE — Progress Notes (Signed)
 Patient ID: Amanda Henry, female   DOB: 1965-12-08, 58 y.o.   MRN: [id number]   HPI  Amanda Henry is a 58 y.o.-year-old female, initially referred referred by her PCP, Amanda Speaks, FNP, returning for f/u for hypercalcemia/hyperparathyroidism and also hypothyroidism. Last OV 1 year ago.  Interim history: At today's visit, she feels well, without complaints. She has eye irritation >> saw her ophthalmologist few months ago and was advised to use an eye gel at night.  The condition initially resolved but it appeared again.  Pt was dx with hypercalcemia in 2018 and hyperparathyroidism in 05/2019.   She described being very tired, having a whooshing sound in her head, tremor, fell out of bed  - lost consciousness, had urine loss and headache  - taken to the hospital >> found to have high blood pressure and a subarachnoid hemorrhage - this was coiled. While in the hospital, calcium  was undetectably high (>15).    A parathyroid  adenoma was resected in 03/2021.  Surprisingly, final pathology was benign.  After surgery, she had  tingling throughout her body, but this resolved.  I reviewed pt's pertinent labs: Ionized calcium : Lab Results  Component Value Date   CAION 4.9 02/25/2023   CAION 5.40 04/10/2021   CAION 1.83 (HH) 03/15/2021   Lab Results  Component Value Date   PTH 26 07/12/2021   PTH Comment 07/12/2021   PTH 311 (H) 04/03/2021   PTH Comment 04/03/2021   PTH 163 (H) 11/28/2020   PTH 163 (H) 06/14/2019   CALCIUM  9.2 10/27/2023   CALCIUM  9.6 06/15/2023   CALCIUM  9.0 02/26/2023   CALCIUM  9.0 11/05/2022   CALCIUM  9.4 07/03/2022   CALCIUM  9.6 06/12/2022   CALCIUM  9.4 02/19/2022   CALCIUM  9.4 12/04/2021   CALCIUM  8.7 (L) 10/29/2021   CALCIUM  9.8 10/12/2021   Lab Results  Component Value Date   PHOS 2.7 04/10/2021   PHOS 1.8 (L) 04/07/2021   PHOS <1.0 (LL) 04/05/2021   PHOS 1.7 (L) 04/03/2021   PHOS 3.0 04/01/2021   PHOS 1.6 (L) 11/28/2020   Component     Latest  Ref Rng & Units 11/28/2020  Calcium      8.6 - 10.4 mg/dL 85.5 (HH)  Vitamin D  1, 25 (OH) Total     18 - 72 pg/mL 127 (H)  Vitamin D3 1, 25 (OH)     pg/mL 19  Vitamin D2 1, 25 (OH)     pg/mL 108  Vitamin D , 25-Hydroxy     30.0 - 100.0 ng/mL 32.6  PTH, Intact     15 - 65 pg/mL 163 (H)  Phosphorus     2.3 - 4.6 mg/dL 1.6 (L)  Magnesium      1.5 - 2.5 mg/dL 2.0   Component     Latest Ref Rng & Units 12/03/2020  Creatinine, 24H Ur     0.50 - 2.15 g/24 h 1.43  Calcium , 24H Urine     mg/24 h 390 (H)  FECa 0.216%  04/04/2021: Technetium sestamibi scan: There is a focus of persistent/delayed uptake in the lower right thyroid  lobe consistent with a parathyroid  adenoma. This appears to correlate with the CT and ultrasound findings of a 2 cm nodule in this area.  She had right inferior parathyroidectomy by Dr. Eletha on 04/06/2021: A. PARATHYROID , RIGHT INFERIOR, ADENOMA, PARATHYROIDECTOMY:  - Parathyroid  adenoma, 4.36 g.  See comment  - Scant thyroid  tissue with lymphocytic thyroiditis - no evidence of  malignancy   The parathyroid  adenoma shows a few thick  fibrous bands which is one of  the features of an atypical adenoma but it does not show other atypical  features such as necrosis, nuclear atypia or increased mitotic activity.  Dr. Belvie reviewed the case and concurs with the diagnosis.  Clinical  correlation is suggested   No h/o osteoporosis.  No DXA scans available for review. No fractures or falls.  No h/o kidney stones.  No h/o CKD. Last BUN/Cr: Lab Results  Component Value Date   BUN 14 10/27/2023   BUN 13 06/15/2023   CREATININE 0.90 10/27/2023   CREATININE 0.92 06/15/2023   Of note, her alkaline phosphatase was elevated once, but at that time she also had a low vitamin D : Lab Results  Component Value Date   ALKPHOS 101 02/26/2023   ALKPHOS 107 02/19/2022   ALKPHOS 75 12/04/2021   ALKPHOS 91 10/12/2021   ALKPHOS 108 04/08/2021   ALKPHOS 97 04/07/2021    ALKPHOS 109 04/06/2021   ALKPHOS 118 04/03/2021   ALKPHOS 186 (H) 05/31/2019   ALKPHOS 79 09/10/2016   SPEP was normal: Component     Latest Ref Rng & Units 05/31/2019 06/14/2019  Total Protein     6.0 - 8.5 g/dL 7.3 7.3  Albumin ELP     2.9 - 4.4 g/dL 4.0 3.9  Alpha 1     0.0 - 0.4 g/dL 0.2 0.2  Alpha 2     0.4 - 1.0 g/dL 0.6 0.7  Beta     0.7 - 1.3 g/dL 1.0 1.1  Gamma Globulin     0.4 - 1.8 g/dL 1.5 1.5  M-SPIKE, %     Not Observed g/dL Not Observed Not Observed  Globulin, Total     2.2 - 3.9 g/dL 3.3 3.4  A/G Ratio     0.7 - 1.7 1.2 1.1   Pt is on HCTZ.  Vitamin D  deficiency:  Reviewed vit D levels: Lab Results  Component Value Date   VD25OH 55.88 02/25/2023   VD25OH 44.8 07/07/2022   VD25OH 26.81 (L) 07/12/2021   VD25OH 113.24 (H) 04/03/2021   VD25OH 32.6 11/28/2020   VD25OH 24.6 (L) 10/19/2020   VD25OH 17.2 (L) 09/25/2020   VD25OH 28.3 (L) 02/14/2020   VD25OH 23.3 (L) 11/28/2019   VD25OH 12.4 (L) 05/31/2019   Pt is on ergocalciferol  50,000 units once a week, then stopped in 03/2021, then restarted.    Pt does not have a FH of hypercalcemia, pituitary tumors, thyroid  cancer, or osteoporosis.   Hypothyroidism:  - s/p RAI tx for Graves 1991  She continues on levothyroxine  125 mcg daily (decreased from 137 mcg daily in 12/2021): - fasting - at least 30 min from b'fast - prev. Ca 200 mg 2x a day  >> Tums tablet in am >> off now (forgot) - no Fe - no MVI - no PPIs  - stopped Biotin  Reviewed her TFTs : Lab Results  Component Value Date   TSH 1.010 06/29/2023   TSH 2.94 01/15/2023   TSH 2.140 11/05/2022   TSH 1.210 07/07/2022   TSH 0.842 02/19/2022   TSH 0.27 (L) 12/25/2021   TSH 0.350 12/04/2021   TSH 0.225 (L) 11/05/2021   TSH 0.86 07/12/2021   TSH 0.403 04/04/2021   TSH 2.090 10/19/2020   TSH 14.800 (H) 09/25/2020   TSH 0.33 (L) 02/14/2020   TSH 0.219 (L) 11/28/2019   TSH 0.622 05/31/2019   TSH 1.900 10/21/2018   TSH 6.510 (H) 09/07/2018    TSH 25.010 (H) 06/03/2018  Lab Results  Component Value Date   FREET4 1.42 06/29/2023   FREET4 1.15 01/15/2023   FREET4 1.34 11/05/2022   FREET4 1.39 07/07/2022   FREET4 1.29 02/19/2022   FREET4 1.57 12/25/2021   FREET4 0.98 07/12/2021   FREET4 1.38 10/19/2020   FREET4 1.12 02/14/2020   FREET4 1.17 09/07/2018   Lab Results  Component Value Date   T3FREE 3.1 11/05/2021   T3FREE 1.4 (L) 09/25/2020   T3FREE 2.7 11/28/2019   T3FREE 2.7 05/31/2019   T3FREE 2.6 10/21/2018   Pt denies: - feeling nodules in neck - hoarseness - dysphagia - choking  She has family history of thyroid  disease in M and M uncles.  No Fh of thyroid  cancer. + h/o radiation tx to head or neck - in 1991 (RAI for Graves). No herbal supplements. No recent steroids use.   She also has a history of DM2- managed by PCP. -No complications  She is on: - Ozempic  0.5 mg weekly -tolerating well  Reviewed her HbA1c levels: Lab Results  Component Value Date   HGBA1C 6.6 (H) 06/29/2023   HGBA1C 7.4 (H) 02/26/2023   HGBA1C 6.6 (H) 11/05/2022   HGBA1C 6.6 (H) 07/07/2022   HGBA1C 6.5 (H) 02/19/2022   HGBA1C 7.5 (H) 11/05/2021   HGBA1C 7.8 (H) 08/26/2021   HGBA1C 5.9 (H) 05/31/2019   HGBA1C 6.3 (H) 09/07/2018   HGBA1C 6.3 (H) 06/03/2018   Lab Results  Component Value Date   MICRALBCREAT <9 06/29/2023   MICRALBCREAT 5 02/26/2023   MICRALBCREAT <15 02/19/2022   MICRALBCREAT <6 11/05/2021   ROS: + See HPI  I reviewed pt's medications, allergies, PMH, social hx, family hx, and changes were documented in the history of present illness. Otherwise, unchanged from my initial visit note.  Past Medical History:  Diagnosis Date   Allergy    Anxiety    Arthritis    Blood transfusion without reported diagnosis    Depression    DM2 (diabetes mellitus, type 2) (HCC)    Hypercalcemia 04/03/2021   Hypertension    no medications at this time   Hypokalemia 04/03/2021   Hypothyroidism    Iron  deficiency  anemia    Obesity    OSA on CPAP    mild obstructive sleep apnea with an AHI of 7.7/h.REM sleep at 22/hr   Palpitations    Subarachnoid hemorrhage (HCC)    Thyroid  disease    Vitamin D  deficiency    Past Surgical History:  Procedure Laterality Date   ABDOMINAL HYSTERECTOMY     Partial   BRAIN SURGERY     Aneurysm   IR ANGIO INTRA EXTRACRAN SEL COM CAROTID INNOMINATE UNI L MOD SED  03/16/2021   IR ANGIO INTRA EXTRACRAN SEL COM CAROTID INNOMINATE UNI L MOD SED  03/26/2021   IR ANGIO INTRA EXTRACRAN SEL INTERNAL CAROTID BILAT MOD SED  10/27/2023   IR ANGIO INTRA EXTRACRAN SEL INTERNAL CAROTID UNI R MOD SED  03/15/2021   IR ANGIO INTRA EXTRACRAN SEL INTERNAL CAROTID UNI R MOD SED  03/26/2021   IR ANGIO INTRA EXTRACRAN SEL INTERNAL CAROTID UNI R MOD SED  03/25/2021   IR ANGIO INTRA EXTRACRAN SEL INTERNAL CAROTID UNI R MOD SED  10/29/2021   IR ANGIO VERTEBRAL SEL VERTEBRAL UNI L MOD SED  03/15/2021   IR ANGIO VERTEBRAL SEL VERTEBRAL UNI L MOD SED  03/26/2021   IR ANGIO VERTEBRAL SEL VERTEBRAL UNI L MOD SED  10/27/2023   IR ANGIOGRAM FOLLOW UP STUDY  03/26/2021  IR ANGIOGRAM FOLLOW UP STUDY  03/26/2021   IR TRANSCATH/EMBOLIZ  03/26/2021   IR US  GUIDE VASC ACCESS RIGHT  03/26/2021   IR US  GUIDE VASC ACCESS RIGHT  10/29/2021   LAPAROSCOPIC VAGINAL HYSTERECTOMY WITH SALPINGECTOMY Bilateral 10/01/2016   Procedure: LAPAROSCOPIC ASSISTED VAGINAL HYSTERECTOMY WITH SALPINGECTOMY;  Surgeon: Estelle Service, MD;  Location: [facility];  Service: Gynecology;  Laterality: Bilateral;   PARATHYROIDECTOMY Right 04/05/2021   Procedure: right inferioir PARATHYROIDECTOMY;  Surgeon: Eletha Boas, MD;  Location: [facility];  Service: General;  Laterality: Right;   RADIOLOGY WITH ANESTHESIA N/A 03/15/2021   Procedure: IR WITH ANESTHESIA;  Surgeon: Lanis Pupa, MD;  Location: [facility] OR;  Service: Radiology;  Laterality: N/A;   RADIOLOGY WITH ANESTHESIA N/A 03/26/2021   Procedure: IR WITH ANESTHESIA (Coiling and  Embolization);  Surgeon: Lanis Pupa, MD;  Location: [facility];  Service: Radiology;  Laterality: N/A;   WISDOM TOOTH EXTRACTION     Social History   Socioeconomic History   Marital status: Single    Spouse name: Not on file   Number of children: 1   Years of education: Not on file   Highest education level: Bachelor's degree (e.g., BA, AB, BS)  Occupational History   Occupation: Biomedical engineer  Tobacco Use   Smoking status: Never   Smokeless tobacco: Never  Substance and Sexual Activity   Alcohol use: No   Drug use: No   Sexual activity: Not Currently    Birth control/protection: Condom  Other Topics Concern   Not on file  Social History Narrative   Not on file   Social Drivers of Health   Financial Resource Strain: Medium Risk (11/17/2023)   Overall Financial Resource Strain (CARDIA)    Difficulty of Paying Living Expenses: Somewhat hard  Food Insecurity: No Food Insecurity (11/17/2023)   Hunger Vital Sign    Worried About Running Out of Food in the Last Year: Never true    Ran Out of Food in the Last Year: Never true  Transportation Needs: No Transportation Needs (11/17/2023)   PRAPARE - Administrator, Civil Service (Medical): No    Lack of Transportation (Non-Medical): No  Physical Activity: Insufficiently Active (11/17/2023)   Exercise Vital Sign    Days of Exercise per Week: 2 days    Minutes of Exercise per Session: 30 min  Stress: Stress Concern Present (11/17/2023)   [employer] of Occupational Health - Occupational Stress Questionnaire    Feeling of Stress: To some extent  Social Connections: Moderately Integrated (11/17/2023)   Social Connection and Isolation Panel    Frequency of Communication with Friends and Family: More than three times a week    Frequency of Social Gatherings with Friends and Family: Twice a week    Attends Religious Services: More than 4 times per year    Active Member of [employer] or Organizations: Yes    Attends  Engineer, structural: More than 4 times per year    Marital Status: Never married  Intimate Partner Violence: Not on file   Current Outpatient Medications on File Prior to Visit  Medication Sig Dispense Refill   ALPRAZolam  (XANAX  XR) 1 MG 24 hr tablet Take 0.5 mg by mouth daily as needed for sleep.     amLODipine -olmesartan  (AZOR ) 10-40 MG tablet Take 1 tablet by mouth daily. 90 tablet 1   atorvastatin  (LIPITOR) 20 MG tablet Take 1 tablet (20 mg total) by mouth daily. 90 tablet 3   Blood Glucose Monitoring Suppl (  ACCU-CHEK GUIDE ME) w/Device KIT USE AS DIRECTED FOUR TIMES DAILY 1 kit 1   chlordiazePOXIDE  (LIBRIUM ) 5 MG capsule Take 3-4 capsules (15-20 mg total) by mouth at bedtime. 120 capsule 2   doxycycline  (MONODOX ) 100 MG capsule Take 1 capsule (100 mg total) by mouth 2 (two) times daily. 14 capsule 0   escitalopram  (LEXAPRO ) 10 MG tablet Take 1 tablet (10 mg total) by mouth daily. 30 tablet 1   escitalopram  (LEXAPRO ) 10 MG tablet Take 1 tablet (10 mg total) by mouth daily. 30 tablet 5   fexofenadine  (ALLEGRA  ALLERGY) 180 MG tablet Take 1 tablet (180 mg total) by mouth daily. 90 tablet 1   Glucagon  (GVOKE HYPOPEN  2-PACK) 0.5 MG/0.1ML SOAJ Inject 0.1 mg into the skin as needed. 0.1 mL 6   glucose blood (ACCU-CHEK GUIDE TEST) test strip Use every day as directed. 50 strip 0   hydrochlorothiazide  (HYDRODIURIL ) 25 MG tablet Take 0.5 tablets (12.5 mg total) by mouth daily.     hydrochlorothiazide  (HYDRODIURIL ) 25 MG tablet Take 1 tablet (25 mg total) by mouth daily. 90 tablet 3   levothyroxine  (SYNTHROID ) 125 MCG tablet Take 1 tablet (125 mcg total) by mouth daily. 90 tablet 3   linaclotide  (LINZESS ) 290 MCG CAPS capsule Take 1 capsule (290 mcg total) by mouth daily 30 minutes before breakfast. 90 capsule 4   MAGNESIUM  GLYCINATE PO Take 500 mg by mouth.     meclizine  (ANTIVERT ) 12.5 MG tablet Take 1 tablet (12.5 mg total) by mouth 3 (three) times daily as needed for dizziness. 30 tablet  1   metoprolol  tartrate (LOPRESSOR ) 25 MG tablet Take 0.5 tablets (12.5 mg total) by mouth every 12 (twelve) hours as needed (palpitations). 15 tablet 1   mometasone  (NASONEX ) 50 MCG/ACT nasal spray Place 2 sprays into the nose daily. 1 each 2   OXcarbazepine  (TRILEPTAL ) 150 MG tablet Take 150 mg by mouth at bedtime.     OXcarbazepine  (TRILEPTAL ) 150 MG tablet Take 3 tablets (450 mg total) by mouth at bedtime. 90 tablet 5   OXcarbazepine  (TRILEPTAL ) 150 MG tablet Take 3 tablets (450 mg total) by mouth at bedtime. 90 tablet 5   Semaglutide ,0.25 or 0.5MG /DOS, (OZEMPIC , 0.25 OR 0.5 MG/DOSE,) 2 MG/3ML SOPN Inject 0.5 mg into the skin once a week. 3 mL 2   polyethylene glycol (MIRALAX  / GLYCOLAX ) 17 g packet Take 17 g by mouth daily. 90 packet 1   Semaglutide ,0.25 or 0.5MG /DOS, 2 MG/3ML SOPN Inject 0.5 mg into the skin once a week. 3 mL 2   Vitamin D , Ergocalciferol , (DRISDOL ) 1.25 MG (50000 UNIT) CAPS capsule Take 1 capsule (50,000 Units total) by mouth every 7 (seven) days. 15 capsule 3   No current facility-administered medications on file prior to visit.   Allergies  Allergen Reactions   Latex Rash   Penicillins Rash    Has patient had a PCN reaction causing immediate rash, facial/tongue/throat swelling, SOB or lightheadedness with hypotension: Yes Has patient had a PCN reaction causing severe rash involving mucus membranes or skin necrosis: No Has patient had a PCN reaction that required hospitalization No Has patient had a PCN reaction occurring within the last 10 years: No If all of the above answers are NO, then may proceed with Cephalosporin use.    Family History  Problem Relation Age of Onset   Thyroid  disease Mother    Hypertension Mother    Anemia Mother    Diabetes Father    Hearing loss Father    Hypertension  Other    Diabetes Other    Thyroid  disease Other    Heart disease Other    ADD / ADHD Son    PE: BP 124/70   Pulse 74   Ht 5' 7 (1.702 m)   Wt 206 lb 6.4 oz  (93.6 kg)   LMP 08/28/2016 (Exact Date) Comment: come back on 09/15/2016  SpO2 95%   BMI 32.33 kg/m  Wt Readings from Last 3 Encounters:  02/25/24 206 lb 6.4 oz (93.6 kg)  11/18/23 201 lb 3.2 oz (91.3 kg)  10/27/23 200 lb (90.7 kg)   Constitutional: overweight, in NAD. No kyphosis. Eyes: EOMI, + bilateral symmetric exophthalmos, no lid lag, + mild chemosis L eye ENT: no thyromegaly, no cervical lymphadenopathy, + cervical scar healed with a little keloid on the left half of the scar Cardiovascular: RRR, No MRG Respiratory: CTA B Musculoskeletal: no deformities Skin:  no rashes Neurological: no tremor with outstretched hands  Assessment: 1.  History of primary hyperparathyroidism  2.  Acquired Hypothyroidism  3.  Vitamin D  deficiency  4. DM2, non-insulin-dependent - Appears to be well-controlled on the weekly GLP-1 receptor agonist, which she tolerates well -Latest HbA1c level reviewed: Lab Results  Component Value Date   HGBA1C 6.6 (H) 06/29/2023  - At last visit I did not suggest any changes in her regimen and no changes needed for now - Continues to follow with PCP  Plan: Patient with a history of severe hypercalcemia and vitamin D  deficiency, with previously undetectably high calcium   (!!).  She had a technetium sestamibi scan 04/04/2021 which showed a parathyroid  adenoma of the right inferior gland and she had parathyroidectomy by Dr. Eletha on 04/06/2021.  Final pathology showed a parathyroid  adenoma, not carcinoma.  After surgery, phosphorus was very low in the hospital, consistent with hungry bone syndrome, but then normalized. -She has some keloid at the neck surgical scar site-I previously recommended  kelo-cote strips >> she did not try this, but only used Mederma cream - She does not have tingling or muscle cramping; we discussed that if she develops these, to take a calcium  tablet but otherwise no intervention is needed for now.  She is not taking her calcium  tablet  consistently for now - Latest ionized calcium  level was normal, at 5.9, at last visit - Latest total calcium  was normal in 10/2023.  Will not repeat this today. - I will see the patient back in 1 year  2.  Acquired hypothyroidism - latest thyroid  labs reviewed with pt. >> normal: Lab Results  Component Value Date   TSH 1.010 06/29/2023  - she continues on LT4 125 mcg daily - pt feels good on this dose.  Her only complaint today is eye irritation, possibly related to dry eyes.  I recommended Refresh Celluvisc eye gel. - we discussed about taking the thyroid  hormone every day, with water, >30 minutes before breakfast, separated by >4 hours from acid reflux medications, calcium , iron , multivitamins. Pt. is taking it correctly. - will check thyroid  tests today: TSH and fT4 - If labs are abnormal, she will need to return for repeat TFTs in 1.5 months  3.  Vitamin D  deficiency -She had a very low vitamin D  level in the past, 12.4 -She was on ergocalciferol  50,000 units twice a week but then vitamin D  level returned elevated, at 117, so we stopped the vitamin D  supplement for 2 weeks and restarted it once a week - At last check in 02/2023, her vitamin D  level  was normal: Lab Results  Component Value Date   VD25OH 55.88 02/25/2023   VD25OH 44.8 07/07/2022  - She continues on weekly ergocalciferol  - We will recheck a vitamin D  level today  Needs refills - [facility].  Lela Fendt, MD PhD [facility]

## 2024-02-26 ENCOUNTER — Other Ambulatory Visit (HOSPITAL_BASED_OUTPATIENT_CLINIC_OR_DEPARTMENT_OTHER): Payer: Self-pay

## 2024-02-26 ENCOUNTER — Ambulatory Visit: Payer: Self-pay | Admitting: Internal Medicine

## 2024-02-26 LAB — T4, FREE: Free T4: 0.8 ng/dL (ref 0.8–1.8)

## 2024-02-26 LAB — VITAMIN D 25 HYDROXY (VIT D DEFICIENCY, FRACTURES): Vit D, 25-Hydroxy: 107 ng/mL — ABNORMAL HIGH (ref 30–100)

## 2024-02-26 LAB — TSH: TSH: 8.67 m[IU]/L — ABNORMAL HIGH (ref 0.40–4.50)

## 2024-02-26 MED ORDER — LEVOTHYROXINE SODIUM 125 MCG PO TABS: 125.0000 ug | ORAL_TABLET | Freq: Every day | ORAL | 3 refills | Status: DC

## 2024-02-26 NOTE — Addendum Note (Signed)
 Addended by: TRIXIE FILE on: 02/26/2024 04:19 PM   Modules accepted: Orders

## 2024-02-29 ENCOUNTER — Encounter: Payer: Self-pay | Admitting: Family

## 2024-02-29 ENCOUNTER — Other Ambulatory Visit (HOSPITAL_BASED_OUTPATIENT_CLINIC_OR_DEPARTMENT_OTHER): Payer: Self-pay

## 2024-02-29 ENCOUNTER — Encounter: Payer: Self-pay | Admitting: Nurse Practitioner

## 2024-02-29 MED ORDER — VITAMIN D3 50 MCG (2000 UT) PO TABS
2000.0000 [IU] | ORAL_TABLET | Freq: Every day | ORAL | 2 refills | Status: AC
  Filled 2024-02-29: qty 100, 100d supply, fill #0
  Filled 2024-06-03: qty 100, 100d supply, fill #1

## 2024-02-29 NOTE — Progress Notes (Deleted)
 LILLETTE Kristeen JINNY Gladis, CMA,acting as a Neurosurgeon for Gaines Ada, FNP.,have documented all relevant documentation on the behalf of Gaines Ada, FNP,as directed by  Gaines Ada, FNP while in the presence of Gaines Ada, FNP.  Subjective:    Patient ID: Amanda Henry , female    DOB: Oct 29, 1965 , 58 y.o.   MRN: [id number]  No chief complaint on file.   HPI  HPI   Past Medical History:  Diagnosis Date   Allergy    Anxiety    Arthritis    Blood transfusion without reported diagnosis    Depression    DM2 (diabetes mellitus, type 2) (HCC)    Hypercalcemia 04/03/2021   Hypertension    no medications at this time   Hypokalemia 04/03/2021   Hypothyroidism    Iron  deficiency anemia    Obesity    OSA on CPAP    mild obstructive sleep apnea with an AHI of 7.7/h.REM sleep at 22/hr   Palpitations    Subarachnoid hemorrhage (HCC)    Thyroid  disease    Vitamin D  deficiency      Family History  Problem Relation Age of Onset   Thyroid  disease Mother    Hypertension Mother    Anemia Mother    Diabetes Father    Hearing loss Father    Hypertension Other    Diabetes Other    Thyroid  disease Other    Heart disease Other    ADD / ADHD Son      Current Outpatient Medications:    amLODipine -olmesartan  (AZOR ) 10-40 MG tablet, Take 1 tablet by mouth daily., Disp: 90 tablet, Rfl: 1   atorvastatin  (LIPITOR) 20 MG tablet, Take 1 tablet (20 mg total) by mouth daily., Disp: 90 tablet, Rfl: 3   Blood Glucose Monitoring Suppl (ACCU-CHEK GUIDE ME) w/Device KIT, USE AS DIRECTED FOUR TIMES DAILY, Disp: 1 kit, Rfl: 1   chlordiazePOXIDE  (LIBRIUM ) 5 MG capsule, Take 3-4 capsules (15-20 mg total) by mouth at bedtime., Disp: 120 capsule, Rfl: 2   doxycycline  (MONODOX ) 100 MG capsule, Take 1 capsule (100 mg total) by mouth 2 (two) times daily., Disp: 14 capsule, Rfl: 0   escitalopram  (LEXAPRO ) 10 MG tablet, Take 1 tablet (10 mg total) by mouth daily., Disp: 30 tablet, Rfl: 1   escitalopram  (LEXAPRO ) 10  MG tablet, Take 1 tablet (10 mg total) by mouth daily., Disp: 30 tablet, Rfl: 5   fexofenadine  (ALLEGRA  ALLERGY) 180 MG tablet, Take 1 tablet (180 mg total) by mouth daily., Disp: 90 tablet, Rfl: 1   gabapentin (NEURONTIN) 100 MG capsule, Take 1 capsule by mouth at bedtime., Disp: , Rfl:    Glucagon  (GVOKE HYPOPEN  2-PACK) 0.5 MG/0.1ML SOAJ, Inject 0.1 mg into the skin as needed., Disp: 0.1 mL, Rfl: 6   glucose blood (ACCU-CHEK GUIDE TEST) test strip, Use every day as directed., Disp: 50 strip, Rfl: 0   hydrochlorothiazide  (HYDRODIURIL ) 25 MG tablet, Take 0.5 tablets (12.5 mg total) by mouth daily., Disp: , Rfl:    hydrochlorothiazide  (HYDRODIURIL ) 25 MG tablet, Take 1 tablet (25 mg total) by mouth daily., Disp: 90 tablet, Rfl: 3   levothyroxine  (SYNTHROID ) 125 MCG tablet, Take 1 tablet (125 mcg total) by mouth daily., Disp: 90 tablet, Rfl: 3   linaclotide  (LINZESS ) 290 MCG CAPS capsule, Take 1 capsule (290 mcg total) by mouth daily 30 minutes before breakfast., Disp: 90 capsule, Rfl: 4   MAGNESIUM  GLYCINATE PO, Take 500 mg by mouth., Disp: , Rfl:    meclizine  (ANTIVERT ) 12.5 MG  tablet, Take 1 tablet (12.5 mg total) by mouth 3 (three) times daily as needed for dizziness., Disp: 30 tablet, Rfl: 1   metoprolol  tartrate (LOPRESSOR ) 25 MG tablet, Take 0.5 tablets (12.5 mg total) by mouth every 12 (twelve) hours as needed (palpitations)., Disp: 15 tablet, Rfl: 1   mometasone  (NASONEX ) 50 MCG/ACT nasal spray, Place 2 sprays into the nose daily., Disp: 1 each, Rfl: 2   OXcarbazepine  (TRILEPTAL ) 150 MG tablet, Take 150 mg by mouth at bedtime., Disp: , Rfl:    OXcarbazepine  (TRILEPTAL ) 150 MG tablet, Take 3 tablets (450 mg total) by mouth at bedtime., Disp: 90 tablet, Rfl: 5   Semaglutide ,0.25 or 0.5MG /DOS, (OZEMPIC , 0.25 OR 0.5 MG/DOSE,) 2 MG/3ML SOPN, Inject 0.5 mg into the skin once a week., Disp: 3 mL, Rfl: 2   Vitamin D , Ergocalciferol , (DRISDOL ) 1.25 MG (50000 UNIT) CAPS capsule, Take 1 capsule (50,000  Units total) by mouth every 7 (seven) days., Disp: 15 capsule, Rfl: 3   Allergies  Allergen Reactions   Latex Rash   Penicillins Rash    Has patient had a PCN reaction causing immediate rash, facial/tongue/throat swelling, SOB or lightheadedness with hypotension: Yes Has patient had a PCN reaction causing severe rash involving mucus membranes or skin necrosis: No Has patient had a PCN reaction that required hospitalization No Has patient had a PCN reaction occurring within the last 10 years: No If all of the above answers are NO, then may proceed with Cephalosporin use.       The patient states she uses {contraceptive methods:5051} for birth control. Patient's last menstrual period was 08/28/2016 (exact date).. {Dysmenorrhea-menorrhagia:21918}. Negative for: breast discharge, breast lump(s), breast pain and breast self exam. Associated symptoms include abnormal vaginal bleeding. Pertinent negatives include abnormal bleeding (hematology), anxiety, decreased libido, depression, difficulty falling sleep, dyspareunia, history of infertility, nocturia, sexual dysfunction, sleep disturbances, urinary incontinence, urinary urgency, vaginal discharge and vaginal itching. Diet regular.The patient states her exercise level is    . The patient's tobacco use is:  Social History   Tobacco Use  Smoking Status Never  Smokeless Tobacco Never  . She has been exposed to passive smoke. The patient's alcohol use is:  Social History   Substance and Sexual Activity  Alcohol Use No  . Additional information: Last pap ***, next one scheduled for ***.    Review of Systems   There were no vitals filed for this visit. There is no height or weight on file to calculate BMI.  Wt Readings from Last 3 Encounters:  02/25/24 206 lb 6.4 oz (93.6 kg)  11/18/23 201 lb 3.2 oz (91.3 kg)  10/27/23 200 lb (90.7 kg)     Objective:  Physical Exam      Assessment And Plan:     Encounter for annual health  examination  Essential hypertension  Acquired hypothyroidism  Mixed hyperlipidemia  Uncontrolled type 2 diabetes mellitus with hyperglycemia (HCC)     No follow-ups on file. Patient was given opportunity to ask questions. Patient verbalized understanding of the plan and was able to repeat key elements of the plan. All questions were answered to their satisfaction.   Gaines Ada, FNP  I, Gaines Ada, FNP, have reviewed all documentation for this visit. The documentation on 02/29/24 for the exam, diagnosis, procedures, and orders are all accurate and complete.

## 2024-03-01 ENCOUNTER — Other Ambulatory Visit (HOSPITAL_BASED_OUTPATIENT_CLINIC_OR_DEPARTMENT_OTHER): Payer: Self-pay

## 2024-03-07 ENCOUNTER — Other Ambulatory Visit (HOSPITAL_BASED_OUTPATIENT_CLINIC_OR_DEPARTMENT_OTHER): Payer: Self-pay

## 2024-03-07 ENCOUNTER — Encounter: Payer: Self-pay | Admitting: Internal Medicine

## 2024-03-07 ENCOUNTER — Other Ambulatory Visit: Payer: Self-pay

## 2024-03-08 ENCOUNTER — Other Ambulatory Visit: Payer: Self-pay

## 2024-03-08 ENCOUNTER — Other Ambulatory Visit: Payer: Self-pay | Admitting: Internal Medicine

## 2024-03-08 ENCOUNTER — Other Ambulatory Visit (HOSPITAL_BASED_OUTPATIENT_CLINIC_OR_DEPARTMENT_OTHER): Payer: Self-pay

## 2024-03-08 MED ORDER — LEVOTHYROXINE SODIUM 137 MCG PO TABS
137.0000 ug | ORAL_TABLET | Freq: Every day | ORAL | 3 refills | Status: AC
  Filled 2024-03-08: qty 90, 90d supply, fill #0
  Filled 2024-06-07: qty 90, 90d supply, fill #1

## 2024-03-09 ENCOUNTER — Other Ambulatory Visit (HOSPITAL_BASED_OUTPATIENT_CLINIC_OR_DEPARTMENT_OTHER): Payer: Self-pay

## 2024-03-09 ENCOUNTER — Encounter: Payer: Self-pay | Admitting: Nurse Practitioner

## 2024-03-09 ENCOUNTER — Ambulatory Visit (INDEPENDENT_AMBULATORY_CARE_PROVIDER_SITE_OTHER): Admitting: Nurse Practitioner

## 2024-03-09 VITALS — BP 120/68 | HR 67 | Temp 97.8°F | Ht 67.0 in | Wt 207.4 lb

## 2024-03-09 DIAGNOSIS — E66811 Obesity, class 1: Secondary | ICD-10-CM

## 2024-03-09 DIAGNOSIS — Z6832 Body mass index (BMI) 32.0-32.9, adult: Secondary | ICD-10-CM

## 2024-03-09 DIAGNOSIS — F418 Other specified anxiety disorders: Secondary | ICD-10-CM

## 2024-03-09 DIAGNOSIS — E559 Vitamin D deficiency, unspecified: Secondary | ICD-10-CM

## 2024-03-09 DIAGNOSIS — E039 Hypothyroidism, unspecified: Secondary | ICD-10-CM

## 2024-03-09 DIAGNOSIS — Z0000 Encounter for general adult medical examination without abnormal findings: Secondary | ICD-10-CM | POA: Diagnosis not present

## 2024-03-09 DIAGNOSIS — Z23 Encounter for immunization: Secondary | ICD-10-CM | POA: Diagnosis not present

## 2024-03-09 DIAGNOSIS — E6609 Other obesity due to excess calories: Secondary | ICD-10-CM

## 2024-03-09 DIAGNOSIS — Z79899 Other long term (current) drug therapy: Secondary | ICD-10-CM

## 2024-03-09 DIAGNOSIS — I10 Essential (primary) hypertension: Secondary | ICD-10-CM | POA: Diagnosis not present

## 2024-03-09 DIAGNOSIS — E782 Mixed hyperlipidemia: Secondary | ICD-10-CM | POA: Diagnosis not present

## 2024-03-09 DIAGNOSIS — E1169 Type 2 diabetes mellitus with other specified complication: Secondary | ICD-10-CM | POA: Diagnosis not present

## 2024-03-09 DIAGNOSIS — Z139 Encounter for screening, unspecified: Secondary | ICD-10-CM

## 2024-03-09 DIAGNOSIS — E669 Obesity, unspecified: Secondary | ICD-10-CM

## 2024-03-09 LAB — POCT URINALYSIS DIP (CLINITEK)
Bilirubin, UA: NEGATIVE
Blood, UA: NEGATIVE
Glucose, UA: NEGATIVE mg/dL
Ketones, POC UA: NEGATIVE mg/dL
Leukocytes, UA: NEGATIVE
Nitrite, UA: NEGATIVE
POC PROTEIN,UA: NEGATIVE
Spec Grav, UA: 1.01 (ref 1.010–1.025)
Urobilinogen, UA: 0.2 U/dL
pH, UA: 7 (ref 5.0–8.0)

## 2024-03-09 MED ORDER — AZELASTINE HCL 137 MCG/SPRAY NA SOLN
2.0000 | Freq: Every day | NASAL | 5 refills | Status: DC
  Filled 2024-03-09: qty 30, 50d supply, fill #0
  Filled 2024-05-08: qty 30, 50d supply, fill #1

## 2024-03-09 MED ORDER — SEMAGLUTIDE (1 MG/DOSE) 4 MG/3ML ~~LOC~~ SOPN
1.0000 mg | PEN_INJECTOR | SUBCUTANEOUS | 1 refills | Status: AC
  Filled 2024-03-09: qty 3, 28d supply, fill #0
  Filled 2024-03-16: qty 6, 56d supply, fill #0
  Filled 2024-05-08: qty 6, 56d supply, fill #1

## 2024-03-09 NOTE — Progress Notes (Deleted)
 Subjective:  Patient ID: Amanda Henry , female    DOB: 08/02/1965 , 58 y.o.   MRN: [id number]  Chief Complaint  Patient presents with   Annual Exam    Patient presents today for HM, Patient reports compliance with medication. Patient denies any chest pain, SOB, or headaches. Patient states after her ozempic  she has stomach discomfort.     HPI Discussed the use of AI scribe software for clinical note transcription with the patient, who gave verbal consent to proceed.  History of Present Illness         Past Medical History:  Diagnosis Date   Allergy    Anxiety    Arthritis    Blood transfusion without reported diagnosis    Depression    DM2 (diabetes mellitus, type 2) (HCC)    Hypercalcemia 04/03/2021   Hypertension    no medications at this time   Hypokalemia 04/03/2021   Hypothyroidism    Iron  deficiency anemia    Obesity    OSA on CPAP    mild obstructive sleep apnea with an AHI of 7.7/h.REM sleep at 22/hr   Palpitations    Subarachnoid hemorrhage (HCC)    Thyroid  disease    Vitamin D  deficiency      Family History  Problem Relation Age of Onset   Thyroid  disease Mother    Hypertension Mother    Anemia Mother    Diabetes Father    Hearing loss Father    Hypertension Other    Diabetes Other    Thyroid  disease Other    Heart disease Other    ADD / ADHD Son      Current Outpatient Medications:    amLODipine -olmesartan  (AZOR ) 10-40 MG tablet, Take 1 tablet by mouth daily., Disp: 90 tablet, Rfl: 1   atorvastatin  (LIPITOR) 20 MG tablet, Take 1 tablet (20 mg total) by mouth daily., Disp: 90 tablet, Rfl: 3   Blood Glucose Monitoring Suppl (ACCU-CHEK GUIDE ME) w/Device KIT, USE AS DIRECTED FOUR TIMES DAILY, Disp: 1 kit, Rfl: 1   chlordiazePOXIDE  (LIBRIUM ) 5 MG capsule, Take 3-4 capsules (15-20 mg total) by mouth at bedtime., Disp: 120 capsule, Rfl: 2   Cholecalciferol (VITAMIN D3) 50 MCG (2000 UT) TABS, Take 1 tablet (2,000 Units total) by mouth  daily., Disp: 100 tablet, Rfl: 2   doxycycline  (MONODOX ) 100 MG capsule, Take 1 capsule (100 mg total) by mouth 2 (two) times daily., Disp: 14 capsule, Rfl: 0   escitalopram  (LEXAPRO ) 10 MG tablet, Take 1 tablet (10 mg total) by mouth daily., Disp: 30 tablet, Rfl: 1   escitalopram  (LEXAPRO ) 10 MG tablet, Take 1 tablet (10 mg total) by mouth daily., Disp: 30 tablet, Rfl: 5   fexofenadine  (ALLEGRA  ALLERGY) 180 MG tablet, Take 1 tablet (180 mg total) by mouth daily., Disp: 90 tablet, Rfl: 1   gabapentin (NEURONTIN) 100 MG capsule, Take 1 capsule by mouth at bedtime., Disp: , Rfl:    Glucagon  (GVOKE HYPOPEN  2-PACK) 0.5 MG/0.1ML SOAJ, Inject 0.1 mg into the skin as needed., Disp: 0.1 mL, Rfl: 6   glucose blood (ACCU-CHEK GUIDE TEST) test strip, Use every day as directed., Disp: 50 strip, Rfl: 0   hydrochlorothiazide  (HYDRODIURIL ) 25 MG tablet, Take 0.5 tablets (12.5 mg total) by mouth daily., Disp: , Rfl:    hydrochlorothiazide  (HYDRODIURIL ) 25 MG tablet, Take 1 tablet (25 mg total) by mouth daily., Disp: 90 tablet, Rfl: 3   levothyroxine  (SYNTHROID ) 137 MCG tablet, Take 1 tablet (137 mcg total) by  mouth daily before breakfast., Disp: 90 tablet, Rfl: 3   linaclotide  (LINZESS ) 290 MCG CAPS capsule, Take 1 capsule (290 mcg total) by mouth daily 30 minutes before breakfast., Disp: 90 capsule, Rfl: 4   MAGNESIUM  GLYCINATE PO, Take 500 mg by mouth., Disp: , Rfl:    meclizine  (ANTIVERT ) 12.5 MG tablet, Take 1 tablet (12.5 mg total) by mouth 3 (three) times daily as needed for dizziness., Disp: 30 tablet, Rfl: 1   metoprolol  tartrate (LOPRESSOR ) 25 MG tablet, Take 0.5 tablets (12.5 mg total) by mouth every 12 (twelve) hours as needed (palpitations)., Disp: 15 tablet, Rfl: 1   mometasone  (NASONEX ) 50 MCG/ACT nasal spray, Place 2 sprays into the nose daily., Disp: 1 each, Rfl: 2   OXcarbazepine  (TRILEPTAL ) 150 MG tablet, Take 150 mg by mouth at bedtime., Disp: , Rfl:    OXcarbazepine  (TRILEPTAL ) 150 MG tablet,  Take 3 tablets (450 mg total) by mouth at bedtime., Disp: 90 tablet, Rfl: 5   Semaglutide ,0.25 or 0.5MG /DOS, (OZEMPIC , 0.25 OR 0.5 MG/DOSE,) 2 MG/3ML SOPN, Inject 0.5 mg into the skin once a week., Disp: 3 mL, Rfl: 2   Vitamin D , Ergocalciferol , (DRISDOL ) 1.25 MG (50000 UNIT) CAPS capsule, Take 1 capsule (50,000 Units total) by mouth every 7 (seven) days., Disp: 15 capsule, Rfl: 3   Allergies  Allergen Reactions   Latex Rash   Penicillins Rash    Has patient had a PCN reaction causing immediate rash, facial/tongue/throat swelling, SOB or lightheadedness with hypotension: Yes Has patient had a PCN reaction causing severe rash involving mucus membranes or skin necrosis: No Has patient had a PCN reaction that required hospitalization No Has patient had a PCN reaction occurring within the last 10 years: No If all of the above answers are NO, then may proceed with Cephalosporin use.      Review of Systems   Today's Vitals   03/09/24 1013  BP: 120/68  Pulse: 67  Temp: 97.8 F (36.6 C)  TempSrc: Oral  Weight: 207 lb 6.4 oz (94.1 kg)  Height: 5' 7 (1.702 m)  PainSc: 0-No pain   Body mass index is 32.48 kg/m.  Wt Readings from Last 3 Encounters:  03/09/24 207 lb 6.4 oz (94.1 kg)  02/25/24 206 lb 6.4 oz (93.6 kg)  11/18/23 201 lb 3.2 oz (91.3 kg)    The 10-year ASCVD risk score (Arnett DK, et al., 2019) is: 9.3%   Values used to calculate the score:     Age: 47 years     Clincally relevant sex: Female     Is Non-Hispanic African American: Yes     Diabetic: Yes     Tobacco smoker: No     Systolic Blood Pressure: 120 mmHg     Is BP treated: Yes     HDL Cholesterol: 46 mg/dL     Total Cholesterol: 157 mg/dL  Objective:  Physical Exam      Assessment And Plan:  Encounter for annual health examination  Essential hypertension -     EKG 12-Lead -     POCT URINALYSIS DIP (CLINITEK) -     Microalbumin / creatinine urine ratio  Mixed hyperlipidemia  Vitamin D   deficiency  Type 2 diabetes mellitus in patient with obesity (HCC)  Need for influenza vaccination -     Flu vaccine trivalent PF, 6mos and older(Flulaval,Afluria,Fluarix,Fluzone)  Encounter for screening  Other long term (current) drug therapy  Obesity (BMI 30.0-34.9)    Assessment and Plan      Return  for 1 year physical, controlled DM check 4 months.  Patient was given opportunity to ask questions. Patient verbalized understanding of the plan and was able to repeat key elements of the plan. All questions were answered to their satisfaction.    LILLETTE Gaines Ada, FNP, have reviewed all documentation for this visit. The documentation on 03/09/24 for the exam, diagnosis, procedures, and orders are all accurate and complete.   IF YOU HAVE BEEN REFERRED TO A SPECIALIST, IT MAY TAKE 1-2 WEEKS TO SCHEDULE/PROCESS THE REFERRAL. IF YOU HAVE NOT HEARD FROM US /SPECIALIST IN TWO WEEKS, PLEASE GIVE US  A CALL AT [phone].

## 2024-03-09 NOTE — Progress Notes (Signed)
 Amanda Henry, CMA,acting as a neurosurgeon for Amanda Ada, FNP.,have documented all relevant documentation on the behalf of Amanda Ada, FNP,as directed by  Amanda Ada, FNP while in the presence of Amanda Ada, FNP.  Subjective:    Patient ID: Amanda Henry , female    DOB: 1965/12/04 , 58 y.o.   MRN: [id number]  Chief Complaint  Patient presents with   Annual Exam    Patient presents today for HM, Patient reports compliance with medication. Patient denies any chest pain, SOB, or headaches. Patient states after her ozempic  she has stomach discomfort.     HPI Discussed the use of AI scribe software for clinical note transcription with the patient, who gave verbal consent to proceed.  History of Present Illness Amanda Henry is a 58 year old female with diabetes, anxiety, PTSD, and hypothyroidism who presents for an annual physical exam and follow-up on her chronic conditions.  Her weight has increased to 207 pounds, which is higher than her usual range of 200-201 pounds. She attributes this to her thyroid  levels being high. She is trying to eat healthily but finds it challenging to lose weight. She is currently on Ozempic  0.5 mg weekly for diabetes management and has experienced some side effects such as nausea and tightness in her abdomen after injections.  She has a history of anxiety, PTSD, and depression, for which she is under the care of a psychiatrist. Her medication regimen was adjusted to replace alprazolam  with a less addictive alternative. She has difficulty sleeping but has not been diagnosed with insomnia. She experiences low energy levels, which she attributes to her thyroid  condition, making it difficult to engage in regular exercise.  She experienced muscle pain in her shoulder and lower back, which she managed with Tylenol , ibuprofen , and a heating pad. The pain resolved after a few days, and she suspects it may have been due to muscle strain from lifting or pushing  activities.  She is currently taking levothyroxine , which was recently refilled. She also uses a nasal spray that she finds effective and does not cause dryness. She takes Linzess  for constipation, preferring to take it at night to manage her symptoms better.  She works as a engineer, technical sales in a school setting, which involves being around children. She has received her flu shot and is considering the pneumonia vaccine due to her age and diabetes, which put her at higher risk for complications from respiratory infections. She does experience constipation. No current muscle pain or weakness.    Past Medical History:  Diagnosis Date   Allergy    Anxiety    Arthritis    Blood transfusion without reported diagnosis    Depression    DM2 (diabetes mellitus, type 2) (HCC)    Hypercalcemia 04/03/2021   Hypertension    no medications at this time   Hypokalemia 04/03/2021   Hypothyroidism    Iron  deficiency anemia    Obesity    OSA on CPAP    mild obstructive sleep apnea with an AHI of 7.7/h.REM sleep at 22/hr   Palpitations    SAH (subarachnoid hemorrhage) (HCC) 03/15/2021   Subarachnoid hemorrhage (HCC)    Thyroid  disease    Vitamin D  deficiency      Family History  Problem Relation Age of Onset   Thyroid  disease Mother    Hypertension Mother    Anemia Mother    Diabetes Father    Hearing loss Father    Hypertension Other    Diabetes Other  Thyroid  disease Other    Heart disease Other    ADD / ADHD Son      Current Outpatient Medications:    amLODipine -olmesartan  (AZOR ) 10-40 MG tablet, Take 1 tablet by mouth daily., Disp: 90 tablet, Rfl: 1   atorvastatin  (LIPITOR) 20 MG tablet, Take 1 tablet (20 mg total) by mouth daily., Disp: 90 tablet, Rfl: 3   Azelastine  HCl 137 MCG/SPRAY SOLN, Place 2 sprays into the nose daily., Disp: 30 mL, Rfl: 5   Blood Glucose Monitoring Suppl (ACCU-CHEK GUIDE ME) w/Device KIT, USE AS DIRECTED FOUR TIMES DAILY, Disp: 1 kit, Rfl: 1   chlordiazePOXIDE   (LIBRIUM ) 5 MG capsule, Take 3-4 capsules (15-20 mg total) by mouth at bedtime., Disp: 120 capsule, Rfl: 2   Cholecalciferol (VITAMIN D3) 50 MCG (2000 UT) TABS, Take 1 tablet (2,000 Units total) by mouth daily., Disp: 100 tablet, Rfl: 2   escitalopram  (LEXAPRO ) 10 MG tablet, Take 1 tablet (10 mg total) by mouth daily., Disp: 30 tablet, Rfl: 5   fexofenadine  (ALLEGRA  ALLERGY) 180 MG tablet, Take 1 tablet (180 mg total) by mouth daily., Disp: 90 tablet, Rfl: 1   gabapentin (NEURONTIN) 100 MG capsule, Take 1 capsule by mouth at bedtime., Disp: , Rfl:    Glucagon  (GVOKE HYPOPEN  2-PACK) 0.5 MG/0.1ML SOAJ, Inject 0.1 mg into the skin as needed., Disp: 0.1 mL, Rfl: 6   glucose blood (ACCU-CHEK GUIDE TEST) test strip, Use every day as directed., Disp: 50 strip, Rfl: 0   hydrochlorothiazide  (HYDRODIURIL ) 25 MG tablet, Take 0.5 tablets (12.5 mg total) by mouth daily., Disp: , Rfl:    hydrochlorothiazide  (HYDRODIURIL ) 25 MG tablet, Take 1 tablet (25 mg total) by mouth daily., Disp: 90 tablet, Rfl: 3   levothyroxine  (SYNTHROID ) 137 MCG tablet, Take 1 tablet (137 mcg total) by mouth daily before breakfast., Disp: 90 tablet, Rfl: 3   linaclotide  (LINZESS ) 290 MCG CAPS capsule, Take 1 capsule (290 mcg total) by mouth daily 30 minutes before breakfast., Disp: 90 capsule, Rfl: 4   MAGNESIUM  GLYCINATE PO, Take 500 mg by mouth., Disp: , Rfl:    meclizine  (ANTIVERT ) 12.5 MG tablet, Take 1 tablet (12.5 mg total) by mouth 3 (three) times daily as needed for dizziness., Disp: 30 tablet, Rfl: 1   metoprolol  tartrate (LOPRESSOR ) 25 MG tablet, Take 0.5 tablets (12.5 mg total) by mouth every 12 (twelve) hours as needed (palpitations)., Disp: 15 tablet, Rfl: 1   OXcarbazepine  (TRILEPTAL ) 150 MG tablet, Take 3 tablets (450 mg total) by mouth at bedtime., Disp: 90 tablet, Rfl: 5   Semaglutide , 1 MG/DOSE, 4 MG/3ML SOPN, Inject 1 mg into the skin once a week., Disp: 6 mL, Rfl: 1   Vitamin D , Ergocalciferol , (DRISDOL ) 1.25 MG (50000  UNIT) CAPS capsule, Take 1 capsule (50,000 Units total) by mouth every 7 (seven) days., Disp: 15 capsule, Rfl: 3   Allergies  Allergen Reactions   Latex Rash   Penicillins Rash    Has patient had a PCN reaction causing immediate rash, facial/tongue/throat swelling, SOB or lightheadedness with hypotension: Yes Has patient had a PCN reaction causing severe rash involving mucus membranes or skin necrosis: No Has patient had a PCN reaction that required hospitalization No Has patient had a PCN reaction occurring within the last 10 years: No If all of the above answers are NO, then may proceed with Cephalosporin use.       The patient states she uses status post hysterectomy for birth control. Patient's last menstrual period was 08/28/2016 (exact date).SABRA  Negative for Dysmenorrhea and Negative for Menorrhagia. Negative for: breast discharge, breast lump(s), breast pain and breast self exam. Associated symptoms include abnormal vaginal bleeding. Pertinent negatives include abnormal bleeding (hematology), decreased libido, difficulty falling sleep, dyspareunia, history of infertility, nocturia, sexual dysfunction, urinary incontinence, urinary urgency, vaginal discharge and vaginal itching. Diet regular; she is eating more healthy than not. The patient states her exercise level is minimal due to depression, anxiety and PTSD.   The patient's tobacco use is:  Social History   Tobacco Use  Smoking Status Never  Smokeless Tobacco Never   She has been exposed to passive smoke. The patient's alcohol use is:  Social History   Substance and Sexual Activity  Alcohol Use No     Review of Systems  Constitutional: Negative.   HENT: Negative.    Eyes: Negative.   Respiratory: Negative.    Cardiovascular: Negative.   Gastrointestinal:  Positive for constipation.  Endocrine: Negative.   Genitourinary: Negative.   Musculoskeletal: Negative.   Skin: Negative.   Allergic/Immunologic: Negative.    Neurological: Negative.   Hematological: Negative.   Psychiatric/Behavioral: Negative.       Today's Vitals   03/09/24 1013  BP: 120/68  Pulse: 67  Temp: 97.8 F (36.6 C)  TempSrc: Oral  Weight: 207 lb 6.4 oz (94.1 kg)  Height: 5' 7 (1.702 m)  PainSc: 0-No pain   Body mass index is 32.48 kg/m.  Wt Readings from Last 3 Encounters:  03/09/24 207 lb 6.4 oz (94.1 kg)  02/25/24 206 lb 6.4 oz (93.6 kg)  11/18/23 201 lb 3.2 oz (91.3 kg)     Objective:  Physical Exam Vitals and nursing note reviewed.  Constitutional:      General: She is not in acute distress.    Appearance: Normal appearance. She is well-developed. She is obese.  HENT:     Head: Normocephalic and atraumatic.     Right Ear: Hearing, tympanic membrane, ear canal and external ear normal.     Left Ear: Hearing, tympanic membrane, ear canal and external ear normal.     Nose: Nose normal.     Mouth/Throat:     Mouth: Mucous membranes are moist.  Eyes:     General: Lids are normal.     Conjunctiva/sclera: Conjunctivae normal.     Pupils: Pupils are equal, round, and reactive to light.     Funduscopic exam:    Right eye: No papilledema.        Left eye: No papilledema.  Neck:     Thyroid : No thyroid  mass.     Vascular: No carotid bruit.  Cardiovascular:     Rate and Rhythm: Normal rate and regular rhythm.     Pulses: Normal pulses.     Heart sounds: Normal heart sounds. No murmur heard. Pulmonary:     Effort: Pulmonary effort is normal. No respiratory distress.     Breath sounds: Normal breath sounds. No wheezing.  Abdominal:     General: Abdomen is flat. Bowel sounds are normal.     Palpations: Abdomen is soft.  Musculoskeletal:        General: No swelling or tenderness. Normal range of motion.     Cervical back: Full passive range of motion without pain, normal range of motion and neck supple.     Right lower leg: No edema.     Left lower leg: No edema.  Skin:    General: Skin is warm and dry.      Capillary Refill:  Capillary refill takes less than 2 seconds.  Neurological:     General: No focal deficit present.     Mental Status: She is alert and oriented to person, place, and time.     Cranial Nerves: No cranial nerve deficit.     Sensory: No sensory deficit.  Psychiatric:        Mood and Affect: Mood normal.        Behavior: Behavior normal.        Thought Content: Thought content normal.        Judgment: Judgment normal.      Diabetic foot exam was performed with the following findings:   No deformities, ulcerations, or other skin breakdown Normal sensation of 10g monofilament Intact posterior tibialis and dorsalis pedis pulses        Assessment And Plan:     Encounter for annual health examination Assessment & Plan: Behavior modifications discussed and diet history reviewed.   Pt will continue to exercise regularly and modify diet with low GI, plant based foods and decrease intake of processed foods.  Recommend intake of daily multivitamin, Vitamin D , and calcium .  Recommend mammogram and colonoscopy for preventive screenings, as well as recommend immunizations that include influenza, TDAP, and Shingles    Essential hypertension Assessment & Plan: Blood pressure is well controlled, continue f/u with cardiology EKG done with t abnormality HR 61.   Orders: -     EKG 12-Lead -     POCT URINALYSIS DIP (CLINITEK) -     Microalbumin / creatinine urine ratio -     CMP14+EGFR  Mixed hyperlipidemia Assessment & Plan: Will check cholesterol, continue statin, tolerating well.   Orders: -     Lipid panel  Vitamin D  deficiency Assessment & Plan: Will check vitamin D  level and supplement as needed.    Also encouraged to spend 15 minutes in the sun daily.    Orders: -     VITAMIN D  25 Hydroxy (Vit-D Deficiency, Fractures)  Type 2 diabetes mellitus in patient with obesity (HCC) Assessment & Plan: Managed with Ozempic  0.5 mg weekly. Experiences injection site  pain and nausea. - Continue Ozempic  0.5 mg weekly. - Consider increasing Ozempic  dose if tolerated. - Discuss Rybelsus  as alternative. - Check A1c.  Orders: -     Hemoglobin A1c -     Semaglutide  (1 MG/DOSE); Inject 1 mg into the skin once a week.  Dispense: 6 mL; Refill: 1  Need for influenza vaccination Assessment & Plan: Influenza vaccine administered Encouraged to take Tylenol  as needed for fever or muscle aches.   Orders: -     Flu vaccine trivalent PF, 6mos and older(Flulaval,Afluria,Fluarix,Fluzone)  Encounter for screening -     Hepatitis B surface antibody,qualitative  Other long term (current) drug therapy -     CBC with Differential/Platelet  Need for pneumococcal vaccination -     Pneumococcal conjugate vaccine 20-valent  Class 1 obesity due to excess calories with serious comorbidity and body mass index (BMI) of 32.0 to 32.9 in adult Assessment & Plan: Current weight 207 lbs. Discussed weight loss challenges, role of physical activity and diet. - Encourage increased physical activity. - Discuss diet and weight management strategies.   Acquired hypothyroidism Assessment & Plan: Continue levothyroxine , will check levels. Continue f/u with Endocrinology. She has had a recent change in her medications and taking vitamin d    Depression with anxiety Assessment & Plan: Managed with medication adjustments by psychiatrist. Discussed mental health challenges. - Encourage positive  outlook and small steps towards improvement.   Other orders -     Azelastine  HCl; Place 2 sprays into the nose daily.  Dispense: 30 mL; Refill: 5    Return for 1 year physical, controlled DM check 4 months. Patient was given opportunity to ask questions. Patient verbalized understanding of the plan and was able to repeat key elements of the plan. All questions were answered to their satisfaction.   Amanda Ada, FNP  I, Amanda Ada, FNP, have reviewed all documentation for this  visit. The documentation on 03/09/24 for the exam, diagnosis, procedures, and orders are all accurate and complete.

## 2024-03-10 ENCOUNTER — Other Ambulatory Visit (HOSPITAL_BASED_OUTPATIENT_CLINIC_OR_DEPARTMENT_OTHER): Payer: Self-pay

## 2024-03-10 LAB — CMP14+EGFR
ALT: 26 IU/L (ref 0–32)
AST: 22 IU/L (ref 0–40)
Albumin: 4.4 g/dL (ref 3.8–4.9)
Alkaline Phosphatase: 98 IU/L (ref 49–135)
BUN/Creatinine Ratio: 18 (ref 9–23)
BUN: 17 mg/dL (ref 6–24)
Bilirubin Total: 0.3 mg/dL (ref 0.0–1.2)
CO2: 26 mmol/L (ref 20–29)
Calcium: 9.5 mg/dL (ref 8.7–10.2)
Chloride: 98 mmol/L (ref 96–106)
Creatinine, Ser: 0.96 mg/dL (ref 0.57–1.00)
Globulin, Total: 3.5 g/dL (ref 1.5–4.5)
Glucose: 83 mg/dL (ref 70–99)
Potassium: 3.8 mmol/L (ref 3.5–5.2)
Sodium: 137 mmol/L (ref 134–144)
Total Protein: 7.9 g/dL (ref 6.0–8.5)
eGFR: 69 mL/min/1.73 (ref >59)

## 2024-03-10 LAB — CBC WITH DIFFERENTIAL/PLATELET
Basophils Absolute: 0.1 x10E3/uL (ref 0.0–0.2)
Basos: 1 %
EOS (ABSOLUTE): 0.2 x10E3/uL (ref 0.0–0.4)
Eos: 3 %
Hematocrit: 42.3 % (ref 34.0–46.6)
Hemoglobin: 13.7 g/dL (ref 11.1–15.9)
Immature Grans (Abs): 0 x10E3/uL (ref 0.0–0.1)
Immature Granulocytes: 0 %
Lymphocytes Absolute: 3.8 x10E3/uL — ABNORMAL HIGH (ref 0.7–3.1)
Lymphs: 47 %
MCH: 28 pg (ref 26.6–33.0)
MCHC: 32.4 g/dL (ref 31.5–35.7)
MCV: 86 fL (ref 79–97)
Monocytes Absolute: 0.5 x10E3/uL (ref 0.1–0.9)
Monocytes: 6 %
Neutrophils Absolute: 3.4 x10E3/uL (ref 1.4–7.0)
Neutrophils: 43 %
Platelets: 342 x10E3/uL (ref 150–450)
RBC: 4.9 x10E6/uL (ref 3.77–5.28)
RDW: 15.1 % (ref 11.7–15.4)
WBC: 7.9 x10E3/uL (ref 3.4–10.8)

## 2024-03-10 LAB — MICROALBUMIN / CREATININE URINE RATIO
Creatinine, Urine: 36.4 mg/dL
Microalb/Creat Ratio: <8 mg/g{creat} (ref 0–29)
Microalbumin, Urine: <3 ug/mL

## 2024-03-10 LAB — HEPATITIS B SURFACE ANTIBODY,QUALITATIVE: Hep B Surface Ab, Qual: REACTIVE

## 2024-03-10 LAB — LIPID PANEL
Chol/HDL Ratio: 2.9 ratio (ref 0.0–4.4)
Cholesterol, Total: 164 mg/dL (ref 100–199)
HDL: 56 mg/dL (ref >39)
LDL Chol Calc (NIH): 85 mg/dL (ref 0–99)
Triglycerides: 133 mg/dL (ref 0–149)
VLDL Cholesterol Cal: 23 mg/dL (ref 5–40)

## 2024-03-10 LAB — HEMOGLOBIN A1C
Est. average glucose Bld gHb Est-mCnc: 148 mg/dL
Hgb A1c MFr Bld: 6.8 % — ABNORMAL HIGH (ref 4.8–5.6)

## 2024-03-10 LAB — VITAMIN D 25 HYDROXY (VIT D DEFICIENCY, FRACTURES): Vit D, 25-Hydroxy: 70 ng/mL (ref 30.0–100.0)

## 2024-03-12 ENCOUNTER — Other Ambulatory Visit (HOSPITAL_BASED_OUTPATIENT_CLINIC_OR_DEPARTMENT_OTHER): Payer: Self-pay

## 2024-03-14 ENCOUNTER — Other Ambulatory Visit

## 2024-03-15 ENCOUNTER — Other Ambulatory Visit: Payer: Self-pay | Admitting: Nurse Practitioner

## 2024-03-15 DIAGNOSIS — Z1231 Encounter for screening mammogram for malignant neoplasm of breast: Secondary | ICD-10-CM

## 2024-03-16 ENCOUNTER — Other Ambulatory Visit (HOSPITAL_BASED_OUTPATIENT_CLINIC_OR_DEPARTMENT_OTHER): Payer: Self-pay

## 2024-03-16 ENCOUNTER — Ambulatory Visit: Payer: Self-pay | Admitting: Nurse Practitioner

## 2024-03-16 ENCOUNTER — Ambulatory Visit
Admission: RE | Admit: 2024-03-16 | Discharge: 2024-03-16 | Disposition: A | Discharge status: Home / Self Care | Source: Ambulatory Visit | Attending: Nurse Practitioner | Admitting: Nurse Practitioner

## 2024-03-16 DIAGNOSIS — Z1231 Encounter for screening mammogram for malignant neoplasm of breast: Secondary | ICD-10-CM

## 2024-03-16 NOTE — Assessment & Plan Note (Signed)
 Managed with medication adjustments by psychiatrist. Discussed mental health challenges. - Encourage positive outlook and small steps towards improvement.

## 2024-03-16 NOTE — Assessment & Plan Note (Signed)
Will check cholesterol, continue statin, tolerating well.

## 2024-03-16 NOTE — Assessment & Plan Note (Signed)
 Will check vitamin D  level and supplement as needed.    Also encouraged to spend 15 minutes in the sun daily.

## 2024-03-16 NOTE — Patient Instructions (Signed)
 Health Maintenance  Topic Date Due   Hepatitis B Vaccine (1 of 3 - 19+ 3-dose series) Never done   Complete foot exam   02/25/2024   Breast Cancer Screening  03/04/2024   COVID-19 Vaccine (4 - 2025-26 season) 03/25/2024*   Eye exam for diabetics  06/01/2024   Hemoglobin A1C  09/07/2024   Yearly kidney function blood test for diabetes  03/09/2025   Yearly kidney health urinalysis for diabetes  03/09/2025   DTaP/Tdap/Td vaccine (2 - Td or Tdap) 05/30/2029   Colon Cancer Screening  08/04/2029   Pneumococcal Vaccine for age over 25  Completed   Flu Shot  Completed   Hepatitis C Screening  Completed   HIV Screening  Completed   Zoster (Shingles) Vaccine  Completed   HPV Vaccine  Aged Out   Meningitis B Vaccine  Aged Out  *Topic was postponed. The date shown is not the original due date.

## 2024-03-16 NOTE — Assessment & Plan Note (Signed)
 Continue levothyroxine , will check levels. Continue f/u with Endocrinology. She has had a recent change in her medications and taking vitamin d 

## 2024-03-16 NOTE — Assessment & Plan Note (Signed)
 Influenza vaccine administered Encouraged to take Tylenol as needed for fever or muscle aches.

## 2024-03-16 NOTE — Assessment & Plan Note (Signed)

## 2024-03-16 NOTE — Assessment & Plan Note (Signed)
 Current weight 207 lbs. Discussed weight loss challenges, role of physical activity and diet. - Encourage increased physical activity. - Discuss diet and weight management strategies.

## 2024-03-16 NOTE — Assessment & Plan Note (Addendum)
 Blood pressure is well controlled, continue f/u with cardiology EKG done with t abnormality HR 61.

## 2024-03-16 NOTE — Assessment & Plan Note (Signed)
 Managed with Ozempic  0.5 mg weekly. Experiences injection site pain and nausea. - Continue Ozempic  0.5 mg weekly. - Consider increasing Ozempic  dose if tolerated. - Discuss Rybelsus  as alternative. - Check A1c.

## 2024-03-28 ENCOUNTER — Encounter: Admitting: Nurse Practitioner

## 2024-04-10 MED FILL — Glucose Blood Test Strip: Qty: 100 | Fill #0 | Status: CN

## 2024-04-11 ENCOUNTER — Other Ambulatory Visit: Payer: Self-pay

## 2024-04-11 ENCOUNTER — Other Ambulatory Visit: Payer: Self-pay | Admitting: Nurse Practitioner

## 2024-04-11 ENCOUNTER — Other Ambulatory Visit (HOSPITAL_BASED_OUTPATIENT_CLINIC_OR_DEPARTMENT_OTHER): Payer: Self-pay

## 2024-04-11 MED ORDER — ACCU-CHEK GUIDE TEST VI STRP
ORAL_STRIP | 0 refills | Status: DC
  Filled 2024-04-11: qty 50, 50d supply, fill #0

## 2024-04-12 ENCOUNTER — Other Ambulatory Visit: Payer: Self-pay

## 2024-04-12 ENCOUNTER — Other Ambulatory Visit (HOSPITAL_BASED_OUTPATIENT_CLINIC_OR_DEPARTMENT_OTHER): Payer: Self-pay

## 2024-04-12 MED FILL — Atorvastatin Calcium Tab 20 MG (Base Equivalent): ORAL | 90 days supply | Qty: 90 | Fill #1 | Status: AC

## 2024-04-15 ENCOUNTER — Other Ambulatory Visit: Payer: Self-pay

## 2024-04-15 ENCOUNTER — Other Ambulatory Visit (HOSPITAL_BASED_OUTPATIENT_CLINIC_OR_DEPARTMENT_OTHER): Payer: Self-pay

## 2024-04-21 ENCOUNTER — Other Ambulatory Visit (HOSPITAL_BASED_OUTPATIENT_CLINIC_OR_DEPARTMENT_OTHER): Payer: Self-pay

## 2024-04-27 ENCOUNTER — Encounter: Payer: Self-pay | Admitting: Cardiovascular Disease

## 2024-04-27 ENCOUNTER — Encounter (HOSPITAL_BASED_OUTPATIENT_CLINIC_OR_DEPARTMENT_OTHER): Payer: Self-pay | Admitting: Cardiovascular Disease

## 2024-05-06 ENCOUNTER — Other Ambulatory Visit

## 2024-05-06 ENCOUNTER — Other Ambulatory Visit: Payer: Self-pay | Admitting: Internal Medicine

## 2024-05-06 DIAGNOSIS — E039 Hypothyroidism, unspecified: Secondary | ICD-10-CM

## 2024-05-07 LAB — TSH: TSH: 1.79 m[IU]/L (ref 0.40–4.50)

## 2024-05-07 LAB — T4, FREE: Free T4: 1.3 ng/dL (ref 0.8–1.8)

## 2024-05-09 ENCOUNTER — Other Ambulatory Visit: Payer: Self-pay

## 2024-05-09 ENCOUNTER — Ambulatory Visit: Payer: Self-pay | Admitting: Internal Medicine

## 2024-05-09 ENCOUNTER — Encounter: Payer: Self-pay | Admitting: Internal Medicine

## 2024-05-10 ENCOUNTER — Other Ambulatory Visit

## 2024-05-11 ENCOUNTER — Other Ambulatory Visit (HOSPITAL_BASED_OUTPATIENT_CLINIC_OR_DEPARTMENT_OTHER): Payer: Self-pay

## 2024-05-14 ENCOUNTER — Other Ambulatory Visit (HOSPITAL_BASED_OUTPATIENT_CLINIC_OR_DEPARTMENT_OTHER): Payer: Self-pay

## 2024-05-17 ENCOUNTER — Encounter: Payer: Self-pay | Admitting: Nurse Practitioner

## 2024-05-18 ENCOUNTER — Other Ambulatory Visit (HOSPITAL_BASED_OUTPATIENT_CLINIC_OR_DEPARTMENT_OTHER): Payer: Self-pay

## 2024-05-18 ENCOUNTER — Other Ambulatory Visit: Payer: Self-pay | Admitting: Nurse Practitioner

## 2024-05-18 MED ORDER — AZELASTINE-FLUTICASONE 137-50 MCG/ACT NA SUSP
2.0000 | Freq: Two times a day (BID) | NASAL | 2 refills | Status: AC | PRN
  Filled 2024-05-18: qty 23, 30d supply, fill #0

## 2024-05-21 ENCOUNTER — Other Ambulatory Visit (HOSPITAL_BASED_OUTPATIENT_CLINIC_OR_DEPARTMENT_OTHER): Payer: Self-pay

## 2024-05-23 ENCOUNTER — Encounter (HOSPITAL_BASED_OUTPATIENT_CLINIC_OR_DEPARTMENT_OTHER): Payer: Self-pay

## 2024-05-23 ENCOUNTER — Other Ambulatory Visit (HOSPITAL_BASED_OUTPATIENT_CLINIC_OR_DEPARTMENT_OTHER): Payer: Self-pay

## 2024-05-24 ENCOUNTER — Other Ambulatory Visit (HOSPITAL_BASED_OUTPATIENT_CLINIC_OR_DEPARTMENT_OTHER): Payer: Self-pay

## 2024-05-24 ENCOUNTER — Encounter: Payer: Self-pay | Admitting: Nurse Practitioner

## 2024-05-30 ENCOUNTER — Encounter (HOSPITAL_BASED_OUTPATIENT_CLINIC_OR_DEPARTMENT_OTHER): Payer: Self-pay

## 2024-05-30 ENCOUNTER — Other Ambulatory Visit (HOSPITAL_BASED_OUTPATIENT_CLINIC_OR_DEPARTMENT_OTHER): Payer: Self-pay

## 2024-05-30 ENCOUNTER — Encounter: Payer: Self-pay | Admitting: Family

## 2024-05-31 ENCOUNTER — Other Ambulatory Visit: Payer: Self-pay

## 2024-05-31 ENCOUNTER — Other Ambulatory Visit (HOSPITAL_BASED_OUTPATIENT_CLINIC_OR_DEPARTMENT_OTHER): Payer: Self-pay

## 2024-05-31 MED ORDER — CHLORDIAZEPOXIDE HCL 5 MG PO CAPS
15.0000 mg | ORAL_CAPSULE | Freq: Every day | ORAL | 1 refills | Status: AC
  Filled 2024-05-31: qty 120, 30d supply, fill #0

## 2024-06-01 ENCOUNTER — Other Ambulatory Visit (HOSPITAL_BASED_OUTPATIENT_CLINIC_OR_DEPARTMENT_OTHER): Payer: Self-pay

## 2024-06-06 ENCOUNTER — Other Ambulatory Visit (HOSPITAL_BASED_OUTPATIENT_CLINIC_OR_DEPARTMENT_OTHER): Payer: Self-pay

## 2024-06-06 ENCOUNTER — Encounter: Payer: Self-pay | Admitting: Family

## 2024-06-07 ENCOUNTER — Other Ambulatory Visit: Payer: Self-pay

## 2024-06-07 ENCOUNTER — Other Ambulatory Visit (HOSPITAL_BASED_OUTPATIENT_CLINIC_OR_DEPARTMENT_OTHER): Payer: Self-pay

## 2024-06-07 ENCOUNTER — Other Ambulatory Visit (HOSPITAL_BASED_OUTPATIENT_CLINIC_OR_DEPARTMENT_OTHER): Payer: Self-pay | Admitting: Cardiovascular Disease

## 2024-06-07 MED ORDER — HYDROCHLOROTHIAZIDE 25 MG PO TABS
25.0000 mg | ORAL_TABLET | Freq: Every day | ORAL | 0 refills | Status: AC
  Filled 2024-06-07: qty 30, 30d supply, fill #0

## 2024-06-08 ENCOUNTER — Other Ambulatory Visit: Payer: Self-pay | Admitting: Nurse Practitioner

## 2024-06-09 ENCOUNTER — Other Ambulatory Visit (HOSPITAL_BASED_OUTPATIENT_CLINIC_OR_DEPARTMENT_OTHER): Payer: Self-pay

## 2024-06-09 ENCOUNTER — Other Ambulatory Visit: Payer: Self-pay

## 2024-06-09 ENCOUNTER — Encounter: Payer: Self-pay | Admitting: Nurse Practitioner

## 2024-06-09 ENCOUNTER — Other Ambulatory Visit (HOSPITAL_COMMUNITY): Payer: Self-pay

## 2024-06-09 MED ORDER — ACCU-CHEK GUIDE TEST VI STRP
ORAL_STRIP | 0 refills | Status: DC
  Filled 2024-06-09: qty 50, fill #0

## 2024-06-09 MED ORDER — ACCU-CHEK GUIDE TEST VI STRP
ORAL_STRIP | 2 refills | Status: AC
  Filled 2024-06-09: qty 50, 50d supply, fill #0

## 2024-06-28 ENCOUNTER — Encounter: Admitting: Nurse Practitioner

## 2024-07-12 ENCOUNTER — Ambulatory Visit: Payer: Self-pay | Admitting: Nurse Practitioner

## 2025-02-24 ENCOUNTER — Ambulatory Visit: Admitting: Internal Medicine

## 2025-03-13 ENCOUNTER — Encounter: Payer: Self-pay | Admitting: Nurse Practitioner
